# Patient Record
Sex: Female | Born: 1945 | Race: White | Hispanic: No | Smoking: Former smoker
Health system: Southern US, Community
[De-identification: ages and names within clinical notes are randomized; demographics above are authoritative.]

## PROBLEM LIST (undated history)

## (undated) DIAGNOSIS — G459 Transient cerebral ischemic attack, unspecified: Secondary | ICD-10-CM

## (undated) DIAGNOSIS — Z9289 Personal history of other medical treatment: Secondary | ICD-10-CM

## (undated) DIAGNOSIS — M1990 Unspecified osteoarthritis, unspecified site: Secondary | ICD-10-CM

## (undated) DIAGNOSIS — I639 Cerebral infarction, unspecified: Secondary | ICD-10-CM

## (undated) DIAGNOSIS — K3184 Gastroparesis: Secondary | ICD-10-CM

## (undated) DIAGNOSIS — I5032 Chronic diastolic (congestive) heart failure: Secondary | ICD-10-CM

## (undated) DIAGNOSIS — I10 Essential (primary) hypertension: Secondary | ICD-10-CM

## (undated) DIAGNOSIS — F329 Major depressive disorder, single episode, unspecified: Secondary | ICD-10-CM

## (undated) DIAGNOSIS — F419 Anxiety disorder, unspecified: Secondary | ICD-10-CM

## (undated) DIAGNOSIS — F410 Panic disorder [episodic paroxysmal anxiety] without agoraphobia: Secondary | ICD-10-CM

## (undated) DIAGNOSIS — Z8719 Personal history of other diseases of the digestive system: Secondary | ICD-10-CM

## (undated) DIAGNOSIS — K222 Esophageal obstruction: Secondary | ICD-10-CM

## (undated) DIAGNOSIS — E785 Hyperlipidemia, unspecified: Secondary | ICD-10-CM

## (undated) DIAGNOSIS — G4730 Sleep apnea, unspecified: Secondary | ICD-10-CM

## (undated) DIAGNOSIS — N19 Unspecified kidney failure: Secondary | ICD-10-CM

## (undated) DIAGNOSIS — K862 Cyst of pancreas: Secondary | ICD-10-CM

## (undated) DIAGNOSIS — I509 Heart failure, unspecified: Secondary | ICD-10-CM

## (undated) DIAGNOSIS — K219 Gastro-esophageal reflux disease without esophagitis: Secondary | ICD-10-CM

## (undated) DIAGNOSIS — I272 Pulmonary hypertension, unspecified: Secondary | ICD-10-CM

## (undated) DIAGNOSIS — L570 Actinic keratosis: Secondary | ICD-10-CM

## (undated) DIAGNOSIS — K5720 Diverticulitis of large intestine with perforation and abscess without bleeding: Secondary | ICD-10-CM

## (undated) DIAGNOSIS — F32A Depression, unspecified: Secondary | ICD-10-CM

## (undated) DIAGNOSIS — K2270 Barrett's esophagus without dysplasia: Secondary | ICD-10-CM

## (undated) DIAGNOSIS — H3530 Unspecified macular degeneration: Secondary | ICD-10-CM

## (undated) DIAGNOSIS — K8689 Other specified diseases of pancreas: Secondary | ICD-10-CM

## (undated) DIAGNOSIS — C801 Malignant (primary) neoplasm, unspecified: Secondary | ICD-10-CM

## (undated) HISTORY — DX: Depression, unspecified: F32.A

## (undated) HISTORY — DX: Cerebral infarction, unspecified: I63.9

## (undated) HISTORY — DX: Chronic diastolic (congestive) heart failure: I50.32

## (undated) HISTORY — DX: Cyst of pancreas: K86.2

## (undated) HISTORY — DX: Major depressive disorder, single episode, unspecified: F32.9

## (undated) HISTORY — DX: Anxiety disorder, unspecified: F41.9

## (undated) HISTORY — DX: Barrett's esophagus without dysplasia: K22.70

## (undated) HISTORY — DX: Hyperlipidemia, unspecified: E78.5

## (undated) HISTORY — DX: Personal history of other medical treatment: Z92.89

## (undated) HISTORY — DX: Gastro-esophageal reflux disease without esophagitis: K21.9

## (undated) HISTORY — DX: Gastroparesis: K31.84

## (undated) HISTORY — PX: OTHER SURGICAL HISTORY: SHX169

## (undated) HISTORY — DX: Pulmonary hypertension, unspecified: I27.20

## (undated) HISTORY — DX: Actinic keratosis: L57.0

## (undated) HISTORY — DX: Panic disorder (episodic paroxysmal anxiety): F41.0

## (undated) HISTORY — PX: APPENDECTOMY: SHX54

## (undated) HISTORY — DX: Unspecified kidney failure: N19

## (undated) HISTORY — DX: Esophageal obstruction: K22.2

## (undated) HISTORY — DX: Unspecified macular degeneration: H35.30

## (undated) HISTORY — PX: ESOPHAGEAL DILATION: SHX303

## (undated) HISTORY — DX: Transient cerebral ischemic attack, unspecified: G45.9

---

## 1982-02-20 HISTORY — PX: KIDNEY TRANSPLANT: SHX239

## 1997-07-17 ENCOUNTER — Ambulatory Visit (HOSPITAL_COMMUNITY): Admission: RE | Admit: 1997-07-17 | Discharge: 1997-07-17 | Payer: Self-pay | Admitting: Family Medicine

## 1997-09-15 ENCOUNTER — Other Ambulatory Visit: Admission: RE | Admit: 1997-09-15 | Discharge: 1997-09-15 | Payer: Self-pay | Admitting: *Deleted

## 1997-10-22 ENCOUNTER — Emergency Department (HOSPITAL_COMMUNITY): Admission: EM | Admit: 1997-10-22 | Discharge: 1997-10-22 | Payer: Self-pay | Admitting: Emergency Medicine

## 1998-03-26 ENCOUNTER — Encounter: Payer: Self-pay | Admitting: Emergency Medicine

## 1998-03-26 ENCOUNTER — Emergency Department (HOSPITAL_COMMUNITY): Admission: EM | Admit: 1998-03-26 | Discharge: 1998-03-26 | Payer: Self-pay | Admitting: Emergency Medicine

## 1998-06-02 ENCOUNTER — Other Ambulatory Visit: Admission: RE | Admit: 1998-06-02 | Discharge: 1998-06-02 | Payer: Self-pay | Admitting: Family Medicine

## 1998-10-28 ENCOUNTER — Ambulatory Visit (HOSPITAL_COMMUNITY): Admission: RE | Admit: 1998-10-28 | Discharge: 1998-10-28 | Payer: Self-pay | Admitting: *Deleted

## 1998-12-09 ENCOUNTER — Ambulatory Visit (HOSPITAL_COMMUNITY): Admission: RE | Admit: 1998-12-09 | Discharge: 1998-12-09 | Payer: Self-pay | Admitting: Family Medicine

## 1999-08-25 ENCOUNTER — Ambulatory Visit (HOSPITAL_COMMUNITY): Admission: RE | Admit: 1999-08-25 | Discharge: 1999-08-25 | Payer: Self-pay | Admitting: Nephrology

## 1999-08-25 ENCOUNTER — Encounter: Payer: Self-pay | Admitting: Nephrology

## 1999-11-09 ENCOUNTER — Ambulatory Visit (HOSPITAL_COMMUNITY): Admission: RE | Admit: 1999-11-09 | Discharge: 1999-11-09 | Payer: Self-pay | Admitting: Cardiology

## 1999-11-09 ENCOUNTER — Encounter: Payer: Self-pay | Admitting: Cardiology

## 2000-10-26 ENCOUNTER — Encounter: Payer: Self-pay | Admitting: Family Medicine

## 2000-10-26 ENCOUNTER — Ambulatory Visit (HOSPITAL_COMMUNITY): Admission: RE | Admit: 2000-10-26 | Discharge: 2000-10-26 | Payer: Self-pay | Admitting: Family Medicine

## 2001-03-23 ENCOUNTER — Encounter (INDEPENDENT_AMBULATORY_CARE_PROVIDER_SITE_OTHER): Payer: Self-pay | Admitting: Specialist

## 2001-03-23 ENCOUNTER — Encounter: Payer: Self-pay | Admitting: Emergency Medicine

## 2001-03-23 ENCOUNTER — Inpatient Hospital Stay (HOSPITAL_COMMUNITY): Admission: EM | Admit: 2001-03-23 | Discharge: 2001-03-25 | Payer: Self-pay

## 2001-04-16 ENCOUNTER — Ambulatory Visit (HOSPITAL_COMMUNITY): Admission: RE | Admit: 2001-04-16 | Discharge: 2001-04-16 | Payer: Self-pay | Admitting: General Surgery

## 2001-04-16 ENCOUNTER — Encounter: Payer: Self-pay | Admitting: General Surgery

## 2001-05-27 ENCOUNTER — Ambulatory Visit (HOSPITAL_COMMUNITY): Admission: RE | Admit: 2001-05-27 | Discharge: 2001-05-27 | Payer: Self-pay | Admitting: Family Medicine

## 2001-05-27 ENCOUNTER — Encounter: Payer: Self-pay | Admitting: Gastroenterology

## 2001-06-04 ENCOUNTER — Inpatient Hospital Stay (HOSPITAL_COMMUNITY): Admission: AD | Admit: 2001-06-04 | Discharge: 2001-06-10 | Payer: Self-pay | Admitting: Nephrology

## 2001-06-04 ENCOUNTER — Encounter: Payer: Self-pay | Admitting: Nephrology

## 2001-07-19 ENCOUNTER — Ambulatory Visit (HOSPITAL_COMMUNITY): Admission: RE | Admit: 2001-07-19 | Discharge: 2001-07-19 | Payer: Self-pay | Admitting: Family Medicine

## 2001-09-09 ENCOUNTER — Ambulatory Visit (HOSPITAL_COMMUNITY): Admission: RE | Admit: 2001-09-09 | Discharge: 2001-09-09 | Payer: Self-pay | Admitting: Gastroenterology

## 2001-09-09 ENCOUNTER — Encounter: Payer: Self-pay | Admitting: Gastroenterology

## 2001-10-10 ENCOUNTER — Ambulatory Visit: Admission: RE | Admit: 2001-10-10 | Discharge: 2001-10-10 | Payer: Self-pay | Admitting: Internal Medicine

## 2001-10-13 ENCOUNTER — Emergency Department (HOSPITAL_COMMUNITY): Admission: EM | Admit: 2001-10-13 | Discharge: 2001-10-14 | Payer: Self-pay | Admitting: Emergency Medicine

## 2001-10-14 ENCOUNTER — Encounter: Payer: Self-pay | Admitting: Emergency Medicine

## 2001-10-16 ENCOUNTER — Ambulatory Visit (HOSPITAL_COMMUNITY): Admission: RE | Admit: 2001-10-16 | Discharge: 2001-10-16 | Payer: Self-pay | Admitting: Internal Medicine

## 2001-11-21 ENCOUNTER — Encounter: Payer: Self-pay | Admitting: Nephrology

## 2001-11-21 ENCOUNTER — Inpatient Hospital Stay (HOSPITAL_COMMUNITY): Admission: AD | Admit: 2001-11-21 | Discharge: 2001-11-23 | Payer: Self-pay | Admitting: Nephrology

## 2001-12-09 ENCOUNTER — Encounter: Payer: Self-pay | Admitting: Nephrology

## 2001-12-09 ENCOUNTER — Encounter: Admission: RE | Admit: 2001-12-09 | Discharge: 2001-12-09 | Payer: Self-pay | Admitting: Nephrology

## 2002-01-07 ENCOUNTER — Other Ambulatory Visit: Admission: RE | Admit: 2002-01-07 | Discharge: 2002-01-07 | Payer: Self-pay | Admitting: Family Medicine

## 2002-04-04 ENCOUNTER — Ambulatory Visit (HOSPITAL_COMMUNITY): Admission: RE | Admit: 2002-04-04 | Discharge: 2002-04-04 | Payer: Self-pay | Admitting: Gastroenterology

## 2002-04-04 ENCOUNTER — Encounter: Payer: Self-pay | Admitting: Gastroenterology

## 2002-06-11 ENCOUNTER — Encounter: Admission: RE | Admit: 2002-06-11 | Discharge: 2002-06-11 | Payer: Self-pay | Admitting: Nephrology

## 2002-06-11 ENCOUNTER — Encounter: Payer: Self-pay | Admitting: Nephrology

## 2002-09-24 ENCOUNTER — Encounter: Payer: Self-pay | Admitting: Nephrology

## 2002-09-24 ENCOUNTER — Encounter: Admission: RE | Admit: 2002-09-24 | Discharge: 2002-09-24 | Payer: Self-pay | Admitting: Nephrology

## 2002-10-07 ENCOUNTER — Encounter: Payer: Self-pay | Admitting: Gastroenterology

## 2002-10-07 ENCOUNTER — Ambulatory Visit (HOSPITAL_COMMUNITY): Admission: RE | Admit: 2002-10-07 | Discharge: 2002-10-07 | Payer: Self-pay | Admitting: Gastroenterology

## 2002-11-01 ENCOUNTER — Encounter: Payer: Self-pay | Admitting: Nephrology

## 2002-11-01 ENCOUNTER — Ambulatory Visit (HOSPITAL_COMMUNITY): Admission: RE | Admit: 2002-11-01 | Discharge: 2002-11-01 | Payer: Self-pay | Admitting: Nephrology

## 2002-12-08 ENCOUNTER — Encounter: Payer: Self-pay | Admitting: Occupational Therapy

## 2002-12-08 ENCOUNTER — Ambulatory Visit (HOSPITAL_COMMUNITY): Admission: RE | Admit: 2002-12-08 | Discharge: 2002-12-08 | Payer: Self-pay | Admitting: Family Medicine

## 2003-01-24 ENCOUNTER — Observation Stay (HOSPITAL_COMMUNITY): Admission: EM | Admit: 2003-01-24 | Discharge: 2003-01-24 | Payer: Self-pay | Admitting: Emergency Medicine

## 2003-02-27 ENCOUNTER — Encounter: Admission: RE | Admit: 2003-02-27 | Discharge: 2003-05-28 | Payer: Self-pay | Admitting: Orthopedic Surgery

## 2003-04-15 ENCOUNTER — Ambulatory Visit (HOSPITAL_COMMUNITY): Admission: RE | Admit: 2003-04-15 | Discharge: 2003-04-15 | Payer: Self-pay | Admitting: Gastroenterology

## 2003-05-07 ENCOUNTER — Encounter: Payer: Self-pay | Admitting: Gastroenterology

## 2003-09-16 ENCOUNTER — Ambulatory Visit (HOSPITAL_BASED_OUTPATIENT_CLINIC_OR_DEPARTMENT_OTHER): Admission: RE | Admit: 2003-09-16 | Discharge: 2003-09-16 | Payer: Self-pay | Admitting: Orthopedic Surgery

## 2003-09-16 ENCOUNTER — Ambulatory Visit (HOSPITAL_COMMUNITY): Admission: RE | Admit: 2003-09-16 | Discharge: 2003-09-16 | Payer: Self-pay | Admitting: Orthopedic Surgery

## 2003-12-14 ENCOUNTER — Ambulatory Visit: Payer: Self-pay | Admitting: Family Medicine

## 2003-12-24 ENCOUNTER — Ambulatory Visit (HOSPITAL_COMMUNITY): Admission: RE | Admit: 2003-12-24 | Discharge: 2003-12-24 | Payer: Self-pay | Admitting: Family Medicine

## 2003-12-28 ENCOUNTER — Ambulatory Visit: Payer: Self-pay | Admitting: Family Medicine

## 2004-02-26 ENCOUNTER — Ambulatory Visit: Payer: Self-pay | Admitting: Internal Medicine

## 2004-04-19 ENCOUNTER — Ambulatory Visit (HOSPITAL_COMMUNITY): Admission: RE | Admit: 2004-04-19 | Discharge: 2004-04-19 | Payer: Self-pay | Admitting: Gastroenterology

## 2004-05-12 ENCOUNTER — Ambulatory Visit: Payer: Self-pay | Admitting: Gastroenterology

## 2004-05-25 ENCOUNTER — Ambulatory Visit: Payer: Self-pay | Admitting: Gastroenterology

## 2004-06-16 ENCOUNTER — Ambulatory Visit: Payer: Self-pay | Admitting: Gastroenterology

## 2004-10-16 ENCOUNTER — Encounter: Admission: RE | Admit: 2004-10-16 | Discharge: 2004-10-16 | Payer: Self-pay | Admitting: Nephrology

## 2004-10-28 ENCOUNTER — Ambulatory Visit (HOSPITAL_COMMUNITY): Admission: RE | Admit: 2004-10-28 | Discharge: 2004-10-28 | Payer: Self-pay | Admitting: Nephrology

## 2004-12-15 ENCOUNTER — Encounter: Admission: RE | Admit: 2004-12-15 | Discharge: 2004-12-15 | Payer: Self-pay | Admitting: Neurology

## 2005-01-23 ENCOUNTER — Ambulatory Visit (HOSPITAL_COMMUNITY): Admission: RE | Admit: 2005-01-23 | Discharge: 2005-01-23 | Payer: Self-pay | Admitting: Family Medicine

## 2005-04-18 ENCOUNTER — Ambulatory Visit: Payer: Self-pay | Admitting: Family Medicine

## 2005-04-27 ENCOUNTER — Ambulatory Visit: Payer: Self-pay | Admitting: Gastroenterology

## 2005-05-09 ENCOUNTER — Ambulatory Visit: Payer: Self-pay | Admitting: Gastroenterology

## 2005-05-09 ENCOUNTER — Encounter (INDEPENDENT_AMBULATORY_CARE_PROVIDER_SITE_OTHER): Payer: Self-pay | Admitting: *Deleted

## 2006-02-05 ENCOUNTER — Ambulatory Visit (HOSPITAL_COMMUNITY): Admission: RE | Admit: 2006-02-05 | Discharge: 2006-02-05 | Payer: Self-pay | Admitting: Nephrology

## 2007-02-06 ENCOUNTER — Encounter: Admission: RE | Admit: 2007-02-06 | Discharge: 2007-02-06 | Payer: Self-pay | Admitting: Nephrology

## 2007-02-06 ENCOUNTER — Encounter: Payer: Self-pay | Admitting: Gastroenterology

## 2007-02-22 ENCOUNTER — Ambulatory Visit (HOSPITAL_COMMUNITY): Admission: RE | Admit: 2007-02-22 | Discharge: 2007-02-22 | Payer: Self-pay | Admitting: Nephrology

## 2007-03-04 ENCOUNTER — Ambulatory Visit: Payer: Self-pay | Admitting: Gastroenterology

## 2007-03-04 LAB — CONVERTED CEMR LAB: CEA: 12.4 ng/mL — ABNORMAL HIGH (ref 0.0–5.0)

## 2007-03-07 ENCOUNTER — Ambulatory Visit: Payer: Self-pay | Admitting: Gastroenterology

## 2007-03-07 ENCOUNTER — Encounter: Payer: Self-pay | Admitting: Gastroenterology

## 2007-03-21 ENCOUNTER — Ambulatory Visit (HOSPITAL_COMMUNITY): Admission: RE | Admit: 2007-03-21 | Discharge: 2007-03-21 | Payer: Self-pay | Admitting: Gastroenterology

## 2007-03-21 ENCOUNTER — Encounter: Payer: Self-pay | Admitting: Gastroenterology

## 2007-03-27 ENCOUNTER — Ambulatory Visit: Payer: Self-pay | Admitting: Gastroenterology

## 2007-04-24 ENCOUNTER — Ambulatory Visit: Payer: Self-pay | Admitting: Gastroenterology

## 2007-09-04 ENCOUNTER — Encounter: Admission: RE | Admit: 2007-09-04 | Discharge: 2007-09-04 | Payer: Self-pay | Admitting: Nephrology

## 2008-01-04 ENCOUNTER — Encounter: Admission: RE | Admit: 2008-01-04 | Discharge: 2008-01-04 | Payer: Self-pay | Admitting: Neurology

## 2008-01-07 ENCOUNTER — Encounter: Admission: RE | Admit: 2008-01-07 | Discharge: 2008-02-05 | Payer: Self-pay | Admitting: Neurology

## 2008-01-21 ENCOUNTER — Telehealth: Payer: Self-pay | Admitting: Gastroenterology

## 2008-01-22 DIAGNOSIS — D490 Neoplasm of unspecified behavior of digestive system: Secondary | ICD-10-CM

## 2008-02-21 HISTORY — PX: COLON RESECTION: SHX5231

## 2008-02-26 ENCOUNTER — Encounter
Admission: RE | Admit: 2008-02-26 | Discharge: 2008-02-26 | Payer: Self-pay | Admitting: Physical Medicine and Rehabilitation

## 2008-02-28 ENCOUNTER — Ambulatory Visit: Payer: Self-pay | Admitting: Cardiology

## 2008-04-15 ENCOUNTER — Encounter (INDEPENDENT_AMBULATORY_CARE_PROVIDER_SITE_OTHER): Payer: Self-pay | Admitting: *Deleted

## 2008-05-06 ENCOUNTER — Ambulatory Visit: Payer: Self-pay | Admitting: Gastroenterology

## 2008-05-06 DIAGNOSIS — I6350 Cerebral infarction due to unspecified occlusion or stenosis of unspecified cerebral artery: Secondary | ICD-10-CM | POA: Insufficient documentation

## 2008-05-06 DIAGNOSIS — K2270 Barrett's esophagus without dysplasia: Secondary | ICD-10-CM | POA: Insufficient documentation

## 2008-05-06 DIAGNOSIS — R110 Nausea: Secondary | ICD-10-CM

## 2008-05-07 ENCOUNTER — Telehealth: Payer: Self-pay | Admitting: Gastroenterology

## 2008-05-12 ENCOUNTER — Encounter: Payer: Self-pay | Admitting: Gastroenterology

## 2008-05-19 ENCOUNTER — Ambulatory Visit (HOSPITAL_COMMUNITY): Admission: RE | Admit: 2008-05-19 | Discharge: 2008-05-19 | Payer: Self-pay | Admitting: Gastroenterology

## 2008-05-21 ENCOUNTER — Ambulatory Visit: Payer: Self-pay | Admitting: Gastroenterology

## 2008-05-21 ENCOUNTER — Encounter: Payer: Self-pay | Admitting: Gastroenterology

## 2008-05-27 ENCOUNTER — Encounter: Payer: Self-pay | Admitting: Gastroenterology

## 2008-06-11 ENCOUNTER — Telehealth: Payer: Self-pay | Admitting: Gastroenterology

## 2008-06-24 ENCOUNTER — Ambulatory Visit: Payer: Self-pay | Admitting: Gastroenterology

## 2008-06-24 DIAGNOSIS — K3184 Gastroparesis: Secondary | ICD-10-CM

## 2008-06-25 ENCOUNTER — Ambulatory Visit (HOSPITAL_COMMUNITY): Admission: RE | Admit: 2008-06-25 | Discharge: 2008-06-25 | Payer: Self-pay | Admitting: Nephrology

## 2008-06-30 ENCOUNTER — Encounter: Admission: RE | Admit: 2008-06-30 | Discharge: 2008-06-30 | Payer: Self-pay | Admitting: Nephrology

## 2008-08-10 ENCOUNTER — Encounter: Payer: Self-pay | Admitting: Gastroenterology

## 2008-09-05 ENCOUNTER — Inpatient Hospital Stay (HOSPITAL_COMMUNITY): Admission: EM | Admit: 2008-09-05 | Discharge: 2008-09-13 | Payer: Self-pay | Admitting: Emergency Medicine

## 2008-09-05 ENCOUNTER — Encounter (INDEPENDENT_AMBULATORY_CARE_PROVIDER_SITE_OTHER): Payer: Self-pay | Admitting: General Surgery

## 2008-10-13 ENCOUNTER — Encounter: Payer: Self-pay | Admitting: Gastroenterology

## 2009-02-08 ENCOUNTER — Encounter: Payer: Self-pay | Admitting: Gastroenterology

## 2009-02-20 HISTORY — PX: COLOSTOMY: SHX63

## 2009-03-05 ENCOUNTER — Encounter (INDEPENDENT_AMBULATORY_CARE_PROVIDER_SITE_OTHER): Payer: Self-pay | Admitting: General Surgery

## 2009-03-05 ENCOUNTER — Inpatient Hospital Stay (HOSPITAL_COMMUNITY): Admission: RE | Admit: 2009-03-05 | Discharge: 2009-03-09 | Payer: Self-pay | Admitting: General Surgery

## 2009-03-12 ENCOUNTER — Encounter: Payer: Self-pay | Admitting: Gastroenterology

## 2009-04-05 ENCOUNTER — Encounter: Payer: Self-pay | Admitting: Gastroenterology

## 2009-04-06 ENCOUNTER — Telehealth: Payer: Self-pay | Admitting: Gastroenterology

## 2009-04-20 ENCOUNTER — Encounter: Payer: Self-pay | Admitting: Gastroenterology

## 2009-04-22 ENCOUNTER — Telehealth: Payer: Self-pay | Admitting: Gastroenterology

## 2009-04-23 ENCOUNTER — Ambulatory Visit: Payer: Self-pay | Admitting: Cardiology

## 2009-04-23 ENCOUNTER — Ambulatory Visit: Payer: Self-pay | Admitting: Gastroenterology

## 2009-04-23 DIAGNOSIS — K219 Gastro-esophageal reflux disease without esophagitis: Secondary | ICD-10-CM | POA: Insufficient documentation

## 2009-04-23 DIAGNOSIS — R109 Unspecified abdominal pain: Secondary | ICD-10-CM

## 2009-04-23 DIAGNOSIS — R197 Diarrhea, unspecified: Secondary | ICD-10-CM | POA: Insufficient documentation

## 2009-04-23 DIAGNOSIS — R634 Abnormal weight loss: Secondary | ICD-10-CM

## 2009-04-26 ENCOUNTER — Telehealth: Payer: Self-pay | Admitting: Nurse Practitioner

## 2009-04-27 ENCOUNTER — Encounter: Payer: Self-pay | Admitting: Nurse Practitioner

## 2009-04-28 ENCOUNTER — Encounter: Payer: Self-pay | Admitting: Nurse Practitioner

## 2009-04-29 ENCOUNTER — Encounter: Payer: Self-pay | Admitting: Gastroenterology

## 2009-04-30 ENCOUNTER — Encounter: Admission: RE | Admit: 2009-04-30 | Discharge: 2009-04-30 | Payer: Self-pay | Admitting: General Surgery

## 2009-05-05 ENCOUNTER — Ambulatory Visit: Payer: Self-pay | Admitting: Gastroenterology

## 2009-05-05 DIAGNOSIS — K5732 Diverticulitis of large intestine without perforation or abscess without bleeding: Secondary | ICD-10-CM | POA: Insufficient documentation

## 2009-06-28 ENCOUNTER — Encounter: Admission: RE | Admit: 2009-06-28 | Discharge: 2009-06-28 | Payer: Self-pay | Admitting: Nephrology

## 2009-07-30 ENCOUNTER — Telehealth: Payer: Self-pay | Admitting: Nurse Practitioner

## 2009-08-20 ENCOUNTER — Encounter: Payer: Self-pay | Admitting: Gastroenterology

## 2009-11-21 ENCOUNTER — Encounter: Payer: Self-pay | Admitting: Gastroenterology

## 2009-12-22 ENCOUNTER — Encounter: Payer: Self-pay | Admitting: Gastroenterology

## 2010-03-12 ENCOUNTER — Encounter: Payer: Self-pay | Admitting: Gastroenterology

## 2010-03-13 ENCOUNTER — Encounter: Payer: Self-pay | Admitting: Nephrology

## 2010-03-14 ENCOUNTER — Encounter: Payer: Self-pay | Admitting: Nephrology

## 2010-03-24 NOTE — Progress Notes (Signed)
Summary: Omeprazole approved for 1 year  Phone Note Refill Request Call back at Home Phone [phone] Message from:  Fax from Pharmacy on April 06, 2009 9:20 AM  PT APPROVED FPR OMEPRAZOLE  EFFECTIVE DATE 04/05/2009 TO 04/05/2010  FROM [facility].  SENT FORMS AND APPROVAL TO BE SCANNED IN  Initial call taken by: Merri Ray CMA (AAMA),  April 06, 2009 9:21 AM

## 2010-03-24 NOTE — Letter (Signed)
Summary: [facility]   Imported By: Sherian Rein 05/10/2009 14:04:34  _____________________________________________________________________  External Attachment:    Type:   Image     Comment:   External Document

## 2010-03-24 NOTE — Assessment & Plan Note (Signed)
Summary: F/U  CT ABD, ABD pain, saw NP   History of Present Illness Visit Type: Follow-up Visit Primary GI MD: Melvia Heaps MD [facility] Primary Provider: Elvis Coil, MD Requesting Provider: n/a Chief Complaint: F/u for CT and abd pain. Pt states that she feels better but still having lower abd pain  History of Present Illness:   Morgan Cooper has returned for followup of her lower abdominal pain and diarrhea.  She was seen on April 22, 2009 because of fever,  diarrhea and lower abdomina lpain.  She was placed on Cipro with resolution of the diarrhea and significant improvement of her pain.  CT Scan showed minimal increase in the size of the pancreatic cystic neoplasm.  There was mild sigmoid wall thickening and inflammatory stranding.  At this time she is feeling well except for very mild intermittent left lower quadrant pain.   GI Review of Systems    Reports abdominal pain.     Location of  Abdominal pain: lower abdomen.    Denies acid reflux, belching, bloating, chest pain, dysphagia with liquids, dysphagia with solids, heartburn, loss of appetite, nausea, vomiting, vomiting blood, weight loss, and  weight gain.        Denies anal fissure, black tarry stools, change in bowel habit, constipation, diarrhea, diverticulosis, fecal incontinence, heme positive stool, hemorrhoids, irritable bowel syndrome, jaundice, light color stool, liver problems, rectal bleeding, and  rectal pain.    Current Medications (verified): 1)  Azathioprine 50 Mg Tabs (Azathioprine) .... Take 1 Daily 2)  Prednisone 5 Mg Tabs (Prednisone) .... One and 1/2 Tablet By Mouth Once Daily 3)  Prozac 20 Mg Caps (Fluoxetine Hcl) .... Take 2 Daily 4)  Lipitor 20 Mg Tabs (Atorvastatin Calcium) .... Take 1 Daily 5)  Metoprolol Tartrate 50 Mg Tabs (Metoprolol Tartrate) .... Take 1/2 Tab Two Times A Day 6)  Doxepin Hcl 25 Mg Caps (Doxepin Hcl) .... Take 4 Daily 7)  Lasix 40 Mg Tabs (Furosemide) .... 1/2 Twice Daily 8)  Verapamil Hcl  Cr 120 Mg Xr24h-Cap (Verapamil Hcl) .... Take 1 Daily 9)  Fosamax 70 Mg Tabs (Alendronate Sodium) .... Take 1 Tab Weekly (Holdiing Since July) 10)  Aspirin Ec 325 Mg Tbec (Aspirin) .... Once Daily 11)  Restasis 0.05 % Emul (Cyclosporine) .... Use As Directed 12)  Lac-Hydrin 12 % Lotn (Ammonium Lactate) .... Use As Directed 13)  Ocuvite  Tabs (Multiple Vitamins-Minerals) .... Once Daily 14)  Bentyl 10 Mg Caps (Dicyclomine Hcl) .... Take 2 Times Daily 15)  Nexium 40 Mg Cpdr (Esomeprazole Magnesium) .... Take 1 Cap 30 Min Before Breakfast  Allergies (verified): No Known Drug Allergies  Past History:  Past Medical History: Reviewed history from 04/23/2009 and no changes required. Pancreatic Serous Cystadenoma Barrett's Esophagus Esophageal Stricture Renal Failiure - S/P transplant CVA - "minny strokes" Diverticulitis  Past Surgical History: Reviewed history from 04/23/2009 and no changes required. Renal Transplant Appendectomy Broken Arm Colon Resection with Colostomy 08/2008 Colostomy reversal January 2011  Family History: Reviewed history from 04/23/2009 and no changes required. Family History of Colon Polyps:Patient Family History of Diabetes: Sister Family History of Kidney Disease:Patient No FH of Colon Cancer:  Social History: Reviewed history from 05/06/2008 and no changes required. Occupation: Retired Patient is a former smoker.  Alcohol Use - no Daily Caffeine Use-Coffee daily Illicit Drug Use - no Patient does not get regular exercise.   Review of Systems  The patient denies allergy/sinus, anemia, anxiety-new, arthritis/joint pain, back pain, blood in urine, breast changes/lumps,  change in vision, confusion, cough, coughing up blood, depression-new, fainting, fatigue, fever, headaches-new, hearing problems, heart murmur, heart rhythm changes, itching, menstrual pain, muscle pains/cramps, night sweats, nosebleeds, pregnancy symptoms, shortness of breath, skin  rash, sleeping problems, sore throat, swelling of feet/legs, swollen lymph glands, thirst - excessive , urination - excessive , urination changes/pain, urine leakage, vision changes, and voice change.    Vital Signs:  Patient profile:   65 year old female Height:      65 inches Weight:      129 pounds BMI:     21.54 BSA:     1.64 Pulse rate:   60 / minute Pulse rhythm:   regular BP sitting:   120 / 64  (left arm) Cuff size:   regular  Vitals Entered By: Ok Anis CMA (May 05, 2009 10:22 AM)  Physical Exam  Additional Exam:  She is a thin female in no acute distress  Abdomen is without masses, tenderness or organomegaly   Impression & Recommendations:  Problem # 1:  DIARRHEA-PRESUMED INFECTIOUS (ICD-009.3) Assessment Improved  Problem # 2:  ABDOMINAL PAIN-MULTIPLE SITES (ICD-789.09) Assessment: Improved There is evidence for mild inflammation in the area the sigmoid colon by recent CT raising the question of possible low-grade diverticulitis or perhaps ischemic colitis.  This problem does appear to be resolving.  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  Plan follow CT in one year  Problem # 6:  DIVERTICULITIS, COLON, WITH PERFORATION (ICD-562.11) history of acute diverticulitis complicated by perforation in July, 2010.  Diverting colostomy taken down in January, 2011.  Patient Instructions: 1)  We are sending in a prescription to your pharmacy today 2)  The medication list was reviewed and reconciled.  All changed / newly prescribed medications were explained.  A complete medication list was provided to the patient / caregiver. 3)  CC Dr. Elvis Coil Prescriptions: HYOMAX-SL 0.125 MG SUBL (HYOSCYAMINE  SULFATE) take 2 tabs sublingual q.4 h. p.r.n.  #15 x 1   Entered and Authorized by:   Louis Meckel MD   Signed by:   Louis Meckel MD on 05/05/2009   Method used:   Electronically to        [facility] (retail)       [address]       [place], [place]  [address]       Ph: [phone] or [phone]       Fax: [phone]   RxID:   [id number]

## 2010-03-24 NOTE — Progress Notes (Signed)
Summary: Triage  Phone Note Call from Patient Call back at Home Phone [phone]   Caller: Patient Call For: Willette Cluster Reason for Call: Talk to Nurse Summary of Call: Pt called back to give an update from Friday. She is feeling better, no diarrhea Initial call taken by: Karna Christmas,  April 26, 2009 9:59 AM  Follow-up for Phone Call        Called and LM for pt to advise I got the message she was feeling better and has no diarrhea.  I also advised that when Gunnar Fusi reviews the CT scan she will notify me with what to call the CT scan showed.   Follow-up by: Joselyn Glassman,  April 26, 2009 11:23 AM     Appended Document: Triage Glad she is better. Did she submit stool studies?  Continue Cipro. If stool shows C-Diff then we will add Flagyl. I recieved call from radiology while patient was there getting CTscan of abdomen Friday. Because of extensive GI history (colon perforation / colostomy and takedown in January) I added pelvis to the scan. Please tell patient the inflammatory changes are still there but are resolving. As far as pancreatic cyst findings, I will forward this to Dr. Arlyce Dice for his input . Thanks. Please make sure she has follow up with Dr. Arlyce Dice.  Appended Document: Triage Pt has appt with Dr. Arlyce Dice on 05-05-09.

## 2010-03-24 NOTE — Medication Information (Signed)
Summary: Omeprazole/[facility]  Omeprazole/[facility]   Imported By: Sherian Rein 04/21/2009 09:08:32  _____________________________________________________________________  External Attachment:    Type:   Image     Comment:   External Document

## 2010-03-24 NOTE — Letter (Signed)
Summary: [facility]  [facility]   Imported By: Sherian Rein 03/18/2009 15:09:40  _____________________________________________________________________  External Attachment:    Type:   Image     Comment:   External Document

## 2010-03-24 NOTE — Progress Notes (Signed)
Summary: Prior auth for Nexium  Phone Note Outgoing Call   Call placed by: Joselyn Glassman,  July 30, 2009 4:39 PM Call placed to: Patient Summary of Call: LM for pt asking if she has tried other PPI's.  I am trying to do a Prior Authorization for Nexium. Initial call taken by: Joselyn Glassman,  July 30, 2009 4:40 PM  Follow-up for Phone Call        Called the 704 exchange number and the daughter answered.  She does help her mother with some of her medical affairs but the daughter said her mother knows what PPI's she has tried. Follow-up by: Joselyn Glassman,  August 02, 2009 9:27 AM  Additional Follow-up for Phone Call Additional follow up Details #1::        I spoke to [facility] and she said she tried Omeprazole 20 mg two times a day before and it worked fine.  She just got a letter from [place] stating she is approved to get Omeprazole 20 mg twice daily. I told her I will send the prescription to [facility] for this. Pt fine with that. Additional Follow-up by: Joselyn Glassman,  August 02, 2009 1:46 PM    New/Updated Medications: OMEPRAZOLE 20 MG CPDR (OMEPRAZOLE) Take 1 tab twice daily Prescriptions: OMEPRAZOLE 20 MG CPDR (OMEPRAZOLE) Take 1 tab twice daily  #60 x 6   Entered by:   Lowry Ram NCMA   Authorized by:   Willette Cluster NP   Signed by:   Lowry Ram NCMA on 08/02/2009   Method used:   Electronically to        [facility] (retail)       [address]       [place], [place]  [address]       Ph: [phone] or [phone]       Fax: [phone]   RxID:   [id number]

## 2010-03-24 NOTE — Medication Information (Signed)
Summary: Approved/[facility]  Approved/[facility]   Imported By: Lester Beacon 04/09/2009 10:24:59  _____________________________________________________________________  External Attachment:    Type:   Image     Comment:   External Document

## 2010-03-24 NOTE — Assessment & Plan Note (Signed)
Summary: ? Diverticular Flare/lots of diarrhea Arlyce Dice pt)   History of Present Illness Visit Type: Follow-up Visit Primary GI MD: Melvia Heaps MD [facility] Primary Provider: Elvis Coil, MD Chief Complaint: diverticular flare with diarrhea, fever and chills  also c/o dyspharia and alot of indigestion History of Present Illness:   Patient is a 65 year old female with extensive past medical history including renal transplant, heart failure, hypertension and bowel perforation from diverticulitis which required colostomy July 2010. She is s/p takedown Jan. 2011. Patient followed by Dr. Arlyce Dice for pancreatic cyst for which she has been getting surveillance CTscans . Dr. Marzetta Board medical assistant called patient yesterday to schedule next CTscan. During phone call patient complained of low grade fevers, mild diarrhea, lower abdominal cramps and generally feeling poor. After colostomy takedown in January had stomach bug but then recovered. Her current symptoms started two weeks ago. Initially was having 10-15 stools a day but that has significantly decreased. Her symptoms are overall better but still having some cramps and feels tired. Routine check up with Dr. Hyman Hopes (nephrologist) yesterday. He gave her a prescription for Cipro to take twice daily for 10 days. Complains of chills but not the kind of chills associated with fever.   Complains of heartburn, reflux. Omeprazole does not help. She is no longer on Reglan, she doesn't have any significant nausea.   GI Review of Systems    Reports abdominal pain, heartburn, loss of appetite, nausea, and  weight loss.     Location of  Abdominal pain: lower abdomen. Weight loss of several pounds pounds over a month.   Denies acid reflux, belching, bloating, chest pain, dysphagia with liquids, dysphagia with solids, vomiting, vomiting blood, and  weight gain.      Reports diarrhea.     Denies anal fissure, black tarry stools, change in bowel habit, constipation,  diverticulosis, fecal incontinence, heme positive stool, hemorrhoids, irritable bowel syndrome, jaundice, light color stool, liver problems, rectal bleeding, and  rectal pain.    Current Medications (verified): 1)  Omeprazole 40 Mg Cpdr (Omeprazole) .Marland Kitchen.. 1 Capsule By Mouth At Bedtime 2)  Azathioprine 50 Mg Tabs (Azathioprine) .... Take 1 Daily 3)  Prednisone 5 Mg Tabs (Prednisone) .... Take 11/2 Daily 4)  Prozac 20 Mg Caps (Fluoxetine Hcl) .... Take 2 Daily 5)  Lipitor 20 Mg Tabs (Atorvastatin Calcium) .... Take 1 Daily 6)  Metoprolol Tartrate 50 Mg Tabs (Metoprolol Tartrate) .... Take 1/2 Tab Two Times A Day 7)  Doxepin Hcl 25 Mg Caps (Doxepin Hcl) .... Take 4 Daily 8)  Lasix 40 Mg Tabs (Furosemide) .... 1/2 Twice Daily 9)  Verapamil Hcl Cr 120 Mg Xr24h-Cap (Verapamil Hcl) .... Take 1 Daily 10)  Fosamax 70 Mg Tabs (Alendronate Sodium) .... Take 1 Tab Weekly (Holdiing Since July) 11)  Metoclopramide Hcl 10 Mg Tabs (Metoclopramide Hcl) .... Take One By Mouth 30 Minutes Before Breakfast,lunch,dinner and At Bedtime. 12)  Aspirin Ec 325 Mg Tbec (Aspirin) .... Once Daily 13)  Restasis 0.05 % Emul (Cyclosporine) .... Use As Directed 14)  Lac-Hydrin 12 % Lotn (Ammonium Lactate) .... Use As Directed 15)  Ocuvite  Tabs (Multiple Vitamins-Minerals) .... Once Daily 16)  Cipro 500 Mg Tabs (Ciprofloxacin Hcl) .Marland Kitchen.. 1 Tablet By Mouth Two Times A Day  Allergies (verified): No Known Drug Allergies  Past History:  Past Medical History: Pancreatic Serous Cystadenoma Barrett's Esophagus Esophageal Stricture Renal Failiure - S/P transplant CVA - "minny strokes" Diverticulitis  Past Surgical History: Renal Transplant Appendectomy Broken Arm Colon  Resection with Colostomy 08/2008 Colostomy reversal January 2011  Family History: Reviewed history from 05/06/2008 and no changes required. Family History of Colon Polyps:Patient Family History of Diabetes: Sister Family History of Kidney  Disease:Patient No FH of Colon Cancer:  Social History: Reviewed history from 05/06/2008 and no changes required. Occupation: Retired Patient is a former smoker.  Alcohol Use - no Daily Caffeine Use-Coffee daily Illicit Drug Use - no Patient does not get regular exercise.   Review of Systems       The patient complains of fatigue, fever, and headaches-new.  The patient denies allergy/sinus, anemia, anxiety-new, arthritis/joint pain, back pain, blood in urine, breast changes/lumps, change in vision, confusion, cough, coughing up blood, depression-new, fainting, hearing problems, heart murmur, heart rhythm changes, itching, menstrual pain, muscle pains/cramps, night sweats, nosebleeds, pregnancy symptoms, shortness of breath, skin rash, sore throat, swelling of feet/legs, swollen lymph glands, thirst - excessive , urination - excessive , urination changes/pain, urine leakage, vision changes, and voice change.    Vital Signs:  Patient profile:   65 year old female Height:      65 inches Weight:      130 pounds BMI:     21.71 Temp:     98.5 degrees F oral Pulse rate:   64 / minute Pulse rhythm:   regular BP sitting:   130 / 86  (left arm)  Vitals Entered By: Milford Cage NCMA (April 23, 2009 9:22 AM)  Physical Exam  General:  Well developed, well nourished, no acute distress. Head:  Normocephalic and atraumatic. Eyes:  Conjunctiva pink, no icterus.  Mouth:  No oral lesions. Tongue moist.  Neck:  no obvious masses  Lungs:  Clear throughout to auscultation. Heart:  Regular rate and rhythm; no murmurs, rubs,  or bruits. Abdomen:  Abdomen soft, nontender, nondistended. No obvious masses or hepatomegaly.Normal bowel sounds.  Msk:  Symmetrical with no gross deformities. Extremities:  No palmar erythema, no edema.  Neurologic:  Alert and  oriented x4;  grossly normal neurologically. Skin:  Intact without significant lesions or rashes. Cervical Nodes:  No significant cervical  adenopathy. Psych:  Alert and cooperative. Normal mood and affect.   Impression & Recommendations:  Problem # 1:  DIARRHEA-PRESUMED INFECTIOUS (ICD-009.3) Assessment New Two week history of lower abdominal cramps, diarrhea, low grade temps. Will check stool studes. Rule out infectious etiolgy in this immunocomprimised patient. Her abdominal exam is benign. Just had labs with Nephrologist and I am awaiting those results. Treat empirically with Cipro (which she  has just started ) and add Flagyl. Bentyl for cramps. If stool studies postitive for C-diff will stop Cipro. Follow up with Dr. Arlyce Dice but in meantime, call if symptoms worsen, develops high fevers, nausea / vomiting.   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   Orders: CT Abdomen (CT Abd)  Problem # 4:  GERD (ICD-530.81) Assessment: Comment Only Complains of heartburn, Omeprazole not helping. Trial of Protonix, given anti-reflux measures literature.  Patient Instructions: 1)  We have removed reglan from  your med list. 2)  We have given you antireflux measures and a reflux diet. 3)  We sent a perscription for Protonix and Bentyl  and Flagyl the antibiotic to your pharmacy. 4)  Please go to the lab  basement level. 5)  Stop the Cipro medication. 6)  Please go to [place] [facility], [address] today at 3:15PM.  You can eat until 11:30AM.  You are to drink 8 oz of water at 1:30 and 8 oz  at 2:30PM.  7)  We made you a follow up appointment with Dr. Arlyce Dice on 05-05-09.  8)  Please call us on Mon with a progress report. 9)    Prescriptions: NEXIUM 40 MG CPDR (ESOMEPRAZOLE MAGNESIUM) Take 1 cap 30 min before breakfast  #30 x 3   Entered by:   Lowry Ram  NCMA   Authorized by:   Willette Cluster NP   Signed by:   Lowry Ram NCMA on 04/23/2009   Method used:   Electronically to        [facility] (retail)       [address]       [place], [place]  [id number]       Ph: [phone] or [phone]       Fax: [phone]   RxID:   [id number] PROTONIX 40 MG TBEC (PANTOPRAZOLE SODIUM) Take 1 tab 30 min prior to breakfast  #30 x 1   Entered by:   Lowry Ram NCMA   Authorized by:   Willette Cluster NP   Signed by:   Lowry Ram NCMA on 04/23/2009   Method used:   Electronically to        [facility] (retail)       [address]       [place], [place]  [address]       Ph: [phone] or [phone]       Fax: [phone]   RxID:   [id number] FLAGYL 250 MG TABS (METRONIDAZOLE) Take 1 tab 3 times daily x 10 days  #30 x 0   Entered by:   Lowry Ram NCMA   Authorized by:   Willette Cluster NP   Signed by:   Lowry Ram NCMA on 04/23/2009   Method used:   Electronically to        [facility] (retail)       [address]       [place], [place]  [id number]       Ph: [phone] or [phone]       Fax: [phone]   RxID:   [id number] BENTYL 10 MG CAPS (DICYCLOMINE HCL) Take 2 times daily  #60 x 1   Entered by:   Lowry Ram NCMA   Authorized by:   Willette Cluster NP   Signed by:   Lowry Ram NCMA on 04/23/2009   Method used:   Electronically to        [facility] (retail)       [address]       [place], [place]  [id number]       Ph: [phone] or [phone]       Fax: [phone]   RxID:   [id number]

## 2010-03-24 NOTE — Letter (Signed)
Summary: [facility]   Imported By: Sherian Rein 01/03/2010 09:36:26  _____________________________________________________________________  External Attachment:    Type:   Image     Comment:   External Document

## 2010-03-24 NOTE — Progress Notes (Signed)
Summary: TRIAGE  Phone Note Outgoing Call Call back at [facility] Phone [phone]   Caller: Patient Call placed by: Merri Ray CMA Duncan Dull),  April 22, 2009 2:08 PM Summary of Call: Called pt to inform it was time to have her 10 month follow up for her abdominal CT. To follow up from a Pancreatic cyst adenoma from her previous CT. Pt stated she just had colon surgery and had a colostomy but its since been reversed. She is having alot of diarrhea and abdominal pain. Pt states she thinks it is a diverticular flare. She said she is not feeling up to having a CT done yet. She wanted to be seen ASAP with Dr Arlyce Dice. Offered her an appointment for first of next week. she stated she could not wait. Put her on with P.Gunther for 9:30 in the morning. She would discuss having CT with Gunnar Fusi tomorrow Initial call taken by: Merri Ray CMA Duncan Dull),  April 22, 2009 2:13 PM

## 2010-03-24 NOTE — Letter (Signed)
Summary: [facility]   Imported By: Sherian Rein 09/16/2009 10:20:25  _____________________________________________________________________  External Attachment:    Type:   Image     Comment:   External Document

## 2010-03-24 NOTE — Letter (Signed)
Summary: [facility]   Imported By: Lester Triplett 05/20/2009 09:32:38  _____________________________________________________________________  External Attachment:    Type:   Image     Comment:   External Document

## 2010-05-07 LAB — URINALYSIS, ROUTINE W REFLEX MICROSCOPIC
Bilirubin Urine: NEGATIVE
Glucose, UA: NEGATIVE mg/dL
Hgb urine dipstick: NEGATIVE
Ketones, ur: NEGATIVE mg/dL
Protein, ur: NEGATIVE mg/dL

## 2010-05-07 LAB — BASIC METABOLIC PANEL
BUN: 12 mg/dL (ref 6–23)
Calcium: 8.8 mg/dL (ref 8.4–10.5)
Chloride: 101 mEq/L (ref 96–112)
GFR calc non Af Amer: >60 mL/min (ref >60)
Potassium: 3.3 mEq/L — ABNORMAL LOW (ref 3.5–5.1)
Potassium: 4.1 mEq/L (ref 3.5–5.1)
Sodium: 135 mEq/L (ref 135–145)
Sodium: 138 mEq/L (ref 135–145)

## 2010-05-07 LAB — CBC
HCT: 39.5 % (ref 36.0–46.0)
Hemoglobin: 13.8 g/dL (ref 12.0–15.0)
MCHC: 34.3 g/dL (ref 30.0–36.0)
MCV: 92 fL (ref 78.0–100.0)
Platelets: 229 10*3/uL (ref 150–400)
Platelets: 287 10*3/uL (ref 150–400)
RBC: 3.99 MIL/uL (ref 3.87–5.11)
WBC: 13.1 10*3/uL — ABNORMAL HIGH (ref 4.0–10.5)
WBC: 7.3 10*3/uL (ref 4.0–10.5)

## 2010-05-07 LAB — COMPREHENSIVE METABOLIC PANEL
ALT: 15 U/L (ref 0–35)
AST: 17 U/L (ref 0–37)
Albumin: 3 g/dL — ABNORMAL LOW (ref 3.5–5.2)
CO2: 24 mEq/L (ref 19–32)
Calcium: 8.8 mg/dL (ref 8.4–10.5)
Chloride: 104 mEq/L (ref 96–112)
Creatinine, Ser: 0.63 mg/dL (ref 0.4–1.2)
GFR calc Af Amer: >60 mL/min (ref >60)
GFR calc non Af Amer: >60 mL/min (ref >60)
Sodium: 134 mEq/L — ABNORMAL LOW (ref 135–145)

## 2010-05-07 LAB — URINE CULTURE: Colony Count: 4000

## 2010-05-07 LAB — DIFFERENTIAL
Eosinophils Absolute: 0.3 10*3/uL (ref 0.0–0.7)
Eosinophils Relative: 4 % (ref 0–5)
Lymphs Abs: 1.8 10*3/uL (ref 0.7–4.0)
Monocytes Absolute: 0.8 10*3/uL (ref 0.1–1.0)
Monocytes Relative: 11 % (ref 3–12)

## 2010-05-07 LAB — MAGNESIUM: Magnesium: 2 mg/dL (ref 1.5–2.5)

## 2010-05-08 LAB — CBC
HCT: 34.7 % — ABNORMAL LOW (ref 36.0–46.0)
Hemoglobin: 12.1 g/dL (ref 12.0–15.0)
Hemoglobin: 12.1 g/dL (ref 12.0–15.0)
MCHC: 34.1 g/dL (ref 30.0–36.0)
MCHC: 34.9 g/dL (ref 30.0–36.0)
MCV: 91.7 fL (ref 78.0–100.0)
Platelets: 220 10*3/uL (ref 150–400)
Platelets: 251 10*3/uL (ref 150–400)
RDW: 14.2 % (ref 11.5–15.5)
RDW: 14.4 % (ref 11.5–15.5)

## 2010-05-08 LAB — BASIC METABOLIC PANEL
BUN: 2 mg/dL — ABNORMAL LOW (ref 6–23)
Chloride: 104 mEq/L (ref 96–112)
Creatinine, Ser: 0.6 mg/dL (ref 0.4–1.2)
Glucose, Bld: 125 mg/dL — ABNORMAL HIGH (ref 70–99)
Potassium: 4 mEq/L (ref 3.5–5.1)

## 2010-05-09 ENCOUNTER — Other Ambulatory Visit: Payer: Self-pay | Admitting: Gastroenterology

## 2010-05-09 ENCOUNTER — Telehealth: Payer: Self-pay | Admitting: Gastroenterology

## 2010-05-09 DIAGNOSIS — D136 Benign neoplasm of pancreas: Secondary | ICD-10-CM

## 2010-05-12 ENCOUNTER — Other Ambulatory Visit: Payer: Self-pay | Admitting: Gastroenterology

## 2010-05-12 ENCOUNTER — Ambulatory Visit (INDEPENDENT_AMBULATORY_CARE_PROVIDER_SITE_OTHER)
Admission: RE | Admit: 2010-05-12 | Discharge: 2010-05-12 | Disposition: A | Discharge status: Home / Self Care | Payer: Medicare Other | Source: Ambulatory Visit | Attending: Gastroenterology | Admitting: Gastroenterology

## 2010-05-12 DIAGNOSIS — D136 Benign neoplasm of pancreas: Secondary | ICD-10-CM

## 2010-05-12 HISTORY — DX: Diverticulitis of large intestine with perforation and abscess without bleeding: K57.20

## 2010-05-12 HISTORY — DX: Essential (primary) hypertension: I10

## 2010-05-12 MED ORDER — IOHEXOL 300 MG/ML  SOLN
100.0000 mL | Freq: Once | INTRAMUSCULAR | Status: AC | PRN
  Administered 2010-05-12: 100 mL via INTRAVENOUS

## 2010-05-17 ENCOUNTER — Telehealth: Payer: Self-pay | Admitting: Gastroenterology

## 2010-05-17 NOTE — Telephone Encounter (Signed)
Left message to call back  

## 2010-05-17 NOTE — Telephone Encounter (Signed)
Patient wants to be seen sooner and worked in with [employer]. Will call pt when schedule open.

## 2010-05-19 NOTE — Progress Notes (Signed)
Summary: Questions  Phone Note Call from Patient Call back at Home Phone [phone]   Caller: Patient Call For: Dr. Arlyce Dice Reason for Call: Talk to Nurse Summary of Call: patient wants to speak with nurse to see if it is time agian for her to have another CT Initial call taken by: [place] Johnson,  May 09, 2010 10:23 AM  Follow-up for Phone Call        Patient states it is time for her CT scan, per records she was supposed to have a followup in 1 year. Scheduled CT of abd and pelvis for 05/12/10 patient to arrive at 8:30am to start drinking water, scan at 9am. Patient aware of appointment date and time. Patient wants to know if she needs a followup with Arlyce Dice after CT scan. Dr. Arlyce Dice please advise. Follow-up by: Selinda Michaels RN,  May 09, 2010 12:00 PM  Additional Follow-up for Phone Call Additional follow up Details #1::        yes, f/u app't Additional Follow-up by: Louis Meckel MD,  May 09, 2010 3:07 PM    Additional Follow-up for Phone Call Additional follow up Details #2::    F/U appointment scheduled with Dr. Arlyce Dice for 06/22/10@1 :45pm. Patient aware of appointment date and time. Follow-up by: Selinda Michaels RN,  May 09, 2010 3:19 PM

## 2010-05-20 ENCOUNTER — Telehealth: Payer: Self-pay

## 2010-05-20 NOTE — Telephone Encounter (Signed)
Pt has appt scheduled with Willette Cluster NP for 05/24/10@3 :30pm. Left message for pt to call back for appt information.

## 2010-05-20 NOTE — Telephone Encounter (Signed)
Pt given appt to see Willette Cluster NP 05/24/10@3 :30pm. Pt aware of appt date and time.

## 2010-05-24 ENCOUNTER — Ambulatory Visit (INDEPENDENT_AMBULATORY_CARE_PROVIDER_SITE_OTHER): Payer: Medicare Other | Admitting: Nurse Practitioner

## 2010-05-24 ENCOUNTER — Encounter: Payer: Self-pay | Admitting: Nurse Practitioner

## 2010-05-24 VITALS — BP 132/74 | HR 64 | Ht 64.0 in | Wt 137.0 lb

## 2010-05-24 DIAGNOSIS — R1032 Left lower quadrant pain: Secondary | ICD-10-CM

## 2010-05-24 DIAGNOSIS — D490 Neoplasm of unspecified behavior of digestive system: Secondary | ICD-10-CM

## 2010-05-24 DIAGNOSIS — R112 Nausea with vomiting, unspecified: Secondary | ICD-10-CM

## 2010-05-24 DIAGNOSIS — R1012 Left upper quadrant pain: Secondary | ICD-10-CM

## 2010-05-24 DIAGNOSIS — K2270 Barrett's esophagus without dysplasia: Secondary | ICD-10-CM

## 2010-05-24 DIAGNOSIS — R109 Unspecified abdominal pain: Secondary | ICD-10-CM

## 2010-05-24 NOTE — Patient Instructions (Signed)
We have scheduled the Endoscopy with Dr. Arlyce Dice on 05-31-2010. Directions and brochure provided.

## 2010-05-26 ENCOUNTER — Encounter: Payer: Self-pay | Admitting: Nurse Practitioner

## 2010-05-26 DIAGNOSIS — R1032 Left lower quadrant pain: Secondary | ICD-10-CM | POA: Insufficient documentation

## 2010-05-26 NOTE — Assessment & Plan Note (Signed)
Overall stable by recent CTscan. She gets annual scans per Dr. Arlyce Dice.

## 2010-05-26 NOTE — Assessment & Plan Note (Signed)
Continue PPI. Last EGD two years ago.

## 2010-05-26 NOTE — Progress Notes (Signed)
M[facility] [id number] 06/30/1945   History of Present Illness: Patient is a 65 year old female with multiple medical problems including renal transplantation, heart failure, hypertension and bowel perforation from diverticulitis which required colostomy July 2010. She is s/p takedown Jan. 2011. Patient followed by Dr. Arlyce Dice for Barrett's esophagus and pancreatic cystic neoplasm for which she has been getting surveillance. Patient was last seen one year ago for lower abdominal pain, diarrhea. She was treated empirically for infectious colitis. Shortly after that visit her surveillance CTscan showed sigmoid wall thickening and inflammation. Patient called in a few days ago with LUQ pain and nausea with intermittent vomiting. Pain present for several months but decided to call in as she is also having burning in left groin. No fevers. No diarrhea. No weight loss by our records. Patient had her surveillance CTscan (pancreatic cystic neoplasm) on 05/12/10. The low attenuation lobular lesion associated with the body of the pancreas has increased slightly in size now measuring 69 x 43 mm compared to  59 x 43 mm previously.    Current Medications, Allergies, Past Medical History, Past Surgical History, Family History and Social History were reviewed in [employer] record.   Physical Exam: General: Well developed , well nourished white female in no acute distress Head: Normocephalic and atraumatic Eyes:  sclerae anicteric,conjunctive pink. Ears: Normal auditory acuity Mouth: No deformity or lesions Neck: Supple, no masses.  Lungs: Clear throughout to auscultation Heart: Regular rate and rhythm; no murmurs heard Abdomen: Soft, nondistended, mild LUQ pain, mild LLQ pain. No masses or hepatomegaly noted. Normal bowel sounds Rectal: Not done Musculoskeletal: Symmetrical with no gross deformities  Skin: No lesions on visible extremities Extremities: No edema or deformities  noted Neurological: Alert oriented x 4, grossly nonfocal Cervical Nodes:  No significant cervical adenopathy Psychological:  Alert and cooperative. Normal mood and affect  Assessment and Plan:  BARRETTS ESOPHAGUS Continue PPI. Last EGD two years ago.  ABDOMINAL PAIN-MULTIPLE SITES Six month history of LUQ pain with nausea of unclear etiology. Recent CTscan showed pancreatic cystic neoplasm to be stable in size overall. For further evaluation patient will be scheduled for EGD. The benefits,risks, and alternatives to the procedure discussed and she agrees to proceed.   NEOPLASM UNSPECIFIED NATURE DIGESTIVE SYSTEM Overall stable by recent CTscan. She gets annual scans per Dr. Arlyce Dice.

## 2010-05-26 NOTE — Assessment & Plan Note (Signed)
Six month history of LUQ pain with nausea of unclear etiology. Recent CTscan showed pancreatic cystic neoplasm to be stable in size overall. For further evaluation patient will be scheduled for EGD. The benefits,risks, and alternatives to the procedure discussed and she agrees to proceed.

## 2010-05-29 LAB — DIFFERENTIAL
Basophils Absolute: 0.1 10*3/uL (ref 0.0–0.1)
Eosinophils Relative: 1 % (ref 0–5)
Lymphocytes Relative: 4 % — ABNORMAL LOW (ref 12–46)
Lymphs Abs: 0.5 10*3/uL — ABNORMAL LOW (ref 0.7–4.0)
Monocytes Relative: 4 % (ref 3–12)
Neutro Abs: 12.5 10*3/uL — ABNORMAL HIGH (ref 1.7–7.7)

## 2010-05-29 LAB — BASIC METABOLIC PANEL
BUN: 10 mg/dL (ref 6–23)
BUN: 5 mg/dL — ABNORMAL LOW (ref 6–23)
BUN: 6 mg/dL (ref 6–23)
BUN: 6 mg/dL (ref 6–23)
CO2: 26 mEq/L (ref 19–32)
CO2: 27 mEq/L (ref 19–32)
Calcium: 9 mg/dL (ref 8.4–10.5)
Calcium: 9.6 mg/dL (ref 8.4–10.5)
Chloride: 102 mEq/L (ref 96–112)
Chloride: 102 mEq/L (ref 96–112)
Chloride: 103 mEq/L (ref 96–112)
Chloride: 103 mEq/L (ref 96–112)
Chloride: 106 mEq/L (ref 96–112)
Creatinine, Ser: 0.61 mg/dL (ref 0.4–1.2)
GFR calc Af Amer: >60 mL/min (ref >60)
GFR calc Af Amer: >60 mL/min (ref >60)
GFR calc Af Amer: >60 mL/min (ref >60)
GFR calc Af Amer: >60 mL/min (ref >60)
GFR calc non Af Amer: 49 mL/min — ABNORMAL LOW (ref >60)
GFR calc non Af Amer: 56 mL/min — ABNORMAL LOW (ref >60)
GFR calc non Af Amer: >60 mL/min (ref >60)
GFR calc non Af Amer: >60 mL/min (ref >60)
GFR calc non Af Amer: >60 mL/min (ref >60)
Glucose, Bld: 117 mg/dL — ABNORMAL HIGH (ref 70–99)
Glucose, Bld: 118 mg/dL — ABNORMAL HIGH (ref 70–99)
Glucose, Bld: 138 mg/dL — ABNORMAL HIGH (ref 70–99)
Potassium: 2.5 mEq/L — CL (ref 3.5–5.1)
Potassium: 3.4 mEq/L — ABNORMAL LOW (ref 3.5–5.1)
Potassium: 3.5 mEq/L (ref 3.5–5.1)
Potassium: 4.2 mEq/L (ref 3.5–5.1)
Potassium: 4.2 mEq/L (ref 3.5–5.1)
Potassium: 4.2 mEq/L (ref 3.5–5.1)
Sodium: 135 mEq/L (ref 135–145)
Sodium: 136 mEq/L (ref 135–145)
Sodium: 138 mEq/L (ref 135–145)
Sodium: 140 mEq/L (ref 135–145)

## 2010-05-29 LAB — CBC
HCT: 30.1 % — ABNORMAL LOW (ref 36.0–46.0)
HCT: 30.9 % — ABNORMAL LOW (ref 36.0–46.0)
HCT: 31.5 % — ABNORMAL LOW (ref 36.0–46.0)
HCT: 33.8 % — ABNORMAL LOW (ref 36.0–46.0)
HCT: 34.4 % — ABNORMAL LOW (ref 36.0–46.0)
HCT: 38.1 % (ref 36.0–46.0)
Hemoglobin: 10.1 g/dL — ABNORMAL LOW (ref 12.0–15.0)
Hemoglobin: 10.7 g/dL — ABNORMAL LOW (ref 12.0–15.0)
Hemoglobin: 11.6 g/dL — ABNORMAL LOW (ref 12.0–15.0)
Hemoglobin: 13.1 g/dL (ref 12.0–15.0)
MCHC: 33.7 g/dL (ref 30.0–36.0)
MCHC: 34.5 g/dL (ref 30.0–36.0)
MCV: 88.3 fL (ref 78.0–100.0)
MCV: 88.5 fL (ref 78.0–100.0)
Platelets: 252 10*3/uL (ref 150–400)
Platelets: 292 10*3/uL (ref 150–400)
Platelets: 300 10*3/uL (ref 150–400)
Platelets: 328 10*3/uL (ref 150–400)
Platelets: 358 10*3/uL (ref 150–400)
RBC: 3.85 MIL/uL — ABNORMAL LOW (ref 3.87–5.11)
RBC: 3.9 MIL/uL (ref 3.87–5.11)
RBC: 4.41 MIL/uL (ref 3.87–5.11)
RDW: 15.9 % — ABNORMAL HIGH (ref 11.5–15.5)
RDW: 16 % — ABNORMAL HIGH (ref 11.5–15.5)
RDW: 16.1 % — ABNORMAL HIGH (ref 11.5–15.5)
RDW: 16.4 % — ABNORMAL HIGH (ref 11.5–15.5)
WBC: 13.7 10*3/uL — ABNORMAL HIGH (ref 4.0–10.5)
WBC: 5.8 10*3/uL (ref 4.0–10.5)
WBC: 6.3 10*3/uL (ref 4.0–10.5)
WBC: 7.5 10*3/uL (ref 4.0–10.5)

## 2010-05-29 LAB — URINALYSIS, ROUTINE W REFLEX MICROSCOPIC
Bilirubin Urine: NEGATIVE
Glucose, UA: NEGATIVE mg/dL
Ketones, ur: NEGATIVE mg/dL
Nitrite: NEGATIVE
Specific Gravity, Urine: 1.016 (ref 1.005–1.030)
pH: 5.5 (ref 5.0–8.0)

## 2010-05-29 LAB — URINE CULTURE: Colony Count: NO GROWTH

## 2010-05-29 LAB — RENAL FUNCTION PANEL
Albumin: 2.8 g/dL — ABNORMAL LOW (ref 3.5–5.2)
BUN: 9 mg/dL (ref 6–23)
CO2: 26 mEq/L (ref 19–32)
Chloride: 108 mEq/L (ref 96–112)
GFR calc Af Amer: >60 mL/min (ref >60)
GFR calc non Af Amer: >60 mL/min (ref >60)
Potassium: 4.9 mEq/L (ref 3.5–5.1)

## 2010-05-29 LAB — IRON AND TIBC
Saturation Ratios: 15 % — ABNORMAL LOW (ref 20–55)
UIBC: 191 ug/dL

## 2010-05-30 NOTE — Progress Notes (Signed)
I performed EUS for her 3 years ago (on review of [facility] records), the cyst had grown in 6 year interval at that point and has grown again.  Looked like a serous cyst but I wrote on EUS report that she should consider surgery given it's interval growth.  Has grown further now, would again consider resection.

## 2010-05-30 NOTE — Progress Notes (Signed)
I will talk with the pt. She needs an OV following her EGD

## 2010-05-31 ENCOUNTER — Encounter: Payer: Self-pay | Admitting: Gastroenterology

## 2010-05-31 ENCOUNTER — Ambulatory Visit (AMBULATORY_SURGERY_CENTER): Payer: Medicare Other | Admitting: Gastroenterology

## 2010-05-31 VITALS — BP 141/83 | HR 66 | Temp 98.3°F | Resp 18 | Ht 65.0 in | Wt 137.0 lb

## 2010-05-31 DIAGNOSIS — K2270 Barrett's esophagus without dysplasia: Secondary | ICD-10-CM

## 2010-05-31 DIAGNOSIS — R1012 Left upper quadrant pain: Secondary | ICD-10-CM

## 2010-05-31 MED ORDER — SODIUM CHLORIDE 0.9 % IV SOLN: 500.0000 mL | INTRAVENOUS | Status: DC

## 2010-05-31 MED ORDER — HYOSCYAMINE SULFATE ER 0.375 MG PO TBCR: EXTENDED_RELEASE_TABLET | ORAL | Status: DC

## 2010-05-31 NOTE — Patient Instructions (Addendum)
NORMAL ENDOSCOPY CALL DR Nita Sells OFFICE IN THE NEXT 2-3 DAYS AT [phone] TO SCHEDULE AN OFFICE APPOINTMENT FOR THE NEXT 2-3 WEEKS TRIAL OF HYOMAX 0.375 MG TWICE A DAY-PRESCRIPTION WAS FAXED TO YOUR PHARMACY TODAY

## 2010-05-31 NOTE — Progress Notes (Signed)
THROUGHOUT RECOVERY PATIENT CONSTANTLY SHAKING, PATTING FINGER WITH PULSE OX PROBE. MONITOR REGISTERING LOW SAT WHICH WAS INCORRECT DUE TO PT DOING ABOVE. PATIENT ASKED SEVERAL TIMES TO KEEP FINGER STILL AND TRY TO NOT PAT/ SHAKE FINGER TO KEEP SAT REGISTERING A CORRECT READING. O2 LEVEL TRULY IN THE 90S AND PT PINK IN COLOR, ALERT, RESP NORMAL, NO DISTRESS NOTED. E Natina Wiginton RN

## 2010-06-01 ENCOUNTER — Telehealth: Payer: Self-pay | Admitting: *Deleted

## 2010-06-01 NOTE — Telephone Encounter (Signed)
Writer clarified with pt re: which pharmacy she used.  [facility].  Spoke with pharmacy tech at pt's pharmacy who states that rx for Hyoscyamine wasn't received via fax.  Fax number received and rx faxed to [phone]

## 2010-06-01 NOTE — Telephone Encounter (Signed)
No identifier on home phone and no voicemail setup on cell phone. No messages left.

## 2010-06-22 ENCOUNTER — Encounter: Payer: Self-pay | Admitting: Gastroenterology

## 2010-06-22 ENCOUNTER — Ambulatory Visit (INDEPENDENT_AMBULATORY_CARE_PROVIDER_SITE_OTHER): Payer: Medicare Other | Admitting: Gastroenterology

## 2010-06-22 DIAGNOSIS — D490 Neoplasm of unspecified behavior of digestive system: Secondary | ICD-10-CM

## 2010-06-22 DIAGNOSIS — R1310 Dysphagia, unspecified: Secondary | ICD-10-CM | POA: Insufficient documentation

## 2010-06-22 DIAGNOSIS — K2270 Barrett's esophagus without dysplasia: Secondary | ICD-10-CM

## 2010-06-22 DIAGNOSIS — R109 Unspecified abdominal pain: Secondary | ICD-10-CM

## 2010-06-22 MED ORDER — GLYCOPYRROLATE 2 MG PO TABS: 2.0000 mg | ORAL_TABLET | Freq: Three times a day (TID) | ORAL | Status: DC | PRN

## 2010-06-22 NOTE — Progress Notes (Signed)
History of Present Illness:  [facility] has returned for followup of her abdominal pain and pancreatic mass. Upper endoscopy was unrevealing. She did not take hyomax because she could not afford the medication. Followup CT scan showed very slight enlargement of her pancreatic cystic neoplasm.  She is complaining of dysphagia to solids.  She has a history of an esophageal stricture which has been dilated in the past.    Review of Systems: Pertinent positive and negative review of systems were noted in the above HPI section. All other review of systems were otherwise negative.    Current Medications, Allergies, Past Medical History, Past Surgical History, Family History and Social History were reviewed in [employer] electronic medical record  Vital signs were reviewed in today's medical record. Physical Exam:   Abdominal exam is without masses, tenderness, organomegaly

## 2010-06-22 NOTE — Assessment & Plan Note (Signed)
She likely has a recurrent stricture.  Recommendations #1 repeat endoscopy with dilatation of the stricture

## 2010-06-22 NOTE — Patient Instructions (Addendum)
Cc. Bethena Midget Your EGD is scheduled at [facility] on 06/28/2010 at 10:45am

## 2010-06-22 NOTE — Assessment & Plan Note (Signed)
Plan biopsies when she undergoes repeat endoscopy

## 2010-06-22 NOTE — Assessment & Plan Note (Addendum)
There is a very slight growth of the neoplasm over the years. The patient does not wish to have surgery at this time. Plan f/u CT in 1 year

## 2010-06-22 NOTE — Assessment & Plan Note (Signed)
Pain is not specific. I don't think that she has symptoms referable to pancreatic neoplasm.  Medications #1 trial of Robinul forte

## 2010-06-24 ENCOUNTER — Telehealth: Payer: Self-pay | Admitting: Gastroenterology

## 2010-06-24 NOTE — Telephone Encounter (Signed)
ok 

## 2010-06-28 ENCOUNTER — Encounter: Payer: Medicare Other | Admitting: Gastroenterology

## 2010-06-28 ENCOUNTER — Telehealth: Payer: Self-pay | Admitting: Gastroenterology

## 2010-06-30 NOTE — Telephone Encounter (Signed)
We need the information on where to send the report   No answer

## 2010-07-05 NOTE — Assessment & Plan Note (Signed)
[facility]                         GASTROENTEROLOGY OFFICE NOTE   NAME:[facility], Morgan [facility]                        MRN:          [id number]  DATE:03/04/2007                            DOB:          [birth year]    PROBLEMS:  1. Cystic neoplasm of the pancreas.  2. Barrett's esophagus.  3. Status post kidney transplant.   HISTORY OF PRESENT ILLNESS:  [facility] has returned for re-evaluation.  She has a history of Barrett's esophagus and was last examined over two  years ago.  She is currently complaining of moderately severe pyrosis  despite taking omeprazole once a day.  She is also having intermittent  dysphagia to solids. She has a well documented cystic neoplasm of the  pancreas that appears most like a serous cystadenoma.  Recent CT scan  demonstrated a slight increase in the size, now measuring 39 x 58 mm  compared to 51 x 25 mm two years ago.  She is without abdominal pain.  She does complain of some abdominal bloating.   PAST MEDICAL HISTORY:  Pertinent for renal transplant.  She has  hypertension and arthritis.  She is status post appendectomy.   FAMILY HISTORY:  Noncontributory.   MEDICATIONS:  Her medications include Prozac, metoprolol, prednisone 7.5  mg a day, Doxepin, Imuran, Lipitor, Lasix, Ocuvite, Fosamax, Verapamil  and omeprazole 20 mg a day.   ALLERGIES:  She has no allergies.   HABITS:  She neither smokes nor drinks.   SOCIAL HISTORY:  She is widowed and unemployed.   REVIEW OF SYSTEMS:  Positive for joint pains, night sweats, back pain  and some shortness of breath.   PHYSICAL EXAMINATION:  VITAL SIGNS:  Pulse 72.  Blood pressure 138/80.  Weight 140.  HEENT:  EOMI.  PERRLA.  Sclerae are anicteric.  Conjunctivae are pink.  NECK:  Supple without thyromegaly, adenopathy or carotid bruits.  CHEST:  Clear to auscultation and percussion without adventitious  sounds.  CARDIAC:  Regular rhythm; normal S1 S2.  There are no murmurs,  gallops  or rubs.  ABDOMEN:  Bowel sounds are normoactive.  Abdomen is soft, nontender and  nondistended.  There are no abdominal masses, tenderness, splenic  enlargement or hepatomegaly.  EXTREMITIES:  Full range of motion.  No cyanosis, clubbing or edema.  RECTAL:  Rectal exam is deferred.   IMPRESSION:  1. Slightly enlarging cystic neoplasm of the pancreas.  This is most      likely a serous cystadenoma.  2. Barrett's esophagus.  3. Dysphagia, rule out early esophageal stricture.  4. Status post renal transplant.   RECOMMENDATION:  1. Increase omeprazole to 40 mg a day.  2. Upper endoscopy for possible dilatation and for biopsies of the      Barrett's.  3. Check CEA and CA19-9 levels.  4. Endoscopic ultrasound of the pancreas.     Barbette Hair. Arlyce Dice, MD,FACG  Electronically Signed    RDK/MedQ  DD: 03/04/2007  DT: 03/04/2007  Job #: [id number]   cc:   Garnetta Buddy, M.D.  Rachael Fee, MD

## 2010-07-05 NOTE — Assessment & Plan Note (Signed)
[facility]                         GASTROENTEROLOGY OFFICE NOTE   NAME:[facility], [facility]                        MRN:          [id number]  DATE:04/24/2007                            DOB:          22-Dec-1945    PROBLEM:  1. Serous cystadenoma of the pancreas.  2. Barrett's esophagus.  3. Status post kidney transplant.   [name] has returned for scheduled followup.  US findings with  aspiration were consistent with a serous cystadenoma.  [name] has  low-grade discomfort in her upper abdomen.  This has been stable.  Her  neoplasm has been slowly growing.  In 2003, it measured approximately  2.2 by 3.8 cm.  Most recently, it measured 5.1 by 2.5 cm.  She also had  an esophageal stricture, which was successfully dilated.   EXAM:  Pulse 78, blood pressure 144/86, weight 137.  ABDOMINAL EXAM:  There is mild fullness in the epigastric area without  frank masses.  There is no guarding and no rebound.   IMPRESSION:  1. Slowly-growing serous cystadenoma of the pancreas.  I believe her      symptoms are minimal from this, though the cyst is inexorably      enlarging.  2. Barrett's esophagus.  3. Status post renal transplant.   RECOMMENDATIONS:  1. I offered to have [name] obtain a surgical opinion regarding      resection.  She wishes to speak with her daughter first about this.  2. Followup endoscopy in approximately two years for Barrett's      esophagus.  3. Repeat dilatation p.r.n.     Barbette Hair. Arlyce Dice, MD,FACG  Electronically Signed    RDK/MedQ  DD: 04/24/2007  DT: 04/24/2007  Job #: [id number]   cc:   Garnetta Buddy, M.D.

## 2010-07-05 NOTE — Op Note (Signed)
[facility], [facility]                 ACCOUNT NO.:  [id number]   MEDICAL RECORD NO.:  [id number]          PATIENT TYPE:  INP   LOCATION:  3307                         FACILITY:  [facility]   PHYSICIAN:  Lennie Muckle, MD      DATE OF BIRTH:  01-08-1946   DATE OF PROCEDURE:  09/05/2008  DATE OF DISCHARGE:                               OPERATIVE REPORT   PREOPERATIVE DIAGNOSIS:  Perforated viscus.   POSTOPERATIVE DIAGNOSIS:  Perforated viscus.   PROCEDURE:  Sigmoid resection and mobilization of splenic flexure and  colostomy.   SURGEON:  1. Lennie Muckle, MD  2. Almond Lint, MD   ANESTHESIA:  General endotracheal anesthesia.   FINDINGS:  Small perforation in distal colon.   SPECIMEN:  Left colon to Pathology.   ESTIMATED BLOOD LOSS:  200 mL.   COMPLICATIONS:  No immediate complications.   INDICATION FOR PROCEDURE:  Morgan Cooper is a 65 year old female who came in  with abdominal pain, nausea, and vomiting.  She had a previous history  of renal transplant.  CT scan revealed free air and she has had  peritoneal sign.  Due to her immunosuppression and free air and findings  likely related to perforated diverticulitis, it was discussed that the  patient go to the operating room for resection and ostomy.  She was  somewhat concerned due to her renal transplant, however, due to her  clinical condition with perforation and hypotension, it was thought she  would do better going to the operating room.  She had received Cipro and  Flagyl in the emergency department.   DETAILS OF PROCEDURE:  The patient was identified in the preoperative  holding area, taken to the operating room, placed in supine position.  She was placed in general endotracheal anesthesia.  A Foley catheter was  placed.  Her abdomen was then prepped and draped in the usual sterile  fashion.  A time-out procedure indicating the patient and procedure were  performed.  I placed an incision in the midline.  I divided  subcutaneous  tissues with electrocautery, found entry into the abdominal cavity  without difficulty.  The length of the incision went down inferiorly to  the area of the renal transplant, which was in the preperitoneal space.  I avoided all contact with this, peritoneum was in place.  I did not  find any fluid within the abdomen.  I was able to begin mobilizing the  colon on the left.  This began from the line of Toldt near the renal  transplant.  The tissues were friable and this made mobilization  somewhat easy.  Most of the dissection was done with finger dissection  and electrocautery, continued to do distally down into the pelvis.  I  found a small area in the sigmoid distally, which appeared to have the  area of perforation.  There was a small hole in the lateral aspect near  the colon.  No fluid was noted.  There was air coming from the vicinity.  We continued superiorly along the line of Toldt, and freeing the colon  using  electrocautery.  I freed up the splenic flexure to gain enough  length on the colon and to perform ostomy.  There was a small tear in  the serosa of the descending colon.  We elected to divide this with a 75  GIA stapler and included this in the specimen.  Mesentery was divided  with LigaSure device.  The specimen was then handed off the operative  field with the stitch marked in the distal sigmoid, the area of the  perforation.   We irrigated the abdomen with saline, found no purulent fluid.  We then  picked the area in the left upper quadrant to bring the ostomy through.  Incision was placed at the skin, divided subcutaneous tissues with  electrocautery, made a cruciate incision in the anterior rectus fascia,  divided the rectus muscle and made a cruciate incision in the posterior  fascia.  The descending colon was brought through the hole without any  difficulty.  A Tanja Port was placed on this to hold this in place.  We  then inspected the abdomen and found  no other evidence of injury, closed  the fascial defect with the running PDS suture.  Skin was irrigated,  this was stapled and closed.  I then turned my attention to the ostomy,  this was assured 4-0 Vicryl securing the serosal mucosa to the dermis.  Ostomy appliance was applied.  The patient was then extubated, and  transported to the postanesthesia care unit.  She was in stable  condition.  She received preoperative stress-dose steroids and SCDs  postoperatively, will be kept on her Imuran, and I will inform Dr. Hyman Hopes  of her admission.      Lennie Muckle, MD  Electronically Signed     ALA/MEDQ  D:  09/05/2008  T:  09/06/2008  Job:  [id number]

## 2010-07-05 NOTE — Assessment & Plan Note (Signed)
[facility]                         GASTROENTEROLOGY OFFICE NOTE   NAME:Cooper, Morgan ROSIER                        MRN:          [id number]  DATE:04/24/2007                            DOB:          03/04/1945    I spoke with [name]'s daughter today, Fulton Mole, regarding her mother's  clinical situation.  I explained that her pancreatic cyst has grown  slightly over the last several years.  Options including careful  observation versus consideration of surgical removal were discussed.   At this point, she wishes to continue observation.  I expressed concerns  that, over the long term, the cyst could grow and become symptomatic,  though I am not inclined to be aggressive at this point.  Accordingly,  we have agreed to repeat her scan in approximately 1 year.     Barbette Hair. Arlyce Dice, MD,FACG  Electronically Signed    RDK/MedQ  DD: 04/24/2007  DT: 04/24/2007  Job #: [id number]   cc:   Garnetta Buddy, M.D.

## 2010-07-05 NOTE — Consult Note (Signed)
NAMECHELSIA, SERRES                 ACCOUNT NO.:  [id number]   MEDICAL RECORD NO.:  [id number]          PATIENT TYPE:  INP   LOCATION:  3307                         FACILITY:  [facility]   PHYSICIAN:  Maree Krabbe, M.D.DATE OF BIRTH:  04/02/1945   DATE OF CONSULTATION:  09/06/2008  DATE OF DISCHARGE:                                 CONSULTATION   HISTORY:  This is a 65 year old white female with a history of  hypertension and renal failure with renal transplant back in 1984.  She  got her kidney from a sister and it was a good match and she has only  been taking Imuran and prednisone since.  Her baseline creatinine is  0.9.  She follows with Dr. Hyman Hopes at [facility].  The  patient presented with abdominal pain, nausea, and vomiting, and was  found to have a perforated viscus.  She underwent exploratory laparotomy  last night with resection of the sigmoid colon and colostomy formation.  There was a small area of perforation but no peritoneal soiling was  noted.  Creatinine here in the hospital is 0.7, and urine output has  been good at 800 mL over the last 12 hours.   OTHER PAST MEDICAL HISTORY:  Diastolic heart failure, hypertension,  history of angina, GERD, pancreatic cyst, depression anxiety.   PAST SURGICAL HISTORY:  Appendectomy.   MEDICATIONS:  1. Imuran 50 a day.  2. Prednisone 7.5 daily.  3. Aspirin 325 daily.  4. Lasix 40 b.i.d.  5. Doxepin 100 at bedtime.  6. Fosamax 70 mg weekly.  7. Lipitor 20 once a day.  8. Metoprolol tartrate 25 b.i.d.  9. Omeprazole 20 b.i.d.  10.Prozac 40 daily.  11.Restasis eye drops.  12.Verapamil 120 at bedtime.  13.Bactrim 50 mg twice a week.   ALLERGIES:  None.   SOCIAL HISTORY:  Family is with her today.  No alcohol, tobacco use.   REVIEW OF SYSTEMS:  Denies fever, chills, sweats, hearing loss, visual  change, sore throat, difficulty swallowing, shortness of breath, or  recent chest pain.  She currently is postop,  has abdominal soreness.  She has not had any bowel movement or flatus since surgery.  She has a  Foley catheter in place per the GU.  MUSCULOSKELETAL:  Denies any joint  pain or swelling.  NEUROLOGIC:  No focal numbness, weakness.  Denies any  history of stroke, TIA, or seizure.   PHYSICAL EXAMINATION:  VITAL SIGNS:  Temperature 99.4, blood pressure  120/50, pulse 80, respirations 16, and O2 sat 99% on 3 L.  GENERAL:  The patient is alert elderly adult female in no acute  distress.  SKIN:  Warm and dry.  HEENT:  PERRLA.  EOMI.  Throat is moist.  NECK:  Supple with no JVD.  CHEST:  Clear throughout.  CARDIAC:  Regular rate and rhythm without murmur, rub, or gallop.  ABDOMEN:  Left-sided ostomy with pink stoma.  There is a dressing in the  lower abdomen which was not removed.  Bowel sounds are absent.  Abdomen  is slightly distended.  EXTREMITIES:  No  peripheral edema.  NEUROLOGIC:  Alert and oriented x3.  She is sitting up in a chair.  No  focal motor deficits.   LABORATORY DATA:  Sodium 136, potassium 4.2, BUN 6, and creatinine 0.69.  Urinalysis negative.  White blood count 15,000, hemoglobin 10.7, and  platelets 292.   IMPRESSION/RECOMMENDATIONS:  1. Renal transplant with stable function.  Agree with IV steroids and      taper to prednisone 20 once a day.  Imuran has been ordered also      for when the patient is taking p.o.  2. Status post perforated viscus with sigmoid colon resection and      xolostomy formation.  On empiric antibiotics with IV Cipro and      Flagyl.  3. History of hypertension, getting IV metoprolol for now every 6      hours.  Holding p.o. blood pressure meds.  4. Volumes; euvolemic with good urine output.  Hold Lasix for now.  5. Prophylaxis; PPI, STDs, and subcu heparin.      Maree Krabbe, M.D.  Electronically Signed     RDS/MEDQ  D:  09/06/2008  T:  09/07/2008  Job:  [id number]

## 2010-07-08 NOTE — Discharge Summary (Signed)
Morgan Cooper, Morgan Cooper NO.:  [id number]   MEDICAL RECORD NO.:  [id number]          PATIENT TYPE:  INP   LOCATION:  6705                         FACILITY:  [facility]   PHYSICIAN:  Velora Heckler, MD      DATE OF BIRTH:  [birth year]   DATE OF ADMISSION:  09/05/2008  DATE OF DISCHARGE:  09/13/2008                               DISCHARGE SUMMARY   ADMITTING PHYSICIAN:  Lennie Muckle, MD   DISCHARGING PHYSICIAN:  Velora Heckler, MD   CONSULTATIONS:  Maree Krabbe, MD   PROCEDURES:  Exploratory laparotomy with sigmoid resection, mobilization  of the splenic flexure and colostomy by Dr. Bertram Savin on September 05, 2008.   REASON FOR ADMISSION:  Morgan Cooper is a 65 year old female with an acute  onset of abdominal pain on the day of admission at 7 a.m.  It awoke her  from sleep and she had associated nausea and vomiting.  She states that  at that time, she had pain that was 10/10 that was noted to be worse  with movement.  Please see admitting history and physical for further  details.   ADMITTING DIAGNOSES:  1. Perforated diverticulitis.  2. History of renal transplant.  3. Hypertension.  4. Reflux.  5. Hypercholesterolemia.   HOSPITAL COURSE:  At this time, the patient was admitted from the  emergency department, placed on IV fluids and IV antibiotics, and taken  to the operating room where she was found to have a perforated viscus of  the sigmoid colon.  Dr. Freida Busman did a sigmoid colectomy with colostomy.  Postoperatively, she was taken to an ICU Unit for observation.  On  postoperative day #1, she was doing okay with some dry staining on her  dressings.  Otherwise, she had an NG tube and postoperative ileus.  At  this time, a renal consult was obtained to help manage the patient's  transplanted kidney and to make sure that her kidney function remained  appropriate.  Over the next several days, the patient continued with a  postoperative ileus and by postoperative day  #3, the patient's NG tube  had come out.  At this time, she is not having any nausea and vomiting  and this was held out unless the patient had repeated nausea and  vomiting.  On postoperative day #4, the patient was feeling better with  no nausea and vomiting and at this time, she was started on sips of  clear liquids and instructed to mobilize.  By postoperative day #5, she  was doing well, passing flatus and continued to have no nausea and  vomiting and therefore, her diet was advanced as tolerated to a regular  diet.  By postoperative day 8, the patient was tolerating a regular diet  and had not had any renal problems during the hospitalization.  She did  have home health ordered for ostomy care.  Otherwise, the patient was  stable for discharge home.   DISCHARGE DIAGNOSES:  1. Perforated diverticulitis.  2. Status post laparotomy with sigmoid resection, mobilization of the  splenic flexure and colostomy.  3. Prior history of renal transplant.  4. Nephrolithiasis.  5. Hypertension.  6. Reflux disease.  7. Hypercholesterolemia.   She has informed she may continue all of her home medicines including:  1. Doxepin 100 mg at bedtime.  2. Aspirin 325 mg daily.  3. Fosamax 70 mg daily.  4. Imuran 50 mg daily.  5. Lipitor 20 mg daily.  6. Lopressor 25 mg two times a day.  7. Omeprazole 20 mg two times a day.  8. Prednisone 7.5 mg daily.  9. Prozac 40 mg daily.  10.Verapamil 120 mg at bedtime.  11.Bactrim two times a day.  12.She was given prescriptions for Vicodin 5/325 one to two tablets q.      4h. p.r.n. pain.  13.Cipro 500 mg twice a day for a week.   DISCHARGE INSTRUCTIONS:  The patient may resume a regular diet.  She is  to increase her activity slowly and she may walk with assistance.  She  may shower, however, she is not to bathe.  She is not to lift anything  heavy for the next 6 weeks.  She is to have ostomy care per Home Health.  Otherwise, she is to return to  the [facility] office in 1 week for staples out.  Otherwise, she is to follow up with Dr. Freida Busman.      Morgan Cape, PA      Velora Heckler, MD  Electronically Signed    KEO/MEDQ  D:  10/07/2008  T:  10/08/2008  Job:  [id number]   cc:   Maree Krabbe, M.D.  Lennie Muckle, MD

## 2010-07-08 NOTE — H&P (Signed)
[place]. [facility]  Patient:    Morgan Cooper, Morgan Cooper Visit Number: [id number] MRN: [id number]          Service Type: MED Location: 5500 [id number] 01 Attending Physician:  Brandy Hale Dictated by:   Angelia Mould. Derrell Lolling, M.D. Admit Date:  03/23/2001   CC:         Aram Beecham B. Eliott Nine, M.D.  Francisca December, M.D.  [facility]   History and Physical  CHIEF COMPLAINT:  Abdominal pain and vomiting.  HISTORY OF PRESENT ILLNESS:  This is a 65 year old white female, who is status post renal transplantation in 1984.  She awoke at 1 a.m. with lower abdominal pain, rather diffuse, is now more localized to the right lower quadrant.  She has vomited at least four times.  She has been a little bit more constipated lately.  She has not had any chills, but admitted fever to 101 when she came to the emergency room.  Of note is that she has been treated for urinary tract infection several times in the last year, and she had similar lower abdominal pain.  She states that her urinalysis was fairly unremarkable, questioning whether she might have had attacks of appendicitis previously.  She was evaluated by the emergency department physicians.  A CT scan shows a functioning renal transplant in the left iliac fossa and inflamed, but not ruptured, appendix in the right lower quadrant.  She has a small hiatal hernia.  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  PAST HISTORY: 1. Left renal transplant, 1984. 2. 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 3. She has hypertension.  She was told by Dr. Corliss Marcus that she has    "diastolic dysfunction."  CURRENT MEDICATIONS: 1. Imuran 75 mg at bedtime. 2. Prednisone 7.5 mg daily. 3. Prozac 20 mg daily. 3. Zocor 20 mg daily. 4. Prevacid 30 mg daily. 5. Metoprolol 25 mg b.i.d. 6. Doxepin 50 mg q.h.s. 7. Lasix 20 mg b.i.d. 8. Verapamil 180  mg two tablets at bedtime.  DRUG ALLERGIES:  None known.  FAMILY HISTORY:  Mother died at age 81, had stroke, had hypertension, had leukemia.  Father died at age 16, had coronary artery disease, liver cancer, and alcohol abuse.  She has six sisters and two brothers.  Two sisters have died of lung cancer and one is living with diabetes.  She has two brothers, one died of a ruptured abdominal aortic aneurysm.  SOCIAL HISTORY:  The patient lives in [place] with her sister.  She is a widow.  She has two daughters.  She denies the use of alcohol or tobacco.  REVIEW OF SYSTEMS:  All systems are reviewed and are noncontributory except as described above.  PHYSICAL EXAMINATION:  GENERAL:  Pleasant, alert, cooperative white female in moderate distress.  VITAL SIGNS:  Temperature 101, pulse 69, respirations 18, blood pressure 128/53.  HEENT:  Sclerae clear.  Extraocular movements intact.  Face is slightly puffy. Oropharynx clear.  NECK:  She has a large nevus on the right malar area.  The neck exam is unremarkable.  No bruit, no mass.  LUNGS:  Clear to auscultation.  HEART:  Regular rate and rhythm, no murmur.  BREASTS:  Exam not done.  ABDOMEN:  Borderline distended, hypoactive bowel sounds.  Tender with involuntary guarding in the right lower quadrant, fairly soft and nontender elsewhere.  She has a well-healed scar in the left  lower quadrant extending up into the left flank, presumably from her previous renal transplant.  There are no hernias noted.  EXTREMITIES:  No edema.  Radial, femoral, and posterior tibial pulses are intact.  NEUROLOGIC:  Grossly within normal limits.  ADMISSION DATA:  CT scan as described above.  White blood cell count 12,200, hemoglobin 10.8.  Complete metabolic panel reveals a potassium of 3.1, glucose of 115, BUN 15, creatinine 0.9.  Normal liver function tests.  Lipase 52.  Urinalysis looks normal.  EKG pending.  IMPRESSION: 1. Acute  appendicitis. 2. Status post renal transplant, left iliac fossa. 3. Possible mass, tail of pancreas, doubt that this is causing her pain today. 4. Hypertension. 5. History of "diastolic dysfunction."  PLAN:  The patient will be taken to the operating room and will undergo diagnostic laparoscopy and appendectomy.  She is advised of the indications and details of this surgery.  She is advised of risks and complications, including but not limited to, bleeding, infection, conversion to open laparotomy, injury to her kidney transplant, injury to adjacent organs, wound problems such as infection or hernia, and other unforeseen problems.  She seems to understand these issues well at this time.  All of her questions are answered.  She is comfortable with this plan. Dictated by:   Angelia Mould. Derrell Lolling, M.D. Attending Physician:  Brandy Hale DD:  03/23/01 TD:  03/23/01 Job: [id number]

## 2010-07-11 ENCOUNTER — Encounter: Payer: Self-pay | Admitting: Gastroenterology

## 2010-07-12 NOTE — Op Note (Signed)
[place]. [facility]  Patient:    Morgan Cooper, Morgan Cooper Visit Number: [id number] MRN: [id number]          Service Type: MED Location: 5500 [id number] 01 Attending Physician:  Brandy Hale Dictated by:   Angelia Mould. Derrell Lolling, M.D. Proc. Date: 03/23/01 Admit Date:  03/23/2001 Discharge Date: 03/25/2001   CC:         Aram Beecham B. Eliott Nine, M.D.             Francisca December, M.D.             [facility]                           Operative Report  PREOPERATIVE DIAGNOSIS:  Acute appendicitis.  POSTOPERATIVE DIAGNOSIS:  Acute appendicitis.  OPERATION PERFORMED:  Laparoscopic appendectomy.  SURGEON:  Angelia Mould. Derrell Lolling, M.D.  OPERATIVE INDICATIONS:  This is a 65 year old white female who is 18 years status post renal transplantation.  She has a functioning kidney in the left lower quadrant.  She presented with a 12 hour history of right lower quadrant pain, nausea, and vomiting.  Examination revealed tenderness in the right lower quadrant.  White blood cell count is 12,000.  She is taking prednisone and Imuran.  CT scan was consistent with acute appendicitis.  She is brought to the operating room urgently.  OPERATIVE FINDINGS:  The patient had acute appendicitis.  There was an inflammatory exudate on the appendix which is quite long and partially retrocecal, but there was no evidence of gangrene or rupture.  The right colon and small bowel looked fine.  The transplanted kidney in the left lower quadrant was obscured by retroperitoneal fat.  Trocar placement and removal was uneventful due to the lack of significant adhesions.  DESCRIPTION OF PROCEDURE:  Following the induction of general endotracheal anesthesia, the patients abdomen was prepped and draped in a sterile fashion. A vertically oriented incision was made at the superior rim of the umbilicus. The fascia was incised in the midline and the abdominal cavity entered under direct vision.  A 10 mm  Hasson trocar was inserted and secured with a pursestring suture of 0 Vicryl.  Pneumoperitoneum was created.  The cannula was inserted with visualization and findings as described above.  The patient was positioned in the Trendelenburg position and rolled to the left.  A 5 mm trocar was placed in the right upper quadrant and a 12 mm trocar placed in the suprapubic area.  Great care was taken in placement of the trocars.  With traction and counter traction, we were slowly able to tease the body and the tip of the appendix out of its retrocecal location.  We were able to gently tease one loop of small bowel off of the appendix atraumatically. We were able to grasp the tip of the appendix and place an endoloop tie around this.  We then elevated the appendix and pulled it medially.  We divided some of the lateral peritoneal attachments which made the appendix quite mobile. We transected the mesoappendix with the harmonic scalpel and that transection went quite well and it was quite hemostatic.  We ultimately were able to take the dissection step-wise all the way back to the insertion site of the appendix on the cecum and this area was well-visualized.  We transected the appendix at the cecum with an Endo-GIA stapling device.  The appendix was then placed in a specimen bag and  removed.  The operative field was irrigated.  The staple line looked healthy and secure.  There was no bleeding whatsoever.  All the irrigation fluid was removed.  The patient was repositioned supine and the trocars were removed under direct vision, and there was no bleeding from the trocar sites.  The pneumoperitoneum was released and all the trocars removed.  The fascia at the umbilicus and the fascia in the suprapubic port site were closed with 0 Vicryl sutures.  The incisions were irrigated with saline and the skin closed with subcuticular sutures of 4-0 Vicryl and Steri-Strips.  Clean bandages were placed and the  patient taken to the recovery room in stable condition.  The estimated blood loss was about 20 cc.  Complications none.  Sponge, needle, and instrument counts were correct. Dictated by:   Angelia Mould. Derrell Lolling, M.D. Attending Physician:  Brandy Hale DD:  03/23/01 TD:  03/25/01 Job: [id number]

## 2010-07-12 NOTE — Discharge Summary (Signed)
   NAMEJANVI, Morgan Cooper                           ACCOUNT NO.:  [id number]   MEDICAL RECORD NO.:  [id number]                   PATIENT TYPE:  INP   LOCATION:  5525                                 FACILITY:  [facility]   PHYSICIAN:  Garnetta Buddy, M.D.                DATE OF BIRTH:  09-24-1945   DATE OF ADMISSION:  11/21/2001  DATE OF DISCHARGE:  11/23/2001                                 DISCHARGE SUMMARY   ADMISSION DIAGNOSES:  1. Fever.  2. Hypertension.  3. Renal transplant.   DISCHARGE DIAGNOSES:  1. Fever resolved, suspect urinary tract infection, cultures pending.  2. Hypertension.  3. Renal transplant.   HISTORY OF PRESENT ILLNESS:  Briefly, a 65 year old white female status post  renal transplant at [place], [place], is admitted now with fever  and hypotension.  The patient has history of recurrent UTIs in the past and  is followed by Dr. Aldean Ast, the urologist.  She also suffers from nausea,  back pain, but no vomiting.   LABS ON ADMISSION:  White count 11,900, hemoglobin 12.6, platelets 262,000.  Sodium 134, potassium 4.1, chloride 103, CO2 22, glucose 91, creatinine 1.0,  BUN 12, calcium 9.6.  Albumin 3.5.  LFTs within normal limits.   HOSPITAL COURSE:  The patient was admitted and placed on her usual  medications.  Blood was cultured and IV fluids were started at 75 cc an  hour.  She was started on Zosyn IV.  Chest x-ray showed mild chronic lung  disease, otherwise no acute abnormalities.  Urinalysis indicated possible  urinary tract infection with 3 to 6 wbc per high powered field, small amount  of leukocyte esterase, negative nitrite.  Urine and blood cultures remain  negative at the time of discharge.  Zosyn was changed to Augmentin 875 mg  twice a day to complete a total 10-day course of antibiotics.  A follow-up  urine culture will be done as an outpatient one to two weeks after  completion of antibiotics.  She will call Dr. Marland Mcalpine office to schedule  a  follow-up appointment.   DISCHARGE MEDICATIONS:  1. Prozac 20 mg daily.  2. Lipitor 10 mg daily.  3. Prevacid 30 mg daily.  4. Imuran 75 mg daily.  5. Covera 125 mg daily.  6. Nephro-Vite one daily.  7. Metoprolol 25 mg b.i.d.  8. Prednisone 7.5 mg daily.  9. Augmentin 875 mg b.i.d. for eight more days.     Morgan Cooper, P.A.                      Garnetta Buddy, M.D.   RRK/MEDQ  D:  12/08/2001  T:  12/09/2001  Job:  [id number]

## 2010-07-12 NOTE — Discharge Summary (Signed)
. [facility]  Patient:    TARINI, CARRIER Visit Number: [id number] MRN: [id number]          Service Type: MED Location: 5500 5526 01 Attending Physician:  Garnetta Buddy Dictated by:   Blanch Media, M.D. Admit Date:  06/04/2001   CC:         Rozanna Boer., M.D.   Discharge Summary  DATE OF BIRTH:  1945/10/23.  DISCHARGE MEDICATIONS:  1. Augmentin 875 mg b.i.d. x7 days.  2. Doxepin 50 mg p.o. q.h.s.  3. Prednisone 7.5 mg p.o. q.d.  4. Prozac 20 mg q.d.  5. Imuran 75 mg q.a.m.  6. Imuran 50 mg q.h.s., to resume on April 22.  7. Lasix 20 mg b.i.d.  8. Zocor 20 mg q.h.s.  9. Verapamil 240 q.d. 10. Lopressor 25 mg b.i.d.  FOLLOW-UP INSTRUCTIONS:  The patient is to call [facility] to schedule a follow-up appointment with Dr. Hyman Hopes.  Patient also to call [facility] to schedule a follow-up appointment with her primary care physician.  DISCHARGE DIAGNOSES: 1. Acute pyelonephritis. 2. Hypertension. 3. Bradycardia. 4. Status post kidney transplant in 1984.  HISTORY OF PRESENT ILLNESS:  Ms. Fredenburg is a 65 year old white female status post living related kidney transplant in 1984.  The patient had a history of glomerulonephritis at the age of 70.  The patient went to the [facility] on the day of admission for routine follow-up.  She complained of a one-day history of dysuria and cloudy urine and a pain in her left lower quadrant.  She also had nausea and photophobia, headache, chills.  Her last UTI was in December 2002 and grew Klebsiella.  PHYSICAL EXAMINATION:  VITAL SIGNS:  Blood pressure 140/90, pulse 90, temperature 101.6.  Pertinent positives and negatives on exam:  CHEST:  The patient had bibasilar rales.  CARDIAC:  A systolic murmur.  ABDOMEN:  She had mild tenderness of the left lower quadrant  ADMISSION LABORATORY DATA:  Sodium 136, potassium 3.4, chloride 102, CO2 23, glucose 95, BUN 10,  creatinine 1, calcium 10.1.  Total bilirubin 1.3, phosphorus 1.9, alkaline phosphatase 88, ALT 13, AST 17, albumin 3.5.  White blood cells 9.1, hemoglobin 12.3, platelets 242.  HOSPITAL COURSE:  #1 - ACUTE PYELONEPHRITIS:  The patient was admitted to 5700 and started on Tequin IV.  She was also continued on Phenergan and Zofran for her nausea.  She remained febrile with temperatures in the 102-103 degree, so on April 17, she was taken off Tequin and started on Unasyn.  She defervesced and was switched to Augmentin on the 20th and remained afebrile.  Urine culture from the 15th in the clinic grew E. coli and was pansensitive.  Since patient has had frequent UTIs, urology was consulted to help determine if there was an anatomical reason for frequent UTIs.  Her renal ultrasound that was obtained on admission showed no evidence of hydronephrosis, stone, or mass.  Urology saw the patient on the 16th and recommended an outpatient cystoscopy if needed.  At the time of discharge the patient was afebrile, had no leukocytosis.  She was off antiemetics and able to tolerate a regular diet.  She is to complete seven days of Augmentin as an outpatient.  #2 -  HYPERTENSION:  The patients antihypertensives were held on admission, as she was hypotensive.  Prior to discharge, the patients home antihypertensives were added back without problem.  #3 -  BRADYCARDIA:  Patient was bradycardic in  the high 40s-low 50s prior to discharge.  As she was on both a beta blocker and a calcium channel blocker, it was decided to reduce her dose of the verapamil from 360 to 240 mg q.d. The patients heart rate and further management to be continued as an outpatient.  #4 -  STATUS POST KIDNEY TRANSPLANT:  The patient was on both Imuran and low-dose prednisone.  As she was not turning around quickly in regard to her fever and symptomatology, it was decided on hospital day 3 to decrease her Imuran from b.i.d. to q.d.  dosing.  She was maintained on her Imuran 75 mg q.a.m. but her evening dose was held for a week.  She is to resume her b.i.d. dosing as an outpatient. Dictated by:   Blanch Media, M.D. Attending Physician:  Garnetta Buddy DD:  06/10/01 TD:  06/10/01 Job: [id number]

## 2010-07-12 NOTE — Op Note (Signed)
Morgan Cooper, Morgan Cooper                           ACCOUNT NO.:  [id number]   MEDICAL RECORD NO.:  [id number]                   PATIENT TYPE:  AMB   LOCATION:  DSC                                  FACILITY:  [facility]   PHYSICIAN:  Mila Homer. Sherlean Foot, M.D.              DATE OF BIRTH:  1945/10/13   DATE OF PROCEDURE:  09/16/2003  DATE OF DISCHARGE:                                 OPERATIVE REPORT   SURGEON:  Mila Homer. Sherlean Foot, M.D.   ASSISTANT:  Jamelle Rushing, P.A.   ANESTHESIA:  General.   PREOPERATIVE DIAGNOSIS:  Left ulnar hypertrophic nonunion.   POSTOPERATIVE DIAGNOSIS:  Left ulnar hypertrophic nonunion.   PROCEDURE:  Left ulnar open nonunion takedown, bone grafting, and open  reduction and internal fixation.   INDICATIONS FOR PROCEDURE:  The patient is a 65 year old patient that is  over six months status post ORIF of a left forearm fracture.  The radius  healed, the ulna did not.  Informed consent was obtained.   DESCRIPTION OF PROCEDURE:  The patient was laid supine and administered  general anesthesia.  The left upper extremity was prepped and draped in the  usual sterile fashion.  The old incision was used and made with a #10 blade.  A 15 blade was used to subperiosteally dissect around the hypertrophic  nonunion.  Hand osteotomes were used to remove the hypertrophic callus.  The  nonunion site was identified and rongeurs, curets, and osteotomes were used  to clean out the nonunion of all scar tissue and get down to good bleeding  bone.  I save a lot of the good cancellous hypertrophic bone for bone  grafting and had more than enough to bone graft the nonunion site.  Once I  had done that, I placed an eight-hole DCP plate over the ulna, filled the  distal three holes to the previous screw holes, plus an additional new screw  hole most distally in the plate.  I then skipped a hole and went in the  proximal hole and did a compression mode screw and then filled the proximal  two  holes of the plate.  This offered nice compression across the bone  grafted site.  I then laid some DBX Grafton pressing it into the fracture  site and laying along the anterior and posterior cortices.  I then closed  with interrupted 0 and 2-0 Vicryl sutures and skin staples, dressed with  Adaptic, 4x4's, Webril, and a posterior splint.  The patient tolerated the  procedure well, complications were none.  Drains were none.                                               Mila Homer. Sherlean Foot, M.D.    SDL/MEDQ  D:  09/16/2003  T:  09/16/2003  Job:  [id number]

## 2010-07-12 NOTE — Op Note (Signed)
Morgan Cooper, Morgan Cooper                           ACCOUNT NO.:  [id number]   MEDICAL RECORD NO.:  [id number]                   PATIENT TYPE:  INP   LOCATION:  2550                                 FACILITY:  [facility]   PHYSICIAN:  Mila Homer. Sherlean Foot, M.D.              DATE OF BIRTH:  [birth year]   DATE OF PROCEDURE:  01/24/2003  DATE OF DISCHARGE:  01/24/2003                                 OPERATIVE REPORT   PREOPERATIVE DIAGNOSIS:  Left both-bone forearm fracture (radius and ulnar  shaft, closed).   POSTOPERATIVE DIAGNOSIS:  Left both-bone forearm fracture (radius and ulnar  shaft, closed).   PROCEDURE:  Left forearm open reduction and internal fixation of the radius  and ulnar shaft.   ANESTHESIA:  General.   SURGEON:  Mila Homer. Sherlean Foot, M.D.   ASSESSMENT:  Jamelle Rushing, P.A.   INDICATION FOR PROCEDURE:  The patient is a 65 year old white female who  fell down from the stairs.  She was taken to the emergency room by EMS and  was found to have a radius and ulnar shaft fracture.  I was consulted and  informed consent was obtained.   DESCRIPTION OF PROCEDURE:  The patient laid supine and administered general  anesthesia.  The left upper extremity was prepped and draped in the usual  sterile fashion.  A volar incision was made directly over the fracture.  The  brachioradialis was identified and elevated and the neurovascular structures  underneath that were protected, and I went through the pronator where the  fracture was poking through.  I followed the hematoma down.  I rinsed out  the hematoma with sterile irrigation and then used a periosteal elevator to  dissect out the bones and strip it free of periosteum at the fracture site  and for about 3 cm proximal and distally.  I used Bennett retractors to hold  the neurovascular bundle and the brachioradialis radially and the pronator  and other muscles medially.  I then placed a small-fragment DCP plate to the  volar aspect of the  radial shaft after obtaining anatomic reduction.  I then  placed the first screw distally in standard fashion, then placed the first  proximal screw in the compression mode, which nicely compressed the  fracture.  I then placed the distal two screws, then the proximal two  screws.  I then irrigated, closed with interrupted 0 and 2-0 Vicryl sutures,  and Steri-Strips.  I then went into flexion and made an incision directly  over the fracture on the ulnar side of the arm.  I went down to bone and  then used subperiosteal  elevation to free up 3 cm proximal and distal.  I  used the baby Lowman clamps to obtain anatomic reduction and then fashioned  a six-hole plate to the lateral border of the ulna and filled it proximally  and distally with bicortical screws.  I then  used the C-arm imaging to make  sure that we had achieved anatomic reduction and alignment as well as  rotation.  I then irrigated out this wound and closed with interrupted 0 and  2-0 Vicryl sutures and skin staples.  I then placed with Xeroform, dressing  sponges, a sugar tong splint, and an ACE wrap, let the tourniquet down.  This was at 50 minutes.   COMPLICATIONS:  None.   DRAINS:  None.   ESTIMATED BLOOD LOSS:  Minimal.                                               Mila Homer. Sherlean Foot, M.D.    SDL/MEDQ  D:  01/24/2003  T:  01/26/2003  Job:  [id number]

## 2010-07-12 NOTE — Discharge Summary (Signed)
[name], [name]                           ACCOUNT NO.:  [id number]   MEDICAL RECORD NO.:  [id number]                   PATIENT TYPE:  INP   LOCATION:  2550                                 FACILITY:  [facility]   PHYSICIAN:  Mila Homer. Sherlean Foot, M.D.              DATE OF BIRTH:  1945-03-25   DATE OF ADMISSION:  01/24/2003  DATE OF DISCHARGE:  01/24/2003                                 DISCHARGE SUMMARY   CHIEF COMPLAINT:  Left arm pain.   HISTORY OF PRESENT ILLNESS:  [name] is a 65 year old white female who  fell down two steps and landed on her left arm and breast.  She presented to  the ER with left wrist pain and deformity.  The patient had no other  injuries and was in mild discomfort.  The patient denied any loss of  consciousness, injuries to her neck or back pain.  She was transported to  [facility] ER by EMS.   ADMISSION DIAGNOSES:  1. Left radius ulna fracture.  2. Kidney transplant.  3. Hypertension.  4. Bradycardia.  5. Stable angina.  6. Hyperlipidemia.  7. History of pyelonephritis.  8. Gastroesophageal reflux disease.  9. Anxiety/depression.  10.      Diastolic dysfunction.   DISCHARGE DIAGNOSES:  1. Status post left arm open reduction, internal fixation of her radius and     ulnar shaft.  2. Kidney transplant.  3. Hypertension.  4. Bradycardia.  5. Stable angina.  6. Hyperlipidemia.  7. History of pyelonephritis.  8. Diastolic dysfunction.  9. Gastroesophageal reflux disease.  10.      Anxiety and depression.   ALLERGIES:  No known drug allergies.   MEDICATIONS:  1. Verapamil 120 mg q.d.  2. Toprol 25 mg b.i.d.  3. Prednisone 5 mg q.d.  4. Lasix 40 mg b.i.d.  5. __________ 75 mg q.d.  6. Ocuvite 2 p.o. b.i.d.  7. Aciphex 20 mg q.d.  8. Imuran 50 mg q.d.  9. Lipitor 10 mg q.d.   SURGICAL PROCEDURE:  The patient was taken to the operating room on January 24, 2003 by Dr. Mila Homer. Lucey and assisted by Jamelle Rushing, P.A.  The  patient was placed under general anesthesia and the left open reduction,  internal fixation of the radius and ulnar shaft was performed.  The patient  tolerated the procedure well and returned to recovery in good, stable  condition.   HOSPITAL COURSE:  The patient was discharged from recovery once awake and  comfortable.   ROUTINE LABS ON ADMISSION:  White blood count is 9.4, hemoglobin 11.5,  hematocrit 33.7, platelets 290.  Coags:  PT at 12.9, INR 1.0, PTT 29.  Routine chemistries; sodium 137, potassium 3.2, chloride 108, bicarbonate  23, glucose 99, BUN 11, creatinine 0.8.   X-RAYS:  Two views of the left forearm showed fractures of the left ulna and  radius  mid and proximal two thirds of the shaft.  There was approximately  one shaft with a volar displacement of the radius fracture and there was  slight radial angulation of both fractures.  Wrist and elbow joints were  maintained.   EKG showed marked sinus bradycardia.  Heart rate 49 beats per minute.  P-R  interval was 204.  PRT axis 48, 36 and 45.   DISCHARGE INSTRUCTIONS:  The patient was to resume home medications with the  following addition:  Percocet 5 mg one to two tablets every four to six  hours as needed for pain.   ACTIVITY:  The patient was to keep her arm elevated and use ice for  discomfort.  When the patient was ambulatory she needed to keep the left arm  in a sling.   WOUND CARE:  The patient was to keep the splint clean and dry.   CONDITION ON DISCHARGE:  The patient was discharged home in good, stable  condition.   FOLLOW UP:  The patient was to follow up with Dr.  Sherlean Foot later that week.  The patient was to call Dr. Tobin Chad office at [phone] for an appointment.      Morgan Cooper, P.A.                       Mila Homer. Sherlean Foot, M.D.    GC/MEDQ  D:  03/19/2003  T:  03/20/2003  Job:  [id number]

## 2010-07-12 NOTE — Consult Note (Signed)
[place]  Patient:    Morgan Cooper, Morgan Cooper Visit Number: [id number] MRN: [id number]          Service Type: MED Location: [id number] Attending Physician:  Garnetta Buddy Dictated by:   Rozanna Boer., M.D. Proc. Date: 06/05/01 Admit Date:  06/04/2001                            Consultation Report  REASON FOR CONSULTATION: Pyelonephritis with history of kidney transplant.  HISTORY OF PRESENT ILLNESS: This 65 year old female was admitted on June 04, 2001 with fever and presumed pyelonephritis. She had a history of glomerulonephritis about [age] years of age. She has living related kidney transplant in 1984 with good results. She has had stable allograft function. She has a history of hypertension but it has been under fairly good control by history. I was asked to see her because of "recurrent urinary tract infections". By history in late October and in early December, she called in with a low grade fever with some lower abdominal pain and was given antibiotics for presumed urinary infection. She was not seen and the urine was not checked at that time. She had an appendectomy for acute appendicitis in February and was on antibiotics at that time and came in with her third episode of "UTI" this admission. She had on outside urine, pyuria and bacteriuria by history but the culture is so far negative 24 hours later. It is not clear that she had any cultures on the other exams but was treated empirically. She also has a history of GERD followed by Dr. 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 She had a colonoscopy in 1995 with polypectomy of the proximal cecum. She has a history of diastolic dysfunction and is followed by Dr. Ty Hilts. She had a normal cardiolite study recently. She has a history of anxiety depression as well.  MEDICATIONS: Doxepin 50 mg q.h.s., Metoprolol 25 mg b.i.d., Prednisone 7.5 mg daily, Zocor 20 mg daily,  Covina 360 mg daily, Imuran 75 mg am and 50 mg pm, Prozac 20 mg daily, and Lasix 20 mg b.i.d.  ALLERGIES: None.  SOCIAL HISTORY: Does not smoke or drink alcohol.  FAMILY HISTORY: She is widowed. Lives with her sister. Has two daughters.  PHYSICAL EXAMINATION:  GENERAL: The patient is a somewhat nervous appearing white female lying quietly in bed. She does not look cachectic but does feel warm to touch.  ABDOMEN: Soft and benign with scars from recent appendectomy, laparoscopically and the left renal allograft is palpable in the left lower quadrant. This seems to be nontender. No CVA pain. She still has her native kidneys by history. She has been voiding fairly clear urine at this time. No dysuria or other irritated bladder symptoms.  LABORATORY DATA: Normal protime. White count 9,100.  Hemoglobin 12.3, hematocrit 35.3. Creatinine 1.0. BUN 10. Electrolytes normal. Liver function tests are also within normal limits. Bilirubin is slightly high at 1.3.  IMPRESSION: 1. FUO. 2. History of "recurrent UTIs but undocumented". 3. Almost 20 year history of functioning living related renal allograft with    normal renal function. 4. 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 5. Recent appendicitis.  RECOMMENDATIONS: I would certainly like to document that she really does have indeed recurrent UTIs with cultures and this could be done as an outpatient. I will be glad to see her as an outpatient and do cystos, etc., if  necessary to determine why she is indeed having UTIs if she is. But I think with evidence lacking this, I think an infectious disease consult which she is in the hospital would be indicated.  I would be glad to see her when the present episode resolves as an outpatient procedure. Dictated by:   Rozanna Boer., M.D. Attending Physician:  Garnetta Buddy DD:  06/05/01 TD:  06/06/01 Job: [id number]

## 2010-07-13 ENCOUNTER — Other Ambulatory Visit (HOSPITAL_COMMUNITY): Payer: Self-pay | Admitting: Internal Medicine

## 2010-07-13 DIAGNOSIS — Z1231 Encounter for screening mammogram for malignant neoplasm of breast: Secondary | ICD-10-CM

## 2010-07-22 ENCOUNTER — Ambulatory Visit
Admission: RE | Admit: 2010-07-22 | Discharge: 2010-07-22 | Disposition: A | Discharge status: Home / Self Care | Payer: Medicare Other | Source: Ambulatory Visit | Attending: Internal Medicine | Admitting: Internal Medicine

## 2010-07-22 ENCOUNTER — Ambulatory Visit (HOSPITAL_COMMUNITY): Payer: Medicare Other

## 2010-07-22 DIAGNOSIS — Z1231 Encounter for screening mammogram for malignant neoplasm of breast: Secondary | ICD-10-CM

## 2010-07-26 ENCOUNTER — Telehealth: Payer: Self-pay | Admitting: Gastroenterology

## 2010-07-26 NOTE — Telephone Encounter (Signed)
Pt scheduled for egd with balloon dil at [facility] 09/02/10@12 :30pm, pt to arrive at 11:30am. Pt aware of appt date and time. Instructions mailed to pt.

## 2010-09-02 ENCOUNTER — Ambulatory Visit (HOSPITAL_COMMUNITY)
Admission: RE | Admit: 2010-09-02 | Discharge: 2010-09-02 | Disposition: A | Discharge status: Home / Self Care | Payer: Medicare Other | Source: Ambulatory Visit | Attending: Gastroenterology | Admitting: Gastroenterology

## 2010-09-02 ENCOUNTER — Encounter: Payer: Medicare Other | Admitting: Gastroenterology

## 2010-09-02 DIAGNOSIS — K222 Esophageal obstruction: Secondary | ICD-10-CM

## 2010-09-02 DIAGNOSIS — G609 Hereditary and idiopathic neuropathy, unspecified: Secondary | ICD-10-CM | POA: Insufficient documentation

## 2010-09-02 DIAGNOSIS — R1310 Dysphagia, unspecified: Secondary | ICD-10-CM

## 2010-09-02 DIAGNOSIS — Z940 Kidney transplant status: Secondary | ICD-10-CM | POA: Insufficient documentation

## 2010-09-02 DIAGNOSIS — K5730 Diverticulosis of large intestine without perforation or abscess without bleeding: Secondary | ICD-10-CM | POA: Insufficient documentation

## 2010-09-02 DIAGNOSIS — I10 Essential (primary) hypertension: Secondary | ICD-10-CM | POA: Insufficient documentation

## 2010-09-07 ENCOUNTER — Ambulatory Visit (INDEPENDENT_AMBULATORY_CARE_PROVIDER_SITE_OTHER): Payer: Medicare Other | Admitting: Surgery

## 2010-09-07 ENCOUNTER — Encounter (INDEPENDENT_AMBULATORY_CARE_PROVIDER_SITE_OTHER): Payer: Self-pay | Admitting: Surgery

## 2010-09-07 NOTE — Progress Notes (Signed)
Chief Complaint  Patient presents with  . Other    Hypercalcemia, eval for parathyroid disease    HISTORY: The patient is a 65 year old white female well known to our surgical practice. She had been noted by her primary physician to have elevated serum calcium levels over the past several years. Recent laboratory studies showed an elevated calcium of 10.6 and an elevated intact PTH level of 144. After review with her nephrologist, and these levels were not felt to be related to renal disease. Patient has undergone a renal transplant in 1983. This was a living related donor, her sister. She has had a few complications. Patient's family has noted increased irritability. The patient complains of bone and joint pain. She complains of abdominal pain. Other than laboratory work she has had no further workup at present.  Patient has no prior history of thyroid or parathyroid disease. She has had no prior head or neck surgery. There is no family history of parathyroid disease. There is no family history of other significant endocrinopathy.   Past Medical History  Diagnosis Date  . Renal failure   . Diverticulitis of large intestine with perforation Hx colectomy  . Hypertension   . Barrett's esophagus   . Hyperlipemia   . Esophageal stricture   . CVA (cerebral infarction)     Mini Strokes   . Pancreatic cyst   . Gastroparesis   . Depression   . Heart failure, diastolic, chronic   . Persistent headaches   . Rapid heart rate     some times  . GERD (gastroesophageal reflux disease)   . Forgetfulness   . Irritable   . Sleep difficulties   . Bone pain   . Osteoporosis   . Fatigue   . Disturbed concentration   . Itch     skin  . Nausea     little vomitting     Current outpatient prescriptions:aspirin 325 MG tablet, Take 325 mg by mouth daily.  , Disp: , Rfl: ;  atorvastatin (LIPITOR) 20 MG tablet, Take 20 mg by mouth daily. , Disp: , Rfl: ;  azaTHIOprine (IMURAN) 50 MG tablet, Take 50 mg  by mouth daily.  , Disp: , Rfl: ;  cycloSPORINE (RESTASIS) 0.05 % ophthalmic emulsion, Place 1 drop into both eyes 2 (two) times daily.  , Disp: , Rfl:  DULoxetine (CYMBALTA) 60 MG capsule, Take 60 mg by mouth daily.  , Disp: , Rfl: ;  furosemide (LASIX) 40 MG tablet, Take 40 mg by mouth 2 (two) times daily. 1/2 twice daily , Disp: , Rfl: ;  hydrALAZINE (APRESOLINE) 25 MG tablet, Take 25 mg by mouth daily.  , Disp: , Rfl: ;  HYDROcodone-acetaminophen (NORCO) 10-325 MG per tablet, Take 1 tablet by mouth every 6 (six) hours as needed.  , Disp: , Rfl:  metoprolol (LOPRESSOR) 50 MG tablet, Take 50 mg by mouth 2 (two) times daily. 1/2 two times daily , Disp: , Rfl: ;  Multiple Vitamins-Minerals (OCUVITE PO), Take by mouth. Twice daily, Disp: , Rfl: ;  omeprazole (PRILOSEC) 20 MG capsule, Take 20 mg by mouth daily. Twice daily , Disp: , Rfl: ;  predniSONE (DELTASONE) 5 MG tablet, Take 5 mg by mouth daily. One and 1/2 daily , Disp: , Rfl:  verapamil (VERELAN PM) 120 MG 24 hr capsule, Take 120 mg by mouth.  , Disp: , Rfl: ;  BuPROPion HCl (WELLBUTRIN PO), Take by mouth. Take 1 tablet twice daily (unsure of strength) , Disp: , Rfl: ;  doxepin (SINEQUAN) 100 MG capsule, Take 100 mg by mouth. Once daily , Disp: , Rfl: ;  glycopyrrolate (ROBINUL-FORTE) 2 MG tablet, Take 1 tablet (2 mg total) by mouth 3 (three) times daily as needed., Disp: 30 tablet, Rfl: 2 Hyoscyamine Sulfate (HYOMAX-DT) 0.375 MG TBCR, If symptoms improve within 5 days then use as needed, Disp: 60 each, Rfl: 0 Current facility-administered medications:0.9 %  sodium chloride infusion, 500 mL, Intravenous, Continuous, Louis Meckel, MD   No Known Allergies   Family History  Problem Relation Age of Onset  . Colon cancer Neg Hx   . Lung cancer Sister      History  Substance Use Topics  . Smoking status: Former Smoker    Quit date: 05/30/1989  . Smokeless tobacco: Never Used  . Alcohol Use: No     PERTINENT POSITIVES FROM REVIEW OF  SYSTEMS: Denies nephrolithiasis. Patient notes bone and joint pain. Family notes irritability. Patient notes intermittent abdominal pain.   EXAM: Filed Vitals:   09/07/10 1038  BP: 162/92  Pulse: 68  Temp: 97.7 F (36.5 C)   HEENT shows her to be normocephalic. Sclerae clear. Pupils equal and reactive. Dentition fair. Mucous membranes moist. First normal. Neck is symmetric. Palpation reveals no thyroid nodularity. No mass. No lymphadenopathy. No supraclavicular masses. Lungs are clear to auscultation bilaterally without rales rhonchi or wheezes Cardiac exam shows regular rate and rhythm Extremities are nontender without edema Neurologically the patient is alert and oriented without gross focal neurologic deficit. No sign of tremor.   LABORATORY RESULTS: See E-Chart for most recent results   RADIOLOGY RESULTS: See E-Chart or I-Site for most recent results   IMPRESSION: #1-hypercalcemia, rule out primary hyperparathyroidism  #2-history of renal transplantation  #3-diastolic heart failure   PLAN: I discussed the above findings with the patient and her daughter. I would like to continue her workup for possible primary hyperparathyroidism. We will obtain a 24-hour urine collection for calcium and we will schedule her for a nuclear medicine parathyroid scan. Patient will return to see me once these results are available to discuss them and to make plans for possible surgical intervention.

## 2010-09-09 ENCOUNTER — Other Ambulatory Visit (INDEPENDENT_AMBULATORY_CARE_PROVIDER_SITE_OTHER): Payer: Self-pay | Admitting: Surgery

## 2010-09-14 ENCOUNTER — Encounter (HOSPITAL_COMMUNITY)
Admission: RE | Admit: 2010-09-14 | Discharge: 2010-09-14 | Disposition: A | Discharge status: Home / Self Care | Payer: Medicare Other | Source: Ambulatory Visit | Attending: Surgery | Admitting: Surgery

## 2010-09-14 ENCOUNTER — Ambulatory Visit (HOSPITAL_COMMUNITY)
Admission: RE | Admit: 2010-09-14 | Discharge: 2010-09-14 | Disposition: A | Discharge status: Home / Self Care | Payer: Medicare Other | Source: Ambulatory Visit | Attending: Surgery | Admitting: Surgery

## 2010-09-14 DIAGNOSIS — R946 Abnormal results of thyroid function studies: Secondary | ICD-10-CM | POA: Insufficient documentation

## 2010-09-14 MED ORDER — TECHNETIUM TC 99M SESTAMIBI - CARDIOLITE
20.0000 | Freq: Once | INTRAVENOUS | Status: AC | PRN
  Administered 2010-09-14: 26.2 via INTRAVENOUS

## 2010-10-04 ENCOUNTER — Encounter (INDEPENDENT_AMBULATORY_CARE_PROVIDER_SITE_OTHER): Payer: Self-pay | Admitting: Surgery

## 2010-10-04 ENCOUNTER — Ambulatory Visit (INDEPENDENT_AMBULATORY_CARE_PROVIDER_SITE_OTHER): Payer: Medicare Other | Admitting: Surgery

## 2010-10-04 NOTE — Progress Notes (Signed)
Visit Diagnoses: 1. Hypercalcemia, rule out primary hyperparathyroidism     HISTORY: Patient returns for followup having undergone a nuclear medicine parathyroid scan. This demonstrated persistent increased uptake in the left inferior position consistent with parathyroid adenoma.   PERTINENT REVIEW OF SYSTEMS: No change from last visit.   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   PLAN: The patient and I discussed the results of her nuclear medicine parathyroid scan. I provided her with a written copy of the report this year with her family.  It appears that she has a left inferior parathyroid adenoma. I think this would be amenable to minimally invasive surgery as an outpatient. We discussed the procedure, the risk and benefits, and the postoperative recovery. I would like to obtain preoperative cardiac clearance from [facility].  The risks and benefits of the procedure have been discussed at length with the patient.  The patient understands the proposed procedure, potential alternative treatments, and the course of recovery to be expected.  All of the patient's questions have been answered at this time.  The patient wishes to proceed with surgery and will schedule a date for their procedure through our office staff.   Velora Heckler, MD, FACS General & Endocrine Surgery [facility]

## 2010-10-27 ENCOUNTER — Encounter (HOSPITAL_COMMUNITY): Payer: Medicare Other

## 2010-10-27 ENCOUNTER — Other Ambulatory Visit (INDEPENDENT_AMBULATORY_CARE_PROVIDER_SITE_OTHER): Payer: Self-pay | Admitting: Surgery

## 2010-10-27 ENCOUNTER — Ambulatory Visit (HOSPITAL_COMMUNITY)
Admission: RE | Admit: 2010-10-27 | Discharge: 2010-10-27 | Disposition: A | Discharge status: Home / Self Care | Payer: Medicare Other | Source: Ambulatory Visit | Attending: Surgery | Admitting: Surgery

## 2010-10-27 DIAGNOSIS — I517 Cardiomegaly: Secondary | ICD-10-CM | POA: Insufficient documentation

## 2010-10-27 DIAGNOSIS — Z79899 Other long term (current) drug therapy: Secondary | ICD-10-CM | POA: Insufficient documentation

## 2010-10-27 DIAGNOSIS — R0602 Shortness of breath: Secondary | ICD-10-CM | POA: Insufficient documentation

## 2010-10-27 DIAGNOSIS — I700 Atherosclerosis of aorta: Secondary | ICD-10-CM | POA: Insufficient documentation

## 2010-10-27 DIAGNOSIS — Z01818 Encounter for other preprocedural examination: Secondary | ICD-10-CM

## 2010-10-27 DIAGNOSIS — Z01812 Encounter for preprocedural laboratory examination: Secondary | ICD-10-CM | POA: Insufficient documentation

## 2010-10-27 DIAGNOSIS — I10 Essential (primary) hypertension: Secondary | ICD-10-CM | POA: Insufficient documentation

## 2010-10-27 DIAGNOSIS — M899 Disorder of bone, unspecified: Secondary | ICD-10-CM | POA: Insufficient documentation

## 2010-10-27 DIAGNOSIS — E210 Primary hyperparathyroidism: Secondary | ICD-10-CM | POA: Insufficient documentation

## 2010-10-27 LAB — URINALYSIS, ROUTINE W REFLEX MICROSCOPIC
Bilirubin Urine: NEGATIVE
Ketones, ur: NEGATIVE mg/dL
Nitrite: NEGATIVE
Specific Gravity, Urine: 1.008 (ref 1.005–1.030)
Urobilinogen, UA: 0.2 mg/dL (ref 0.0–1.0)

## 2010-10-27 LAB — DIFFERENTIAL
Basophils Relative: 1 % (ref 0–1)
Eosinophils Absolute: 0.3 10*3/uL (ref 0.0–0.7)
Lymphs Abs: 3 10*3/uL (ref 0.7–4.0)
Monocytes Absolute: 1.1 10*3/uL — ABNORMAL HIGH (ref 0.1–1.0)
Neutro Abs: 6 10*3/uL (ref 1.7–7.7)

## 2010-10-27 LAB — BASIC METABOLIC PANEL
BUN: 13 mg/dL (ref 6–23)
Calcium: 10.9 mg/dL — ABNORMAL HIGH (ref 8.4–10.5)
Creatinine, Ser: 0.73 mg/dL (ref 0.50–1.10)
GFR calc Af Amer: >60 mL/min (ref >60)
GFR calc non Af Amer: >60 mL/min (ref >60)
Potassium: 4 mEq/L (ref 3.5–5.1)

## 2010-10-27 LAB — CBC
HCT: 36.2 % (ref 36.0–46.0)
Hemoglobin: 11.7 g/dL — ABNORMAL LOW (ref 12.0–15.0)
MCH: 25.8 pg — ABNORMAL LOW (ref 26.0–34.0)
MCHC: 32.3 g/dL (ref 30.0–36.0)
MCV: 79.7 fL (ref 78.0–100.0)
Platelets: 349 10*3/uL (ref 150–400)
RBC: 4.54 MIL/uL (ref 3.87–5.11)
RDW: 17.7 % — ABNORMAL HIGH (ref 11.5–15.5)
WBC: 10.5 10*3/uL (ref 4.0–10.5)

## 2010-10-27 LAB — SURGICAL PCR SCREEN: Staphylococcus aureus: NEGATIVE

## 2010-10-27 NOTE — Progress Notes (Signed)
Quick Note:  These results are acceptable for scheduled surgery. TMG ______ 

## 2010-11-04 ENCOUNTER — Other Ambulatory Visit (INDEPENDENT_AMBULATORY_CARE_PROVIDER_SITE_OTHER): Payer: Self-pay | Admitting: Surgery

## 2010-11-04 ENCOUNTER — Ambulatory Visit (HOSPITAL_COMMUNITY)
Admission: RE | Admit: 2010-11-04 | Discharge: 2010-11-04 | Disposition: A | Discharge status: Home / Self Care | Payer: Medicare Other | Source: Ambulatory Visit | Attending: Surgery | Admitting: Surgery

## 2010-11-04 DIAGNOSIS — E210 Primary hyperparathyroidism: Secondary | ICD-10-CM

## 2010-11-04 DIAGNOSIS — Z01812 Encounter for preprocedural laboratory examination: Secondary | ICD-10-CM | POA: Insufficient documentation

## 2010-11-04 DIAGNOSIS — Z01818 Encounter for other preprocedural examination: Secondary | ICD-10-CM | POA: Insufficient documentation

## 2010-11-04 DIAGNOSIS — Z8673 Personal history of transient ischemic attack (TIA), and cerebral infarction without residual deficits: Secondary | ICD-10-CM | POA: Insufficient documentation

## 2010-11-04 DIAGNOSIS — I10 Essential (primary) hypertension: Secondary | ICD-10-CM | POA: Insufficient documentation

## 2010-11-04 DIAGNOSIS — D351 Benign neoplasm of parathyroid gland: Secondary | ICD-10-CM

## 2010-11-04 DIAGNOSIS — Z940 Kidney transplant status: Secondary | ICD-10-CM | POA: Insufficient documentation

## 2010-11-04 DIAGNOSIS — I519 Heart disease, unspecified: Secondary | ICD-10-CM | POA: Insufficient documentation

## 2010-11-04 HISTORY — PX: PARATHYROIDECTOMY: SHX19

## 2010-11-22 ENCOUNTER — Encounter (INDEPENDENT_AMBULATORY_CARE_PROVIDER_SITE_OTHER): Payer: Self-pay

## 2010-11-22 NOTE — Op Note (Signed)
  NAMETIANE, SZYDLOWSKI                 ACCOUNT NO.:  [id number]  MEDICAL RECORD NO.:  [id number]  LOCATION:  DAYL                         FACILITY:  [facility]  PHYSICIAN:  Velora Heckler, MD      DATE OF BIRTH:  August 12, 1945  DATE OF PROCEDURE:  11/04/2010                               OPERATIVE REPORT   PREOPERATIVE DIAGNOSIS:  Primary hyperparathyroidism.  POSTOPERATIVE DIAGNOSIS:  Primary hyperparathyroidism.  PROCEDURE:  Left inferior minimally invasive parathyroidectomy.  SURGEON:  Velora Heckler, M.D., FACS  ANESTHESIA:  General.  ESTIMATED BLOOD LOSS:  Minimal.  PREPARATION:  ChloraPrep.  COMPLICATIONS:  None.  INDICATIONS:  The patient is a 65 year old white female followed with elevated serum calcium levels for several years.  Most recent levels showed a calcium of 10.6 and an elevated intact PTH level of 144.  These were not felt to be related to renal disease as the patient had undergone living related renal transplantation in 1983.  Workup included a nuclear medicine parathyroid scan, which localized evidence of an abnormal parathyroid gland to the left inferior position.  The patient now comes to surgery for parathyroidectomy.  BODY OF REPORT:  Procedure was done in OR #6 at the [facility].  The patient was brought to the operating room, placed in the supine position on the operating room table.  Following administration of general anesthesia, the patient was positioned and then prepped and draped in the usual strict aseptic fashion.  After ascertaining that an adequate level of anesthesia had been achieved, a left neck incision was made with a #15 blade.  Dissection was carried through subcutaneous tissues and platysma and hemostasis obtained with electrocautery.  Skin flaps were elevated circumferentially and a Weitlaner retractor placed for exposure.  Strap muscles were incised in the midline and reflected to the left exposing the inferior pole  of the left thyroid lobe.  Exploration in the tracheoesophageal groove as indicated on the nuclear medicine parathyroid scan reveals an abnormally enlarged parathyroid gland surrounded by adipose tissue.  This was gently dissected out using the electrocautery for hemostasis.  Vascular structures were preserved.  Gland was completely excised.  It was sectioned on the operating room table and shows a slightly larger than 1 cm gland consistent with adenoma.  It was submitted to Pathology where frozen section confirmed parathyroid tissue consistent with parathyroid adenoma.  Good hemostasis was achieved.  Surgicel was placed in the operative field.  Strap muscles were reapproximated in the midline with interrupted 3-0 Vicryl sutures.  Platysma was closed with interrupted 3- 0 Vicryl sutures.  Incision was anesthetized with local anesthetic.  Skin incision was closed with running 4-0 Monocryl subcuticular incision.  Wound was washed and dried and benzoin and Steri-Strips were applied.  Sterile dressings were applied.  The patient was awakened from Anesthesia and brought to the recovery room.  The patient tolerated the procedure well.   Velora Heckler, MD, FACS     TMG/MEDQ  D:  11/04/2010  T:  11/05/2010  Job:  [id number]  cc:   Elby Showers, MD Fax: [phone]  Electronically Signed by Darnell Level MD on 11/22/2010 10:41:41 AM

## 2010-11-23 ENCOUNTER — Other Ambulatory Visit (INDEPENDENT_AMBULATORY_CARE_PROVIDER_SITE_OTHER): Payer: Self-pay | Admitting: Surgery

## 2010-11-23 LAB — CALCIUM: Calcium: 10.1 mg/dL (ref 8.4–10.5)

## 2010-11-24 ENCOUNTER — Encounter (INDEPENDENT_AMBULATORY_CARE_PROVIDER_SITE_OTHER): Payer: Self-pay | Admitting: Surgery

## 2010-11-24 ENCOUNTER — Ambulatory Visit (INDEPENDENT_AMBULATORY_CARE_PROVIDER_SITE_OTHER): Payer: Medicare Other | Admitting: Surgery

## 2010-11-24 NOTE — Progress Notes (Signed)
Visit Diagnoses: 1. Hypercalcemia, rule out primary hyperparathyroidism     HISTORY: Patient returns for her first postoperative visit having undergone minimally invasive parathyroidectomy on November 04, 2010. Final pathology shows a 500 mg specimen consistent with parathyroid adenoma. Followup serum calcium level drawn yesterday is normal at 10.1.   EXAM: Surgical wound has healed nicely with good cosmetic result. No sign of infection. Voice is normal.   IMPRESSION: Status post minimally invasive parathyroidectomy with normalization of serum calcium level   PLAN: Patient will begin applying topical creams to her incision. She will return to see me for a wound check in 6 weeks. We will check an intact PTH level and serum calcium level prior to that office visit.   Velora Heckler, MD, FACS General & Endocrine Surgery [facility]

## 2010-11-24 NOTE — Patient Instructions (Signed)
  COCOA BUTTER & VITAMIN E CREAM  ([employer] or other brand)  Apply cocoa butter/vitamin E cream to your incision 2 - 3 times daily.  Massage cream into incision for one minute with each application.  Use sunscreen (50 SPF or higher) for first 6 months after surgery.  You may substitute Mederma or other scar reducing creams as desired.   

## 2010-12-01 ENCOUNTER — Other Ambulatory Visit: Payer: Self-pay | Admitting: Internal Medicine

## 2010-12-01 DIAGNOSIS — R112 Nausea with vomiting, unspecified: Secondary | ICD-10-CM

## 2010-12-02 ENCOUNTER — Ambulatory Visit
Admission: RE | Admit: 2010-12-02 | Discharge: 2010-12-02 | Disposition: A | Discharge status: Home / Self Care | Payer: Medicare Other | Source: Ambulatory Visit | Attending: Internal Medicine | Admitting: Internal Medicine

## 2010-12-02 DIAGNOSIS — R112 Nausea with vomiting, unspecified: Secondary | ICD-10-CM

## 2010-12-02 MED ORDER — IOHEXOL 300 MG/ML  SOLN
50.0000 mL | Freq: Once | INTRAMUSCULAR | Status: AC | PRN
  Administered 2010-12-02: 50 mL via INTRAVENOUS

## 2010-12-27 ENCOUNTER — Other Ambulatory Visit (INDEPENDENT_AMBULATORY_CARE_PROVIDER_SITE_OTHER): Payer: Self-pay | Admitting: Surgery

## 2010-12-28 LAB — PTH, INTACT AND CALCIUM
Calcium, Total (PTH): 9.9 mg/dL (ref 8.4–10.5)
PTH: 80.8 pg/mL — ABNORMAL HIGH (ref 14.0–72.0)

## 2011-01-04 ENCOUNTER — Ambulatory Visit (INDEPENDENT_AMBULATORY_CARE_PROVIDER_SITE_OTHER): Payer: Medicare Other | Admitting: Surgery

## 2011-01-04 ENCOUNTER — Encounter (INDEPENDENT_AMBULATORY_CARE_PROVIDER_SITE_OTHER): Payer: Self-pay | Admitting: Surgery

## 2011-01-04 DIAGNOSIS — E210 Primary hyperparathyroidism: Secondary | ICD-10-CM | POA: Insufficient documentation

## 2011-01-04 NOTE — Patient Instructions (Signed)
  COCOA BUTTER & VITAMIN E CREAM  ([employer] or other brand)  Apply cocoa butter/vitamin E cream to your incision 2 - 3 times daily.  Massage cream into incision for one minute with each application.  Use sunscreen (50 SPF or higher) for first 6 months after surgery.  You may substitute Mederma or other scar reducing creams as desired.   

## 2011-01-04 NOTE — Progress Notes (Signed)
Visit Diagnoses: 1. Hypercalcemia, rule out primary hyperparathyroidism   2. Hyperparathyroidism, primary     HISTORY: Patient returns for her second postoperative visit having undergone minimally invasive parathyroidectomy. Laboratory studies in early November 2012 show a normal serum calcium level of 9.9 and a slightly elevated PTH level of 80.8, down from her preoperative level of 144.  EXAM: Surgical wound has healed nicely with an excellent cosmetic result. Voice quality is normal. No sign of seroma. No sign of infection.  IMPRESSION: Status post minimally invasive parathyroidectomy with normalization of serum calcium level.  PLAN: Patient and I reviewed the laboratory results. I would like to repeat her PTH level and calcium level in 6 months. Hopefully both levels we'll normalize completely. Patient will return for followup as needed.   Velora Heckler, MD, FACS General & Endocrine Surgery [facility]

## 2011-02-23 DIAGNOSIS — R350 Frequency of micturition: Secondary | ICD-10-CM | POA: Diagnosis not present

## 2011-02-23 DIAGNOSIS — N952 Postmenopausal atrophic vaginitis: Secondary | ICD-10-CM | POA: Diagnosis not present

## 2011-02-23 DIAGNOSIS — R339 Retention of urine, unspecified: Secondary | ICD-10-CM | POA: Diagnosis not present

## 2011-02-23 DIAGNOSIS — R82998 Other abnormal findings in urine: Secondary | ICD-10-CM | POA: Diagnosis not present

## 2011-03-02 DIAGNOSIS — N390 Urinary tract infection, site not specified: Secondary | ICD-10-CM | POA: Diagnosis not present

## 2011-03-02 DIAGNOSIS — R339 Retention of urine, unspecified: Secondary | ICD-10-CM | POA: Diagnosis not present

## 2011-03-02 DIAGNOSIS — R82998 Other abnormal findings in urine: Secondary | ICD-10-CM | POA: Diagnosis not present

## 2011-03-02 DIAGNOSIS — R350 Frequency of micturition: Secondary | ICD-10-CM | POA: Diagnosis not present

## 2011-03-08 DIAGNOSIS — N390 Urinary tract infection, site not specified: Secondary | ICD-10-CM | POA: Diagnosis not present

## 2011-03-08 DIAGNOSIS — R350 Frequency of micturition: Secondary | ICD-10-CM | POA: Diagnosis not present

## 2011-03-15 DIAGNOSIS — F330 Major depressive disorder, recurrent, mild: Secondary | ICD-10-CM | POA: Diagnosis not present

## 2011-03-15 DIAGNOSIS — F4001 Agoraphobia with panic disorder: Secondary | ICD-10-CM | POA: Diagnosis not present

## 2011-03-15 DIAGNOSIS — F410 Panic disorder [episodic paroxysmal anxiety] without agoraphobia: Secondary | ICD-10-CM | POA: Diagnosis not present

## 2011-03-21 DIAGNOSIS — M545 Low back pain: Secondary | ICD-10-CM | POA: Diagnosis not present

## 2011-03-29 ENCOUNTER — Telehealth (INDEPENDENT_AMBULATORY_CARE_PROVIDER_SITE_OTHER): Payer: Self-pay

## 2011-03-29 NOTE — Telephone Encounter (Signed)
Pt called stating she was having aches all over her body,nausea,tingling in toes and hands. Pt is on numerous meds. Pt states this has been going on for some time. Pt cannot give me a more exact date these symptoms started. Pt advised I will send this msg to Dr Gerrit Friends to see if he wants any labs drawn but she needs to see Dr Clent Ridges this week to be evaluated and be sure there is not other reason for these symptoms. Pt states she has not sob,chest pain,irregular heart rate or vision changes. Pt advised if these symptoms occur she is to go to ER asap.

## 2011-03-30 DIAGNOSIS — R634 Abnormal weight loss: Secondary | ICD-10-CM | POA: Diagnosis not present

## 2011-03-30 DIAGNOSIS — R5381 Other malaise: Secondary | ICD-10-CM | POA: Diagnosis not present

## 2011-03-30 NOTE — Telephone Encounter (Signed)
Should see primary MD.  Likely not related to parathyroid disease.  No labs at present. tmg

## 2011-04-05 DIAGNOSIS — M25519 Pain in unspecified shoulder: Secondary | ICD-10-CM | POA: Diagnosis not present

## 2011-05-10 DIAGNOSIS — F410 Panic disorder [episodic paroxysmal anxiety] without agoraphobia: Secondary | ICD-10-CM | POA: Diagnosis not present

## 2011-05-10 DIAGNOSIS — F330 Major depressive disorder, recurrent, mild: Secondary | ICD-10-CM | POA: Diagnosis not present

## 2011-05-30 DIAGNOSIS — J209 Acute bronchitis, unspecified: Secondary | ICD-10-CM | POA: Diagnosis not present

## 2011-06-07 DIAGNOSIS — M25519 Pain in unspecified shoulder: Secondary | ICD-10-CM | POA: Diagnosis not present

## 2011-06-12 DIAGNOSIS — M25519 Pain in unspecified shoulder: Secondary | ICD-10-CM | POA: Diagnosis not present

## 2011-06-27 DIAGNOSIS — I5032 Chronic diastolic (congestive) heart failure: Secondary | ICD-10-CM | POA: Diagnosis not present

## 2011-06-27 DIAGNOSIS — E785 Hyperlipidemia, unspecified: Secondary | ICD-10-CM | POA: Diagnosis not present

## 2011-06-27 DIAGNOSIS — R0602 Shortness of breath: Secondary | ICD-10-CM | POA: Diagnosis not present

## 2011-06-28 DIAGNOSIS — N390 Urinary tract infection, site not specified: Secondary | ICD-10-CM | POA: Diagnosis not present

## 2011-07-08 ENCOUNTER — Encounter (HOSPITAL_COMMUNITY): Payer: Self-pay

## 2011-07-08 ENCOUNTER — Emergency Department (HOSPITAL_COMMUNITY)
Admission: EM | Admit: 2011-07-08 | Discharge: 2011-07-08 | Disposition: A | Discharge status: Home / Self Care | Payer: Medicare Other | Attending: Emergency Medicine | Admitting: Emergency Medicine

## 2011-07-08 ENCOUNTER — Emergency Department (HOSPITAL_COMMUNITY): Payer: Medicare Other

## 2011-07-08 DIAGNOSIS — Z79899 Other long term (current) drug therapy: Secondary | ICD-10-CM | POA: Insufficient documentation

## 2011-07-08 DIAGNOSIS — R0602 Shortness of breath: Secondary | ICD-10-CM | POA: Diagnosis not present

## 2011-07-08 DIAGNOSIS — Z87891 Personal history of nicotine dependence: Secondary | ICD-10-CM | POA: Insufficient documentation

## 2011-07-08 DIAGNOSIS — R6889 Other general symptoms and signs: Secondary | ICD-10-CM | POA: Diagnosis not present

## 2011-07-08 DIAGNOSIS — R112 Nausea with vomiting, unspecified: Secondary | ICD-10-CM | POA: Diagnosis not present

## 2011-07-08 DIAGNOSIS — I517 Cardiomegaly: Secondary | ICD-10-CM | POA: Diagnosis not present

## 2011-07-08 DIAGNOSIS — R079 Chest pain, unspecified: Secondary | ICD-10-CM | POA: Diagnosis not present

## 2011-07-08 DIAGNOSIS — E785 Hyperlipidemia, unspecified: Secondary | ICD-10-CM | POA: Insufficient documentation

## 2011-07-08 DIAGNOSIS — R0789 Other chest pain: Secondary | ICD-10-CM | POA: Diagnosis not present

## 2011-07-08 DIAGNOSIS — Z940 Kidney transplant status: Secondary | ICD-10-CM | POA: Insufficient documentation

## 2011-07-08 DIAGNOSIS — R42 Dizziness and giddiness: Secondary | ICD-10-CM | POA: Diagnosis not present

## 2011-07-08 DIAGNOSIS — Z8673 Personal history of transient ischemic attack (TIA), and cerebral infarction without residual deficits: Secondary | ICD-10-CM | POA: Diagnosis not present

## 2011-07-08 DIAGNOSIS — J438 Other emphysema: Secondary | ICD-10-CM | POA: Diagnosis not present

## 2011-07-08 DIAGNOSIS — I10 Essential (primary) hypertension: Secondary | ICD-10-CM | POA: Diagnosis not present

## 2011-07-08 LAB — URINALYSIS, ROUTINE W REFLEX MICROSCOPIC
Bilirubin Urine: NEGATIVE
Glucose, UA: NEGATIVE mg/dL
Hgb urine dipstick: NEGATIVE
Ketones, ur: NEGATIVE mg/dL
Nitrite: NEGATIVE
Specific Gravity, Urine: 1.003 — ABNORMAL LOW (ref 1.005–1.030)
pH: 6.5 (ref 5.0–8.0)

## 2011-07-08 LAB — CBC
HCT: 36.5 % (ref 36.0–46.0)
Hemoglobin: 11.8 g/dL — ABNORMAL LOW (ref 12.0–15.0)
MCH: 26.9 pg (ref 26.0–34.0)
MCHC: 32.3 g/dL (ref 30.0–36.0)
RDW: 16.2 % — ABNORMAL HIGH (ref 11.5–15.5)

## 2011-07-08 LAB — COMPREHENSIVE METABOLIC PANEL
AST: 17 U/L (ref 0–37)
Albumin: 4 g/dL (ref 3.5–5.2)
Alkaline Phosphatase: 56 U/L (ref 39–117)
CO2: 23 mEq/L (ref 19–32)
Chloride: 103 mEq/L (ref 96–112)
Creatinine, Ser: 0.73 mg/dL (ref 0.50–1.10)
GFR calc non Af Amer: 87 mL/min — ABNORMAL LOW (ref >90)
Potassium: 4 mEq/L (ref 3.5–5.1)
Total Bilirubin: 0.3 mg/dL (ref 0.3–1.2)

## 2011-07-08 LAB — TROPONIN I: Troponin I: <0.3 ng/mL (ref <0.30)

## 2011-07-08 MED ORDER — ASPIRIN 325 MG PO TABS
325.0000 mg | ORAL_TABLET | Freq: Once | ORAL | Status: AC
  Administered 2011-07-08: 325 mg via ORAL
  Filled 2011-07-08: qty 1

## 2011-07-08 MED ORDER — ACETAMINOPHEN 325 MG PO TABS
650.0000 mg | ORAL_TABLET | Freq: Once | ORAL | Status: AC
  Administered 2011-07-08: 650 mg via ORAL
  Filled 2011-07-08: qty 2

## 2011-07-08 NOTE — ED Provider Notes (Signed)
I saw and evaluated the patient, reviewed the resident's note and I agree with the findings and plan.   Loren Racer, MD 07/08/11 [id number]

## 2011-07-08 NOTE — ED Notes (Addendum)
Pt went to video store had sudden onset of nausea.  Pt took Tums and subsequently vomited.  All symptoms subsided.  Pt reported some SOB, but currently denies.  BP 180/100.

## 2011-07-08 NOTE — Discharge Instructions (Signed)
Chest Pain (Nonspecific) It is often hard to give a specific diagnosis for the cause of chest pain. There is always a chance that your pain could be related to something serious, such as a heart attack or a blood clot in the lungs. You need to follow up with your caregiver for further evaluation. CAUSES   Heartburn.   Pneumonia or bronchitis.   Anxiety or stress.   Inflammation around your heart (pericarditis) or lung (pleuritis or pleurisy).   A blood clot in the lung.   A collapsed lung (pneumothorax). It can develop suddenly on its own (spontaneous pneumothorax) or from injury (trauma) to the chest.   Shingles infection (herpes zoster virus).  The chest wall is composed of bones, muscles, and cartilage. Any of these can be the source of the pain.  The bones can be bruised by injury.   The muscles or cartilage can be strained by coughing or overwork.   The cartilage can be affected by inflammation and become sore (costochondritis).  DIAGNOSIS  Lab tests or other studies, such as X-rays, electrocardiography, stress testing, or cardiac imaging, may be needed to find the cause of your pain.  TREATMENT   Treatment depends on what may be causing your chest pain. Treatment may include:   Acid blockers for heartburn.   Anti-inflammatory medicine.   Pain medicine for inflammatory conditions.   Antibiotics if an infection is present.   You may be advised to change lifestyle habits. This includes stopping smoking and avoiding alcohol, caffeine, and chocolate.   You may be advised to keep your head raised (elevated) when sleeping. This reduces the chance of acid going backward from your stomach into your esophagus.   Most of the time, nonspecific chest pain will improve within 2 to 3 days with rest and mild pain medicine.  HOME CARE INSTRUCTIONS   If antibiotics were prescribed, take your antibiotics as directed. Finish them even if you start to feel better.   For the next few  days, avoid physical activities that bring on chest pain. Continue physical activities as directed.   Do not smoke.   Avoid drinking alcohol.   Only take over-the-counter or prescription medicine for pain, discomfort, or fever as directed by your caregiver.   Follow your caregiver's suggestions for further testing if your chest pain does not go away.   Keep any follow-up appointments you made. If you do not go to an appointment, you could develop lasting (chronic) problems with pain. If there is any problem keeping an appointment, you must call to reschedule.  SEEK MEDICAL CARE IF:   You think you are having problems from the medicine you are taking. Read your medicine instructions carefully.   Your chest pain does not go away, even after treatment.   You develop a rash with blisters on your chest.  SEEK IMMEDIATE MEDICAL CARE IF:   You have increased chest pain or pain that spreads to your arm, neck, jaw, back, or abdomen.   You develop shortness of breath, an increasing cough, or you are coughing up blood.   You have severe back or abdominal pain, feel nauseous, or vomit.   You develop severe weakness, fainting, or chills.   You have a fever.  THIS IS AN EMERGENCY. Do not wait to see if the pain will go away. Get medical help at once. Call your local emergency services (911 in [place] Do not drive yourself to the hospital. MAKE SURE YOU:   Understand these instructions.     Will watch your condition.   Will get help right away if you are not doing well or get worse.  Document Released: 11/16/2004 Document Revised: 01/26/2011 Document Reviewed: 09/12/2007 [facility] Patient Information 2012 [place], [place].Nausea and Vomiting Nausea is a sick feeling that often comes before throwing up (vomiting). Vomiting is a reflex where stomach contents come out of your mouth. Vomiting can cause severe loss of body fluids (dehydration). Children and elderly adults can become dehydrated quickly,  especially if they also have diarrhea. Nausea and vomiting are symptoms of a condition or disease. It is important to find the cause of your symptoms. CAUSES   Direct irritation of the stomach lining. This irritation can result from increased acid production (gastroesophageal reflux disease), infection, food poisoning, taking certain medicines (such as nonsteroidal anti-inflammatory drugs), alcohol use, or tobacco use.   Signals from the brain.These signals could be caused by a headache, heat exposure, an inner ear disturbance, increased pressure in the brain from injury, infection, a tumor, or a concussion, pain, emotional stimulus, or metabolic problems.   An obstruction in the gastrointestinal tract (bowel obstruction).   Illnesses such as diabetes, hepatitis, gallbladder problems, appendicitis, kidney problems, cancer, sepsis, atypical symptoms of a heart attack, or eating disorders.   Medical treatments such as chemotherapy and radiation.   Receiving medicine that makes you sleep (general anesthetic) during surgery.  DIAGNOSIS Your caregiver may ask for tests to be done if the problems do not improve after a few days. Tests may also be done if symptoms are severe or if the reason for the nausea and vomiting is not clear. Tests may include:  Urine tests.   Blood tests.   Stool tests.   Cultures (to look for evidence of infection).   X-rays or other imaging studies.  Test results can help your caregiver make decisions about treatment or the need for additional tests. TREATMENT You need to stay well hydrated. Drink frequently but in small amounts.You may wish to drink water, sports drinks, clear broth, or eat frozen ice pops or gelatin dessert to help stay hydrated.When you eat, eating slowly may help prevent nausea.There are also some antinausea medicines that may help prevent nausea. HOME CARE INSTRUCTIONS   Take all medicine as directed by your caregiver.   If you do not  have an appetite, do not force yourself to eat. However, you must continue to drink fluids.   If you have an appetite, eat a normal diet unless your caregiver tells you differently.   Eat a variety of complex carbohydrates (rice, wheat, potatoes, bread), lean meats, yogurt, fruits, and vegetables.   Avoid high-fat foods because they are more difficult to digest.   Drink enough water and fluids to keep your urine clear or pale yellow.   If you are dehydrated, ask your caregiver for specific rehydration instructions. Signs of dehydration may include:   Severe thirst.   Dry lips and mouth.   Dizziness.   Dark urine.   Decreasing urine frequency and amount.   Confusion.   Rapid breathing or pulse.  SEEK IMMEDIATE MEDICAL CARE IF:   You have blood or brown flecks (like coffee grounds) in your vomit.   You have black or bloody stools.   You have a severe headache or stiff neck.   You are confused.   You have severe abdominal pain.   You have chest pain or trouble breathing.   You do not urinate at least once every 8 hours.   You develop cold  or clammy skin.   You continue to vomit for longer than 24 to 48 hours.   You have a fever.  MAKE SURE YOU:   Understand these instructions.   Will watch your condition.   Will get help right away if you are not doing well or get worse.  Document Released: 02/06/2005 Document Revised: 01/26/2011 Document Reviewed: 07/06/2010 [facility] Patient Information 2012 [place], [place].

## 2011-07-08 NOTE — ED Provider Notes (Signed)
History     CSN: [id number]  Arrival date & time 07/08/11  1842   First MD Initiated Contact with Patient 07/08/11 1855      Chief Complaint  Patient presents with  . Nausea    (Consider location/radiation/quality/duration/timing/severity/associated sxs/prior treatment) HPI Comments: Pt was walking out of a video store and developed lightheadedness, nausea, and a feeling of indigestion in her chest.  Indigestion resolved after 1 minute but nausea persisted x 15 mins at then pt vomited.  No abdominal pain and no symptoms since the event.  Pt says she has had a mild headache today similar to prior headaches but is otherwise doing well.  Patient is a 66 y.o. female presenting with vomiting. The history is provided by the patient.  Emesis  This is a new problem. The current episode started less than 1 hour ago. Episode frequency: once. The problem has not changed since onset.There has been no fever. Associated symptoms include headaches (mild today). Pertinent negatives include no abdominal pain, no cough, no diarrhea, no fever and no URI.    Past Medical History  Diagnosis Date  . Renal failure   . Diverticulitis of large intestine with perforation Hx colectomy  . Hypertension   . Barrett's esophagus   . Hyperlipemia   . Esophageal stricture   . CVA (cerebral infarction)     Mini Strokes   . Pancreatic cyst   . Gastroparesis   . Depression   . Heart failure, diastolic, chronic   . Persistent headaches   . Rapid heart rate     some times  . GERD (gastroesophageal reflux disease)   . Forgetfulness   . Irritable   . Sleep difficulties   . Bone pain   . Osteoporosis   . Fatigue   . Disturbed concentration   . Itch     skin  . Nausea     little vomitting  . TIA (transient ischemic attack)   . Macular degeneration   . Keratosis   . Anxiety   . Panic attacks   . Bruises easily   . Chills   . Generalized headaches     Past Surgical History  Procedure Date  . Kidney  transplant 1984  . Appendectomy   . Colon resection 2010    Colostomy  . Colostomy 2011    Reversal   . Colostomy reversal   . Left arm surgery     due to injury  . Cataract surgery bilateral   . Esophageal dilation   . Parathyroidectomy 11/04/2010    left inferior parathyroid    Family History  Problem Relation Age of Onset  . Colon cancer Neg Hx   . Lung cancer Sister   . Cancer Sister     lung  . Heart disease Mother   . Stroke Father   . Aneurysm Brother     History  Substance Use Topics  . Smoking status: Former Smoker    Quit date: 05/30/1989  . Smokeless tobacco: Never Used  . Alcohol Use: No    OB History    Grav Para Term Preterm Abortions TAB SAB Ect Mult Living                  Review of Systems  Constitutional: Negative for fever and activity change.  HENT: Negative for congestion.   Eyes: Negative for visual disturbance.  Respiratory: Negative for cough, chest tightness and shortness of breath.   Cardiovascular: Negative for chest pain and leg swelling.  Gastrointestinal: Positive for vomiting. Negative for abdominal pain and diarrhea.  Genitourinary: Negative for dysuria.  Skin: Negative for rash.  Neurological: Positive for headaches (mild today). Negative for syncope.  Psychiatric/Behavioral: Negative for behavioral problems.    Allergies  Review of patient's allergies indicates no known allergies.  Home Medications   Current Outpatient Rx  Name Route Sig Dispense Refill  . AMPICILLIN 500 MG PO CAPS Oral Take 500 mg by mouth 4 (four) times daily.     . ATORVASTATIN CALCIUM 20 MG PO TABS Oral Take 20 mg by mouth daily.     . AZATHIOPRINE 50 MG PO TABS Oral Take 50 mg by mouth daily.      . CYCLOSPORINE 0.05 % OP EMUL Both Eyes Place 1 drop into both eyes 2 (two) times daily.      Marland Kitchen DIAZEPAM 2 MG PO TABS Oral Take 2 mg by mouth daily.    Marland Kitchen FLUOXETINE HCL 40 MG PO CAPS Oral Take 40 mg by mouth daily.      . FUROSEMIDE 40 MG PO TABS Oral  Take 40 mg by mouth 2 (two) times daily.     Marland Kitchen HYDRALAZINE HCL 25 MG PO TABS Oral Take 25 mg by mouth daily.      Marland Kitchen HYDROCODONE-ACETAMINOPHEN 10-325 MG PO TABS Oral Take 1 tablet by mouth every 6 (six) hours as needed. For pain    . METOPROLOL TARTRATE 50 MG PO TABS Oral Take 50 mg by mouth 2 (two) times daily. 1/2 two times daily     . OMEPRAZOLE 20 MG PO CPDR Oral Take 20 mg by mouth daily. Twice daily     . PREDNISONE 5 MG PO TABS Oral Take 5 mg by mouth daily. One and 1/2 daily     . QUETIAPINE FUMARATE 100 MG PO TABS Oral Take 100 mg by mouth at bedtime.    Marland Kitchen QUETIAPINE FUMARATE 50 MG PO TABS Oral Take 50 mg by mouth at bedtime. Takes with 100 mg tablet    . VERAPAMIL HCL 120 MG PO CP24 Oral Take 120 mg by mouth.      . GLYCOPYRROLATE 2 MG PO TABS Oral Take 1 tablet (2 mg total) by mouth 3 (three) times daily as needed. 30 tablet 2    BP 134/68  Pulse 62  Temp(Src) 98 F (36.7 C) (Oral)  Resp 16  SpO2 100%  Physical Exam  Constitutional: She is oriented to person, place, and time. She appears well-developed and well-nourished.  HENT:  Head: Normocephalic and atraumatic.  Eyes: Conjunctivae and EOM are normal. Pupils are equal, round, and reactive to light. No scleral icterus.  Neck: Normal range of motion. Neck supple.  Cardiovascular: Normal rate and regular rhythm.  Exam reveals no gallop and no friction rub.   No murmur heard. Pulmonary/Chest: Effort normal and breath sounds normal. No respiratory distress. She has no wheezes. She has no rales. She exhibits no tenderness.  Abdominal: Soft. She exhibits no distension and no mass. There is no tenderness. There is no rebound and no guarding.  Musculoskeletal: Normal range of motion.  Neurological: She is alert and oriented to person, place, and time. She has normal reflexes. No cranial nerve deficit.  Skin: Skin is warm and dry. No rash noted.  Psychiatric: She has a normal mood and affect. Her behavior is normal. Judgment and  thought content normal.    ED Course  Procedures (including critical care time)  EKG: NSR, no ST/T wave abnormality.  Labs Reviewed  CBC - Abnormal; Notable for the following:    Hemoglobin 11.8 (*)    RDW 16.2 (*)    All other components within normal limits  COMPREHENSIVE METABOLIC PANEL - Abnormal; Notable for the following:    Glucose, Bld 116 (*)    GFR calc non Af Amer 87 (*)    All other components within normal limits  URINALYSIS, ROUTINE W REFLEX MICROSCOPIC - Abnormal; Notable for the following:    Color, Urine STRAW (*)    Specific Gravity, Urine 1.003 (*)    All other components within normal limits  TROPONIN I  TROPONIN I   Dg Chest 2 View  07/08/2011  *RADIOLOGY REPORT*  Clinical Data: Shortness of breath.  CHEST - 2 VIEW  Comparison: 10/27/2010  Findings: The cardiopericardial silhouette is enlarged. Hyperexpansion is consistent with emphysema.  Interstitial markings are diffusely coarsened with chronic features. Bones are diffusely demineralized.  IMPRESSION: Stable.  Cardiomegaly with emphysema.  No acute cardiopulmonary findings.  Original Report Authenticated By: ERIC A. MANSELL, M.D.     1. Nausea and vomiting   2. Chest pain   3. Lightheadedness       MDM  Pt was walking out of a video store and developed lightheadedness, nausea, and a feeling of indigestion in her chest.  Indigestion resolved after 1 minute but nausea persisted x 15 mins at then pt vomited.  No abdominal pain and no symptoms since the event.  Pt says she has had a mild headache today similar to prior headaches but is otherwise doing well.  VSS and well appearing.  Asymptomatic in ED.  EKG and delta troponin negative.  Symptoms most likely vasovagal in nature 2/2 nausea.  Doubt ACS, PE.  Pt remains asymptomatic in ED during several hours of observation.  Given strict return precautions.  PCP f/u.  Pt comfortable with plan and will follow up.         Army Chaco, MD 07/08/11  2241

## 2011-07-10 DIAGNOSIS — I10 Essential (primary) hypertension: Secondary | ICD-10-CM | POA: Diagnosis not present

## 2011-07-10 DIAGNOSIS — Z940 Kidney transplant status: Secondary | ICD-10-CM | POA: Diagnosis not present

## 2011-07-10 DIAGNOSIS — N2581 Secondary hyperparathyroidism of renal origin: Secondary | ICD-10-CM | POA: Diagnosis not present

## 2011-07-10 DIAGNOSIS — D649 Anemia, unspecified: Secondary | ICD-10-CM | POA: Diagnosis not present

## 2011-07-10 DIAGNOSIS — I120 Hypertensive chronic kidney disease with stage 5 chronic kidney disease or end stage renal disease: Secondary | ICD-10-CM | POA: Diagnosis not present

## 2011-07-10 DIAGNOSIS — N185 Chronic kidney disease, stage 5: Secondary | ICD-10-CM | POA: Diagnosis not present

## 2011-07-11 ENCOUNTER — Other Ambulatory Visit: Payer: Self-pay | Admitting: Internal Medicine

## 2011-07-11 DIAGNOSIS — Z1231 Encounter for screening mammogram for malignant neoplasm of breast: Secondary | ICD-10-CM

## 2011-07-18 DIAGNOSIS — N390 Urinary tract infection, site not specified: Secondary | ICD-10-CM | POA: Diagnosis not present

## 2011-07-18 DIAGNOSIS — N949 Unspecified condition associated with female genital organs and menstrual cycle: Secondary | ICD-10-CM | POA: Diagnosis not present

## 2011-07-21 DIAGNOSIS — R110 Nausea: Secondary | ICD-10-CM | POA: Diagnosis not present

## 2011-07-24 ENCOUNTER — Ambulatory Visit: Payer: Medicare Other

## 2011-07-26 ENCOUNTER — Telehealth: Payer: Self-pay | Admitting: Gastroenterology

## 2011-07-26 NOTE — Telephone Encounter (Signed)
Pt wanted to know when she was due for repeat CT scan. Pt has scan back in 06/2010. Note stated to repeat CT in a year. Pt had another CT scan done in 11/2010 so would not be due at this time. Pt c/o abdominal pain and requests to be seen. Pt scheduled to see Dr. Arlyce Dice 08/18/11@2 :30pm. Pt aware of appt date and time.

## 2011-08-02 DIAGNOSIS — F410 Panic disorder [episodic paroxysmal anxiety] without agoraphobia: Secondary | ICD-10-CM | POA: Diagnosis not present

## 2011-08-02 DIAGNOSIS — F330 Major depressive disorder, recurrent, mild: Secondary | ICD-10-CM | POA: Diagnosis not present

## 2011-08-09 DIAGNOSIS — E785 Hyperlipidemia, unspecified: Secondary | ICD-10-CM | POA: Diagnosis not present

## 2011-08-09 DIAGNOSIS — F329 Major depressive disorder, single episode, unspecified: Secondary | ICD-10-CM | POA: Diagnosis not present

## 2011-08-09 DIAGNOSIS — Z23 Encounter for immunization: Secondary | ICD-10-CM | POA: Diagnosis not present

## 2011-08-09 DIAGNOSIS — I10 Essential (primary) hypertension: Secondary | ICD-10-CM | POA: Diagnosis not present

## 2011-08-09 DIAGNOSIS — M810 Age-related osteoporosis without current pathological fracture: Secondary | ICD-10-CM | POA: Diagnosis not present

## 2011-08-09 DIAGNOSIS — K219 Gastro-esophageal reflux disease without esophagitis: Secondary | ICD-10-CM | POA: Diagnosis not present

## 2011-08-09 DIAGNOSIS — E213 Hyperparathyroidism, unspecified: Secondary | ICD-10-CM | POA: Diagnosis not present

## 2011-08-09 DIAGNOSIS — Z0000 Encounter for general adult medical examination without abnormal findings: Secondary | ICD-10-CM | POA: Diagnosis not present

## 2011-08-16 ENCOUNTER — Encounter (INDEPENDENT_AMBULATORY_CARE_PROVIDER_SITE_OTHER): Payer: Self-pay | Admitting: Surgery

## 2011-08-16 ENCOUNTER — Ambulatory Visit (INDEPENDENT_AMBULATORY_CARE_PROVIDER_SITE_OTHER): Payer: Medicare Other | Admitting: Surgery

## 2011-08-16 ENCOUNTER — Encounter (INDEPENDENT_AMBULATORY_CARE_PROVIDER_SITE_OTHER): Payer: Self-pay

## 2011-08-16 VITALS — BP 126/78 | HR 60 | Temp 98.1°F | Resp 18 | Ht 65.0 in | Wt 125.8 lb

## 2011-08-16 DIAGNOSIS — E210 Primary hyperparathyroidism: Secondary | ICD-10-CM

## 2011-08-16 NOTE — Patient Instructions (Signed)
  COCOA BUTTER & VITAMIN E CREAM  ([employer] or other brand)  Apply cocoa butter/vitamin E cream to your incision 2 - 3 times daily.  Massage cream into incision for one minute with each application.  Use sunscreen (50 SPF or higher) for first 6 months after surgery if area is exposed to sun.  You may substitute Mederma or other scar reducing creams as desired.   

## 2011-08-16 NOTE — Progress Notes (Signed)
General Surgery [facility]  Visit Diagnoses: 1. Hyperparathyroidism, primary     HISTORY: Patient is a 66 year old female who underwent parathyroidectomy for primary hyperparathyroidism. Calcium levels have remained normal. Her most recent level in May 2013 is unchanged at 9.9. Intact PTH levels have slowly fallen. Her preoperative level was 144. Earlier this year her level was 80.8. Her level this past week is now 70.  PERTINENT REVIEW OF SYSTEMS: No specific complaints. No history of nephrolithiasis.  EXAM: HEENT: normocephalic; pupils equal and reactive; sclerae clear; dentition good; mucous membranes moist NECK:  symmetric on extension; no palpable anterior or posterior cervical lymphadenopathy; no supraclavicular masses; no tenderness; well healed surgical incision CHEST: clear to auscultation bilaterally without rales, rhonchi, or wheezes CARDIAC: regular rate and rhythm without significant murmur; peripheral pulses are full EXT:  non-tender without edema; no deformity NEURO: no gross focal deficits; no sign of tremor   IMPRESSION: Status post minimally invasive parathyroidectomy, normalization of serum calcium level, improving intact PTH level  PLAN: The patient does not require further surgical followup at this time. I would ask her primary physician to check a serum calcium level at least annually. Patient will return to see me in this office as needed.  Velora Heckler, MD, FACS General & Endocrine Surgery [facility]

## 2011-08-18 ENCOUNTER — Ambulatory Visit: Payer: Medicare Other | Admitting: Gastroenterology

## 2011-09-08 ENCOUNTER — Encounter (INDEPENDENT_AMBULATORY_CARE_PROVIDER_SITE_OTHER): Payer: Self-pay

## 2011-10-02 ENCOUNTER — Ambulatory Visit: Payer: Medicare Other

## 2011-10-02 DIAGNOSIS — G894 Chronic pain syndrome: Secondary | ICD-10-CM | POA: Diagnosis not present

## 2011-10-02 DIAGNOSIS — M545 Low back pain: Secondary | ICD-10-CM | POA: Diagnosis not present

## 2011-10-06 ENCOUNTER — Ambulatory Visit
Admission: RE | Admit: 2011-10-06 | Discharge: 2011-10-06 | Disposition: A | Discharge status: Home / Self Care | Payer: Medicare Other | Source: Ambulatory Visit | Attending: Internal Medicine | Admitting: Internal Medicine

## 2011-10-06 DIAGNOSIS — Z1231 Encounter for screening mammogram for malignant neoplasm of breast: Secondary | ICD-10-CM

## 2011-10-16 DIAGNOSIS — F330 Major depressive disorder, recurrent, mild: Secondary | ICD-10-CM | POA: Diagnosis not present

## 2011-10-16 DIAGNOSIS — F410 Panic disorder [episodic paroxysmal anxiety] without agoraphobia: Secondary | ICD-10-CM | POA: Diagnosis not present

## 2011-11-28 DIAGNOSIS — J019 Acute sinusitis, unspecified: Secondary | ICD-10-CM | POA: Diagnosis not present

## 2011-11-28 DIAGNOSIS — H6120 Impacted cerumen, unspecified ear: Secondary | ICD-10-CM | POA: Diagnosis not present

## 2011-11-30 DIAGNOSIS — R5383 Other fatigue: Secondary | ICD-10-CM | POA: Diagnosis not present

## 2011-11-30 DIAGNOSIS — R509 Fever, unspecified: Secondary | ICD-10-CM | POA: Diagnosis not present

## 2011-11-30 DIAGNOSIS — E213 Hyperparathyroidism, unspecified: Secondary | ICD-10-CM | POA: Diagnosis not present

## 2011-11-30 DIAGNOSIS — R5381 Other malaise: Secondary | ICD-10-CM | POA: Diagnosis not present

## 2011-11-30 DIAGNOSIS — D649 Anemia, unspecified: Secondary | ICD-10-CM | POA: Diagnosis not present

## 2011-11-30 DIAGNOSIS — R110 Nausea: Secondary | ICD-10-CM | POA: Diagnosis not present

## 2011-12-20 ENCOUNTER — Encounter (HOSPITAL_COMMUNITY): Payer: Self-pay

## 2011-12-20 ENCOUNTER — Emergency Department (HOSPITAL_COMMUNITY)
Admission: EM | Admit: 2011-12-20 | Discharge: 2011-12-20 | Disposition: A | Discharge status: Home / Self Care | Payer: Medicare Other | Attending: Emergency Medicine | Admitting: Emergency Medicine

## 2011-12-20 ENCOUNTER — Emergency Department (HOSPITAL_COMMUNITY): Payer: Medicare Other

## 2011-12-20 DIAGNOSIS — F329 Major depressive disorder, single episode, unspecified: Secondary | ICD-10-CM | POA: Insufficient documentation

## 2011-12-20 DIAGNOSIS — R0602 Shortness of breath: Secondary | ICD-10-CM | POA: Insufficient documentation

## 2011-12-20 DIAGNOSIS — Z87891 Personal history of nicotine dependence: Secondary | ICD-10-CM | POA: Diagnosis not present

## 2011-12-20 DIAGNOSIS — J189 Pneumonia, unspecified organism: Secondary | ICD-10-CM | POA: Insufficient documentation

## 2011-12-20 DIAGNOSIS — K219 Gastro-esophageal reflux disease without esophagitis: Secondary | ICD-10-CM | POA: Insufficient documentation

## 2011-12-20 DIAGNOSIS — Z8719 Personal history of other diseases of the digestive system: Secondary | ICD-10-CM | POA: Diagnosis not present

## 2011-12-20 DIAGNOSIS — Z8673 Personal history of transient ischemic attack (TIA), and cerebral infarction without residual deficits: Secondary | ICD-10-CM | POA: Insufficient documentation

## 2011-12-20 DIAGNOSIS — I10 Essential (primary) hypertension: Secondary | ICD-10-CM | POA: Insufficient documentation

## 2011-12-20 DIAGNOSIS — F411 Generalized anxiety disorder: Secondary | ICD-10-CM | POA: Insufficient documentation

## 2011-12-20 DIAGNOSIS — Z79899 Other long term (current) drug therapy: Secondary | ICD-10-CM | POA: Diagnosis not present

## 2011-12-20 DIAGNOSIS — E785 Hyperlipidemia, unspecified: Secondary | ICD-10-CM | POA: Insufficient documentation

## 2011-12-20 DIAGNOSIS — R079 Chest pain, unspecified: Secondary | ICD-10-CM | POA: Diagnosis not present

## 2011-12-20 DIAGNOSIS — Z8739 Personal history of other diseases of the musculoskeletal system and connective tissue: Secondary | ICD-10-CM | POA: Diagnosis not present

## 2011-12-20 DIAGNOSIS — R0789 Other chest pain: Secondary | ICD-10-CM | POA: Diagnosis not present

## 2011-12-20 DIAGNOSIS — F3289 Other specified depressive episodes: Secondary | ICD-10-CM | POA: Insufficient documentation

## 2011-12-20 DIAGNOSIS — I5032 Chronic diastolic (congestive) heart failure: Secondary | ICD-10-CM | POA: Diagnosis not present

## 2011-12-20 LAB — CBC
HCT: 36 % (ref 36.0–46.0)
Hemoglobin: 11.6 g/dL — ABNORMAL LOW (ref 12.0–15.0)
MCH: 26.9 pg (ref 26.0–34.0)
MCHC: 32.2 g/dL (ref 30.0–36.0)
MCV: 83.3 fL (ref 78.0–100.0)
RBC: 4.32 MIL/uL (ref 3.87–5.11)

## 2011-12-20 LAB — BASIC METABOLIC PANEL
BUN: 9 mg/dL (ref 6–23)
CO2: 22 mEq/L (ref 19–32)
Calcium: 9.7 mg/dL (ref 8.4–10.5)
Creatinine, Ser: 0.82 mg/dL (ref 0.50–1.10)
GFR calc non Af Amer: 72 mL/min — ABNORMAL LOW (ref >90)
Glucose, Bld: 77 mg/dL (ref 70–99)
Sodium: 137 mEq/L (ref 135–145)

## 2011-12-20 LAB — D-DIMER, QUANTITATIVE: D-Dimer, Quant: 0.4 ug/mL-FEU (ref 0.00–0.48)

## 2011-12-20 LAB — POCT I-STAT TROPONIN I: Troponin i, poc: 0.02 ng/mL (ref 0.00–0.08)

## 2011-12-20 LAB — PROTIME-INR: Prothrombin Time: 12.6 seconds (ref 11.6–15.2)

## 2011-12-20 LAB — TROPONIN I: Troponin I: <0.3 ng/mL (ref <0.30)

## 2011-12-20 MED ORDER — LEVOFLOXACIN 500 MG PO TABS: 500.0000 mg | ORAL_TABLET | Freq: Every day | ORAL | Status: DC

## 2011-12-20 MED ORDER — ASPIRIN 81 MG PO CHEW
324.0000 mg | CHEWABLE_TABLET | Freq: Once | ORAL | Status: AC
  Administered 2011-12-20: 324 mg via ORAL
  Filled 2011-12-20: qty 4

## 2011-12-20 MED ORDER — SODIUM CHLORIDE 0.9 % IV SOLN
1000.0000 mL | INTRAVENOUS | Status: DC
  Administered 2011-12-20: 1000 mL via INTRAVENOUS

## 2011-12-20 NOTE — ED Provider Notes (Addendum)
History    CSN: [id number] Arrival date & time 12/20/11  1149 First MD Initiated Contact with Patient 12/20/11 1237      Chief Complaint  Patient presents with  . Chest Pain  . Shortness of Breath    Patient is a 66 y.o. female presenting with chest pain and shortness of breath. The history is provided by the patient.  Chest Pain The chest pain began yesterday (while taking a shower at 7 pm). Duration of episode(s) is 15 hours. Chest pain occurs constantly. Progression since onset: waxing and waning slightly but never resovled. The pain is associated with breathing. The severity of the pain is moderate. The quality of the pain is described as aching. The pain does not radiate. Primary symptoms include shortness of breath, cough (slight) and nausea. Pertinent negatives for primary symptoms include no fever, no wheezing and no vomiting.  Her past medical history is significant for CHF.  Pertinent negatives for past medical history include no CAD, no diabetes, no MI and no PE.    Shortness of Breath  Associated symptoms include chest pain, cough (slight) and shortness of breath. Pertinent negatives include no fever and no wheezing.    Past Medical History  Diagnosis Date  . Renal failure   . Diverticulitis of large intestine with perforation Hx colectomy  . Hypertension   . Barrett's esophagus   . Hyperlipemia   . Esophageal stricture   . CVA (cerebral infarction)     Mini Strokes   . Pancreatic cyst   . Gastroparesis   . Depression   . Heart failure, diastolic, chronic   . Persistent headaches   . Rapid heart rate     some times  . GERD (gastroesophageal reflux disease)   . Forgetfulness   . Irritable   . Sleep difficulties   . Bone pain   . Osteoporosis   . Fatigue   . Disturbed concentration   . Itch     skin  . Nausea     little vomitting  . TIA (transient ischemic attack)   . Macular degeneration   . Keratosis   . Anxiety   . Panic attacks   . Bruises easily    . Chills   . Generalized headaches     Past Surgical History  Procedure Date  . Kidney transplant 1984  . Appendectomy   . Colon resection 2010    Colostomy  . Colostomy 2011    Reversal   . Colostomy reversal   . Left arm surgery     due to injury  . Cataract surgery bilateral   . Esophageal dilation   . Parathyroidectomy 11/04/2010    left inferior parathyroid    Family History  Problem Relation Age of Onset  . Colon cancer Neg Hx   . Lung cancer Sister   . Cancer Sister     lung  . Heart disease Mother   . Stroke Father   . Aneurysm Brother     History  Substance Use Topics  . Smoking status: Former Smoker    Quit date: 05/30/1989  . Smokeless tobacco: Never Used  . Alcohol Use: No    OB History    Grav Para Term Preterm Abortions TAB SAB Ect Mult Living                  Review of Systems  Constitutional: Negative for fever.  Respiratory: Positive for cough (slight) and shortness of breath. Negative for wheezing.   Cardiovascular:  Positive for chest pain.  Gastrointestinal: Positive for nausea. Negative for vomiting.  Neurological: Positive for headaches.  All other systems reviewed and are negative.    Allergies  Review of patient's allergies indicates no known allergies.  Home Medications   Current Outpatient Rx  Name Route Sig Dispense Refill  . ATORVASTATIN CALCIUM 20 MG PO TABS Oral Take 20 mg by mouth daily.     . AZATHIOPRINE 50 MG PO TABS Oral Take 50 mg by mouth daily.     . CYCLOSPORINE 0.05 % OP EMUL Both Eyes Place 1 drop into both eyes 2 (two) times daily.      Marland Kitchen DIAZEPAM 2 MG PO TABS Oral Take 2 mg by mouth daily.    Marland Kitchen FLUOXETINE HCL 40 MG PO CAPS Oral Take 40 mg by mouth daily.      Marland Kitchen HYDRALAZINE HCL 25 MG PO TABS Oral Take 25 mg by mouth daily.      Marland Kitchen HYDROCODONE-ACETAMINOPHEN 10-325 MG PO TABS Oral Take 1 tablet by mouth every 6 (six) hours as needed. For pain    . METOPROLOL TARTRATE 50 MG PO TABS Oral Take 25 mg by mouth 2  (two) times daily. 1/2 two times daily    . OMEPRAZOLE 20 MG PO CPDR Oral Take 20 mg by mouth 2 (two) times daily.     Marland Kitchen PREDNISONE 5 MG PO TABS Oral Take 7.5 mg by mouth daily. One and 1/2 daily    . QUETIAPINE FUMARATE 100 MG PO TABS Oral Take 150 mg by mouth at bedtime. Takes 1 and 1/2 tablet    . VERAPAMIL HCL 120 MG PO CP24 Oral Take 120 mg by mouth.        BP 128/57  Pulse 60  Resp 12  Ht 5\' 5"  (1.651 m)  Wt 126 lb (57.153 kg)  BMI 20.97 kg/m2  SpO2 100%  Physical Exam  Nursing note and vitals reviewed. Constitutional: She appears well-developed and well-nourished. No distress.  HENT:  Head: Normocephalic and atraumatic.  Right Ear: External ear normal.  Left Ear: External ear normal.  Eyes: Conjunctivae normal are normal. Right eye exhibits no discharge. Left eye exhibits no discharge. No scleral icterus.  Neck: Neck supple. No tracheal deviation present.  Cardiovascular: Normal rate, regular rhythm and intact distal pulses.   Pulmonary/Chest: Effort normal and breath sounds normal. No stridor. No respiratory distress. She has no wheezes. She has no rales.  Abdominal: Soft. Bowel sounds are normal. She exhibits no distension. There is no tenderness. There is no rebound and no guarding.  Musculoskeletal: She exhibits no edema and no tenderness.  Neurological: She is alert. She has normal strength. No sensory deficit. Cranial nerve deficit:  no gross defecits noted. She exhibits normal muscle tone. She displays no seizure activity. Coordination normal.  Skin: Skin is warm and dry. No rash noted.  Psychiatric: She has a normal mood and affect.    ED Course  Procedures (including critical care time)  Rate: 58  Rhythm: normal sinus rhythm  QRS Axis: normal  Intervals: normal  ST/T Wave abnormalities: normal  Conduction Disutrbances:none  Narrative Interpretation:   Old EKG Reviewed: none available  Labs Reviewed  CBC - Abnormal; Notable for the following:    Hemoglobin  11.6 (*)     RDW 15.9 (*)     All other components within normal limits  BASIC METABOLIC PANEL - Abnormal; Notable for the following:    GFR calc non Af Amer 72 (*)  GFR calc Af Amer 84 (*)     All other components within normal limits  PRO B NATRIURETIC PEPTIDE - Abnormal; Notable for the following:    Pro B Natriuretic peptide (BNP) 545.6 (*)     All other components within normal limits  PROTIME-INR  APTT  TROPONIN I  D-DIMER, QUANTITATIVE  POCT I-STAT TROPONIN I   Dg Chest 2 View  12/20/2011  *RADIOLOGY REPORT*  Clinical Data: Chest pain, shortness of breath.  CHEST - 2 VIEW  Comparison: Jul 08, 2011.  Findings: Mild cardiomegaly is noted.  Left lung is clear.  Ill- defined density is noted in the right upper lobe which may represent early pneumonia.  Mild thoracic kyphosis is noted.  IMPRESSION: Ill-defined right upper lobe opacity concerning for early pneumonia; follow-up radiographs are recommended.   Original Report Authenticated By: Venita Sheffield., M.D.      MDM  Chest x-ray suggests the possibility of an early pneumonia. Patient has had a cough and states she felt like she is beginnings of a cold. It is possible this could be the etiology for her chest discomfort and mild shortness of breath.  In the emergency department, her vitals are stable. She is not tachypneic and she is not hypoxic. I doubt that her symptoms are related to pulmonary embolism. I said do not feel that this is likely to be cardiac in nature considering she's had greater than 12 hours of constant symptoms with a normal EKG and negative cardiac markers.  Patient be discharged home on antibiotics I recommend followup with her primary Dr. to make sure the symptoms resolve.  Pt states she does not do well with amoxicillin.  Will rx levaquin. No qt prolongation noted on ECG    Celene Kras, MD 12/20/11 1430

## 2011-12-20 NOTE — ED Notes (Signed)
Pt sts centralized chest pain starting last night.  Denies radiating pain.  Sts SOB, upper back pain, nausea and lightheadedness.  Pain score 3/10

## 2011-12-28 DIAGNOSIS — J189 Pneumonia, unspecified organism: Secondary | ICD-10-CM | POA: Diagnosis not present

## 2012-01-09 ENCOUNTER — Telehealth: Payer: Self-pay | Admitting: Gastroenterology

## 2012-01-09 DIAGNOSIS — C259 Malignant neoplasm of pancreas, unspecified: Secondary | ICD-10-CM

## 2012-01-09 DIAGNOSIS — K8689 Other specified diseases of pancreas: Secondary | ICD-10-CM

## 2012-01-09 DIAGNOSIS — D490 Neoplasm of unspecified behavior of digestive system: Secondary | ICD-10-CM

## 2012-01-09 DIAGNOSIS — R1900 Intra-abdominal and pelvic swelling, mass and lump, unspecified site: Secondary | ICD-10-CM

## 2012-01-09 DIAGNOSIS — D499 Neoplasm of unspecified behavior of unspecified site: Secondary | ICD-10-CM

## 2012-01-09 DIAGNOSIS — C269 Malignant neoplasm of ill-defined sites within the digestive system: Secondary | ICD-10-CM

## 2012-01-09 DIAGNOSIS — R109 Unspecified abdominal pain: Secondary | ICD-10-CM

## 2012-01-09 NOTE — Telephone Encounter (Signed)
Pt states she is having a lot of nausea and c/o pain on the left side of her abdomen above her abdomen. States the pain is pretty constant. Pt also states she is supposed to have a follow-up CT of her abdomen and pelvis also and wanted to know if she could have that done prior to being seen by Dr. Arlyce Dice. Please advise.

## 2012-01-09 NOTE — Telephone Encounter (Signed)
Yes. Order  a comprehensive metabolic profile, CBC, amylase, CA 19-9 and get a CT of the abdomen and pelvis, and an office visit

## 2012-01-10 NOTE — Telephone Encounter (Signed)
Spoke with pt and she is aware.

## 2012-01-10 NOTE — Telephone Encounter (Signed)
Labs ordered. Pt scheduled for CT of abdomen and pelvis at [place] [facility] 01/12/12. Pt to be NPO after 6:30am. Pt to arrive there at 10:15am, she will start drinking the water contrast at [facility] at 10:30am. CT following drinking the contrast. Pt scheduled to see Dr. Arlyce Dice 01/23/12 @10 :30am.

## 2012-01-12 ENCOUNTER — Ambulatory Visit (INDEPENDENT_AMBULATORY_CARE_PROVIDER_SITE_OTHER)
Admission: RE | Admit: 2012-01-12 | Discharge: 2012-01-12 | Disposition: A | Discharge status: Home / Self Care | Payer: Medicare Other | Source: Ambulatory Visit | Attending: Gastroenterology | Admitting: Gastroenterology

## 2012-01-12 ENCOUNTER — Telehealth: Payer: Self-pay | Admitting: Gastroenterology

## 2012-01-12 DIAGNOSIS — R109 Unspecified abdominal pain: Secondary | ICD-10-CM

## 2012-01-12 DIAGNOSIS — D490 Neoplasm of unspecified behavior of digestive system: Secondary | ICD-10-CM

## 2012-01-12 DIAGNOSIS — C482 Malignant neoplasm of peritoneum, unspecified: Secondary | ICD-10-CM

## 2012-01-12 DIAGNOSIS — K869 Disease of pancreas, unspecified: Secondary | ICD-10-CM

## 2012-01-12 DIAGNOSIS — K8689 Other specified diseases of pancreas: Secondary | ICD-10-CM

## 2012-01-12 DIAGNOSIS — C269 Malignant neoplasm of ill-defined sites within the digestive system: Secondary | ICD-10-CM

## 2012-01-12 DIAGNOSIS — D499 Neoplasm of unspecified behavior of unspecified site: Secondary | ICD-10-CM

## 2012-01-12 DIAGNOSIS — R1900 Intra-abdominal and pelvic swelling, mass and lump, unspecified site: Secondary | ICD-10-CM

## 2012-01-12 DIAGNOSIS — C259 Malignant neoplasm of pancreas, unspecified: Secondary | ICD-10-CM

## 2012-01-12 MED ORDER — IOHEXOL 350 MG/ML SOLN
100.0000 mL | Freq: Once | INTRAVENOUS | Status: AC | PRN
  Administered 2012-01-12: 100 mL via INTRAVENOUS

## 2012-01-12 NOTE — Telephone Encounter (Signed)
Pts daughter wants to reschedule the appt to a day she can come with her mother. Pts appt moved to 01/16/12 @11am . Daughter will notify pt of change in appt date.

## 2012-01-15 DIAGNOSIS — G894 Chronic pain syndrome: Secondary | ICD-10-CM | POA: Diagnosis not present

## 2012-01-16 ENCOUNTER — Ambulatory Visit (INDEPENDENT_AMBULATORY_CARE_PROVIDER_SITE_OTHER): Payer: Medicare Other | Admitting: Gastroenterology

## 2012-01-16 ENCOUNTER — Encounter: Payer: Self-pay | Admitting: Gastroenterology

## 2012-01-16 VITALS — BP 142/82 | HR 64 | Ht 64.8 in | Wt 127.8 lb

## 2012-01-16 DIAGNOSIS — K3184 Gastroparesis: Secondary | ICD-10-CM

## 2012-01-16 DIAGNOSIS — K2270 Barrett's esophagus without dysplasia: Secondary | ICD-10-CM | POA: Diagnosis not present

## 2012-01-16 DIAGNOSIS — D490 Neoplasm of unspecified behavior of digestive system: Secondary | ICD-10-CM | POA: Diagnosis not present

## 2012-01-16 DIAGNOSIS — K222 Esophageal obstruction: Secondary | ICD-10-CM | POA: Diagnosis not present

## 2012-01-16 MED ORDER — METOCLOPRAMIDE HCL 10 MG PO TABS: ORAL_TABLET | ORAL | Status: DC

## 2012-01-16 NOTE — Assessment & Plan Note (Signed)
Plan followup endoscopy 

## 2012-01-16 NOTE — Assessment & Plan Note (Signed)
Asymptomatic. 

## 2012-01-16 NOTE — Progress Notes (Signed)
History of Present Illness: [name] has returned complaining of nausea. She has bloating and nausea and occasional left upper quadrant pain. She's also complaining of low back pain for which she takes narcotics. Repeat CT scan, which I reviewed, demonstrated no significant change in the size of her pancreatic cystic lesion. She has gastroparesis established by gastric emptying scan.  She also complains of "pelvic pain" but she is unsure whether this emanates from her lower back. She moves her bowels regularly. Last colonoscopy was 2005.  He has a history of an esophageal stricture but denies dysphagia. Barrett's was last biopsied in 2010.    Past Medical History  Diagnosis Date  . Renal failure   . Diverticulitis of large intestine with perforation Hx colectomy  . Hypertension   . Barrett's esophagus   . Hyperlipemia   . Esophageal stricture   . CVA (cerebral infarction)     Mini Strokes   . Pancreatic cyst   . Gastroparesis   . Depression   . Heart failure, diastolic, chronic   . Persistent headaches   . Rapid heart rate     some times  . GERD (gastroesophageal reflux disease)   . Forgetfulness   . Irritable   . Sleep difficulties   . Bone pain   . Osteoporosis   . Fatigue   . Disturbed concentration   . Itch     skin  . Nausea     little vomitting  . TIA (transient ischemic attack)   . Macular degeneration   . Keratosis   . Anxiety   . Panic attacks   . Bruises easily   . Chills   . Generalized headaches    Past Surgical History  Procedure Date  . Kidney transplant 1984  . Appendectomy   . Colon resection 2010    Colostomy  . Colostomy 2011    Reversal   . Colostomy reversal   . Left arm surgery     due to injury  . Cataract surgery bilateral   . Esophageal dilation   . Parathyroidectomy 11/04/2010    left inferior parathyroid   family history includes Aneurysm in her brother; Cancer in her sister; Heart disease in her mother; Lung cancer in her sister;  and Stroke in her father.  There is no history of Colon cancer. Current Outpatient Prescriptions  Medication Sig Dispense Refill  . atorvastatin (LIPITOR) 20 MG tablet Take 20 mg by mouth daily.       Marland Kitchen azaTHIOprine (IMURAN) 50 MG tablet Take 50 mg by mouth daily.       . cycloSPORINE (RESTASIS) 0.05 % ophthalmic emulsion Place 1 drop into both eyes 2 (two) times daily.        . diazepam (VALIUM) 2 MG tablet Take 2 mg by mouth daily.      Marland Kitchen FLUoxetine (PROZAC) 40 MG capsule Take 40 mg by mouth daily.        . hydrALAZINE (APRESOLINE) 25 MG tablet Take 25 mg by mouth daily.        Marland Kitchen HYDROcodone-acetaminophen (NORCO) 10-325 MG per tablet Take 1 tablet by mouth every 6 (six) hours as needed. For pain      . levofloxacin (LEVAQUIN) 500 MG tablet Take 1 tablet (500 mg total) by mouth daily.  10 tablet  0  . metoprolol (LOPRESSOR) 50 MG tablet Take 25 mg by mouth 2 (two) times daily. 1/2 two times daily      . omeprazole (PRILOSEC) 20 MG capsule Take  20 mg by mouth 2 (two) times daily.       . predniSONE (DELTASONE) 5 MG tablet Take 7.5 mg by mouth daily. One and 1/2 daily      . QUEtiapine (SEROQUEL) 100 MG tablet Take 150 mg by mouth at bedtime. Takes 1 and 1/2 tablet      . verapamil (VERELAN PM) 120 MG 24 hr capsule Take 120 mg by mouth.         Current Facility-Administered Medications  Medication Dose Route Frequency Provider Last Rate Last Dose  . 0.9 %  sodium chloride infusion  500 mL Intravenous Continuous Louis Meckel, MD       Allergies as of 01/16/2012  . (No Known Allergies)    reports that she quit smoking about 22 years ago. She has never used smokeless tobacco. She reports that she does not drink alcohol or use illicit drugs.     Review of Systems: She has a low back pain and mild pain across her lower abdomen Pertinent positive and negative review of systems were noted in the above HPI section. All other review of systems were otherwise negative.  Vital signs were  reviewed in today's medical record Physical Exam: General: Well developed , well nourished, no acute distress Head: Normocephalic and atraumatic Eyes:  sclerae anicteric, EOMI Ears: Normal auditory acuity Mouth: No deformity or lesions Neck: Supple, no masses or thyromegaly Lungs: Clear throughout to auscultation Heart: Regular rate and rhythm; no murmurs, rubs or bruits Abdomen: Soft, and full. Some fullness in the epigastrium without a frank mass. Spleen tip is palpated. Rectal:deferred Musculoskeletal: Symmetrical with no gross deformities  Skin: No lesions on visible extremities Pulses:  Normal pulses noted Extremities: No clubbing, cyanosis, edema or deformities noted Neurological: Alert oriented x 4, grossly nonfocal Cervical Nodes:  No significant cervical adenopathy Inguinal Nodes: No significant inguinal adenopathy Psychological:  Alert and cooperative. Normal mood and affect

## 2012-01-16 NOTE — Patient Instructions (Addendum)
Contact me immediately  if you develops any side effects from her Reglan including paresthesias, tremors, confusion , weakness or muscle spasms. Your Procedure has been scheduled Separate instructions have been given You will need to follow up in 6 weeks

## 2012-01-16 NOTE — Assessment & Plan Note (Addendum)
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 Case was discussed at cancer conference. The sepsis was that the tumor will continue to grow, eventually cause obstructive symptoms or splenic vein thrombosis, and that it would be easier to resect at this time. She currently is asymptomatic. I will discuss this further with her.

## 2012-01-16 NOTE — Assessment & Plan Note (Addendum)
The patient's primary symptoms of nausea and bloating are probably due to gastroparesis. Although it is not indicated in this chart I think she may have taken Reglan in the past. Plan to restart Reglan.  Instructed patient to  contact me immediately  if she develops any side effects from her Reglan including paresthesias, tremors, confusion , weakness or muscle spasms.

## 2012-01-23 ENCOUNTER — Ambulatory Visit: Payer: Medicare Other | Admitting: Gastroenterology

## 2012-01-24 ENCOUNTER — Ambulatory Visit (AMBULATORY_SURGERY_CENTER): Payer: Medicare Other | Admitting: Gastroenterology

## 2012-01-24 ENCOUNTER — Encounter: Payer: Self-pay | Admitting: Gastroenterology

## 2012-01-24 VITALS — BP 145/76 | HR 56 | Temp 97.7°F | Resp 22 | Ht 65.0 in | Wt 127.0 lb

## 2012-01-24 DIAGNOSIS — R110 Nausea: Secondary | ICD-10-CM

## 2012-01-24 DIAGNOSIS — K224 Dyskinesia of esophagus: Secondary | ICD-10-CM

## 2012-01-24 DIAGNOSIS — R109 Unspecified abdominal pain: Secondary | ICD-10-CM

## 2012-01-24 DIAGNOSIS — D490 Neoplasm of unspecified behavior of digestive system: Secondary | ICD-10-CM | POA: Diagnosis not present

## 2012-01-24 DIAGNOSIS — K222 Esophageal obstruction: Secondary | ICD-10-CM | POA: Diagnosis not present

## 2012-01-24 DIAGNOSIS — K3184 Gastroparesis: Secondary | ICD-10-CM | POA: Diagnosis not present

## 2012-01-24 DIAGNOSIS — F411 Generalized anxiety disorder: Secondary | ICD-10-CM | POA: Diagnosis not present

## 2012-01-24 DIAGNOSIS — K2270 Barrett's esophagus without dysplasia: Secondary | ICD-10-CM

## 2012-01-24 MED ORDER — SODIUM CHLORIDE 0.9 % IV SOLN: 500.0000 mL | INTRAVENOUS | Status: DC

## 2012-01-24 NOTE — Progress Notes (Signed)
Patient did not experience any of the following events: a burn prior to discharge; a fall within the facility; wrong site/side/patient/procedure/implant event; or a hospital transfer or hospital admission upon discharge from the facility. (G8907) Patient did not have preoperative order for IV antibiotic SSI prophylaxis. (G8918)  

## 2012-01-24 NOTE — Progress Notes (Signed)
Patient ID: Morgan Cooper, female   DOB: 11-22-1945, 66 y.o.   MRN: [id number] I again discussed with the patient the consideration of surgical removal of her cystic neoplasm of the pancreas. Since it has not appreciably enlarged over the past year she would like to defer surgery and reassess in one year. If it has significantly enlarged than she would consider surgical resection.

## 2012-01-24 NOTE — Op Note (Signed)
[facility] [address] [place], [address]   ENDOSCOPY PROCEDURE REPORT  PATIENT: Morgan Cooper, Morgan Cooper.  MR#: [id number] BIRTHDATE: [birth year] , 66  yrs. old GENDER: Female ENDOSCOPIST: Louis Meckel, MD REFERRED BY:  Elby Showers, M.D. PROCEDURE DATE:  01/24/2012 PROCEDURE:  EGD w/ biopsy ASA CLASS:     Class II INDICATIONS:  history of Barrett's esophagus. MEDICATIONS: MAC sedation, administered by CRNA, propofol (Diprivan) 100mg  IV, Robinul 0.2 mg IV, and Simethicone 0.6cc PO TOPICAL ANESTHETIC: Cetacaine Spray  DESCRIPTION OF PROCEDURE: After the risks benefits and alternatives of the procedure were thoroughly explained, informed consent was obtained.  The    endoscope was introduced through the mouth and advanced to the third portion of the duodenum. Without limitations. The instrument was slowly withdrawn as the mucosa was fully examined.      At the GE junction there is again noted short segment Barrett's esophagus.  This extended approximately 2 cm.  Biopsies from all 4 quadrants were taken.   The remainder of the upper endoscopy exam was otherwise normal.  Retroflexed views revealed no abnormalities. The scope was then withdrawn from the patient and the procedure completed.  COMPLICATIONS: There were no complications. ENDOSCOPIC IMPRESSION: 1.   At the GE junction there is again noted short segment Barrett's esophagus.  This extended approximately 2 cm.  Biopsies from all 4 quadrants were taken. 2.   The remainder of the upper endoscopy exam was otherwise normal  RECOMMENDATIONS: Await biopsy results  REPEAT EXAM:  eSigned:  Louis Meckel, MD 01/24/2012 10:34 AM   CC:

## 2012-01-24 NOTE — Patient Instructions (Addendum)
Impressions/recommendations:  Short segment Barretts esophagus. Biopsies taken.  Await biopsy results.  YOU HAD AN ENDOSCOPIC PROCEDURE TODAY AT THE [facility]: Refer to the procedure report that was given to you for any specific questions about what was found during the examination.  If the procedure report does not answer your questions, please call your gastroenterologist to clarify.  If you requested that your care partner not be given the details of your procedure findings, then the procedure report has been included in a sealed envelope for you to review at your convenience later.  YOU SHOULD EXPECT: Some feelings of bloating in the abdomen. Passage of more gas than usual.  Walking can help get rid of the air that was put into your GI tract during the procedure and reduce the bloating. If you had a lower endoscopy (such as a colonoscopy or flexible sigmoidoscopy) you may notice spotting of blood in your stool or on the toilet paper. If you underwent a bowel prep for your procedure, then you may not have a normal bowel movement for a few days.  DIET: Your first meal following the procedure should be a light meal and then it is ok to progress to your normal diet.  A half-sandwich or bowl of soup is an example of a good first meal.  Heavy or fried foods are harder to digest and may make you feel nauseous or bloated.  Likewise meals heavy in dairy and vegetables can cause extra gas to form and this can also increase the bloating.  Drink plenty of fluids but you should avoid alcoholic beverages for 24 hours.  ACTIVITY: Your care partner should take you home directly after the procedure.  You should plan to take it easy, moving slowly for the rest of the day.  You can resume normal activity the day after the procedure however you should NOT DRIVE or use heavy machinery for 24 hours (because of the sedation medicines used during the test).    SYMPTOMS TO REPORT IMMEDIATELY: A  gastroenterologist can be reached at any hour.  During normal business hours, 8:30 AM to 5:00 PM Monday through Friday, call [phone].  After hours and on weekends, please call the GI answering service at [phone] who will take a message and have the physician on call contact you.   Following upper endoscopy (EGD)  Vomiting of blood or coffee ground material  New chest pain or pain under the shoulder blades  Painful or persistently difficult swallowing  New shortness of breath  Fever of 100F or higher  Black, tarry-looking stools  FOLLOW UP: If any biopsies were taken you will be contacted by phone or by letter within the next 1-3 weeks.  Call your gastroenterologist if you have not heard about the biopsies in 3 weeks.  Our staff will call the home number listed on your records the next business day following your procedure to check on you and address any questions or concerns that you may have at that time regarding the information given to you following your procedure. This is a courtesy call and so if there is no answer at the home number and we have not heard from you through the emergency physician on call, we will assume that you have returned to your regular daily activities without incident.  SIGNATURES/CONFIDENTIALITY: You and/or your care partner have signed paperwork which will be entered into your electronic medical record.  These signatures attest to the fact that that the information above on  your After Visit Summary has been reviewed and is understood.  Full responsibility of the confidentiality of this discharge information lies with you and/or your care-partner.

## 2012-01-25 ENCOUNTER — Telehealth: Payer: Self-pay | Admitting: *Deleted

## 2012-01-25 NOTE — Telephone Encounter (Signed)
  Follow up Call-  Call back number 01/24/2012 05/31/2010  Post procedure Call Back phone  # [phone]  Permission to leave phone message Yes -     Patient questions:  Message left to call us if necessary.

## 2012-01-31 ENCOUNTER — Encounter: Payer: Self-pay | Admitting: Gastroenterology

## 2012-02-06 ENCOUNTER — Telehealth: Payer: Self-pay | Admitting: Gastroenterology

## 2012-02-06 NOTE — Telephone Encounter (Signed)
New med is not helping/ Reglan   What do you recommend

## 2012-02-06 NOTE — Telephone Encounter (Signed)
Let's try erythromycin 250 mg 4 times a day.  Call back if not improved within one week

## 2012-02-08 ENCOUNTER — Telehealth: Payer: Self-pay | Admitting: *Deleted

## 2012-02-08 DIAGNOSIS — R1011 Right upper quadrant pain: Secondary | ICD-10-CM | POA: Diagnosis not present

## 2012-02-08 DIAGNOSIS — D649 Anemia, unspecified: Secondary | ICD-10-CM | POA: Diagnosis not present

## 2012-02-08 DIAGNOSIS — I10 Essential (primary) hypertension: Secondary | ICD-10-CM | POA: Diagnosis not present

## 2012-02-08 DIAGNOSIS — R109 Unspecified abdominal pain: Secondary | ICD-10-CM | POA: Diagnosis not present

## 2012-02-08 DIAGNOSIS — N2581 Secondary hyperparathyroidism of renal origin: Secondary | ICD-10-CM | POA: Diagnosis not present

## 2012-02-08 MED ORDER — ERYTHROMYCIN BASE 250 MG PO TBEC: 250.0000 mg | DELAYED_RELEASE_TABLET | Freq: Four times a day (QID) | ORAL | Status: DC

## 2012-02-08 NOTE — Telephone Encounter (Signed)
Melvia Heaps, MD 02/06/2012 2:12 PM Signed  Let's try erythromycin 250 mg 4 times a day. Call back if not improved within one week Merri Ray, [facility] 02/06/2012 1:54 PM Signed  New med is not helping/ Reglan  What do you recommend   L/M and told pt new medication was sent in to the pharmacy

## 2012-02-27 DIAGNOSIS — R3989 Other symptoms and signs involving the genitourinary system: Secondary | ICD-10-CM | POA: Diagnosis not present

## 2012-02-27 DIAGNOSIS — R339 Retention of urine, unspecified: Secondary | ICD-10-CM | POA: Diagnosis not present

## 2012-02-28 DIAGNOSIS — R51 Headache: Secondary | ICD-10-CM | POA: Diagnosis not present

## 2012-02-28 DIAGNOSIS — G501 Atypical facial pain: Secondary | ICD-10-CM | POA: Diagnosis not present

## 2012-03-04 DIAGNOSIS — R51 Headache: Secondary | ICD-10-CM | POA: Diagnosis not present

## 2012-03-04 DIAGNOSIS — G501 Atypical facial pain: Secondary | ICD-10-CM | POA: Diagnosis not present

## 2012-04-15 DIAGNOSIS — H353190 Nonexudative age-related macular degeneration, unspecified eye, stage unspecified: Secondary | ICD-10-CM | POA: Diagnosis not present

## 2012-04-15 DIAGNOSIS — H26499 Other secondary cataract, unspecified eye: Secondary | ICD-10-CM | POA: Diagnosis not present

## 2012-04-15 DIAGNOSIS — Z961 Presence of intraocular lens: Secondary | ICD-10-CM | POA: Diagnosis not present

## 2012-04-15 DIAGNOSIS — H35039 Hypertensive retinopathy, unspecified eye: Secondary | ICD-10-CM | POA: Diagnosis not present

## 2012-04-15 DIAGNOSIS — H43399 Other vitreous opacities, unspecified eye: Secondary | ICD-10-CM | POA: Diagnosis not present

## 2012-04-15 DIAGNOSIS — H04129 Dry eye syndrome of unspecified lacrimal gland: Secondary | ICD-10-CM | POA: Diagnosis not present

## 2012-04-18 DIAGNOSIS — G501 Atypical facial pain: Secondary | ICD-10-CM | POA: Diagnosis not present

## 2012-04-18 DIAGNOSIS — R51 Headache: Secondary | ICD-10-CM | POA: Diagnosis not present

## 2012-04-30 DIAGNOSIS — F330 Major depressive disorder, recurrent, mild: Secondary | ICD-10-CM | POA: Diagnosis not present

## 2012-04-30 DIAGNOSIS — F410 Panic disorder [episodic paroxysmal anxiety] without agoraphobia: Secondary | ICD-10-CM | POA: Diagnosis not present

## 2012-05-22 ENCOUNTER — Encounter (INDEPENDENT_AMBULATORY_CARE_PROVIDER_SITE_OTHER): Payer: Self-pay

## 2012-05-31 DIAGNOSIS — I10 Essential (primary) hypertension: Secondary | ICD-10-CM | POA: Diagnosis not present

## 2012-05-31 DIAGNOSIS — Z79899 Other long term (current) drug therapy: Secondary | ICD-10-CM | POA: Diagnosis not present

## 2012-05-31 DIAGNOSIS — K219 Gastro-esophageal reflux disease without esophagitis: Secondary | ICD-10-CM | POA: Diagnosis not present

## 2012-06-25 DIAGNOSIS — E785 Hyperlipidemia, unspecified: Secondary | ICD-10-CM | POA: Diagnosis not present

## 2012-06-25 DIAGNOSIS — I5032 Chronic diastolic (congestive) heart failure: Secondary | ICD-10-CM | POA: Diagnosis not present

## 2012-06-25 DIAGNOSIS — R002 Palpitations: Secondary | ICD-10-CM | POA: Diagnosis not present

## 2012-06-25 DIAGNOSIS — R0602 Shortness of breath: Secondary | ICD-10-CM | POA: Diagnosis not present

## 2012-06-28 DIAGNOSIS — R0602 Shortness of breath: Secondary | ICD-10-CM | POA: Diagnosis not present

## 2012-06-28 DIAGNOSIS — E785 Hyperlipidemia, unspecified: Secondary | ICD-10-CM | POA: Diagnosis not present

## 2012-06-28 DIAGNOSIS — I5032 Chronic diastolic (congestive) heart failure: Secondary | ICD-10-CM | POA: Diagnosis not present

## 2012-06-28 DIAGNOSIS — R002 Palpitations: Secondary | ICD-10-CM | POA: Diagnosis not present

## 2012-07-02 ENCOUNTER — Ambulatory Visit: Payer: Self-pay | Admitting: Nurse Practitioner

## 2012-07-16 DIAGNOSIS — Z79899 Other long term (current) drug therapy: Secondary | ICD-10-CM | POA: Diagnosis not present

## 2012-07-16 DIAGNOSIS — G894 Chronic pain syndrome: Secondary | ICD-10-CM | POA: Diagnosis not present

## 2012-07-16 DIAGNOSIS — M545 Low back pain: Secondary | ICD-10-CM | POA: Diagnosis not present

## 2012-07-25 DIAGNOSIS — R935 Abnormal findings on diagnostic imaging of other abdominal regions, including retroperitoneum: Secondary | ICD-10-CM | POA: Diagnosis not present

## 2012-07-26 DIAGNOSIS — F330 Major depressive disorder, recurrent, mild: Secondary | ICD-10-CM | POA: Diagnosis not present

## 2012-07-26 DIAGNOSIS — F410 Panic disorder [episodic paroxysmal anxiety] without agoraphobia: Secondary | ICD-10-CM | POA: Diagnosis not present

## 2012-10-08 DIAGNOSIS — Z940 Kidney transplant status: Secondary | ICD-10-CM | POA: Diagnosis not present

## 2012-10-08 DIAGNOSIS — N2581 Secondary hyperparathyroidism of renal origin: Secondary | ICD-10-CM | POA: Diagnosis not present

## 2012-10-08 DIAGNOSIS — D649 Anemia, unspecified: Secondary | ICD-10-CM | POA: Diagnosis not present

## 2012-10-08 DIAGNOSIS — N185 Chronic kidney disease, stage 5: Secondary | ICD-10-CM | POA: Diagnosis not present

## 2012-10-08 DIAGNOSIS — Z8349 Family history of other endocrine, nutritional and metabolic diseases: Secondary | ICD-10-CM | POA: Diagnosis not present

## 2012-10-11 DIAGNOSIS — H353190 Nonexudative age-related macular degeneration, unspecified eye, stage unspecified: Secondary | ICD-10-CM | POA: Diagnosis not present

## 2012-10-11 DIAGNOSIS — L719 Rosacea, unspecified: Secondary | ICD-10-CM | POA: Diagnosis not present

## 2012-10-11 DIAGNOSIS — H35039 Hypertensive retinopathy, unspecified eye: Secondary | ICD-10-CM | POA: Diagnosis not present

## 2012-10-11 DIAGNOSIS — Z961 Presence of intraocular lens: Secondary | ICD-10-CM | POA: Diagnosis not present

## 2012-10-18 ENCOUNTER — Telehealth: Payer: Self-pay | Admitting: *Deleted

## 2012-10-18 NOTE — Telephone Encounter (Signed)
Patient cancelled previous appt. Called to reschedule and left vm to call back to the office.

## 2012-10-22 ENCOUNTER — Other Ambulatory Visit: Payer: Self-pay

## 2012-10-22 DIAGNOSIS — Z1231 Encounter for screening mammogram for malignant neoplasm of breast: Secondary | ICD-10-CM

## 2012-10-23 ENCOUNTER — Telehealth: Payer: Self-pay | Admitting: *Deleted

## 2012-10-23 NOTE — Telephone Encounter (Signed)
Called pt to r/s with Larita Fife. Patient never seen cm. Left message for patient to call back.

## 2012-10-24 ENCOUNTER — Encounter: Payer: Self-pay | Admitting: Nurse Practitioner

## 2012-10-24 ENCOUNTER — Ambulatory Visit (INDEPENDENT_AMBULATORY_CARE_PROVIDER_SITE_OTHER): Payer: Medicare Other | Admitting: Nurse Practitioner

## 2012-10-24 VITALS — BP 127/69 | HR 59 | Ht 65.5 in | Wt 128.0 lb

## 2012-10-24 DIAGNOSIS — R51 Headache: Secondary | ICD-10-CM

## 2012-10-24 DIAGNOSIS — G459 Transient cerebral ischemic attack, unspecified: Secondary | ICD-10-CM

## 2012-10-24 DIAGNOSIS — G501 Atypical facial pain: Secondary | ICD-10-CM | POA: Diagnosis not present

## 2012-10-24 MED ORDER — GABAPENTIN 100 MG PO CAPS: ORAL_CAPSULE | ORAL | Status: DC

## 2012-10-24 NOTE — Patient Instructions (Addendum)
Try gabapentin for her facial pain as directed  Continue aspirin for secondary stroke prevention Strict control of lipids with LDL below 100 Blood pressure in good control today, below 130 systolic Followup in 6 months

## 2012-10-24 NOTE — Progress Notes (Signed)
Reason for visit followup for atypical facial pain and remote history of bilateral small cerebellar infarcts  HPI:  Morgan Cooper , 67 -year-old white female returns for followup. She was last seen by Dr. Pearlean Brownie 04/18/2012. 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 Remote history of bilateral small silent cerebellar infarcts. Vascular risk factors of HTN, Hyperlipidimia, and silent cerebral infarcts. She reports improvement in her sharp shooting hemicranial neuralgic heeadaches which are not as frequent. She however continues to have facial pain and she has stopped her Topamax due to side effects. She says the medicine made her feel funny  She denies any episodes of loss of vision, jaw claudication or  temporal tenderness. Lab work done for ESR, complement levels were normal. MRI scan of the brain done on 03/04/12 showed changes of chronic microvascular ischemia without any acute abnormality seen. She is wishing to try a different medication for her facial pain.  ROS:  14 system review of symptoms is negative except for the following Fatigue trouble swallowing, blurred vision, shortness of breath, feeling hot, joint pain, headache, dizziness, anxiety and decreased energy   Medications Current Outpatient Prescriptions on File Prior to Visit  Medication Sig Dispense Refill  . ammonium lactate (LAC-HYDRIN) 12 % lotion       . atorvastatin (LIPITOR) 20 MG tablet Take 20 mg by mouth daily.       Marland Kitchen azaTHIOprine (IMURAN) 50 MG tablet Take 50 mg by mouth daily.       . cycloSPORINE (RESTASIS) 0.05 % ophthalmic emulsion Place 1 drop into both eyes 2 (two) times daily.        . diazepam (VALIUM) 2 MG tablet Take 2 mg by mouth daily.      Marland Kitchen erythromycin (ERY-TAB) 250 MG EC tablet Take 1 tablet (250 mg total) by mouth 4 (four) times daily.  120 tablet  0  . FLUoxetine (PROZAC)  40 MG capsule Take 40 mg by mouth daily.        . hydrALAZINE (APRESOLINE) 25 MG tablet Take 25 mg by mouth daily.        Marland Kitchen HYDROcodone-acetaminophen (NORCO) 10-325 MG per tablet Take 1 tablet by mouth every 6 (six) hours as needed. For pain      . levofloxacin (LEVAQUIN) 500 MG tablet Take 1 tablet (500 mg total) by mouth daily.  10 tablet  0  . metoprolol (LOPRESSOR) 50 MG tablet Take 25 mg by mouth 2 (two) times daily. 1/2 two times daily      . omeprazole (PRILOSEC) 20 MG capsule Take 20 mg by mouth 2 (two) times daily.       . predniSONE (DELTASONE) 5 MG tablet Take 7.5 mg by mouth daily. One and 1/2 daily      . promethazine (PHENERGAN) 25 MG tablet       . QUEtiapine (SEROQUEL) 100 MG tablet Take 150 mg by mouth at bedtime. Takes 1 and 1/2 tablet      . trimethoprim (TRIMPEX) 100 MG tablet       . verapamil (VERELAN PM) 120 MG 24 hr capsule Take 120 mg by mouth.         Current Facility-Administered Medications on File Prior to Visit  Medication Dose Route Frequency Provider Last Rate Last Dose  . 0.9 %  sodium chloride infusion  500 mL Intravenous Continuous Louis Meckel, MD  Allergies No Known Allergies  Physical Exam General: well developed, well nourished, seated, in no evident distress Head: head normocephalic and atraumatic. Oropharynx benign Neck: supple with no carotid  bruits Cardiovascular: regular rate and rhythm, no murmurs Skin: Large mole right cheek  Neurologic Exam Mental Status: Awake and fully alert. Oriented to place and time. Follows all commands. Speech and language normal.   Cranial Nerves: Fundi not visualized  Pupils equal, briskly reactive to light. Extraocular movements full without nystagmus. Visual fields full to confrontation. Hearing intact and symmetric to finger snap. Facial sensation intact. Face, tongue, palate move normally and symmetrically. Neck flexion and extension normal.  Motor: Normal bulk and tone. Normal strength in all tested  extremity muscles.No focal weakness Sensory.: intact to touch and pinprick and vibratory.  Coordination: Rapid alternating movements normal in all extremities. Finger-to-nose and heel-to-shin performed accurately bilaterally. Gait and Station: Arises from chair without difficulty. Stance is normal.  Able to heel, toe and mildly unsteady with tandem walk.  Reflexes: 2+ and symmetric. Toes downgoing.     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     PLAN: Try gabapentin for her facial pain as directed  Continue aspirin for secondary stroke prevention Strict control of lipids with LDL below 100 Blood pressure in good control today, below 130 systolic Followup in 6 months  Morgan Cooper, GNP-BC APRN

## 2012-10-25 NOTE — Telephone Encounter (Signed)
Patient never returned call. Seen by CM

## 2012-11-01 ENCOUNTER — Ambulatory Visit
Admission: RE | Admit: 2012-11-01 | Discharge: 2012-11-01 | Disposition: A | Discharge status: Home / Self Care | Payer: Medicare Other | Source: Ambulatory Visit

## 2012-11-01 DIAGNOSIS — Z1231 Encounter for screening mammogram for malignant neoplasm of breast: Secondary | ICD-10-CM | POA: Diagnosis not present

## 2012-11-13 DIAGNOSIS — R3915 Urgency of urination: Secondary | ICD-10-CM | POA: Diagnosis not present

## 2012-11-13 DIAGNOSIS — N390 Urinary tract infection, site not specified: Secondary | ICD-10-CM | POA: Diagnosis not present

## 2012-11-13 DIAGNOSIS — R300 Dysuria: Secondary | ICD-10-CM | POA: Diagnosis not present

## 2012-11-15 DIAGNOSIS — F330 Major depressive disorder, recurrent, mild: Secondary | ICD-10-CM | POA: Diagnosis not present

## 2012-11-15 DIAGNOSIS — F410 Panic disorder [episodic paroxysmal anxiety] without agoraphobia: Secondary | ICD-10-CM | POA: Diagnosis not present

## 2012-11-28 DIAGNOSIS — M545 Low back pain: Secondary | ICD-10-CM | POA: Diagnosis not present

## 2012-12-03 DIAGNOSIS — H5789 Other specified disorders of eye and adnexa: Secondary | ICD-10-CM | POA: Diagnosis not present

## 2012-12-03 DIAGNOSIS — N390 Urinary tract infection, site not specified: Secondary | ICD-10-CM | POA: Diagnosis not present

## 2012-12-03 DIAGNOSIS — E785 Hyperlipidemia, unspecified: Secondary | ICD-10-CM | POA: Diagnosis not present

## 2012-12-03 DIAGNOSIS — Z940 Kidney transplant status: Secondary | ICD-10-CM | POA: Diagnosis not present

## 2012-12-03 DIAGNOSIS — I10 Essential (primary) hypertension: Secondary | ICD-10-CM | POA: Diagnosis not present

## 2012-12-03 DIAGNOSIS — D649 Anemia, unspecified: Secondary | ICD-10-CM | POA: Diagnosis not present

## 2012-12-03 DIAGNOSIS — F329 Major depressive disorder, single episode, unspecified: Secondary | ICD-10-CM | POA: Diagnosis not present

## 2012-12-16 ENCOUNTER — Other Ambulatory Visit (INDEPENDENT_AMBULATORY_CARE_PROVIDER_SITE_OTHER): Payer: Self-pay

## 2012-12-16 ENCOUNTER — Ambulatory Visit (INDEPENDENT_AMBULATORY_CARE_PROVIDER_SITE_OTHER): Payer: Medicare Other | Admitting: Surgery

## 2012-12-16 ENCOUNTER — Other Ambulatory Visit (INDEPENDENT_AMBULATORY_CARE_PROVIDER_SITE_OTHER): Payer: Self-pay | Admitting: Surgery

## 2012-12-16 ENCOUNTER — Encounter (INDEPENDENT_AMBULATORY_CARE_PROVIDER_SITE_OTHER): Payer: Self-pay | Admitting: Surgery

## 2012-12-16 VITALS — BP 118/80 | HR 72 | Temp 98.9°F | Resp 15 | Ht 65.0 in | Wt 128.0 lb

## 2012-12-16 DIAGNOSIS — E349 Endocrine disorder, unspecified: Secondary | ICD-10-CM

## 2012-12-16 NOTE — Patient Instructions (Signed)
Please register for MyChart so that you may access your results online.  Sheli Dorin M. Montravious Weigelt, MD, FACS [facility] Office: [phone]   

## 2012-12-16 NOTE — Progress Notes (Signed)
General Surgery [facility]  Chief Complaint  Patient presents with  . Follow-up    eval elevated PTH - referral from Dr. Terrace Arabia, [email or web address]     HISTORY: The patient is a 67 year old female known to my practice. She underwent minimally invasive parathyroidectomy in September 2012. 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 Following surgery her calcium levels normalized at 9.9 and her intact PTH level fell from 144 down to 70.  The patient had recent laboratory studies performed by her primary care physician. This shows a normal serum calcium level of 9.8. However her intact PTH level is mildly elevated at 95.  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 She continues to have normal renal function with a creatinine of 0.97 and a BUN of 11.  Past Medical History  Diagnosis Date  . Renal failure   . Diverticulitis of large intestine with perforation Hx colectomy  . Hypertension   . Barrett's esophagus   . Hyperlipemia   . Esophageal stricture   . CVA (cerebral infarction)     Mini Strokes   . Pancreatic cyst   . Gastroparesis   . Depression   . Heart failure, diastolic, chronic   . Persistent headaches   . Rapid heart rate     some times  . GERD (gastroesophageal reflux disease)   . Forgetfulness   . Irritable   . Sleep difficulties   . Bone pain   . Osteoporosis   . Fatigue   . Disturbed concentration   . Itch     skin  . Nausea     little vomitting  . TIA (transient ischemic attack)   . Macular degeneration   . Keratosis   . Anxiety   . Panic attacks   . Bruises easily   . Chills   . Generalized headaches     Current Outpatient Prescriptions  Medication Sig Dispense Refill  . ammonium lactate (LAC-HYDRIN) 12 % lotion       . atorvastatin (LIPITOR) 20 MG tablet Take 20 mg by mouth daily.       Marland Kitchen azaTHIOprine (IMURAN) 50 MG tablet Take 50 mg by mouth daily.       .  cycloSPORINE (RESTASIS) 0.05 % ophthalmic emulsion Place 1 drop into both eyes 2 (two) times daily.        . diazepam (VALIUM) 2 MG tablet Take 2 mg by mouth daily.      Marland Kitchen FLUoxetine (PROZAC) 40 MG capsule Take 40 mg by mouth daily.        Marland Kitchen HYDROcodone-acetaminophen (NORCO) 10-325 MG per tablet Take 1 tablet by mouth every 6 (six) hours as needed. For pain      . metoprolol (LOPRESSOR) 50 MG tablet Take 25 mg by mouth 2 (two) times daily. 1/2 two times daily      . omeprazole (PRILOSEC) 20 MG capsule Take 20 mg by mouth 2 (two) times daily.       . predniSONE (DELTASONE) 5 MG tablet Take 7.5 mg by mouth daily. One and 1/2 daily      . promethazine (PHENERGAN) 25 MG tablet       . QUEtiapine (SEROQUEL) 100 MG tablet Take 150 mg by mouth at bedtime. Takes 1 and 1/2 tablet      . verapamil (VERELAN PM) 120 MG 24 hr capsule Take 120 mg by mouth.         No current facility-administered medications for  this visit.    No Known Allergies  Family History  Problem Relation Age of Onset  . Colon cancer Neg Hx   . Lung cancer Sister   . Cancer Sister     lung  . Heart disease Mother   . Stroke Father   . Aneurysm Brother     History   Social History  . Marital Status: Widowed    Spouse Name: N/A    Number of Children: N/A  . Years of Education: N/A   Occupational History  . Retired    Social History Main Topics  . Smoking status: Former Smoker    Quit date: 05/30/1989  . Smokeless tobacco: Never Used  . Alcohol Use: No  . Drug Use: No  . Sexual Activity: None   Other Topics Concern  . None   Social History Narrative  . None    REVIEW OF SYSTEMS - PERTINENT POSITIVES ONLY: Patient notes chronic fatigue. She has significant problems with headache. She denies nephrolithiasis.  EXAM: Filed Vitals:   12/16/12 0857  BP: 118/80  Pulse: 72  Temp: 98.9 F (37.2 C)  Resp: 15    HEENT: normocephalic; pupils equal and reactive; sclerae clear; dentition fair; mucous  membranes moist NECK:  Well-healed surgical incision left lower anterior neck; no palpable masses in the thyroid bed; symmetric on extension; no palpable anterior or posterior cervical lymphadenopathy; no supraclavicular masses; no tenderness CHEST: clear to auscultation bilaterally without rales, rhonchi, or wheezes CARDIAC: regular rate and rhythm without significant murmur; peripheral pulses are full ABDOMEN: soft without distension; bowel sounds present; no mass; no hepatosplenomegaly; no hernia EXT:  non-tender without edema; no deformity NEURO: no gross focal deficits; mild tremor   LABORATORY RESULTS: See [facility] ([facility]L-Epic) for most recent results  RADIOLOGY RESULTS: See [facility] (CHL-Epic) for most recent results  IMPRESSION: #1 personal history of primary hyperparathyroidism, status post parathyroidectomy 2012 #2 elevated intact PTH level of undetermined significance  PLAN: The patient and I discussed the above results. We will obtain a 24-hour urine collection for calcium. We will also obtain a 25 hydroxy vitamin D level. I will contact the patient with the results.  Velora Heckler, MD, FACS General & Endocrine Surgery [facility]  Primary Care Physician: Provider Not In System

## 2012-12-17 LAB — VITAMIN D 25 HYDROXY (VIT D DEFICIENCY, FRACTURES): Vit D, 25-Hydroxy: 15 ng/mL — ABNORMAL LOW (ref 30–89)

## 2012-12-18 ENCOUNTER — Other Ambulatory Visit (INDEPENDENT_AMBULATORY_CARE_PROVIDER_SITE_OTHER): Payer: Self-pay | Admitting: Surgery

## 2012-12-18 DIAGNOSIS — E349 Endocrine disorder, unspecified: Secondary | ICD-10-CM | POA: Diagnosis not present

## 2012-12-24 ENCOUNTER — Encounter (INDEPENDENT_AMBULATORY_CARE_PROVIDER_SITE_OTHER): Payer: Self-pay

## 2012-12-26 ENCOUNTER — Other Ambulatory Visit: Payer: Self-pay

## 2012-12-26 ENCOUNTER — Telehealth (INDEPENDENT_AMBULATORY_CARE_PROVIDER_SITE_OTHER): Payer: Self-pay | Admitting: *Deleted

## 2012-12-26 NOTE — Telephone Encounter (Signed)
Patient called asking about the lab work (urine calcium) she had done last week.  Explained I would send a message then we would let her know.  Patient states understanding and agreeable.

## 2012-12-30 ENCOUNTER — Encounter (INDEPENDENT_AMBULATORY_CARE_PROVIDER_SITE_OTHER): Payer: Self-pay | Admitting: Surgery

## 2012-12-30 ENCOUNTER — Other Ambulatory Visit (INDEPENDENT_AMBULATORY_CARE_PROVIDER_SITE_OTHER): Payer: Self-pay | Admitting: Surgery

## 2012-12-30 ENCOUNTER — Telehealth (INDEPENDENT_AMBULATORY_CARE_PROVIDER_SITE_OTHER): Payer: Self-pay

## 2012-12-30 DIAGNOSIS — E559 Vitamin D deficiency, unspecified: Secondary | ICD-10-CM | POA: Insufficient documentation

## 2012-12-30 MED ORDER — VITAMIN D (ERGOCALCIFEROL) 1.25 MG (50000 UNIT) PO CAPS: 50000.0000 [IU] | ORAL_CAPSULE | ORAL | Status: DC

## 2012-12-30 NOTE — Telephone Encounter (Signed)
Wrong doctor, will route to Dr Gerrit Friends and Arline Asp

## 2012-12-30 NOTE — Telephone Encounter (Signed)
Per Dr Ardine Eng note attached to labs pt advised of need to be on Ergocalciferol for 3-4 months and repeat TSH,Vit D and Calcium in 6 months. Pt advised of test results. Pt aware RX sent to her pharmacy and to start asap. Pt states she understands. Lab orders in epic.

## 2012-12-30 NOTE — Telephone Encounter (Signed)
Lab slip mailed to pt for April 2015. Unable to do Vit D due to medicare will not cover but once a year.

## 2012-12-30 NOTE — Addendum Note (Signed)
Addended by: Joanette Gula on: 12/30/2012 03:38 PM   Modules accepted: Orders

## 2012-12-30 NOTE — Telephone Encounter (Signed)
Patient states she had her labs done. Asking what should be done next?

## 2013-01-22 DIAGNOSIS — Z23 Encounter for immunization: Secondary | ICD-10-CM | POA: Diagnosis not present

## 2013-01-22 DIAGNOSIS — R51 Headache: Secondary | ICD-10-CM | POA: Diagnosis not present

## 2013-01-22 DIAGNOSIS — N644 Mastodynia: Secondary | ICD-10-CM | POA: Diagnosis not present

## 2013-01-22 DIAGNOSIS — N390 Urinary tract infection, site not specified: Secondary | ICD-10-CM | POA: Diagnosis not present

## 2013-02-11 DIAGNOSIS — N390 Urinary tract infection, site not specified: Secondary | ICD-10-CM | POA: Diagnosis not present

## 2013-02-26 DIAGNOSIS — F330 Major depressive disorder, recurrent, mild: Secondary | ICD-10-CM | POA: Diagnosis not present

## 2013-03-03 ENCOUNTER — Encounter: Payer: Self-pay | Admitting: Physician Assistant

## 2013-03-03 ENCOUNTER — Telehealth (INDEPENDENT_AMBULATORY_CARE_PROVIDER_SITE_OTHER): Payer: Self-pay

## 2013-03-03 DIAGNOSIS — M810 Age-related osteoporosis without current pathological fracture: Secondary | ICD-10-CM | POA: Diagnosis not present

## 2013-03-03 DIAGNOSIS — R5381 Other malaise: Secondary | ICD-10-CM | POA: Diagnosis not present

## 2013-03-03 NOTE — Telephone Encounter (Signed)
Pt's daughter calling today.  Pt was last seen by Dr. Harlow Asa in October for f/u of parathyroid.  Daughter says her mother has been extremely fatigued, having a lot of bone pain and her mental status is confused.  She has an appt today with her PCP to be evaluated.  Requested that her PCP draw PTH Intact, Ca, and Hydroxy 25 Vitamin D and have those results faxed to Dr. Harlow Asa.  He will review the labs and contact the patient with recommendations.

## 2013-03-06 ENCOUNTER — Encounter (INDEPENDENT_AMBULATORY_CARE_PROVIDER_SITE_OTHER): Payer: Self-pay | Admitting: Surgery

## 2013-03-06 ENCOUNTER — Telehealth (INDEPENDENT_AMBULATORY_CARE_PROVIDER_SITE_OTHER): Payer: Self-pay

## 2013-03-06 NOTE — Telephone Encounter (Signed)
Received the following message:  Dois Davenport, LPN at 7/42/5956 3:87 AM     Status: Signed        Pts daughter called wanting to know if Dr Harlow Asa has received pts recent labs to review. Pt is to follow up in April 2015. I advised her that labs are here and will be sent to Dr Harlow Asa to review. She states Dr Kelton Pillar has advised her of lab results. She states pt has not been feeling well lately and is being worked up by her PCP. Pt will keep f/u appt April and continue her Vit D since Vit D slightly low. I advised her this will be reviewed with Dr Harlow Asa.     Labs dated 03/03/2013 are provided to me for review.  Intact PTH level is normal at 59.  Serum calcium level is normal at 9.6.  No evidence of parathyroid disease based on these studies.  Earnstine Regal, MD, [facility] Office: [phone]

## 2013-03-06 NOTE — Telephone Encounter (Signed)
Pts daughter called wanting to know if Dr Harlow Asa has received pts recent labs to review. Pt is to follow up in April 2015. I advised her that labs are here and will be sent to Dr Harlow Asa to review. She states Dr Kelton Pillar has advised her of lab results. She states pt has not been feeling well lately and is being worked up by her PCP. Pt will keep f/u appt April and continue her Vit D since Vit D slightly low. I advised her this will be reviewed with Dr Harlow Asa.

## 2013-03-12 ENCOUNTER — Telehealth: Payer: Self-pay | Admitting: Surgery

## 2013-03-12 DIAGNOSIS — E210 Primary hyperparathyroidism: Secondary | ICD-10-CM

## 2013-03-12 NOTE — Telephone Encounter (Signed)
Morgan Cooper:  I have reviewed your lab work from October 2014 and from Jan 2015.  I do not know why your PTH level was elevated in October at 95.  At that time your calcium level was normal at 9.8.  Therefore you do NOT have primary hyperparathyroidism by definition.  The repeat lab work in January 2015 shows a normal PTH of 59 and a normal calcium of 9.6.  Again, no evidence of parathyroid disease.  I would recommend taking a daily Vitamin D supplement and having a calcium level checked at least annually.  I would not check further PTH levels unless the calcium level was abnormal.  I will send this information to your primary care doctor.  Earnstine Regal, MD, [facility] Office: [phone]

## 2013-03-18 ENCOUNTER — Encounter: Payer: Self-pay | Admitting: Gastroenterology

## 2013-03-25 ENCOUNTER — Encounter: Payer: Self-pay | Admitting: Gastroenterology

## 2013-03-31 DIAGNOSIS — Z940 Kidney transplant status: Secondary | ICD-10-CM | POA: Diagnosis not present

## 2013-03-31 DIAGNOSIS — N039 Chronic nephritic syndrome with unspecified morphologic changes: Secondary | ICD-10-CM | POA: Diagnosis not present

## 2013-03-31 DIAGNOSIS — I120 Hypertensive chronic kidney disease with stage 5 chronic kidney disease or end stage renal disease: Secondary | ICD-10-CM | POA: Diagnosis not present

## 2013-03-31 DIAGNOSIS — D631 Anemia in chronic kidney disease: Secondary | ICD-10-CM | POA: Diagnosis not present

## 2013-03-31 DIAGNOSIS — E785 Hyperlipidemia, unspecified: Secondary | ICD-10-CM | POA: Diagnosis not present

## 2013-03-31 DIAGNOSIS — N185 Chronic kidney disease, stage 5: Secondary | ICD-10-CM | POA: Diagnosis not present

## 2013-03-31 DIAGNOSIS — N2581 Secondary hyperparathyroidism of renal origin: Secondary | ICD-10-CM | POA: Diagnosis not present

## 2013-04-01 DIAGNOSIS — Z0000 Encounter for general adult medical examination without abnormal findings: Secondary | ICD-10-CM | POA: Diagnosis not present

## 2013-04-01 DIAGNOSIS — F3289 Other specified depressive episodes: Secondary | ICD-10-CM | POA: Diagnosis not present

## 2013-04-01 DIAGNOSIS — I10 Essential (primary) hypertension: Secondary | ICD-10-CM | POA: Diagnosis not present

## 2013-04-01 DIAGNOSIS — F329 Major depressive disorder, single episode, unspecified: Secondary | ICD-10-CM | POA: Diagnosis not present

## 2013-04-01 DIAGNOSIS — M810 Age-related osteoporosis without current pathological fracture: Secondary | ICD-10-CM | POA: Diagnosis not present

## 2013-04-10 ENCOUNTER — Encounter (INDEPENDENT_AMBULATORY_CARE_PROVIDER_SITE_OTHER): Payer: Self-pay

## 2013-04-20 ENCOUNTER — Encounter: Payer: Self-pay | Admitting: Gastroenterology

## 2013-04-21 ENCOUNTER — Telehealth: Payer: Self-pay | Admitting: Gastroenterology

## 2013-04-21 NOTE — Telephone Encounter (Signed)
Pt has pancreatic pseudocyst. She has scheduled an OV to come and discuss with Dr. Deatra Ina regarding this and symptoms she has been having. Pt would like to know if she needs to have a CT scan prior to the OV with Dr. Deatra Ina or just wait until after the visit. Please advise.

## 2013-04-22 DIAGNOSIS — M545 Low back pain, unspecified: Secondary | ICD-10-CM | POA: Diagnosis not present

## 2013-04-22 NOTE — Telephone Encounter (Signed)
CT of abdomen and pelvis?

## 2013-04-22 NOTE — Telephone Encounter (Signed)
Yes

## 2013-04-22 NOTE — Telephone Encounter (Signed)
Repeat CT prior to office visit

## 2013-04-23 ENCOUNTER — Ambulatory Visit: Payer: Medicare Other | Admitting: Nurse Practitioner

## 2013-04-23 DIAGNOSIS — M810 Age-related osteoporosis without current pathological fracture: Secondary | ICD-10-CM | POA: Diagnosis not present

## 2013-04-25 ENCOUNTER — Other Ambulatory Visit: Payer: Self-pay

## 2013-04-25 DIAGNOSIS — K863 Pseudocyst of pancreas: Secondary | ICD-10-CM

## 2013-04-25 NOTE — Telephone Encounter (Signed)
Pt scheduled for CT of a/p at [facility] 05/05/13@9am . Pt to be NPO after midnight, drink bottle 1 of contrast at 7am, bottle 2 at 8am. Pt to come by the lab and have blood drawn prior to CT. Pt to also come pick up contrast from the office. Pt aware.

## 2013-04-28 ENCOUNTER — Other Ambulatory Visit (INDEPENDENT_AMBULATORY_CARE_PROVIDER_SITE_OTHER): Payer: Medicare Other

## 2013-04-28 DIAGNOSIS — K863 Pseudocyst of pancreas: Secondary | ICD-10-CM | POA: Diagnosis not present

## 2013-04-28 DIAGNOSIS — K862 Cyst of pancreas: Secondary | ICD-10-CM | POA: Diagnosis not present

## 2013-04-29 LAB — COMPREHENSIVE METABOLIC PANEL
ALK PHOS: 61 U/L (ref 39–117)
ALT: 14 U/L (ref 0–35)
AST: 20 U/L (ref 0–37)
Albumin: 4.4 g/dL (ref 3.5–5.2)
BILIRUBIN TOTAL: 0.6 mg/dL (ref 0.3–1.2)
BUN: 13 mg/dL (ref 6–23)
CO2: 22 mEq/L (ref 19–32)
Calcium: 9.9 mg/dL (ref 8.4–10.5)
Chloride: 108 mEq/L (ref 96–112)
Creatinine, Ser: 1.1 mg/dL (ref 0.4–1.2)
GFR: 51.3 mL/min — ABNORMAL LOW (ref >60.00)
GLUCOSE: 92 mg/dL (ref 70–99)
Potassium: 4.6 mEq/L (ref 3.5–5.1)
SODIUM: 141 meq/L (ref 135–145)
TOTAL PROTEIN: 7.8 g/dL (ref 6.0–8.3)

## 2013-05-05 ENCOUNTER — Ambulatory Visit (INDEPENDENT_AMBULATORY_CARE_PROVIDER_SITE_OTHER)
Admission: RE | Admit: 2013-05-05 | Discharge: 2013-05-05 | Disposition: A | Discharge status: Home / Self Care | Payer: Medicare Other | Source: Ambulatory Visit | Attending: Gastroenterology | Admitting: Gastroenterology

## 2013-05-05 DIAGNOSIS — K863 Pseudocyst of pancreas: Secondary | ICD-10-CM | POA: Diagnosis not present

## 2013-05-05 DIAGNOSIS — K862 Cyst of pancreas: Secondary | ICD-10-CM | POA: Diagnosis not present

## 2013-05-05 DIAGNOSIS — I77811 Abdominal aortic ectasia: Secondary | ICD-10-CM | POA: Diagnosis not present

## 2013-05-05 MED ORDER — IOHEXOL 300 MG/ML  SOLN
100.0000 mL | Freq: Once | INTRAMUSCULAR | Status: AC | PRN
  Administered 2013-05-05: 100 mL via INTRAVENOUS

## 2013-05-08 ENCOUNTER — Encounter: Payer: Self-pay | Admitting: *Deleted

## 2013-05-08 ENCOUNTER — Encounter: Payer: Self-pay | Admitting: Interventional Cardiology

## 2013-05-13 DIAGNOSIS — M545 Low back pain, unspecified: Secondary | ICD-10-CM | POA: Diagnosis not present

## 2013-05-14 DIAGNOSIS — N185 Chronic kidney disease, stage 5: Secondary | ICD-10-CM | POA: Diagnosis not present

## 2013-05-14 DIAGNOSIS — N2581 Secondary hyperparathyroidism of renal origin: Secondary | ICD-10-CM | POA: Diagnosis not present

## 2013-05-14 DIAGNOSIS — D631 Anemia in chronic kidney disease: Secondary | ICD-10-CM | POA: Diagnosis not present

## 2013-05-14 DIAGNOSIS — I120 Hypertensive chronic kidney disease with stage 5 chronic kidney disease or end stage renal disease: Secondary | ICD-10-CM | POA: Diagnosis not present

## 2013-05-15 ENCOUNTER — Ambulatory Visit (AMBULATORY_SURGERY_CENTER): Payer: Self-pay | Admitting: *Deleted

## 2013-05-15 VITALS — Ht 65.0 in | Wt 128.6 lb

## 2013-05-15 DIAGNOSIS — Z1211 Encounter for screening for malignant neoplasm of colon: Secondary | ICD-10-CM

## 2013-05-15 MED ORDER — MOVIPREP 100 G PO SOLR: ORAL | Status: DC

## 2013-05-15 NOTE — Progress Notes (Signed)
No allergies to eggs or soy. No problems with anesthesia.  

## 2013-05-19 ENCOUNTER — Encounter: Payer: Self-pay | Admitting: Gastroenterology

## 2013-05-19 ENCOUNTER — Ambulatory Visit (INDEPENDENT_AMBULATORY_CARE_PROVIDER_SITE_OTHER): Payer: Medicare Other | Admitting: Gastroenterology

## 2013-05-19 VITALS — BP 140/86 | HR 68 | Ht 65.0 in | Wt 127.0 lb

## 2013-05-19 DIAGNOSIS — D490 Neoplasm of unspecified behavior of digestive system: Secondary | ICD-10-CM | POA: Diagnosis not present

## 2013-05-19 DIAGNOSIS — Z1211 Encounter for screening for malignant neoplasm of colon: Secondary | ICD-10-CM

## 2013-05-19 DIAGNOSIS — R109 Unspecified abdominal pain: Secondary | ICD-10-CM | POA: Diagnosis not present

## 2013-05-19 NOTE — Assessment & Plan Note (Signed)
She has a stable cystic neoplasm of the pancreas.

## 2013-05-19 NOTE — Assessment & Plan Note (Signed)
Abdominal and back pain may be multifactorial.  I am not convinced that pain is primarily from the pancreatic neoplasm.  I would not recommend surgical resection at this time.  Recommendations #1 reevaluate for pain once she has completed a set of epidural injections.  If she still has pain I will refer her to a pain clinic.

## 2013-05-19 NOTE — Progress Notes (Signed)
_                                                                                                                History of Present Illness: The patient has returned for annual assessment of her benign pancreatic cystic neoplasm.  She has left flank and left upper quadrant pain laterally which is fairly constant and low-grade.  She takes narcotics intermittently.  She has a fair amount of radicular pain emanating from the lower back as well.  She recently received an epidural.  Followup CT scan, which I reviewed, demonstrated very slight enlargement of the cystic neoplasm.  She denies nausea, vomiting or early satiety.  Weight has been stable.    Past Medical History  Diagnosis Date  . Renal failure   . Diverticulitis of large intestine with perforation Hx colectomy  . Hypertension   . Barrett's esophagus   . Hyperlipemia   . Esophageal stricture   . CVA (cerebral infarction)     Mini Strokes   . Pancreatic cyst   . Gastroparesis   . Depression   . Heart failure, diastolic, chronic   . Persistent headaches   . Rapid heart rate     some times  . GERD (gastroesophageal reflux disease)   . Forgetfulness   . Irritable   . Sleep difficulties   . Bone pain   . Osteoporosis   . Fatigue   . Disturbed concentration   . Itch     skin  . Nausea     little vomitting  . TIA (transient ischemic attack)   . Macular degeneration   . Keratosis   . Anxiety   . Panic attacks   . Bruises easily   . Chills   . Generalized headaches    Past Surgical History  Procedure Laterality Date  . Kidney transplant  1984  . Appendectomy    . Colon resection  2010    Colostomy  . Colostomy  2011    Reversal   . Colostomy reversal    . Left arm surgery      due to injury  . Cataract surgery bilateral    . Esophageal dilation    . Parathyroidectomy  11/04/2010    left inferior parathyroid   family history includes Aneurysm in her brother; Cancer in her sister; Colon cancer  (age of onset: 87) in her maternal aunt; Heart disease in her mother; Lung cancer in her sister; Stroke in her father. Current Outpatient Prescriptions  Medication Sig Dispense Refill  . ammonium lactate (LAC-HYDRIN) 12 % lotion       . atorvastatin (LIPITOR) 20 MG tablet Take 20 mg by mouth daily.       Marland Kitchen azaTHIOprine (IMURAN) 50 MG tablet Take 50 mg by mouth daily.       . cycloSPORINE (RESTASIS) 0.05 % ophthalmic emulsion Place 1 drop into both eyes 2 (two) times daily.        . diazepam (VALIUM) 2 MG tablet Take 2 mg by  mouth daily.      Marland Kitchen FLUoxetine (PROZAC) 40 MG capsule Take 40 mg by mouth daily.        Marland Kitchen HYDROcodone-acetaminophen (NORCO) 10-325 MG per tablet Take 1 tablet by mouth every 6 (six) hours as needed. For pain      . metoprolol (LOPRESSOR) 50 MG tablet Take 50 mg by mouth 2 (two) times daily. Takes one half tablet twice daily      . omeprazole (PRILOSEC) 20 MG capsule Take 20 mg by mouth 2 (two) times daily.       . predniSONE (DELTASONE) 5 MG tablet Take 7.5 mg by mouth daily. One and 1/2 daily      . QUEtiapine (SEROQUEL) 100 MG tablet Take 150 mg by mouth at bedtime. Takes 1 and 1/2 tablet      . trimethoprim (TRIMPEX) 100 MG tablet        No current facility-administered medications for this visit.   Allergies as of 05/19/2013  . (No Known Allergies)    reports that she quit smoking about 23 years ago. She has never used smokeless tobacco. She reports that she does not drink alcohol or use illicit drugs.     Review of Systems: Pertinent positive and negative review of systems were noted in the above HPI section. All other review of systems were otherwise negative.  Vital signs were reviewed in today's medical record Physical Exam: General: Well developed , well nourished, no acute distress Skin: anicteric Head: Normocephalic and atraumatic Eyes:  sclerae anicteric, EOMI Ears: Normal auditory acuity Mouth: No deformity or lesions Neck: Supple, no masses or  thyromegaly Lungs: Clear throughout to auscultation Heart: Regular rate and rhythm; no murmurs, rubs or bruits Abdomen: Soft, non tender and non distended. No masses, hepatosplenomegaly or hernias noted. Normal Bowel sounds Rectal:deferred Musculoskeletal: Symmetrical with no gross deformities  Skin: No lesions on visible extremities Pulses:  Normal pulses noted Extremities: No clubbing, cyanosis, edema or deformities noted Neurological: Alert oriented x 4, grossly nonfocal Cervical Nodes:  No significant cervical adenopathy Inguinal Nodes: No significant inguinal adenopathy Psychological:  Alert and cooperative. Normal mood and affect  See Assessment and Plan under Problem List

## 2013-05-19 NOTE — Assessment & Plan Note (Signed)
Patient is due for follow-up colonoscopy 

## 2013-05-19 NOTE — Patient Instructions (Signed)
Keep your scheduled appointment for your Colonoscopy

## 2013-05-27 DIAGNOSIS — F330 Major depressive disorder, recurrent, mild: Secondary | ICD-10-CM | POA: Diagnosis not present

## 2013-05-29 ENCOUNTER — Ambulatory Visit (AMBULATORY_SURGERY_CENTER): Payer: Medicare Other | Admitting: Gastroenterology

## 2013-05-29 ENCOUNTER — Encounter: Payer: Self-pay | Admitting: Gastroenterology

## 2013-05-29 VITALS — BP 138/58 | HR 53 | Temp 98.0°F | Resp 19 | Ht 65.0 in | Wt 128.0 lb

## 2013-05-29 DIAGNOSIS — Z1211 Encounter for screening for malignant neoplasm of colon: Secondary | ICD-10-CM

## 2013-05-29 DIAGNOSIS — I2510 Atherosclerotic heart disease of native coronary artery without angina pectoris: Secondary | ICD-10-CM | POA: Diagnosis not present

## 2013-05-29 MED ORDER — SODIUM CHLORIDE 0.9 % IV SOLN: 500.0000 mL | INTRAVENOUS | Status: DC

## 2013-05-29 NOTE — Patient Instructions (Signed)
YOU HAD AN ENDOSCOPIC PROCEDURE TODAY AT THE [facility]: Refer to the procedure report that was given to you for any specific questions about what was found during the examination.  If the procedure report does not answer your questions, please call your gastroenterologist to clarify.  If you requested that your care partner not be given the details of your procedure findings, then the procedure report has been included in a sealed envelope for you to review at your convenience later.  YOU SHOULD EXPECT: Some feelings of bloating in the abdomen. Passage of more gas than usual.  Walking can help get rid of the air that was put into your GI tract during the procedure and reduce the bloating. If you had a lower endoscopy (such as a colonoscopy or flexible sigmoidoscopy) you may notice spotting of blood in your stool or on the toilet paper. If you underwent a bowel prep for your procedure, then you may not have a normal bowel movement for a few days.  DIET: Your first meal following the procedure should be a light meal and then it is ok to progress to your normal diet.  A half-sandwich or bowl of soup is an example of a good first meal.  Heavy or fried foods are harder to digest and may make you feel nauseous or bloated.  Likewise meals heavy in dairy and vegetables can cause extra gas to form and this can also increase the bloating.  Drink plenty of fluids but you should avoid alcoholic beverages for 24 hours.  ACTIVITY: Your care partner should take you home directly after the procedure.  You should plan to take it easy, moving slowly for the rest of the day.  You can resume normal activity the day after the procedure however you should NOT DRIVE or use heavy machinery for 24 hours (because of the sedation medicines used during the test).    SYMPTOMS TO REPORT IMMEDIATELY: A gastroenterologist can be reached at any hour.  During normal business hours, 8:30 AM to 5:00 PM Monday through Friday,  call [phone].  After hours and on weekends, please call the GI answering service at [phone] who will take a message and have the physician on call contact you.   Following lower endoscopy (colonoscopy or flexible sigmoidoscopy):  Excessive amounts of blood in the stool  Significant tenderness or worsening of abdominal pains  Swelling of the abdomen that is new, acute  Fever of 100F or higher  FOLLOW UP: If any biopsies were taken you will be contacted by phone or by letter within the next 1-3 weeks.  Call your gastroenterologist if you have not heard about the biopsies in 3 weeks.  Our staff will call the home number listed on your records the next business day following your procedure to check on you and address any questions or concerns that you may have at that time regarding the information given to you following your procedure. This is a courtesy call and so if there is no answer at the home number and we have not heard from you through the emergency physician on call, we will assume that you have returned to your regular daily activities without incident.  SIGNATURES/CONFIDENTIALITY: You and/or your care partner have signed paperwork which will be entered into your electronic medical record.  These signatures attest to the fact that that the information above on your After Visit Summary has been reviewed and is understood.  Full responsibility of the confidentiality of this   discharge information lies with you and/or your care-partner.  Resume medications. 

## 2013-05-29 NOTE — Progress Notes (Signed)
A/ox3 pleased with MAC, report to Sheila RN 

## 2013-05-29 NOTE — Op Note (Addendum)
[facility]  [employer]. [place], [address]   FLEXIBLE SIGMOIDOSCOPY PROCEDURE REPORT  PATIENT: Morgan Cooper, Morgan Cooper.  MR#: [id number] BIRTHDATE: 1945/12/16 , 67  yrs. old GENDER: Female ENDOSCOPIST: Inda Castle, MD REFERRED BY: Myrtis Ser, M.D. PROCEDURE DATE:  05/29/2013 PROCEDURE:   Sigmoidoscopy, diagnostic ASA CLASS:   Class II INDICATIONS:average risk patient for colon cancer. history of perforated diverticulitis, status post colonic resection MEDICATIONS: MAC sedation, administered by CRNA and propofol (Diprivan) 100mg  IV  DESCRIPTION OF PROCEDURE:   After the risks benefits and alternatives of the procedure were thoroughly explained, informed consent was obtained.     The     endoscope was introduced through the anus  and advanced to the surgical anastomosis which was approximately a 5 cm from the anus , limited by No adverse events experienced.   The quality of the prep was excellent .  The instrument was then slowly withdrawn as the mucosa was fully examined.         COLON FINDINGS: The colonic mucosa appeared normal. Retroflexed views revealed no abnormalities.    The scope was then withdrawn from the patient and the procedure terminated.  COMPLICATIONS: There were no complications.  ENDOSCOPIC IMPRESSION: The colonic mucosa appeared normal  RECOMMENDATIONS: sigmoidoscopy for colorectal cancer screening in 10 years  REPEAT EXAM:   _______________________________ Lorrin MaisInda Castle, MD 05/29/2013 10:08 AM Revised: 05/29/2013 10:08 AM  CC:

## 2013-05-30 ENCOUNTER — Telehealth: Payer: Self-pay | Admitting: *Deleted

## 2013-05-30 NOTE — Telephone Encounter (Signed)
No answer, left message to call if questions or concerns. 

## 2013-06-11 ENCOUNTER — Telehealth: Payer: Self-pay | Admitting: Interventional Cardiology

## 2013-06-11 NOTE — Telephone Encounter (Signed)
New Message  Pt called, states that she is experiencing SOB and pain within the left side of her chest// Request a call back to discuss.

## 2013-06-11 NOTE — Telephone Encounter (Signed)
Spoke with pt who reports she has been experiencing daily chest pain for a couple of months. Describes as "mild."  Burning type feeling that occurs off and on during day. Lasts about 3-4 minutes and goes away on own- usually with rest. Has not taken anything for this pain. Pain is in center of chest and goes to left breast area. Last episode of pain was yesterday afternoon.  No pain at this time.  Also complaining of shortness of breath with exertion. Pt reports this is not new for her and has been ongoing for several months. She reports she has seen primary care and was told pain may be related to her breast.  Appt made for pt to see Richardson Dopp, PA on June 12, 2013.

## 2013-06-12 ENCOUNTER — Ambulatory Visit (INDEPENDENT_AMBULATORY_CARE_PROVIDER_SITE_OTHER): Payer: Medicare Other | Admitting: Physician Assistant

## 2013-06-12 ENCOUNTER — Encounter: Payer: Self-pay | Admitting: Physician Assistant

## 2013-06-12 VITALS — BP 146/79 | HR 58 | Ht 65.0 in | Wt 126.0 lb

## 2013-06-12 DIAGNOSIS — I10 Essential (primary) hypertension: Secondary | ICD-10-CM | POA: Diagnosis not present

## 2013-06-12 DIAGNOSIS — R5381 Other malaise: Secondary | ICD-10-CM

## 2013-06-12 DIAGNOSIS — K219 Gastro-esophageal reflux disease without esophagitis: Secondary | ICD-10-CM

## 2013-06-12 DIAGNOSIS — R5383 Other fatigue: Secondary | ICD-10-CM | POA: Diagnosis not present

## 2013-06-12 DIAGNOSIS — E785 Hyperlipidemia, unspecified: Secondary | ICD-10-CM | POA: Diagnosis not present

## 2013-06-12 DIAGNOSIS — R079 Chest pain, unspecified: Secondary | ICD-10-CM

## 2013-06-12 DIAGNOSIS — I5032 Chronic diastolic (congestive) heart failure: Secondary | ICD-10-CM | POA: Insufficient documentation

## 2013-06-12 DIAGNOSIS — Z940 Kidney transplant status: Secondary | ICD-10-CM

## 2013-06-12 DIAGNOSIS — R0602 Shortness of breath: Secondary | ICD-10-CM | POA: Diagnosis not present

## 2013-06-12 LAB — BASIC METABOLIC PANEL
BUN: 12 mg/dL (ref 6–23)
CALCIUM: 9.5 mg/dL (ref 8.4–10.5)
CO2: 27 meq/L (ref 19–32)
Chloride: 104 mEq/L (ref 96–112)
Creatinine, Ser: 0.9 mg/dL (ref 0.4–1.2)
GFR: 66.01 mL/min (ref >60.00)
GLUCOSE: 103 mg/dL — AB (ref 70–99)
Potassium: 3.8 mEq/L (ref 3.5–5.1)
SODIUM: 138 meq/L (ref 135–145)

## 2013-06-12 LAB — TSH: TSH: 1.9 u[IU]/mL (ref 0.35–5.50)

## 2013-06-12 LAB — BRAIN NATRIURETIC PEPTIDE: Pro B Natriuretic peptide (BNP): 283 pg/mL — ABNORMAL HIGH (ref 0.0–100.0)

## 2013-06-12 MED ORDER — FUROSEMIDE 40 MG PO TABS: ORAL_TABLET | ORAL | Status: DC

## 2013-06-12 NOTE — Patient Instructions (Signed)
TAKE LASIX 20 MG ON MON, WED, AND FRI'S  LAB WORK TODAY; BMET, BNP, TSH  REPEAT BMET IN 2 WEEKS  Your physician has requested that you have a lexiscan myoview. For further information please visit [email or web address]. Please follow instruction sheet, as given.   FOLLOW UP WITH DR. VARANASI IN 07/2013

## 2013-06-12 NOTE — Progress Notes (Signed)
[place], [place] [place], [place]  [address] Phone: [phone] Fax:  [phone]  Date:  06/12/2013   ID:  Morgan Cooper, DOB March 20, 1945, MRN [id number]  PCP:  Cloyd Stagers, MD  Cardiologist:  Dr. Casandra Doffing   Nephrology:  Dr. Justin Mend   History of Present Illness: Morgan Cooper is a 68 y.o. female with a hx of GN s/p Renal Tx in 6962, diastolic CHF, HTN, prior stroke.  Last seen by Dr. Casandra Doffing in 06/2012.    She presents today for evaluation of chest discomfort. She has a lot of vague complaints. She notes dyspnea and chest discomfort for over 2 months now. It seems to have gotten worse. It comes and goes. Is a left-sided stinging or burning pain. It may last 2 minutes or less. She denies pleuritic chest pain. She denies chest pain with lying supine. She denies any radiating symptoms. She thinks she may be nauseated at times. She also thinks she may be diaphoretic. She does note increased dyspnea. She probably describes NYHA class IIb symptoms, however, this is not clear. She has chest discomfort at rest as well as with activity. She can perform activities without chest pain as well. She denies syncope but has noted some lightheadedness.   Studies:  - LHC (09/1991):  Normal coronary arteries, normal LV function  - Echo (04/2009):  EF 76.2%, normal wall motion, mild LAE, grade 2 diastolic dysfunction, mild TR  - Nuclear (12/2009):  No ischemia, EF 93%, normal study  - ABI (02/2010): Normal   Recent Labs: 04/28/2013: ALT 14; Creatinine 1.1; Potassium 4.6 03/03/2013:  Creatinine 0.84, potassium 4.2, Hgb 12.3 12/03/2012: LDL 63 06/25/2012: BNP 238   Wt Readings from Last 3 Encounters:  06/12/13 126 lb (57.153 kg)  05/29/13 128 lb (58.06 kg)  05/19/13 127 lb (57.607 kg)     Past Medical History  Diagnosis Date  . Renal failure   . Diverticulitis of large intestine with perforation Hx colectomy  . Hypertension   . Barrett's esophagus   . Hyperlipemia   .  Esophageal stricture   . CVA (cerebral infarction)     Mini Strokes   . Pancreatic cyst   . Gastroparesis   . Depression   . Heart failure, diastolic, chronic   . Persistent headaches   . Rapid heart rate     some times  . GERD (gastroesophageal reflux disease)   . Forgetfulness   . Irritable   . Sleep difficulties   . Bone pain   . Osteoporosis   . Fatigue   . Disturbed concentration   . Itch     skin  . Nausea     little vomitting  . TIA (transient ischemic attack)   . Macular degeneration   . Keratosis   . Anxiety   . Panic attacks   . Bruises easily   . Chills   . Generalized headaches   . Stroke     Current Outpatient Prescriptions  Medication Sig Dispense Refill  . ammonium lactate (LAC-HYDRIN) 12 % lotion       . atorvastatin (LIPITOR) 20 MG tablet Take 20 mg by mouth daily.       Marland Kitchen azaTHIOprine (IMURAN) 50 MG tablet Take 50 mg by mouth daily.       . cycloSPORINE (RESTASIS) 0.05 % ophthalmic emulsion Place 1 drop into both eyes 2 (two) times daily.        . diazepam (VALIUM) 2 MG tablet Take  2 mg by mouth daily.      Marland Kitchen FLUoxetine (PROZAC) 40 MG capsule Take 40 mg by mouth daily.        Marland Kitchen HYDROcodone-acetaminophen (NORCO) 10-325 MG per tablet Take 1 tablet by mouth every 6 (six) hours as needed. For pain      . metoprolol (LOPRESSOR) 50 MG tablet Take 50 mg by mouth 2 (two) times daily. Takes one half tablet twice daily      . omeprazole (PRILOSEC) 20 MG capsule Take 20 mg by mouth 2 (two) times daily.       . predniSONE (DELTASONE) 5 MG tablet Take 7.5 mg by mouth daily. One and 1/2 daily      . QUEtiapine (SEROQUEL) 100 MG tablet Take 150 mg by mouth at bedtime. Takes 1 and 1/2 tablet       No current facility-administered medications for this visit.    Allergies:   Review of patient's allergies indicates no known allergies.   Social History:  The patient  reports that she quit smoking about 24 years ago. She has never used smokeless tobacco. She reports  that she does not drink alcohol or use illicit drugs.   Family History:  The patient's family history includes Aneurysm in her brother; Cancer in her sister; Colon cancer (age of onset: 31) in her maternal aunt; Heart disease in her mother; Lung cancer in her sister; Stroke in her father. There is no history of Esophageal cancer, Rectal cancer, or Stomach cancer.   ROS:  Please see the history of present illness.   She does have some dysphagia. She denies any associated pain with meals. She denies any bleeding problems. She has a lot of back pain   All other systems reviewed and negative.   PHYSICAL EXAM: VS:  BP 146/79  Pulse 58  Ht 5\' 5"  (1.651 m)  Wt 126 lb (57.153 kg)  BMI 20.97 kg/m2 Well nourished, well developed, in no acute distress HEENT: normal Neck: no JVD Cardiac:  normal S1, S2; RRR; no murmur Lungs:  clear to auscultation bilaterally, no wheezing, rhonchi or rales Abd: soft, nontender, no hepatomegaly Ext: no edema Skin: warm and dry Neuro:  CNs 2-12 intact, no focal abnormalities noted  EKG:  Sinus bradycardia, HR 58, normal axis, increased artifact, no significant change     ASSESSMENT AND PLAN:  1. Chest pain, unspecified: Etiology not clear. Symptoms are somewhat atypical. Arrange Lexiscan Myoview for risk stratification. 2. Shortness of breath: Etiology unclear. She does have a history of diastolic CHF. She does tell me that she does feel better at times when she takes Lasix. Check a basic metabolic panel, BNP, TSH. I have asked her to try taking Lasix 20 mg 3 times a week. Check a followup basic metabolic panel in 2 weeks. 3. Chronic diastolic heart failure: As noted we will obtain a basic metabolic panel and BNP today. She has been asked to take Lasix 3 times a week. 4. Hypertension: Controlled. 5. HLD (hyperlipidemia): Continue statin. 6. GERD: Continue PPI. Followup with PCP if stress test is low risk. 7. History of renal transplant: Followup with nephrology as  planned. 8. Fatigue: Check TSH  9. Disposition: Followup with Dr. Casandra Doffing in 07/2013 as planned.   Signed, Richardson Dopp, PA-C  06/12/2013 2:31 PM

## 2013-06-13 ENCOUNTER — Telehealth: Payer: Self-pay | Admitting: *Deleted

## 2013-06-13 NOTE — Telephone Encounter (Signed)
s/w pt about her lab results with verbal understanding. Pt then said she needs to rsc her myoview and I said I will have scheduling cb to rsc. pt said ok and thank you.

## 2013-06-24 ENCOUNTER — Ambulatory Visit: Payer: Medicare Other | Admitting: Interventional Cardiology

## 2013-06-25 ENCOUNTER — Encounter (HOSPITAL_COMMUNITY): Payer: Medicare Other

## 2013-06-25 DIAGNOSIS — M545 Low back pain, unspecified: Secondary | ICD-10-CM | POA: Diagnosis not present

## 2013-07-01 ENCOUNTER — Ambulatory Visit (HOSPITAL_COMMUNITY): Payer: Medicare Other | Attending: Cardiovascular Disease | Admitting: Radiology

## 2013-07-01 VITALS — BP 139/97 | HR 52 | Ht 65.0 in | Wt 126.0 lb

## 2013-07-01 DIAGNOSIS — R5383 Other fatigue: Secondary | ICD-10-CM | POA: Diagnosis not present

## 2013-07-01 DIAGNOSIS — R5381 Other malaise: Secondary | ICD-10-CM | POA: Diagnosis not present

## 2013-07-01 DIAGNOSIS — R0602 Shortness of breath: Secondary | ICD-10-CM

## 2013-07-01 DIAGNOSIS — R079 Chest pain, unspecified: Secondary | ICD-10-CM | POA: Diagnosis not present

## 2013-07-01 MED ORDER — TECHNETIUM TC 99M SESTAMIBI GENERIC - CARDIOLITE
10.8000 | Freq: Once | INTRAVENOUS | Status: AC | PRN
  Administered 2013-07-01: 11 via INTRAVENOUS

## 2013-07-01 MED ORDER — TECHNETIUM TC 99M SESTAMIBI GENERIC - CARDIOLITE
33.0000 | Freq: Once | INTRAVENOUS | Status: AC | PRN
  Administered 2013-07-01: 33 via INTRAVENOUS

## 2013-07-01 MED ORDER — REGADENOSON 0.4 MG/5ML IV SOLN
0.4000 mg | Freq: Once | INTRAVENOUS | Status: AC
  Administered 2013-07-01: 0.4 mg via INTRAVENOUS

## 2013-07-01 NOTE — Progress Notes (Signed)
[facility] [address] [place], [place] [address] [phone]    Cardiology Nuclear Med Study  Morgan Cooper is a 68 y.o. female     MRN : [id number]     DOB: [birth year]  Procedure Date: 07/01/2013  Nuclear Med Background Indication for Stress Test:  Evaluation for Ischemia History:  no prior hx CAD; hx Cath-nml; 2011 Echo-E 76%; 2011 MPI-nml, EF 93% Cardiac Risk Factors: CVA, Family History - CAD, History of Smoking, Hypertension and Lipids  Symptoms:  Chest Pain, Dizziness and SOB   Nuclear Pre-Procedure Caffeine/Decaff Intake:  None NPO After: 9:00pm   Lungs:  clear O2 Sat: 97% on room air. IV 0.9% NS with Angio Cath:  22g  IV Site: R Wrist  IV Started by:  Annye Rusk, CNMT  Chest Size (in):  38 Cup Size: C  Height: 5\' 5"  (1.651 m)  Weight:  126 lb (57.153 kg)  BMI:  Body mass index is 20.97 kg/(m^2). Tech Comments:  Held all am meds    Nuclear Med Study 1 or 2 day study: 1 day  Stress Test Type:  Lexiscan  Reading MD: Candee Furbish, MD  Order Authorizing Provider:  Lendell Caprice, MD  Resting Radionuclide: Technetium 11m Sestamibi  Resting Radionuclide Dose: 11.0 mCi   Stress Radionuclide:  Technetium 68m Sestamibi  Stress Radionuclide Dose: 33.0 mCi           Stress Protocol Rest HR: 52 Stress HR: 82  Rest BP: 139/97 Stress BP: 128/79  Exercise Time (min): n/a METS: n/a           Dose of Adenosine (mg):  n/a Dose of Lexiscan: 0.4 mg  Dose of Atropine (mg): n/a Dose of Dobutamine: n/a mcg/kg/min (at max HR)  Stress Test Technologist: Glade Lloyd, BS-ES  Nuclear Technologist:  Charlton Amor, CNMT     Rest Procedure:  Myocardial perfusion imaging was performed at rest 45 minutes following the intravenous administration of Technetium 64m Sestamibi. Rest ECG: Sinus bradycardia rate 52 with no other abnormalities.  Stress Procedure:  The patient received IV Lexiscan 0.4 mg over 15-seconds.  Technetium 16m Sestamibi injected at  30-seconds.  Quantitative spect images were obtained after a 45 minute delay.  During the infusion of Lexiscan, the patient complained of SOB, lightheadedness, dizzy, chest heaviness and nausea.  These symptoms began to resolve in recovery.  Stress ECG: No significant change from baseline ECG  QPS Raw Data Images:  Normal; no motion artifact; normal heart/lung ratio. Stress Images:  Normal homogeneous uptake in all areas of the myocardium. Rest Images:  Normal homogeneous uptake in all areas of the myocardium. Subtraction (SDS):  No evidence of ischemia. Transient Ischemic Dilatation (Normal <1.22):  0.79 Lung/Heart Ratio (Normal <0.45):  0.30  Quantitative Gated Spect Images QGS EDV:  64 ml QGS ESV:  18 ml  Impression Exercise Capacity:  Lexiscan with no exercise. BP Response:  Normal blood pressure response. Clinical Symptoms:  Typical symptoms with Lexiscan. ECG Impression:  No significant ST segment change suggestive of ischemia. Comparison with Prior Nuclear Study: No images to compare  Overall Impression:  Low risk stress nuclear study with no ischemia identified..  LV Ejection Fraction: 72%.  LV Wall Motion:  NL LV Function; NL Wall Motion   Signed by Ines Bloomer on 07/01/2013 at 10:06 AM. Candee Furbish, MD

## 2013-07-02 ENCOUNTER — Encounter: Payer: Self-pay | Admitting: Physician Assistant

## 2013-07-02 ENCOUNTER — Telehealth: Payer: Self-pay | Admitting: *Deleted

## 2013-07-02 NOTE — Telephone Encounter (Signed)
lmptcb for myoview results 

## 2013-07-03 ENCOUNTER — Telehealth: Payer: Self-pay | Admitting: Interventional Cardiology

## 2013-07-03 NOTE — Telephone Encounter (Signed)
pt notified about normal myoview; pt verbalized understanding

## 2013-07-03 NOTE — Telephone Encounter (Signed)
New Message: ° °Pt is requesting a call back to hear her recent test results °

## 2013-07-21 ENCOUNTER — Ambulatory Visit (INDEPENDENT_AMBULATORY_CARE_PROVIDER_SITE_OTHER): Payer: Medicare Other | Admitting: Interventional Cardiology

## 2013-07-21 ENCOUNTER — Encounter: Payer: Self-pay | Admitting: Interventional Cardiology

## 2013-07-21 VITALS — BP 140/88 | HR 60 | Ht 65.0 in | Wt 126.0 lb

## 2013-07-21 DIAGNOSIS — R002 Palpitations: Secondary | ICD-10-CM | POA: Diagnosis not present

## 2013-07-21 DIAGNOSIS — I5032 Chronic diastolic (congestive) heart failure: Secondary | ICD-10-CM

## 2013-07-21 DIAGNOSIS — Z79899 Other long term (current) drug therapy: Secondary | ICD-10-CM

## 2013-07-21 DIAGNOSIS — R079 Chest pain, unspecified: Secondary | ICD-10-CM

## 2013-07-21 DIAGNOSIS — R0602 Shortness of breath: Secondary | ICD-10-CM

## 2013-07-21 MED ORDER — FUROSEMIDE 40 MG PO TABS: 20.0000 mg | ORAL_TABLET | Freq: Every day | ORAL | Status: DC

## 2013-07-21 NOTE — Patient Instructions (Signed)
Your physician has recommended you make the following change in your medication:   1. Increase lasix to 20 mg daily.   Your physician has recommended that you wear an event monitor. Event monitors are medical devices that record the heart's electrical activity. Doctors most often [place] these monitors to diagnose arrhythmias. Arrhythmias are problems with the speed or rhythm of the heartbeat. The monitor is a small, portable device. You can wear one while you do your normal daily activities. This is usually used to diagnose what is causing palpitations/syncope (passing out).  Your physician recommends that you schedule a follow-up appointment in: 3 weeks with Richardson Dopp, PA.   Your physician wants you to follow-up in: 1 year with Dr. Irish Lack. You will receive a reminder letter in the mail two months in advance. If you don't receive a letter, please call our office to schedule the follow-up appointment.

## 2013-07-21 NOTE — Progress Notes (Signed)
Patient ID: Morgan Cooper, female   DOB: [birth year], 68 y.o.   MRN: [id number]    [place], [place], [place]  [address] Phone: [phone] Fax:  [phone]  Date:  07/21/2013   ID:  Morgan Cooper, DOB 10-18-1945, MRN [id number]  PCP:  Cloyd Stagers, MD      History of Present Illness: Morgan Cooper is a 68 y.o. female with prior diastolic dysfunction. She was seen a month ago for chest pain. She had a stress test that was negative for ischemia. She continues to have worsening shortness of breath, dyspnea on exertion and chest discomfort. The pain is on the left side of her chest, above her left breast and is not always related to exertion. It is a burning pain at times. There is no pain in the center of her chest. The pain in her chest does not move. There is no associated diaphoresis, nausea or vomiting. Overall, the patient just does not feel well. In the past, she was on higher doses of Lasix and she felt better. She did feel that the Lasix may be causing dizziness so she backed off this medicine. She has not had any syncope.  She also reports that she'll wake up in the morning and have a fast heart beat at times. She thinks it gets as high as 100-105. It just makes her feel uncomfortable in general. She is not confident to go out and walk on her own. She is somewhat nervous to be out by herself.   Wt Readings from Last 3 Encounters:  07/21/13 126 lb (57.153 kg)  07/01/13 126 lb (57.153 kg)  06/12/13 126 lb (57.153 kg)     Past Medical History  Diagnosis Date  . Renal failure   . Diverticulitis of large intestine with perforation Hx colectomy  . Hypertension   . Barrett's esophagus   . Hyperlipemia   . Esophageal stricture   . CVA (cerebral infarction)     Mini Strokes   . Pancreatic cyst   . Gastroparesis   . Depression   . Heart failure, diastolic, chronic   . Persistent headaches   . Rapid heart rate     some times  . GERD (gastroesophageal  reflux disease)   . Forgetfulness   . Irritable   . Sleep difficulties   . Bone pain   . Osteoporosis   . Fatigue   . Disturbed concentration   . Itch     skin  . Nausea     little vomitting  . TIA (transient ischemic attack)   . Macular degeneration   . Keratosis   . Anxiety   . Panic attacks   . Bruises easily   . Chills   . Generalized headaches   . Stroke   . Hx of cardiovascular stress test     Lexiscan Myoview (06/2013):  No ischemia, EF 72%; Low Risk    Current Outpatient Prescriptions  Medication Sig Dispense Refill  . ammonium lactate (LAC-HYDRIN) 12 % lotion as needed.       Marland Kitchen atorvastatin (LIPITOR) 20 MG tablet Take 20 mg by mouth daily.       Marland Kitchen azaTHIOprine (IMURAN) 50 MG tablet Take 50 mg by mouth daily.       . cycloSPORINE (RESTASIS) 0.05 % ophthalmic emulsion Place 1 drop into both eyes 2 (two) times daily.        . diazepam (VALIUM) 2 MG tablet Take 2 mg by  mouth every 6 (six) hours as needed.       Marland Kitchen FLUoxetine (PROZAC) 40 MG capsule Take 40 mg by mouth daily.        . furosemide (LASIX) 40 MG tablet 20 MG ON MON, WED, AND FRI'S      . HYDROcodone-acetaminophen (NORCO) 10-325 MG per tablet Take 1 tablet by mouth every 6 (six) hours as needed. For pain      . metoprolol (LOPRESSOR) 50 MG tablet Take 50 mg by mouth 2 (two) times daily. Takes one half tablet twice daily      . omeprazole (PRILOSEC) 20 MG capsule Take 20 mg by mouth 2 (two) times daily.       . predniSONE (DELTASONE) 5 MG tablet Take 7.5 mg by mouth daily. One and 1/2 daily      . QUEtiapine (SEROQUEL) 100 MG tablet 2 tabs po qhs       No current facility-administered medications for this visit.    Allergies:   No Known Allergies  Social History:  The patient  reports that she quit smoking about 24 years ago. She has never used smokeless tobacco. She reports that she does not drink alcohol or use illicit drugs.   Family History:  The patient's family history includes Aneurysm in her  brother; Cancer in her sister; Colon cancer (age of onset: 64) in her maternal aunt; Heart disease in her mother; Lung cancer in her sister; Stroke in her father. There is no history of Esophageal cancer, Rectal cancer, or Stomach cancer.   ROS:  Please see the history of present illness.  No nausea, vomiting.  No fevers, chills.  No focal weakness.  No dysuria.    All other systems reviewed and negative.   PHYSICAL EXAM: VS:  BP 140/88  Pulse 60  Ht 5\' 5"  (1.651 m)  Wt 126 lb (57.153 kg)  BMI 20.97 kg/m2 Well nourished, well developed, in no acute distress HEENT: normal Neck: no JVD, no carotid bruits Cardiac:  normal S1, S2; RRR;  Lungs:  clear to auscultation bilaterally, no wheezing, rhonchi or rales Abd: soft, nontender, no hepatomegaly Ext: no edema Skin: warm and dry Neuro:   no focal abnormalities noted      ASSESSMENT AND PLAN:  1. Chest pain:  Negative stress test in 07/02/13.  Atypical sx.  Not related to exertion.  Left sided pain above the left breast.  Hesitant to pursue cath due to renal transplant. 2. Palpitations: sx are worse than they were a year ago.  Plan for event monitor.  3. SHOB: BNP was elevated in 4/15.  SOme improvement with Lasix.  Will plan to increase Lasix 20 mg daily.  Check BMet in a week.  In the past, she had her chronic diastolic heart failure treated with Lasix 20 mg BID.  Sx were better, but she does not want to go back to that dose.  She felt that she may have had dizziness from this medicine. 4. She was previously on Verapamil.  This was stopped in the past year.   Heart rate already fairly slow. 5. Overall, it is unclear whether her symptoms are related to worsening diastolic dysfunction. We'll try to treat fluid overload. She is somewhat anxious and fidgety. There may be some noncardiac pathology. Again, given her renal transplant, I would like to avoid invasive testing if possible. Hyperlipidemia  Continue Lipitor Tablet, 20 MG, 1 tablet,  Orally, Once a day Notes: LDL 75 in 2013, Normal LFTs. In 2014:  TG 212, HDL 37, LDL 63;   Signed, Mina Marble, MD, [facility] 07/21/2013 9:06 AM

## 2013-07-29 ENCOUNTER — Other Ambulatory Visit (INDEPENDENT_AMBULATORY_CARE_PROVIDER_SITE_OTHER): Payer: Medicare Other

## 2013-07-29 ENCOUNTER — Encounter: Payer: Self-pay | Admitting: *Deleted

## 2013-07-29 ENCOUNTER — Encounter (INDEPENDENT_AMBULATORY_CARE_PROVIDER_SITE_OTHER): Payer: Medicare Other

## 2013-07-29 DIAGNOSIS — R0602 Shortness of breath: Secondary | ICD-10-CM

## 2013-07-29 DIAGNOSIS — Z79899 Other long term (current) drug therapy: Secondary | ICD-10-CM | POA: Diagnosis not present

## 2013-07-29 DIAGNOSIS — R002 Palpitations: Secondary | ICD-10-CM | POA: Diagnosis not present

## 2013-07-29 LAB — BASIC METABOLIC PANEL
BUN: 15 mg/dL (ref 6–23)
CHLORIDE: 103 meq/L (ref 96–112)
CO2: 26 meq/L (ref 19–32)
CREATININE: 0.9 mg/dL (ref 0.4–1.2)
Calcium: 9.5 mg/dL (ref 8.4–10.5)
GFR: 66.84 mL/min (ref >60.00)
Glucose, Bld: 85 mg/dL (ref 70–99)
Potassium: 3.9 mEq/L (ref 3.5–5.1)
Sodium: 137 mEq/L (ref 135–145)

## 2013-07-29 NOTE — Progress Notes (Signed)
Patient ID: Morgan Cooper, female   DOB: May 23, 1945, 68 y.o.   MRN: [id number] [employer] 30 day cardiac event monitor applied to patient.

## 2013-08-07 ENCOUNTER — Ambulatory Visit: Payer: Self-pay | Admitting: Neurology

## 2013-08-07 DIAGNOSIS — E785 Hyperlipidemia, unspecified: Secondary | ICD-10-CM | POA: Diagnosis not present

## 2013-08-07 DIAGNOSIS — N2581 Secondary hyperparathyroidism of renal origin: Secondary | ICD-10-CM | POA: Diagnosis not present

## 2013-08-07 DIAGNOSIS — N039 Chronic nephritic syndrome with unspecified morphologic changes: Secondary | ICD-10-CM | POA: Diagnosis not present

## 2013-08-07 DIAGNOSIS — N185 Chronic kidney disease, stage 5: Secondary | ICD-10-CM | POA: Diagnosis not present

## 2013-08-07 DIAGNOSIS — D631 Anemia in chronic kidney disease: Secondary | ICD-10-CM | POA: Diagnosis not present

## 2013-08-12 ENCOUNTER — Encounter: Payer: Self-pay | Admitting: *Deleted

## 2013-08-12 DIAGNOSIS — M545 Low back pain, unspecified: Secondary | ICD-10-CM | POA: Diagnosis not present

## 2013-08-12 DIAGNOSIS — G894 Chronic pain syndrome: Secondary | ICD-10-CM | POA: Diagnosis not present

## 2013-08-12 DIAGNOSIS — Z79899 Other long term (current) drug therapy: Secondary | ICD-10-CM | POA: Diagnosis not present

## 2013-08-19 DIAGNOSIS — F330 Major depressive disorder, recurrent, mild: Secondary | ICD-10-CM | POA: Diagnosis not present

## 2013-08-20 ENCOUNTER — Ambulatory Visit (INDEPENDENT_AMBULATORY_CARE_PROVIDER_SITE_OTHER): Payer: Medicare Other | Admitting: Physician Assistant

## 2013-08-20 ENCOUNTER — Ambulatory Visit
Admission: RE | Admit: 2013-08-20 | Discharge: 2013-08-20 | Disposition: A | Discharge status: Home / Self Care | Payer: Medicare Other | Source: Ambulatory Visit | Attending: Physician Assistant | Admitting: Physician Assistant

## 2013-08-20 ENCOUNTER — Encounter: Payer: Self-pay | Admitting: Physician Assistant

## 2013-08-20 ENCOUNTER — Telehealth: Payer: Self-pay | Admitting: *Deleted

## 2013-08-20 VITALS — BP 140/98 | HR 70 | Ht 65.0 in | Wt 128.0 lb

## 2013-08-20 DIAGNOSIS — R079 Chest pain, unspecified: Secondary | ICD-10-CM

## 2013-08-20 DIAGNOSIS — E785 Hyperlipidemia, unspecified: Secondary | ICD-10-CM

## 2013-08-20 DIAGNOSIS — I5032 Chronic diastolic (congestive) heart failure: Secondary | ICD-10-CM | POA: Diagnosis not present

## 2013-08-20 DIAGNOSIS — I10 Essential (primary) hypertension: Secondary | ICD-10-CM | POA: Diagnosis not present

## 2013-08-20 DIAGNOSIS — K219 Gastro-esophageal reflux disease without esophagitis: Secondary | ICD-10-CM

## 2013-08-20 DIAGNOSIS — R0602 Shortness of breath: Secondary | ICD-10-CM | POA: Diagnosis not present

## 2013-08-20 MED ORDER — ISOSORBIDE MONONITRATE ER 30 MG PO TB24: 15.0000 mg | ORAL_TABLET | Freq: Every day | ORAL | Status: DC

## 2013-08-20 NOTE — Patient Instructions (Addendum)
START IMDUR 30 MG TABLET; YOU WILL TAKE 1/2 TABLET DAILY = 15 MG DAILY; RX WAS SENT IN TODAY  A chest x-ray takes a picture of the organs and structures inside the chest, including the heart, lungs, and blood vessels. This test can show several things, including, whether the heart is enlarges; whether fluid is building up in the lungs; and whether pacemaker / defibrillator leads are still in place.  YOU WILL NEED TO HAVE YOUR BLOOD PRESSURE MACHINE CHECKED WITH THE NURSE; 08/29/13 @ 10 AM  Your physician recommends that you schedule a follow-up appointment in: 3 MONTHS WITH DR. VARANASI, WE WILL SEND OUT A REMINDER LETTER ASKING FOR YOU TO MAKE AN APPT.

## 2013-08-20 NOTE — Telephone Encounter (Signed)
pt notified about cxr results with verbal understanding. Pt advised to f/u w/PCP to discuss cxr results.

## 2013-08-20 NOTE — Progress Notes (Signed)
Cardiology Office Note   Date:  08/20/2013   ID:  Morgan Cooper, DOB 02-13-1946, MRN [id number]  PCP:  Cloyd Stagers, MD  Cardiologist:  Dr. Casandra Doffing   Nephrology:  Dr. Justin Mend   History of Present Illness: Morgan Cooper is a 68 y.o. female with a hx of GN s/p Renal Tx in 7654, diastolic CHF, HTN, prior stroke, pancreatic cystic neoplasm (followed by Dr. Deatra Ina).   Last seen by Dr. Casandra Doffing 07/21/13 in follow up.  Recent stress test was normal.  She continued to have chest pain and DOE.  She also complained of palpitations.  Event monitor was arranged.  Lasix was increased to 20 mg QD.  She returns for follow up.    She continues to feel about the same.  She often holds her Metoprolol and Lasix if her BP is low.  She held her meds and her BP is high here today.  She does feel like her breathing is better on days she takes lasix.  She continues to note DOE.  She is NYHA 2b.  She notes a "nagging" L sided chest pain.  She notes it with activity.  No assoc dyspnea.  She thinks she may be nauseated and diaphoretic.  She sleeps on an incline chronically.  She denies PND or edema.  Denies syncope.  She denies chest pain with meals.  She does note a hx of H2O brash.  Denies belching.  She does have a hx of esophageal dilatation.  No recent hx of dysphagia.     Studies:  - LHC (09/1991):  Normal coronary arteries, normal LV function  - Echo (04/2009):  EF 76.2%, normal wall motion, mild LAE, grade 2 diastolic dysfunction, mild TR  - Nuclear (12/2009):  No ischemia, EF 93%, normal study  - Nuclear (06/2013):  No ischemia, EF 72%; Low Risk  - ABI (02/2010): Normal   Recent Labs: 04/28/2013: ALT 14  06/12/2013: Pro B Natriuretic peptide (BNP) 283.0*; TSH 1.90  07/29/2013: Creatinine 0.9; Potassium 3.9 03/03/2013:  Creatinine 0.84, potassium 4.2, Hgb 12.3 12/03/2012: LDL 63 06/25/2012: BNP 238   Wt Readings from Last 3 Encounters:  08/20/13 128 lb (58.06 kg)  07/21/13 126 lb (57.153 kg)   07/01/13 126 lb (57.153 kg)     Past Medical History  Diagnosis Date  . Renal failure   . Diverticulitis of large intestine with perforation Hx colectomy  . Hypertension   . Barrett's esophagus   . Hyperlipemia   . Esophageal stricture   . CVA (cerebral infarction)     Mini Strokes   . Pancreatic cyst   . Gastroparesis   . Depression   . Heart failure, diastolic, chronic   . Persistent headaches   . Rapid heart rate     some times  . GERD (gastroesophageal reflux disease)   . Forgetfulness   . Irritable   . Sleep difficulties   . Bone pain   . Osteoporosis   . Fatigue   . Disturbed concentration   . Itch     skin  . Nausea     little vomitting  . TIA (transient ischemic attack)   . Macular degeneration   . Keratosis   . Anxiety   . Panic attacks   . Bruises easily   . Chills   . Generalized headaches   . Stroke   . Hx of cardiovascular stress test     Lexiscan Myoview (06/2013):  No ischemia, EF 72%; Low Risk  Current Outpatient Prescriptions  Medication Sig Dispense Refill  . ammonium lactate (LAC-HYDRIN) 12 % lotion as needed.       Marland Kitchen atorvastatin (LIPITOR) 20 MG tablet Take 20 mg by mouth daily.       Marland Kitchen azaTHIOprine (IMURAN) 50 MG tablet Take 50 mg by mouth daily.       . cycloSPORINE (RESTASIS) 0.05 % ophthalmic emulsion Place 1 drop into both eyes 2 (two) times daily.        . diazepam (VALIUM) 2 MG tablet Take 2 mg by mouth every 6 (six) hours as needed.       Marland Kitchen FLUoxetine (PROZAC) 40 MG capsule Take 40 mg by mouth daily.        . furosemide (LASIX) 40 MG tablet Take 0.5 tablets (20 mg total) by mouth daily.  30 tablet  6  . HYDROcodone-acetaminophen (NORCO) 10-325 MG per tablet Take 1 tablet by mouth every 6 (six) hours as needed. For pain      . metoprolol (LOPRESSOR) 50 MG tablet Take 50 mg by mouth 2 (two) times daily. Takes one half tablet twice daily      . omeprazole (PRILOSEC) 20 MG capsule Take 20 mg by mouth 2 (two) times daily.       .  predniSONE (DELTASONE) 5 MG tablet Take 7.5 mg by mouth daily. One and 1/2 daily      . QUEtiapine (SEROQUEL) 100 MG tablet 2 tabs po qhs      . trimethoprim (TRIMPEX) 100 MG tablet Bed-time       No current facility-administered medications for this visit.    Allergies:   Review of patient's allergies indicates no known allergies.   Social History:  The patient  reports that she quit smoking about 24 years ago. She has never used smokeless tobacco. She reports that she does not drink alcohol or use illicit drugs.   Family History:  The patient's family history includes Aneurysm in her brother; Cancer in her sister; Colon cancer (age of onset: 38) in her maternal aunt; Heart disease in her mother; Lung cancer in her sister; Stroke in her father. There is no history of Esophageal cancer, Rectal cancer, or Stomach cancer.   ROS:  Please see the history of present illness.     All other systems reviewed and negative.   PHYSICAL EXAM: VS:  BP 140/98  Pulse 70  Ht 5\' 5"  (1.651 m)  Wt 128 lb (58.06 kg)  BMI 21.30 kg/m2 Well nourished, well developed, in no acute distress HEENT: normal Neck: no JVD Vascular:  No carotid bruits Cardiac:  normal S1, S2; RRR; no murmur Lungs:  clear to auscultation bilaterally, no wheezing, rhonchi or rales Abd: soft, nontender, no hepatomegaly Ext: no edema Skin: warm and dry Neuro:  CNs 2-12 intact, no focal abnormalities noted  EKG:   NSR, HR 70, normal axis, no ST changes   ASSESSMENT AND PLAN:  1. Chest Pain:  Symptoms are atypical.  Recent Myoview low risk.  She has a hx of normal cardiac cath many years ago.  I am not convinced that her symptoms are cardiac.  She would be at higher risk for CIN with cardiac cath given hx of renal transplant.  We discussed a trial of medical Rx to see if this helps. I will try her on Imdur 15 mg QD.  I will arrange a CXR (prior smoker).  I have asked her to keep an eye on her BP and to get her  machine checked with a  Hg cuff.  Follow up with PCP and GI.  Continue PPI. 2. Chronic diastolic heart failure:  Volume overall appears stable.  She does feel better on days she takes the Lasix.  I have asked her to continue her current dose of Lasix.   3. Hypertension:  Elevated today.  She has not taken any medications.  Continue to monitor BP at home. 4. HLD (hyperlipidemia):  Continue statin. 5. GERD:  Continue PPI.  6. History of renal transplant:  Followup with nephrology as planned. 7. Disposition: Followup with Dr. Casandra Doffing in 3 mos.   Signed, Versie Starks, MHS 08/20/2013 10:01 AM    [facility] [address], [place], [place]  [address] Phone: [phone]; Fax: [phone]

## 2013-08-20 NOTE — Telephone Encounter (Signed)
lmptcb for cxr results

## 2013-08-26 ENCOUNTER — Telehealth: Payer: Self-pay | Admitting: *Deleted

## 2013-08-26 ENCOUNTER — Ambulatory Visit (INDEPENDENT_AMBULATORY_CARE_PROVIDER_SITE_OTHER): Payer: Medicare Other | Admitting: Nurse Practitioner

## 2013-08-26 ENCOUNTER — Encounter: Payer: Self-pay | Admitting: Nurse Practitioner

## 2013-08-26 VITALS — BP 135/76 | HR 66 | Ht 65.0 in | Wt 126.8 lb

## 2013-08-26 DIAGNOSIS — G459 Transient cerebral ischemic attack, unspecified: Secondary | ICD-10-CM

## 2013-08-26 DIAGNOSIS — G501 Atypical facial pain: Secondary | ICD-10-CM

## 2013-08-26 DIAGNOSIS — R51 Headache: Secondary | ICD-10-CM | POA: Diagnosis not present

## 2013-08-26 NOTE — Telephone Encounter (Signed)
Called patient to inform her of NP CM message that her last carotid doppler was in 2012 and that she has placed an order to have this test repeated, informed patient to expect a call from Collie Siad to schedule her appointment for the doppler and to call the office back with any questions or concerns.

## 2013-08-26 NOTE — Progress Notes (Signed)
[facility]  PATIENT: Morgan Cooper DOB: 21-May-1945   REASON FOR VISIT: follow up for atypical facial pain and history of silent  bicerebral infarcts    HISTORY OF PRESENT ILLNESS:Morgan Cooper , 68 -year-old white female returns for followup. She was last seen 10/24/12. 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 Remote history of bilateral small silent cerebellar infarcts. Vascular risk factors of HTN, Hyperlipidimia, and silent cerebral infarcts.  She reports improvement in her sharp shooting hemicranial neuralgic heeadaches which are not as frequent. She was placed on gabapentin low-dose at her last visit. She has stopped taking that because her facial pain is so infrequent. She had side effects to Topamax in the past. She says Topamax  made her feel funny She denies any episodes of loss of vision, jaw claudication or temporal tenderness. Lab work done for ESR, complement levels were normal. MRI scan of the brain done on 03/04/12 showed changes of chronic microvascular ischemia without any acute abnormality seen. She is currently wearing a Holter monitor . Last carotid doppler was 2012. She returns for reevaluation   REVIEW OF SYSTEMS: Full 14 system review of systems performed and notable only for those listed, all others are neg:  Constitutional: Fatigue Cardiovascular: Chest pain Ear/Nose/Throat: Ringing in the ears Skin: N/A  Eyes: Blurred vision Respiratory: N/A  Gastroitestinal: N/A  Hematology/Lymphatic: N/A  Endocrine: Flushing Musculoskeletal: Joint pain, neck stiffness Allergy/Immunology: N/A  Neurological: Intermittent facial pain  Psychiatric: Depression anxiety  Sleep : NA   ALLERGIES: No Known Allergies  HOME MEDICATIONS: Outpatient Prescriptions Prior to Visit  Medication Sig Dispense Refill  . ammonium lactate  (LAC-HYDRIN) 12 % lotion as needed.       Marland Kitchen atorvastatin (LIPITOR) 20 MG tablet Take 20 mg by mouth daily.       Marland Kitchen azaTHIOprine (IMURAN) 50 MG tablet Take 50 mg by mouth daily.       . cycloSPORINE (RESTASIS) 0.05 % ophthalmic emulsion Place 1 drop into both eyes 2 (two) times daily.        . diazepam (VALIUM) 2 MG tablet Take 2 mg by mouth every 6 (six) hours as needed.       Marland Kitchen FLUoxetine (PROZAC) 40 MG capsule Take 40 mg by mouth daily.        Marland Kitchen HYDROcodone-acetaminophen (NORCO) 10-325 MG per tablet Take 1 tablet by mouth every 6 (six) hours as needed. For pain      . isosorbide mononitrate (IMDUR) 30 MG 24 hr tablet Take 0.5 tablets (15 mg total) by mouth daily.  30 tablet  11  . metoprolol (LOPRESSOR) 50 MG tablet Take 50 mg by mouth 2 (two) times daily. Takes one half tablet twice daily      . omeprazole (PRILOSEC) 20 MG capsule Take 20 mg by mouth 2 (two) times daily.       . predniSONE (DELTASONE) 5 MG tablet Take 7.5 mg by mouth daily. One and 1/2 daily      . QUEtiapine (SEROQUEL) 100 MG tablet 2 tabs po qhs      . trimethoprim (TRIMPEX) 100 MG tablet Bed-time      . furosemide (LASIX) 40 MG tablet Take 0.5 tablets (20 mg total) by mouth daily.  30 tablet  6   No facility-administered medications prior to visit.    PAST MEDICAL HISTORY: Past Medical History  Diagnosis Date  .  Renal failure   . Diverticulitis of large intestine with perforation Hx colectomy  . Hypertension   . Barrett's esophagus   . Hyperlipemia   . Esophageal stricture   . CVA (cerebral infarction)     Mini Strokes   . Pancreatic cyst   . Gastroparesis   . Depression   . Heart failure, diastolic, chronic   . Persistent headaches   . Rapid heart rate     some times  . GERD (gastroesophageal reflux disease)   . Forgetfulness   . Irritable   . Sleep difficulties   . Bone pain   . Osteoporosis   . Fatigue   . Disturbed concentration   . Itch     skin  . Nausea     little vomitting  . TIA  (transient ischemic attack)   . Macular degeneration   . Keratosis   . Anxiety   . Panic attacks   . Bruises easily   . Chills   . Generalized headaches   . Stroke   . Hx of cardiovascular stress test     Lexiscan Myoview (06/2013):  No ischemia, EF 72%; Low Risk    PAST SURGICAL HISTORY: Past Surgical History  Procedure Laterality Date  . Kidney transplant  1984  . Appendectomy    . Colon resection  2010    Colostomy  . Colostomy  2011    Reversal   . Colostomy reversal    . Left arm surgery      due to injury  . Cataract surgery bilateral    . Esophageal dilation    . Parathyroidectomy  11/04/2010    left inferior parathyroid    FAMILY HISTORY: Family History  Problem Relation Age of Onset  . Lung cancer Sister   . Cancer Sister     lung  . Heart disease Mother   . Stroke Father   . Aneurysm Brother   . Colon cancer Maternal Aunt 33  . Esophageal cancer Neg Hx   . Rectal cancer Neg Hx   . Stomach cancer Neg Hx     SOCIAL HISTORY: History   Social History  . Marital Status: Widowed    Spouse Name: N/A    Number of Children: 2  . Years of Education: 12   Occupational History  . Retired    Social History Main Topics  . Smoking status: Former Smoker    Quit date: 05/30/1989  . Smokeless tobacco: Never Used  . Alcohol Use: No  . Drug Use: No  . Sexual Activity: Not on file   Other Topics Concern  . Not on file   Social History Narrative   Right   3     PHYSICAL EXAM  Filed Vitals:   08/26/13 1106  BP: 135/76  Pulse: 66  Weight: 126 lb 12.8 oz (57.516 kg)   Body mass index is 21.1 kg/(m^2). General: well developed, well nourished, frail appearing female seated, in no evident distress  Head: head normocephalic and atraumatic. Oropharynx benign  Neck: supple with no carotid bruits  Cardiovascular: regular rate and rhythm, no murmurs  Skin: Large mole right cheek  Neurologic Exam  Mental Status: Awake and fully alert. Oriented to place  and time. Follows all commands. Speech and language normal.  Cranial Nerves: Fundi not visualized Pupils equal, briskly reactive to light. Extraocular movements full without nystagmus. Visual fields full to confrontation. Hearing intact and symmetric to finger snap. Facial sensation intact. Face, tongue, palate move normally and  symmetrically. Neck flexion and extension normal.  Motor: Normal bulk and tone. Normal strength in all tested extremity muscles.No focal weakness  Sensory.: intact to touch and pinprick and vibratory.  Coordination: Rapid alternating movements normal in all extremities. Finger-to-nose and heel-to-shin performed accurately bilaterally.  Gait and Station: Arises from chair without difficulty. Stance is normal. Able to heel, toe and mildly unsteady with tandem walk. No assistive device Reflexes: 2+ and symmetric. Toes downgoing.   DIAGNOSTIC DATA (LABS, IMAGING, TESTING) - I reviewed patient records, labs, notes, testing and imaging myself where available.      Component Value Date/Time   NA 137 07/29/2013 1040   K 3.9 07/29/2013 1040   CL 103 07/29/2013 1040   CO2 26 07/29/2013 1040   GLUCOSE 85 07/29/2013 1040   BUN 15 07/29/2013 1040   CREATININE 0.9 07/29/2013 1040   CALCIUM 9.5 07/29/2013 1040   CALCIUM 9.9 12/27/2010 1140   PROT 7.8 04/28/2013 1407   ALBUMIN 4.4 04/28/2013 1407   AST 20 04/28/2013 1407   ALT 14 04/28/2013 1407   ALKPHOS 61 04/28/2013 1407   BILITOT 0.6 04/28/2013 1407   GFRNONAA 72* 12/20/2011 1250   GFRAA 84* 12/20/2011 1250    Lab Results  Component Value Date   TSH 1.90 06/12/2013      ASSESSMENT AND PLAN  67 y.o. 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 Patient is not on medication and does not wish to be on medication. Remote history of silent cerebellar infarcts with vascular risk factors of hypertension and hyperlipidemia.  Continue  aspirin for secondary stroke prevention Strict control of lipids with LDL below 100 Blood pressure in good control today 135/76 Carotid doppler last 2012 Followup in 6-8 months Dennie Bible, [facility], [facility], APRN  [facility] [address] [place], [place] [address] [phone]

## 2013-08-26 NOTE — Progress Notes (Signed)
I agree with the above plan 

## 2013-08-26 NOTE — Patient Instructions (Addendum)
Continue aspirin for secondary stroke prevention Strict control of lipids with LDL below 100 Blood pressure in good control today 135/76 Need to check carotid doppler last done 2012 Followup in 6-8 months

## 2013-08-29 ENCOUNTER — Ambulatory Visit (INDEPENDENT_AMBULATORY_CARE_PROVIDER_SITE_OTHER): Payer: Medicare Other | Admitting: *Deleted

## 2013-08-29 VITALS — BP 119/71 | HR 61 | Ht 65.0 in | Wt 127.1 lb

## 2013-08-29 DIAGNOSIS — I10 Essential (primary) hypertension: Secondary | ICD-10-CM | POA: Diagnosis not present

## 2013-08-29 NOTE — Progress Notes (Signed)
1.) Reason for visit: BP check left arm 119/71 right arm 129/66, pulse 61 beats/minute. Pt's BP machine reads right arm 119/76, left arm 129/80.  2.) Name of MD requesting visit: Richardson Dopp PA  3.) H&P: Pt was started on Imdur 15 mg once daily  4.) ROS related to problem: pt has a history of renal Tx and diastolic CHF,HTN.  5.) Assessment and plan per MD: Continue with current plan of care.

## 2013-08-29 NOTE — Patient Instructions (Signed)
Pt is aware to continue the treatment plan.

## 2013-08-30 NOTE — Progress Notes (Signed)
Agree. Richardson Dopp, PA-C   08/30/2013 7:26 AM

## 2013-09-01 ENCOUNTER — Telehealth: Payer: Self-pay | Admitting: Neurology

## 2013-09-01 NOTE — Telephone Encounter (Signed)
Patient stated to call this number to set up her carotid doppler testing. [phone]

## 2013-09-02 ENCOUNTER — Ambulatory Visit (INDEPENDENT_AMBULATORY_CARE_PROVIDER_SITE_OTHER): Payer: Medicare Other

## 2013-09-02 ENCOUNTER — Telehealth: Payer: Self-pay | Admitting: Neurology

## 2013-09-02 DIAGNOSIS — G459 Transient cerebral ischemic attack, unspecified: Secondary | ICD-10-CM

## 2013-09-02 DIAGNOSIS — R51 Headache: Secondary | ICD-10-CM

## 2013-09-02 NOTE — Telephone Encounter (Signed)
Patient calling to state that she received a missed call after she left our office, please return call to patient and advise.

## 2013-09-03 NOTE — Telephone Encounter (Signed)
Called pt and pt stated that she wanted to know the results of the US Carotid doppler. Please advise

## 2013-09-09 ENCOUNTER — Encounter (INDEPENDENT_AMBULATORY_CARE_PROVIDER_SITE_OTHER): Payer: Self-pay | Admitting: Surgery

## 2013-09-09 ENCOUNTER — Ambulatory Visit (INDEPENDENT_AMBULATORY_CARE_PROVIDER_SITE_OTHER): Payer: Medicare Other | Admitting: Surgery

## 2013-09-09 VITALS — BP 126/72 | HR 72 | Temp 97.1°F | Ht 65.0 in | Wt 127.0 lb

## 2013-09-09 DIAGNOSIS — D492 Neoplasm of unspecified behavior of bone, soft tissue, and skin: Secondary | ICD-10-CM

## 2013-09-09 NOTE — Patient Instructions (Signed)
Lipoma  A lipoma is a noncancerous (benign) tumor composed of fat cells. They are usually found under the skin (subcutaneous). A lipoma may occur in any tissue of the body that contains fat. Common areas for lipomas to appear include the back, shoulders, buttocks, and thighs. Lipomas are a very common soft tissue growth. They are soft and grow slowly. Most problems caused by a lipoma depend on where it is growing.  DIAGNOSIS   A lipoma can be diagnosed with a physical exam. These tumors rarely become cancerous, but radiographic studies can help determine this for certain. Studies used may include:  · Computerized X-ray scans (CT or CAT scan).  · Computerized magnetic scans (MRI).  TREATMENT   Small lipomas that are not causing problems may be watched. If a lipoma continues to enlarge or causes problems, removal is often the best treatment. Lipomas can also be removed to improve appearance. Surgery is done to remove the fatty cells and the surrounding capsule. Most often, this is done with medicine that numbs the area (local anesthetic). The removed tissue is examined under a microscope to make sure it is not cancerous. Keep all follow-up appointments with your caregiver.  SEEK MEDICAL CARE IF:   · The lipoma becomes larger or hard.  · The lipoma becomes painful, red, or increasingly swollen. These could be signs of infection or a more serious condition.  Document Released: 01/27/2002 Document Revised: 05/01/2011 Document Reviewed: 07/09/2009  ExitCare® Patient Information ©2015 [employer]. This information is not intended to replace advice given to you by your health care provider. Make sure you discuss any questions you have with your health care provider.

## 2013-09-09 NOTE — Progress Notes (Signed)
General Surgery [facility]  Chief Complaint  Patient presents with  . New Evaluation    evaluate lipoma upper left back - referral from Dr. Suella Broad Jeri Cos, PA-C)    HISTORY: The patient is a 68 year old female known to my practice for evaluation of elevated parathyroid hormone levels.  Patient is referred for evaluation of a soft tissue mass overlying the right scapula. Patient states that this sometimes causes discomfort. She states it has been present for a number of years. She wishes to have it evaluated in case it needs to be removed.  PERTINENT REVIEW OF SYSTEMS: Stable in size for several years. Intermittent minor discomfort. No previous lesions removed.  EXAM: HEENT: normocephalic; pupils equal and reactive; sclerae clear;  mucous membranes moist NECK:  No palpable masses in the thyroid bed; symmetric on extension; no palpable anterior or posterior cervical lymphadenopathy; no supraclavicular masses; no tenderness CHEST: clear to auscultation bilaterally without rales, rhonchi, or wheezes CARDIAC: regular rate and rhythm without significant murmur; peripheral pulses are full EXT:  non-tender without edema; no deformity BACK:  Examination of the back shows slight asymmetry overlying the posterior aspect of the scapula bilaterally; soft tissue mass over the posterior aspect of the right scapula measuring approximately 5 x 3 x 1 cm consistent with lipoma; this is poorly marginated, not firm, and nontender NEURO: no gross focal deficits; no sign of tremor   IMPRESSION: Soft tissue mass over posterior aspect of right scapula, 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 It has been present for a number of years and relatively stable in size. It does not require surgical excision.  Patient will return for surgical care as needed.  Earnstine Regal, MD, [facility] Office: [phone]  Visit Diagnoses: 1. Neoplasm of soft tissue, left scapula

## 2013-09-12 NOTE — Telephone Encounter (Signed)
Called pt and left message informing pt per [place], NP that pt's Carotid doppler results were negative and if she has any other problems, questions or concerns to call the office.

## 2013-09-12 NOTE — Telephone Encounter (Signed)
Carotid doppler negative for significant stenosis . Please call the patient 

## 2013-09-18 ENCOUNTER — Telehealth: Payer: Self-pay | Admitting: Cardiology

## 2013-09-18 NOTE — Telephone Encounter (Signed)
Dr. Hassell Done Interpretation of 30 day Event Monitor: No Afib. 1 episode of tachy (SVT) to 128 bpm.

## 2013-09-18 NOTE — Telephone Encounter (Signed)
lmtrc

## 2013-09-23 MED ORDER — METOPROLOL TARTRATE 50 MG PO TABS: 25.0000 mg | ORAL_TABLET | Freq: Two times a day (BID) | ORAL | Status: DC

## 2013-09-23 NOTE — Addendum Note (Signed)
Addended byUlla Potash H on: 09/23/2013 04:20 PM   Modules accepted: Orders

## 2013-09-23 NOTE — Telephone Encounter (Signed)
lmtrc

## 2013-09-23 NOTE — Telephone Encounter (Signed)
Stop imdur. Increase metoprolol to 50 mg BID

## 2013-09-23 NOTE — Telephone Encounter (Signed)
Spoke with pt and she feels that she has fast HR's up to 110 bpm about twice a week. She will feel [facility] and feel the fluttering in her chest when her HR increases. She has intermittent fatigue and chest pain/burnig (stinging pain). Richardson Dopp, PA started pt on Imdur 15 mg on 08/20/13 and it doesn't seem to help with her symptoms. Pt aware that if she has worsening symptoms she should proceed to the ED; pt verbalized understanding. Pt has swelling and knots on her neck, which I advised her to see PCP about.

## 2013-09-24 NOTE — Telephone Encounter (Signed)
lmtrc

## 2013-09-25 MED ORDER — METOPROLOL TARTRATE 50 MG PO TABS: 50.0000 mg | ORAL_TABLET | Freq: Two times a day (BID) | ORAL | Status: DC

## 2013-09-25 NOTE — Addendum Note (Signed)
Addended byUlla Potash H on: 09/25/2013 02:02 PM   Modules accepted: Orders, Medications

## 2013-09-25 NOTE — Telephone Encounter (Addendum)
Pt notified, meds updated. Pt will call if symtpoms persist.

## 2013-09-30 ENCOUNTER — Other Ambulatory Visit: Payer: Self-pay

## 2013-09-30 DIAGNOSIS — Z1231 Encounter for screening mammogram for malignant neoplasm of breast: Secondary | ICD-10-CM

## 2013-10-01 DIAGNOSIS — I10 Essential (primary) hypertension: Secondary | ICD-10-CM | POA: Diagnosis not present

## 2013-10-01 DIAGNOSIS — K2270 Barrett's esophagus without dysplasia: Secondary | ICD-10-CM | POA: Diagnosis not present

## 2013-10-01 DIAGNOSIS — E785 Hyperlipidemia, unspecified: Secondary | ICD-10-CM | POA: Diagnosis not present

## 2013-10-01 DIAGNOSIS — R599 Enlarged lymph nodes, unspecified: Secondary | ICD-10-CM | POA: Diagnosis not present

## 2013-10-01 DIAGNOSIS — M810 Age-related osteoporosis without current pathological fracture: Secondary | ICD-10-CM | POA: Diagnosis not present

## 2013-10-07 ENCOUNTER — Telehealth: Payer: Self-pay | Admitting: Interventional Cardiology

## 2013-10-07 NOTE — Telephone Encounter (Signed)
Spoke with patient who states she had SOB and  chest pain on and off yesterday. Does not have any NTG. States was never given to her. States she stopped the IMDUR about 1 week ago because she felt it was not helping. Feels weak today but no chest pain or SOB. BP 139/96 and HR 63. Advised her to see Dr.Varanasi tomorrow. If she has recurrent chest pain or SOB she will report to the ER.

## 2013-10-07 NOTE — Telephone Encounter (Signed)
New message          C/o sob and cp / pt feels weak

## 2013-10-08 ENCOUNTER — Ambulatory Visit (INDEPENDENT_AMBULATORY_CARE_PROVIDER_SITE_OTHER): Payer: Medicare Other | Admitting: Interventional Cardiology

## 2013-10-08 ENCOUNTER — Encounter: Payer: Self-pay | Admitting: Interventional Cardiology

## 2013-10-08 VITALS — BP 124/80 | HR 49 | Ht 65.0 in | Wt 127.0 lb

## 2013-10-08 DIAGNOSIS — I5032 Chronic diastolic (congestive) heart failure: Secondary | ICD-10-CM

## 2013-10-08 DIAGNOSIS — I10 Essential (primary) hypertension: Secondary | ICD-10-CM

## 2013-10-08 DIAGNOSIS — R079 Chest pain, unspecified: Secondary | ICD-10-CM

## 2013-10-08 DIAGNOSIS — R0602 Shortness of breath: Secondary | ICD-10-CM | POA: Diagnosis not present

## 2013-10-08 LAB — BRAIN NATRIURETIC PEPTIDE: Pro B Natriuretic peptide (BNP): 208 pg/mL — ABNORMAL HIGH (ref 0.0–100.0)

## 2013-10-08 NOTE — Patient Instructions (Signed)
Your physician recommends that you return for lab work today for BNP.  Your physician has recommended that you have a pulmonary function test. Pulmonary Function Tests are a group of tests that measure how well air moves in and out of your lungs.  Your physician recommends that you schedule a follow-up appointment in: 3 months with Dr. Irish Lack.

## 2013-10-08 NOTE — Progress Notes (Signed)
Patient ID: Morgan Cooper, female   DOB: 1945/09/18, 69 y.o.   MRN: [id number] Patient ID: Morgan Cooper, female   DOB: November 20, 1945, 68 y.o.   MRN: [id number]    [place], [address] [place], [place]  [address] Phone: [phone] Fax:  [phone]  Date:  10/08/2013   ID:  Morgan Cooper, DOB Jun 22, 1945, MRN [id number]  PCP:  Cloyd Stagers, MD      History of Present Illness: Morgan Cooper is a 68 y.o. female with prior diastolic dysfunction. She was seen a month ago for chest pain. She had a stress test that was negative for ischemia. She continues to have worsening shortness of breath, dyspnea on exertion and chest discomfort. The pain is on the left side of her chest, above her left breast and is not always related to exertion. It is a burning pain at times. There is no pain in the center of her chest. The pain in her chest does not move. There is no associated diaphoresis, nausea or vomiting. Overall, the patient just does not feel well. In the past, she was on higher doses of Lasix and she felt better. She did feel that the Lasix may be causing dizziness so she backed off this medicine. She has not had any syncope.  She also reports that she'll wake up in the morning and have a fast heart beat at times. She thinks it gets as high as 100-105. It just makes her feel uncomfortable in general. She does go out and walk on her own. She is somewhat nervous to be out by herself.  She has been feeling ok unless she is SHOB.  She thinks the [facility] could be related to palpitations.   Since the last visit: Chest pressure has persisted.  Now sometimes with exertion-housework.  Better with lasix.  Worse with lying flat.  Today, CP has been better over the last few days, above the left breast.  It is a dull, stinging pain.   Wt Readings from Last 3 Encounters:  10/08/13 127 lb (57.607 kg)  09/09/13 127 lb (57.607 kg)  08/29/13 127 lb 2 oz (57.664 kg)     Past Medical History  Diagnosis  Date  . Renal failure   . Diverticulitis of large intestine with perforation Hx colectomy  . Hypertension   . Barrett's esophagus   . Hyperlipemia   . Esophageal stricture   . CVA (cerebral infarction)     Mini Strokes   . Pancreatic cyst   . Gastroparesis   . Depression   . Heart failure, diastolic, chronic   . Persistent headaches   . Rapid heart rate     some times  . GERD (gastroesophageal reflux disease)   . Forgetfulness   . Irritable   . Sleep difficulties   . Bone pain   . Osteoporosis   . Fatigue   . Disturbed concentration   . Itch     skin  . Nausea     little vomitting  . TIA (transient ischemic attack)   . Macular degeneration   . Keratosis   . Anxiety   . Panic attacks   . Bruises easily   . Chills   . Generalized headaches   . Stroke   . Hx of cardiovascular stress test     Lexiscan Myoview (06/2013):  No ischemia, EF 72%; Low Risk    Current Outpatient Prescriptions  Medication Sig Dispense Refill  . ammonium lactate (LAC-HYDRIN)  12 % lotion Apply 1 application topically daily as needed for dry skin.       Marland Kitchen aspirin EC 81 MG tablet Take 81 mg by mouth daily.      Marland Kitchen atorvastatin (LIPITOR) 20 MG tablet Take 20 mg by mouth daily.       Marland Kitchen azaTHIOprine (IMURAN) 50 MG tablet Take 50 mg by mouth daily.       . cycloSPORINE (RESTASIS) 0.05 % ophthalmic emulsion Place 1 drop into both eyes 2 (two) times daily.        Marland Kitchen FLUoxetine (PROZAC) 20 MG capsule Take 20 mg by mouth daily.      . furosemide (LASIX) 20 MG tablet Take 20-40 mg by mouth daily.       Marland Kitchen HYDROcodone-acetaminophen (NORCO) 10-325 MG per tablet Take 1 tablet by mouth every 6 (six) hours as needed. For pain      . metoprolol (LOPRESSOR) 50 MG tablet Take 1 tablet (50 mg total) by mouth 2 (two) times daily.  60 tablet  3  . omeprazole (PRILOSEC) 20 MG capsule Take 20 mg by mouth 2 (two) times daily.       . predniSONE (DELTASONE) 5 MG tablet Take 7.5 mg by mouth daily.       . QUEtiapine  (SEROQUEL) 100 MG tablet Take 200 mg by mouth at bedtime.       Marland Kitchen trimethoprim (TRIMPEX) 100 MG tablet Take 100 mg by mouth at bedtime.        No current facility-administered medications for this visit.    Allergies:   No Known Allergies  Social History:  The patient  reports that she quit smoking about 24 years ago. She has never used smokeless tobacco. She reports that she does not drink alcohol or use illicit drugs.   Family History:  The patient's family history includes Aneurysm in her brother; Cancer in her sister; Colon cancer (age of onset: 26) in her maternal aunt; Heart disease in her mother; Lung cancer in her sister; Stroke in her father. There is no history of Esophageal cancer, Rectal cancer, or Stomach cancer.   ROS:  Please see the history of present illness.  No nausea, vomiting.  No fevers, chills.  No focal weakness.  No dysuria.    She has sx of lightheadedneess.  No syncope.  Occurs with SHOB.  Occasional wheezing.  PHYSICAL EXAM: VS:  BP 124/80  Pulse 49  Ht 5\' 5"  (1.651 m)  Wt 127 lb (57.607 kg)  BMI 21.13 kg/m2 Well nourished, well developed, in no acute distress HEENT: normal Neck: no JVD, no carotid bruits Cardiac:  normal S1, S2; RRR;  Lungs:  clear to auscultation bilaterally, no wheezing, rhonchi or rales Abd: soft, nontender, no hepatomegaly Ext: no edema Skin: warm and dry Neuro:   no focal abnormalities noted      ASSESSMENT AND PLAN:  1. Chest pain:  Negative stress test in 07/02/13.  Atypical sx.  Some relation to exertion, but not always.  Left sided pain above the left breast.  Hesitant to pursue cath due to renal transplant.  Some relief of pressure when she takes the Lasix.  Discussed.  We are both hesitant to recommend cath due to her 68 y/o kidney transplant.  2. Palpitations: Atrial tach to 128 on event monitor in 7/15. Continue metoprolol. 3. SHOB: BNP was elevated in 4/15.  Some improvement with Lasix.  Will plan to increase Lasix 20 mg  daily.  Check BMet in a  week.  In the past, she had her chronic diastolic heart failure treated with Lasix 20 mg BID.  Sx were better, but she does not want to go back to that dose.  She felt that she may have had dizziness from this medicine.  Will check PFTs.  4. She was previously on Verapamil.  This was stopped in the past year.   Heart rate already fairly slow. 5. Overall, it is unclear whether her symptoms are related to worsening diastolic dysfunction. We'll try to treat fluid overload. She is somewhat anxious and fidgety. There may be some noncardiac pathology. Again, given her renal transplant, I would like to avoid invasive testing if possible. Hyperlipidemia  Continue Lipitor Tablet, 20 MG, 1 tablet, Orally, Once a day Notes: LDL 75 in 2013, Normal LFTs. In 2014: TG 212, HDL 37, LDL 63;   Consider repeat echo as well.  Signed, Mina Marble, MD, [facility] 10/08/2013 2:44 PM

## 2013-10-13 ENCOUNTER — Encounter: Payer: Medicare Other | Admitting: Internal Medicine

## 2013-10-13 DIAGNOSIS — R0602 Shortness of breath: Secondary | ICD-10-CM

## 2013-10-13 LAB — PULMONARY FUNCTION TEST
DL/VA % pred: 54 %
DL/VA: 2.67 ml/min/mmHg/L
DLCO unc % pred: 35 %
DLCO unc: 9.02 ml/min/mmHg
FEF 25-75 POST: 0.85 L/s
FEF 25-75 Pre: 1.13 L/sec
FEF2575-%Change-Post: -24 %
FEF2575-%Pred-Post: 42 %
FEF2575-%Pred-Pre: 56 %
FEV1-%Change-Post: -4 %
FEV1-%Pred-Post: 64 %
FEV1-%Pred-Pre: 67 %
FEV1-POST: 1.53 L
FEV1-PRE: 1.61 L
FEV1FVC-%CHANGE-POST: 0 %
FEV1FVC-%PRED-PRE: 96 %
FEV6-%Change-Post: -4 %
FEV6-%PRED-PRE: 71 %
FEV6-%Pred-Post: 68 %
FEV6-Post: 2.06 L
FEV6-Pre: 2.16 L
FEV6FVC-%Change-Post: 0 %
FEV6FVC-%Pred-Post: 103 %
FEV6FVC-%Pred-Pre: 103 %
FVC-%Change-Post: -4 %
FVC-%Pred-Post: 66 %
FVC-%Pred-Pre: 69 %
FVC-PRE: 2.19 L
FVC-Post: 2.09 L
PRE FEV1/FVC RATIO: 73 %
PRE FEV6/FVC RATIO: 100 %
Post FEV1/FVC ratio: 74 %
Post FEV6/FVC ratio: 99 %
RV % pred: 18 %
RV: 0.42 L
TLC % pred: 61 %
TLC: 3.19 L

## 2013-10-15 ENCOUNTER — Telehealth: Payer: Self-pay | Admitting: Cardiology

## 2013-10-15 DIAGNOSIS — R942 Abnormal results of pulmonary function studies: Secondary | ICD-10-CM

## 2013-10-15 NOTE — Telephone Encounter (Signed)
Referral to Pulmonology sent.

## 2013-10-23 ENCOUNTER — Ambulatory Visit (INDEPENDENT_AMBULATORY_CARE_PROVIDER_SITE_OTHER): Payer: Medicare Other | Admitting: Emergency Medicine

## 2013-10-23 ENCOUNTER — Encounter: Payer: Self-pay | Admitting: Emergency Medicine

## 2013-10-23 VITALS — BP 122/70 | HR 56 | Temp 97.8°F | Ht 65.0 in | Wt 128.8 lb

## 2013-10-23 DIAGNOSIS — R0989 Other specified symptoms and signs involving the circulatory and respiratory systems: Secondary | ICD-10-CM | POA: Diagnosis not present

## 2013-10-23 DIAGNOSIS — R0609 Other forms of dyspnea: Secondary | ICD-10-CM | POA: Diagnosis not present

## 2013-10-23 MED ORDER — ALBUTEROL SULFATE 108 (90 BASE) MCG/ACT IN AEPB: 2.0000 | INHALATION_SPRAY | RESPIRATORY_TRACT | Status: DC | PRN

## 2013-10-23 NOTE — Progress Notes (Signed)
Subjective:    Patient ID: Morgan Cooper, female    DOB: 06/13/1945, 68 y.o.   MRN: [id number]  HPI 68 yo former tobacco (2.5pk-yrs) , hx of HTN + diastolic CHF, s/p renal tx on imuran and pred 7.5 qd, GERD / Barrett's / esophageal stricture. She has been having SOB for several years, but certainly worse for the last few months. She has occasional CP both at rest and w exertion. Dr Irish Lack performed a low risk Lexiscan in 5/'15. Occasionally some wheeze. No real cough or sputum. She is on a higher dose metoprolol, starting 1 month ago. She has ben tried on BD's in the distant past, not recently.    Review of Systems  Constitutional: Negative for fever and unexpected weight change.  HENT: Positive for sinus pressure, sore throat and trouble swallowing. Negative for congestion, dental problem, ear pain, nosebleeds, postnasal drip, rhinorrhea and sneezing.   Eyes: Negative for redness and itching.  Respiratory: Positive for cough, chest tightness, shortness of breath and wheezing.   Cardiovascular: Positive for palpitations and leg swelling.  Gastrointestinal: Positive for nausea and vomiting.  Genitourinary: Negative for dysuria.  Musculoskeletal: Positive for arthralgias. Negative for joint swelling.  Skin: Negative for rash.  Neurological: Positive for headaches.  Hematological: Does not bruise/bleed easily.  Psychiatric/Behavioral: Positive for dysphoric mood. The patient is nervous/anxious.    Past Medical History  Diagnosis Date  . Renal failure   . Diverticulitis of large intestine with perforation Hx colectomy  . Hypertension   . Barrett's esophagus   . Hyperlipemia   . Esophageal stricture   . CVA (cerebral infarction)     Mini Strokes   . Pancreatic cyst   . Gastroparesis   . Depression   . Heart failure, diastolic, chronic   . Persistent headaches   . Rapid heart rate     some times  . GERD (gastroesophageal reflux disease)   . Forgetfulness   . Irritable   . Sleep  difficulties   . Bone pain   . Osteoporosis   . Fatigue   . Disturbed concentration   . Itch     skin  . Nausea     little vomitting  . TIA (transient ischemic attack)   . Macular degeneration   . Keratosis   . Anxiety   . Panic attacks   . Bruises easily   . Chills   . Generalized headaches   . Stroke   . Hx of cardiovascular stress test     Lexiscan Myoview (06/2013):  No ischemia, EF 72%; Low Risk     Family History  Problem Relation Age of Onset  . Lung cancer Sister   . Cancer    . Heart disease Mother   . Stroke Father   . Aneurysm Brother   . Colon cancer Maternal Aunt 52  . Esophageal cancer Neg Hx   . Rectal cancer Neg Hx   . Stomach cancer Neg Hx      History   Social History  . Marital Status: Widowed    Spouse Name: N/A    Number of Children: 2  . Years of Education: 12   Occupational History  . Retired    Social History Main Topics  . Smoking status: Former Smoker -- 1.00 packs/day for 5 years    Types: Cigarettes    Quit date: 05/30/1989  . Smokeless tobacco: Never Used  . Alcohol Use: No  . Drug Use: No  . Sexual Activity: Not  on file   Other Topics Concern  . Not on file   Social History Narrative   Patient is widowed with 2 children   Patient is right handed   Patient has a high school education   Patient drinks 3 cups daily        No occupational exposures.   No Known Allergies   Outpatient Prescriptions Prior to Visit  Medication Sig Dispense Refill  . ammonium lactate (LAC-HYDRIN) 12 % lotion Apply 1 application topically daily as needed for dry skin.       Marland Kitchen aspirin EC 81 MG tablet Take 81 mg by mouth daily.      Marland Kitchen atorvastatin (LIPITOR) 20 MG tablet Take 20 mg by mouth daily.       Marland Kitchen azaTHIOprine (IMURAN) 50 MG tablet Take 50 mg by mouth daily.       . cycloSPORINE (RESTASIS) 0.05 % ophthalmic emulsion Place 1 drop into both eyes 2 (two) times daily.        Marland Kitchen FLUoxetine (PROZAC) 20 MG capsule Take 20 mg by mouth daily.       . furosemide (LASIX) 20 MG tablet Take 20-40 mg by mouth daily.       Marland Kitchen HYDROcodone-acetaminophen (NORCO) 10-325 MG per tablet Take 1 tablet by mouth every 6 (six) hours as needed. For pain      . metoprolol (LOPRESSOR) 50 MG tablet Take 1 tablet (50 mg total) by mouth 2 (two) times daily.  60 tablet  3  . omeprazole (PRILOSEC) 20 MG capsule Take 20 mg by mouth 2 (two) times daily.       . predniSONE (DELTASONE) 5 MG tablet Take 7.5 mg by mouth daily.       . QUEtiapine (SEROQUEL) 100 MG tablet Take 200 mg by mouth at bedtime.       Marland Kitchen trimethoprim (TRIMPEX) 100 MG tablet Take 100 mg by mouth at bedtime.        No facility-administered medications prior to visit.         Objective:   Physical Exam Filed Vitals:   10/23/13 1408  BP: 122/70  Pulse: 56  Temp: 97.8 F (36.6 C)  Height: 5\' 5"  (1.651 m)  Weight: 128 lb 12.8 oz (58.423 kg)  SpO2: 97%   Gen: Pleasant, well-nourished, in no distress,  normal affect  ENT: No lesions,  mouth clear,  oropharynx clear, no postnasal drip  Neck: No JVD, no TMG, no carotid bruits  Lungs: No use of accessory muscles, clear without rales or rhonchi  Cardiovascular: RRR, heart sounds normal, no murmur or gallops, no peripheral edema  Musculoskeletal: No deformities, no cyanosis or clubbing  Neuro: alert, non focal  Skin: Warm, no lesions or rashes      Assessment & Plan:  Dyspnea on exertion Slow progressive dyspnea that has been acutely worse over last 2 months. Etiology not clear but she has a reassuring cardiac exam. Consider also volume status due to her underlying renal disease, anemia in the setting of immunosuppressive regimen.  PFT showed mixed obstruction and restriction in the setting of a minimal tobacco exposure. Chest x-ray from July showed no infiltrates but evidence for hyperinflation. Will perform a trial of albuterol to see if she benefits. If so then we may decide to start a scheduled inhaled medication. rov 3

## 2013-10-23 NOTE — Patient Instructions (Signed)
Please try using albuterol 2 puffs up to every 4 hours if needed for shortness of breath Followup in 3 months to discuss whether he had benefited from this medication. If so we may decide to start an inhaled medicine that you take everyday on the schedule.

## 2013-10-23 NOTE — Assessment & Plan Note (Addendum)
Slow progressive dyspnea that has been acutely worse over last 2 months. Etiology not clear but she has a reassuring cardiac exam. Consider also volume status due to her underlying renal disease, anemia in the setting of immunosuppressive regimen.  PFT showed mixed obstruction and restriction in the setting of a minimal tobacco exposure. Chest x-ray from July showed no infiltrates but evidence for hyperinflation. Will perform a trial of albuterol to see if she benefits. If so then we may decide to start a scheduled inhaled medication. rov 3

## 2013-11-03 ENCOUNTER — Ambulatory Visit
Admission: RE | Admit: 2013-11-03 | Discharge: 2013-11-03 | Disposition: A | Discharge status: Home / Self Care | Payer: Medicare Other | Source: Ambulatory Visit

## 2013-11-03 DIAGNOSIS — Z1231 Encounter for screening mammogram for malignant neoplasm of breast: Secondary | ICD-10-CM

## 2013-11-13 DIAGNOSIS — F330 Major depressive disorder, recurrent, mild: Secondary | ICD-10-CM | POA: Diagnosis not present

## 2013-11-20 ENCOUNTER — Other Ambulatory Visit: Payer: Self-pay | Admitting: *Deleted

## 2013-11-20 MED ORDER — METOPROLOL TARTRATE 50 MG PO TABS: 50.0000 mg | ORAL_TABLET | Freq: Two times a day (BID) | ORAL | Status: DC

## 2013-12-02 DIAGNOSIS — Z79891 Long term (current) use of opiate analgesic: Secondary | ICD-10-CM | POA: Diagnosis not present

## 2013-12-02 DIAGNOSIS — G894 Chronic pain syndrome: Secondary | ICD-10-CM | POA: Diagnosis not present

## 2013-12-09 DIAGNOSIS — F330 Major depressive disorder, recurrent, mild: Secondary | ICD-10-CM | POA: Diagnosis not present

## 2013-12-16 DIAGNOSIS — N390 Urinary tract infection, site not specified: Secondary | ICD-10-CM | POA: Diagnosis not present

## 2013-12-16 DIAGNOSIS — R300 Dysuria: Secondary | ICD-10-CM | POA: Diagnosis not present

## 2014-01-07 ENCOUNTER — Encounter: Payer: Self-pay | Admitting: Interventional Cardiology

## 2014-01-07 ENCOUNTER — Ambulatory Visit (INDEPENDENT_AMBULATORY_CARE_PROVIDER_SITE_OTHER): Payer: Medicare Other | Admitting: Interventional Cardiology

## 2014-01-07 VITALS — BP 132/72 | HR 61 | Ht 65.0 in | Wt 130.0 lb

## 2014-01-07 DIAGNOSIS — R0600 Dyspnea, unspecified: Secondary | ICD-10-CM | POA: Diagnosis not present

## 2014-01-07 DIAGNOSIS — I471 Supraventricular tachycardia: Secondary | ICD-10-CM | POA: Diagnosis not present

## 2014-01-07 DIAGNOSIS — E782 Mixed hyperlipidemia: Secondary | ICD-10-CM

## 2014-01-07 DIAGNOSIS — I5032 Chronic diastolic (congestive) heart failure: Secondary | ICD-10-CM | POA: Diagnosis not present

## 2014-01-07 DIAGNOSIS — I10 Essential (primary) hypertension: Secondary | ICD-10-CM

## 2014-01-07 NOTE — Progress Notes (Signed)
Patient ID: Morgan Cooper, female   DOB: 1945/09/24, 68 y.o.   MRN: [id number]    [place], [address] [place], [place]  [address] Phone: [phone] Fax:  [phone]  Date:  01/07/2014   ID:  Morgan Cooper, DOB [birth year], MRN [id number]  PCP:  Cloyd Stagers, MD      History of Present Illness: Morgan Cooper is a 68 y.o. female with prior diastolic dysfunction. She was seen 3 months ago for chest pain and SHOB. She had a stress test that was negative for ischemia in 2015. She continues to have shortness of breath, dyspnea on exertion.  The chest discomfort has resolved. There is occasional palpitations, nausea or vomiting. Overall, the patient still just does not feel well. In the past, she was on higher doses of Lasix and she felt better. She did feel that the Lasix may be causing dizziness so she backed off this medicine. She does not take it daily. She has not had any syncope.  She had one episode where she could not operate a gas pump in October.  She felt confusede but it resolved spontaneuosly.  No further episodes.  BP has been up and down.  When low, she skips the Lasix.  Highest BP reading has been in the 180 range.    Wt Readings from Last 3 Encounters:  01/07/14 130 lb (58.968 kg)  10/23/13 128 lb 12.8 oz (58.423 kg)  10/08/13 127 lb (57.607 kg)     Past Medical History  Diagnosis Date  . Renal failure   . Diverticulitis of large intestine with perforation Hx colectomy  . Hypertension   . Barrett's esophagus   . Hyperlipemia   . Esophageal stricture   . CVA (cerebral infarction)     Mini Strokes   . Pancreatic cyst   . Gastroparesis   . Depression   . Heart failure, diastolic, chronic   . Persistent headaches   . Rapid heart rate     some times  . GERD (gastroesophageal reflux disease)   . Forgetfulness   . Irritable   . Sleep difficulties   . Bone pain   . Osteoporosis   . Fatigue   . Disturbed concentration   . Itch     skin  .  Nausea     little vomitting  . TIA (transient ischemic attack)   . Macular degeneration   . Keratosis   . Anxiety   . Panic attacks   . Bruises easily   . Chills   . Generalized headaches   . Stroke   . Hx of cardiovascular stress test     Lexiscan Myoview (06/2013):  No ischemia, EF 72%; Low Risk    Current Outpatient Prescriptions  Medication Sig Dispense Refill  . Albuterol Sulfate (PROAIR RESPICLICK) 748 (90 BASE) MCG/ACT AEPB Inhale 2 puffs into the lungs every 4 (four) hours as needed. 1 each 0  . ammonium lactate (LAC-HYDRIN) 12 % lotion Apply 1 application topically daily as needed for dry skin.     Marland Kitchen atorvastatin (LIPITOR) 20 MG tablet Take 20 mg by mouth daily.     Marland Kitchen azaTHIOprine (IMURAN) 50 MG tablet Take 50 mg by mouth daily.     . cycloSPORINE (RESTASIS) 0.05 % ophthalmic emulsion Place 1 drop into both eyes 2 (two) times daily.      Marland Kitchen FLUoxetine (PROZAC) 20 MG capsule Take 20 mg by mouth daily.    . furosemide (LASIX) 20 MG  tablet Take 20-40 mg by mouth daily.     Marland Kitchen HYDROcodone-acetaminophen (NORCO) 10-325 MG per tablet Take 1 tablet by mouth every 6 (six) hours as needed. For pain    . metoprolol (LOPRESSOR) 50 MG tablet Take 1 tablet (50 mg total) by mouth 2 (two) times daily. 60 tablet 1  . omeprazole (PRILOSEC) 20 MG capsule Take 20 mg by mouth 2 (two) times daily.     . predniSONE (DELTASONE) 5 MG tablet Take 7.5 mg by mouth daily.     . QUEtiapine (SEROQUEL) 100 MG tablet Take 200 mg by mouth at bedtime.     Marland Kitchen trimethoprim (TRIMPEX) 100 MG tablet Take 100 mg by mouth at bedtime.     Marland Kitchen aspirin EC 81 MG tablet Take 81 mg by mouth daily.     No current facility-administered medications for this visit.    Allergies:   No Known Allergies  Social History:  The patient  reports that she quit smoking about 24 years ago. Her smoking use included Cigarettes. She has a 5 pack-year smoking history. She has never used smokeless tobacco. She reports that she does not drink  alcohol or use illicit drugs.   Family History:  The patient's family history includes Aneurysm in her brother; Cancer in an other family member; Colon cancer (age of onset: 87) in her maternal aunt; Heart disease in her mother; Lung cancer in her sister; Stroke in her father. There is no history of Esophageal cancer, Rectal cancer, or Stomach cancer.   ROS:  Please see the history of present illness.  No nausea, vomiting.  No fevers, chills.  No focal weakness.  No dysuria.    She has sx of lightheadedneess.  No syncope.  Occurs with SHOB.  Occasional wheezing.  PHYSICAL EXAM: VS:  BP 132/72 mmHg  Pulse 61  Ht 5\' 5"  (1.651 m)  Wt 130 lb (58.968 kg)  BMI 21.63 kg/m2 Well nourished, well developed, in no acute distress HEENT: normal Neck: no JVD, no carotid bruits Cardiac:  normal S1, S2; RRR;  Lungs:  clear to auscultation bilaterally, no wheezing, rhonchi or rales Abd: soft, nontender, no hepatomegaly Ext: no edema Skin: warm and dry Neuro:   no focal abnormalities noted Psych: mildly anxious and fidgety      ASSESSMENT AND PLAN:  1. Chest pain:  Negative stress test in 07/02/13.  Atypical sx.  Resolved.  We are both hesitant to recommend cath due to her 68 y/o kidney transplant.  2. Palpitations: Atrial tach to 128 on event monitor in 7/15. Continue metoprolol- will not increase due to her low BP readings at times and already low HR.  If this becomes limiting, consider EP referral.  Palpitations are more noticeable off of the metoprolol. He skips a few doses because of low BP.   3. SHOB/ chronic diastolic heart failure: BNP was elevated in 4/15.  Some improvement with Lasix in the past.  She is reluctant to take Lasix 20 mg daily- she prefers to adjust prn. She can feel swelling at times as well. In the past, she had her chronic diastolic heart failure treated with Lasix 20 mg BID.  Sx were better, but she does not want to go back to that dose.  She felt that she may have had dizziness  from this medicine.  PFTs   Showed some obstructive and restrictive disease.  DLCO reduced.  Being seen by Dr. Lamonte Sakai.  Likely from both cardiac and pulmonary etiology. 4. She was  previously on Verapamil.  This was stopped due to  Heart rate already fairly slow. 5.  Hyperlipidemia  Continue Lipitor Tablet, 20 MG, 1 tablet, Orally, Once a day Notes: LDL 75 in 2013, Normal LFTs. In 2014: TG 212, HDL 37, LDL 63;  Followed by PMD 6. HTN: Controlled today. She will let us know if it runs high despite her usual meds.    Signed, Mina Marble, MD, [facility] 01/07/2014 3:31 PM

## 2014-01-07 NOTE — Patient Instructions (Signed)
Your physician recommends that you continue on your current medications as directed. Please refer to the Current Medication list given to you today.  Your physician wants you to follow-up in: 1 year with Dr. Varanasi. You will receive a reminder letter in the mail two months in advance. If you don't receive a letter, please call our office to schedule the follow-up appointment.  

## 2014-01-30 DIAGNOSIS — R109 Unspecified abdominal pain: Secondary | ICD-10-CM | POA: Diagnosis not present

## 2014-01-30 DIAGNOSIS — E559 Vitamin D deficiency, unspecified: Secondary | ICD-10-CM | POA: Diagnosis not present

## 2014-01-30 DIAGNOSIS — Z23 Encounter for immunization: Secondary | ICD-10-CM | POA: Diagnosis not present

## 2014-01-30 DIAGNOSIS — R110 Nausea: Secondary | ICD-10-CM | POA: Diagnosis not present

## 2014-02-23 ENCOUNTER — Ambulatory Visit: Payer: Medicare Other | Admitting: Emergency Medicine

## 2014-02-24 DIAGNOSIS — F330 Major depressive disorder, recurrent, mild: Secondary | ICD-10-CM | POA: Diagnosis not present

## 2014-03-03 DIAGNOSIS — N390 Urinary tract infection, site not specified: Secondary | ICD-10-CM | POA: Diagnosis not present

## 2014-03-09 ENCOUNTER — Emergency Department (HOSPITAL_COMMUNITY)
Admission: EM | Admit: 2014-03-09 | Discharge: 2014-03-09 | Disposition: A | Discharge status: Home / Self Care | Payer: Medicare Other | Attending: Emergency Medicine | Admitting: Emergency Medicine

## 2014-03-09 ENCOUNTER — Emergency Department (HOSPITAL_COMMUNITY): Payer: Medicare Other

## 2014-03-09 ENCOUNTER — Telehealth: Payer: Self-pay | Admitting: Interventional Cardiology

## 2014-03-09 ENCOUNTER — Encounter (HOSPITAL_COMMUNITY): Payer: Self-pay | Admitting: *Deleted

## 2014-03-09 DIAGNOSIS — R079 Chest pain, unspecified: Secondary | ICD-10-CM | POA: Insufficient documentation

## 2014-03-09 DIAGNOSIS — F410 Panic disorder [episodic paroxysmal anxiety] without agoraphobia: Secondary | ICD-10-CM | POA: Diagnosis not present

## 2014-03-09 DIAGNOSIS — I10 Essential (primary) hypertension: Secondary | ICD-10-CM | POA: Insufficient documentation

## 2014-03-09 DIAGNOSIS — Z8673 Personal history of transient ischemic attack (TIA), and cerebral infarction without residual deficits: Secondary | ICD-10-CM | POA: Diagnosis not present

## 2014-03-09 DIAGNOSIS — Z8669 Personal history of other diseases of the nervous system and sense organs: Secondary | ICD-10-CM | POA: Diagnosis not present

## 2014-03-09 DIAGNOSIS — I5032 Chronic diastolic (congestive) heart failure: Secondary | ICD-10-CM | POA: Insufficient documentation

## 2014-03-09 DIAGNOSIS — Z872 Personal history of diseases of the skin and subcutaneous tissue: Secondary | ICD-10-CM | POA: Insufficient documentation

## 2014-03-09 DIAGNOSIS — Z7982 Long term (current) use of aspirin: Secondary | ICD-10-CM | POA: Insufficient documentation

## 2014-03-09 DIAGNOSIS — R0602 Shortness of breath: Secondary | ICD-10-CM | POA: Diagnosis not present

## 2014-03-09 DIAGNOSIS — Z7952 Long term (current) use of systemic steroids: Secondary | ICD-10-CM | POA: Diagnosis not present

## 2014-03-09 DIAGNOSIS — R0789 Other chest pain: Secondary | ICD-10-CM | POA: Diagnosis not present

## 2014-03-09 DIAGNOSIS — F329 Major depressive disorder, single episode, unspecified: Secondary | ICD-10-CM | POA: Diagnosis not present

## 2014-03-09 DIAGNOSIS — Z8639 Personal history of other endocrine, nutritional and metabolic disease: Secondary | ICD-10-CM | POA: Diagnosis not present

## 2014-03-09 DIAGNOSIS — Z79899 Other long term (current) drug therapy: Secondary | ICD-10-CM | POA: Insufficient documentation

## 2014-03-09 DIAGNOSIS — K2270 Barrett's esophagus without dysplasia: Secondary | ICD-10-CM | POA: Insufficient documentation

## 2014-03-09 DIAGNOSIS — Z8739 Personal history of other diseases of the musculoskeletal system and connective tissue: Secondary | ICD-10-CM | POA: Diagnosis not present

## 2014-03-09 DIAGNOSIS — N19 Unspecified kidney failure: Secondary | ICD-10-CM | POA: Diagnosis not present

## 2014-03-09 DIAGNOSIS — K219 Gastro-esophageal reflux disease without esophagitis: Secondary | ICD-10-CM | POA: Insufficient documentation

## 2014-03-09 DIAGNOSIS — Z940 Kidney transplant status: Secondary | ICD-10-CM | POA: Diagnosis not present

## 2014-03-09 DIAGNOSIS — R05 Cough: Secondary | ICD-10-CM | POA: Diagnosis not present

## 2014-03-09 DIAGNOSIS — Z87891 Personal history of nicotine dependence: Secondary | ICD-10-CM | POA: Diagnosis not present

## 2014-03-09 HISTORY — DX: Heart failure, unspecified: I50.9

## 2014-03-09 LAB — BASIC METABOLIC PANEL
ANION GAP: 11 (ref 5–15)
BUN: 6 mg/dL (ref 6–23)
CALCIUM: 9.6 mg/dL (ref 8.4–10.5)
CO2: 21 mmol/L (ref 19–32)
Chloride: 106 mEq/L (ref 96–112)
Creatinine, Ser: 0.98 mg/dL (ref 0.50–1.10)
GFR calc non Af Amer: 58 mL/min — ABNORMAL LOW (ref >90)
GFR, EST AFRICAN AMERICAN: 67 mL/min — AB (ref >90)
GLUCOSE: 105 mg/dL — AB (ref 70–99)
POTASSIUM: 3.9 mmol/L (ref 3.5–5.1)
Sodium: 138 mmol/L (ref 135–145)

## 2014-03-09 LAB — CBC
HCT: 39.3 % (ref 36.0–46.0)
Hemoglobin: 12.9 g/dL (ref 12.0–15.0)
MCH: 28.5 pg (ref 26.0–34.0)
MCHC: 32.8 g/dL (ref 30.0–36.0)
MCV: 86.8 fL (ref 78.0–100.0)
Platelets: 321 10*3/uL (ref 150–400)
RBC: 4.53 MIL/uL (ref 3.87–5.11)
RDW: 15.3 % (ref 11.5–15.5)
WBC: 6.8 10*3/uL (ref 4.0–10.5)

## 2014-03-09 LAB — BRAIN NATRIURETIC PEPTIDE: B Natriuretic Peptide: 287.7 pg/mL — ABNORMAL HIGH (ref 0.0–100.0)

## 2014-03-09 LAB — I-STAT TROPONIN, ED: Troponin i, poc: 0.01 ng/mL (ref 0.00–0.08)

## 2014-03-09 MED ORDER — FUROSEMIDE 10 MG/ML IJ SOLN
40.0000 mg | Freq: Once | INTRAMUSCULAR | Status: AC
  Administered 2014-03-09: 40 mg via INTRAVENOUS
  Filled 2014-03-09: qty 4

## 2014-03-09 NOTE — ED Notes (Signed)
Pt sates hat she has had generalized chest pain for approx 1 week but has worsened over the last 2 days. Pt states that she was walking when the pain began. Pt states that the pain is intermittent and is relieved with rest.

## 2014-03-09 NOTE — Telephone Encounter (Signed)
New Message  Pt c/o of Chest Pain: 1. Are you having CP right now? Little, 1 hour ago very severe 2. Are you experiencing any other symptoms (ex. SOB, nausea, vomiting, sweating)? Last night- sweating/nausea 3. How long have you been experiencing CP? Last week or so been getting worse 4. Is your CP continuous or coming and going? Comes and goes, feels better when rested 5. Have you taken Nitroglycerin? No, doesn't have any. Just aspirin this morning

## 2014-03-09 NOTE — Discharge Instructions (Signed)

## 2014-03-09 NOTE — Telephone Encounter (Signed)
**Note De-Identified  Obfuscation** The pt c/o off and on CP that radiates to her neck, SOB, nausea and diaphoresis for the past week.  She states that at times her CP is severe. She reports that her BP at this time is 190/120. The pt is advised to call 911 or have her sister, who is with her at this time, drive her to the ER for a complete cardiac evaluation. She verbalized understanding and states that her sister will drive her to the ER as soon as we end this call.  Will forward message to Dr Irish Lack as Juluis Rainier.

## 2014-03-09 NOTE — ED Provider Notes (Signed)
CSN: [id number]     Arrival date & time 03/09/14  1359 History   First MD Initiated Contact with Patient 03/09/14 1503     Chief Complaint  Patient presents with  . Chest Pain     (Consider location/radiation/quality/duration/timing/severity/associated sxs/prior Treatment) HPI 69 year old female past mental history as below who has no known coronary artery disease,  who presents to ED complaining of substernal chest pain across bilateral chest which has been intermittent for last 1 wk. Pt reports pain is only mild, nonradiating. Says she has had in past. Not currently having any pain. Assoc sxs include mild SOB, and currently without this as well. She also has dry cough last few wks. No fever, chills. Denies leg swelling. No diaphoresis. Says at baseline, has N/V intermittently and is unchanged now. No abd pain. No modifying factors. No sick contacts.   Past Medical History  Diagnosis Date  . Renal failure   . Diverticulitis of large intestine with perforation Hx colectomy  . Hypertension   . Barrett's esophagus   . Hyperlipemia   . Esophageal stricture   . CVA (cerebral infarction)     Mini Strokes   . Pancreatic cyst   . Gastroparesis   . Depression   . Heart failure, diastolic, chronic   . Persistent headaches   . Rapid heart rate     some times  . GERD (gastroesophageal reflux disease)   . Forgetfulness   . Irritable   . Sleep difficulties   . Bone pain   . Osteoporosis   . Fatigue   . Disturbed concentration   . Itch     skin  . Nausea     little vomitting  . TIA (transient ischemic attack)   . Macular degeneration   . Keratosis   . Anxiety   . Panic attacks   . Bruises easily   . Chills   . Generalized headaches   . Stroke   . Hx of cardiovascular stress test     Lexiscan Myoview (06/2013):  No ischemia, EF 72%; Low Risk  . CHF (congestive heart failure)    Past Surgical History  Procedure Laterality Date  . Kidney transplant  1984  . Appendectomy    .  Colon resection  2010    Colostomy  . Colostomy  2011    Reversal   . Colostomy reversal    . Left arm surgery      due to injury  . Cataract surgery bilateral    . Esophageal dilation    . Parathyroidectomy  11/04/2010    left inferior parathyroid   Family History  Problem Relation Age of Onset  . Lung cancer Sister   . Cancer    . Heart disease Mother   . Stroke Father   . Aneurysm Brother   . Colon cancer Maternal Aunt 86  . Esophageal cancer Neg Hx   . Rectal cancer Neg Hx   . Stomach cancer Neg Hx    History  Substance Use Topics  . Smoking status: Former Smoker -- 1.00 packs/day for 5 years    Types: Cigarettes    Quit date: 05/30/1989  . Smokeless tobacco: Never Used  . Alcohol Use: No   OB History    No data available     Review of Systems  Constitutional: Negative for fever and chills.  HENT: Negative for congestion, rhinorrhea and sore throat.   Eyes: Negative for visual disturbance.  Respiratory: Negative for cough and shortness of breath.  Cardiovascular: Negative for chest pain, palpitations and leg swelling.  Gastrointestinal: Negative for nausea, vomiting, abdominal pain, diarrhea and constipation.  Genitourinary: Negative for dysuria, hematuria, vaginal bleeding and vaginal discharge.  Musculoskeletal: Negative for back pain and neck pain.  Skin: Negative for rash.  Neurological: Negative for weakness and headaches.  All other systems reviewed and are negative.     Allergies  Review of patient's allergies indicates no known allergies.  Home Medications   Prior to Admission medications   Medication Sig Start Date End Date Taking? Authorizing Provider  Albuterol Sulfate (PROAIR RESPICLICK) 262 (90 BASE) MCG/ACT AEPB Inhale 2 puffs into the lungs every 4 (four) hours as needed. 10/23/13   [name], MD  ammonium lactate (LAC-HYDRIN) 12 % lotion Apply 1 application topically daily as needed for dry skin.  01/04/12   Historical Provider, MD   aspirin EC 81 MG tablet Take 81 mg by mouth daily.    Historical Provider, MD  atorvastatin (LIPITOR) 20 MG tablet Take 20 mg by mouth daily.     Historical Provider, MD  azaTHIOprine (IMURAN) 50 MG tablet Take 50 mg by mouth daily.     Historical Provider, MD  cycloSPORINE (RESTASIS) 0.05 % ophthalmic emulsion Place 1 drop into both eyes 2 (two) times daily.      Historical Provider, MD  FLUoxetine (PROZAC) 20 MG capsule Take 20 mg by mouth daily.    Historical Provider, MD  furosemide (LASIX) 20 MG tablet Take 20-40 mg by mouth daily.     Historical Provider, MD  HYDROcodone-acetaminophen (NORCO) 10-325 MG per tablet Take 1 tablet by mouth every 6 (six) hours as needed. For pain    Historical Provider, MD  metoprolol (LOPRESSOR) 50 MG tablet Take 1 tablet (50 mg total) by mouth 2 (two) times daily. 11/20/13   Jettie Booze, MD  omeprazole (PRILOSEC) 20 MG capsule Take 20 mg by mouth 2 (two) times daily.     Historical Provider, MD  predniSONE (DELTASONE) 5 MG tablet Take 7.5 mg by mouth daily.     Historical Provider, MD  QUEtiapine (SEROQUEL) 100 MG tablet Take 200 mg by mouth at bedtime.     Historical Provider, MD  trimethoprim (TRIMPEX) 100 MG tablet Take 100 mg by mouth at bedtime.  07/15/13   Historical Provider, MD   BP 172/91 mmHg  Pulse 55  Temp(Src) 97.9 F (36.6 C) (Oral)  Resp 13  Ht 5\' 5"  (1.651 m)  Wt 128 lb (58.06 kg)  BMI 21.30 kg/m2  SpO2 99% Physical Exam  Constitutional: She is oriented to person, place, and time. She appears well-developed and well-nourished. No distress.  HENT:  Head: Normocephalic and atraumatic.  Eyes: Conjunctivae are normal.  Neck: Normal range of motion.  Cardiovascular: Normal rate, regular rhythm, normal heart sounds and intact distal pulses.   No murmur heard. No JVD, no lower extremity edema.  Pulmonary/Chest: Effort normal. No respiratory distress. She has no wheezes. She has rales. She exhibits no tenderness.  Abdominal: Soft.  Bowel sounds are normal. She exhibits no distension.  Musculoskeletal: Normal range of motion.  Neurological: She is alert and oriented to person, place, and time. No cranial nerve deficit.  Skin: Skin is warm and dry.  Psychiatric: She has a normal mood and affect.  Nursing note and vitals reviewed.   ED Course  Procedures (including critical care time) Labs Review Labs Reviewed  BASIC METABOLIC PANEL - Abnormal; Notable for the following:    Glucose, Bld 105 (*)  GFR calc non Af Amer 58 (*)    GFR calc Af Amer 67 (*)    All other components within normal limits  BRAIN NATRIURETIC PEPTIDE - Abnormal; Notable for the following:    B Natriuretic Peptide 287.7 (*)    All other components within normal limits  CBC  I-STAT TROPOININ, ED    Imaging Review Dg Chest 2 View  03/09/2014   CLINICAL DATA:  Left-sided chest pain, shortness of breath, cough  EXAM: CHEST  2 VIEW  COMPARISON:  08/20/2013  FINDINGS: There is no focal parenchymal opacity, pleural effusion, or pneumothorax. There is stable cardiomegaly. There is thoracic aortic atherosclerosis.  The osseous structures are unremarkable.  IMPRESSION: No active cardiopulmonary disease.   Electronically Signed   By: Kathreen Devoid   On: 03/09/2014 15:14     EKG Interpretation None      MDM   Final diagnoses:  Chest pain   KEISHA AMER is a 69 y.o. female with H&P as above. The patient comes in with chest pain. She is hemodynamically stable in the ED and in no apparent distress and not currently having any chest pain or shortness of breath. She took a full dose aspirin prior to leaving home. Exam notable for rales in the right base. Patient otherwise does not appear volume overloaded. EKG NSR, NSTWA, unchanged from prior. Workup revealed unremarkable chest x-ray, CBC, troponin. BNP was mildly elevated. I think patient's symptoms are likely associated with her CHF. Review of records indicates she had a negative Lexiscan last year. I  have very low suspicion for ACS at this time. We will give her IV dose of Lasix and instruct her to start an increased dose of her Lasix and follow closely with her PCP. Clinical Impression: 1. Chronic diastolic heart failure   2. Chest pain   3. Chest pain, unspecified chest pain type     Disposition: Discharge  Condition: Good  I have discussed the results, Dx and Tx plan with the pt(& family if present). He/she/they expressed understanding and agree(s) with the plan. Discharge instructions discussed at great length. Strict return precautions discussed and pt &/or family have verbalized understanding of the instructions. No further questions at time of discharge.    New Prescriptions   No medications on file    Follow Up: Cloyd Stagers, MD [place]  [address] [place] [address] [phone]  Schedule an appointment as soon as possible for a visit in 1 week   Cloyd Stagers, MD [place]  [address] [place] [address] [phone]  Schedule an appointment as soon as possible for a visit in 1 week   [place] [address] [id number] [place] [phone]  If symptoms worsen   Pt seen in conjunction with Dr. Carmin Muskrat, MD  Kirstie Peri, [facility] Resident - PGY-2    Kirstie Peri, MD 03/09/14 1806  Carmin Muskrat, MD 03/09/14 2335

## 2014-03-10 ENCOUNTER — Ambulatory Visit (INDEPENDENT_AMBULATORY_CARE_PROVIDER_SITE_OTHER): Payer: Medicare Other | Admitting: Neurology

## 2014-03-10 ENCOUNTER — Encounter: Payer: Self-pay | Admitting: Neurology

## 2014-03-10 VITALS — BP 96/58 | HR 53 | Ht 65.0 in | Wt 127.4 lb

## 2014-03-10 DIAGNOSIS — G501 Atypical facial pain: Secondary | ICD-10-CM | POA: Diagnosis not present

## 2014-03-10 NOTE — Patient Instructions (Addendum)
I had a long discussion with the patient with regards to her remote stroke, discussed risk of recurrent strokes, risks factor modification and answered questions. Continue aspirin for stroke prevention and strict control of lipids with LDL cholesterol goal below 100 mg percent. Patient continues to refuse medications for atypical facial pain. Return for follow-up in one year with Gilford Raid, nurse practitioner call earlier if necessary

## 2014-03-10 NOTE — Progress Notes (Signed)
[facility]  PATIENT: Morgan Cooper DOB: [birth year]   REASON FOR VISIT: follow up for atypical facial pain and history of silent  bicerebral infarcts    HISTORY OF PRESENT ILLNESS:Morgan Cooper , 69 -year-old white female returns for followup. She was last seen 10/24/12. 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 Remote history of bilateral small silent cerebellar infarcts. Vascular risk factors of HTN, Hyperlipidimia, and silent cerebral infarcts.  She reports improvement in her sharp shooting hemicranial neuralgic heeadaches which are not as frequent. She was placed on gabapentin low-dose at her last visit. She has stopped taking that because her facial pain is so infrequent. She had side effects to Topamax in the past. She says Topamax  made her feel funny She denies any episodes of loss of vision, jaw claudication or temporal tenderness. Lab work done for ESR, complement levels were normal. MRI scan of the brain done on 03/04/12 showed changes of chronic microvascular ischemia without any acute abnormality seen. She is currently wearing a Holter monitor . Last carotid doppler was 2012. She returns for reevaluation Update 03/10/2014 : She returns for follow-up after last visit 6 months ago. She continues to do well from neurovascular standpoint without recurrent TIA or stroke symptoms. She remains on aspirin and Lipitor which is tolerating well without any side effects. Patient does not remember when she had her last lipid profile checked. Her blood pressure appears to fluctuate but is generally not elevated. She has some occasional intermittent chest pain and in fact was seen for this in the emergency room recently and was diagnosed with COPD and CHF. Her dose of Lasix was increased. She continues to have intermittent right periorbital and facial typical  pain which lasted only a few seconds and occurs almost daily. She does not want any specific medicines for this. She does not find this to be disabling. She had follow-up carotid ultrasound done on 09/02/13 which have personally reviewed and shows no significant extracranial stenosis.  REVIEW OF SYSTEMS: Full 14 system review of systems performed and notable only for those listed, all others are neg: Fatigue, blurred and double vision, shortness of breath, chest tightness, chest pain, leg swelling, murmur, frequent infections, joint and back pain, aching muscles, neck pain and stiffness, 18, dizziness, memory loss, headache, numbness, speech difficulty, weakness, agitation, confusion, decreased concentration, depression, anxiety and nervousness.  ALLERGIES: No Known Allergies  HOME MEDICATIONS: Outpatient Prescriptions Prior to Visit  Medication Sig Dispense Refill  . ammonium lactate (LAC-HYDRIN) 12 % lotion Apply 1 application topically daily as needed for dry skin.     Marland Kitchen aspirin 325 MG tablet Take 325 mg by mouth daily.    Marland Kitchen atorvastatin (LIPITOR) 20 MG tablet Take 20 mg by mouth daily.     Marland Kitchen azaTHIOprine (IMURAN) 50 MG tablet Take 50 mg by mouth daily.     . cycloSPORINE (RESTASIS) 0.05 % ophthalmic emulsion Place 1 drop into both eyes 2 (two) times daily.      Marland Kitchen FLUoxetine (PROZAC) 20 MG capsule Take 20 mg by mouth daily.    . furosemide (LASIX) 20 MG tablet Take 20-40 mg by mouth daily.     Marland Kitchen HYDROcodone-acetaminophen (NORCO) 10-325 MG per tablet Take 1 tablet by mouth every 6 (six) hours as needed. For pain    . metoprolol (LOPRESSOR) 50 MG tablet Take 1 tablet (50 mg total) by mouth  2 (two) times daily. (Patient taking differently: Take 50 mg by mouth daily as needed. ) 60 tablet 1  . omeprazole (PRILOSEC) 20 MG capsule Take 20 mg by mouth 2 (two) times daily.     . QUEtiapine (SEROQUEL) 100 MG tablet Take 100 mg by mouth at bedtime. Taking 2 tablets QHS    . Albuterol Sulfate (PROAIR  RESPICLICK) 329 (90 BASE) MCG/ACT AEPB Inhale 2 puffs into the lungs every 4 (four) hours as needed. (Patient not taking: Reported on 03/09/2014) 1 each 0  . albuterol (PROVENTIL HFA;VENTOLIN HFA) 108 (90 BASE) MCG/ACT inhaler Inhale 1 puff into the lungs every 6 (six) hours as needed for shortness of breath.    . predniSONE (DELTASONE) 5 MG tablet Take 7.5 mg by mouth daily.     Marland Kitchen trimethoprim (TRIMPEX) 100 MG tablet Take 100 mg by mouth at bedtime.      No facility-administered medications prior to visit.    PAST MEDICAL HISTORY: Past Medical History  Diagnosis Date  . Renal failure   . Diverticulitis of large intestine with perforation Hx colectomy  . Hypertension   . Barrett's esophagus   . Hyperlipemia   . Esophageal stricture   . CVA (cerebral infarction)     Mini Strokes   . Pancreatic cyst   . Gastroparesis   . Depression   . Heart failure, diastolic, chronic   . Persistent headaches   . Rapid heart rate     some times  . GERD (gastroesophageal reflux disease)   . Forgetfulness   . Irritable   . Sleep difficulties   . Bone pain   . Osteoporosis   . Fatigue   . Disturbed concentration   . Itch     skin  . Nausea     little vomitting  . TIA (transient ischemic attack)   . Macular degeneration   . Keratosis   . Anxiety   . Panic attacks   . Bruises easily   . Chills   . Generalized headaches   . Stroke   . Hx of cardiovascular stress test     Lexiscan Myoview (06/2013):  No ischemia, EF 72%; Low Risk  . CHF (congestive heart failure)     PAST SURGICAL HISTORY: Past Surgical History  Procedure Laterality Date  . Kidney transplant  1984  . Appendectomy    . Colon resection  2010    Colostomy  . Colostomy  2011    Reversal   . Colostomy reversal    . Left arm surgery      due to injury  . Cataract surgery bilateral    . Esophageal dilation    . Parathyroidectomy  11/04/2010    left inferior parathyroid    FAMILY HISTORY: Family History  Problem  Relation Age of Onset  . Lung cancer Sister   . Cancer    . Heart disease Mother   . Stroke Father   . Aneurysm Brother   . Colon cancer Maternal Aunt 45  . Esophageal cancer Neg Hx   . Rectal cancer Neg Hx   . Stomach cancer Neg Hx     SOCIAL HISTORY: History   Social History  . Marital Status: Widowed    Spouse Name: N/A    Number of Children: 2  . Years of Education: 12   Occupational History  . Retired    Social History Main Topics  . Smoking status: Former Smoker -- 1.00 packs/day for 5 years    Types:  Cigarettes    Quit date: 05/30/1989  . Smokeless tobacco: Never Used  . Alcohol Use: No  . Drug Use: No  . Sexual Activity: Not on file   Other Topics Concern  . Not on file   Social History Narrative   Patient is widowed with 2 children   Patient is right handed   Patient has a high school education   Patient drinks 3 cups daily           PHYSICAL EXAM  Filed Vitals:   03/10/14 1612  BP: 96/58  Pulse: 53  Height: 5' 5"  (1.651 m)  Weight: 127 lb 6.4 oz (57.788 kg)   Body mass index is 21.2 kg/(m^2). General: well developed, well nourished, frail appearing Caucasian lady in no evident distress  Head: head normocephalic and atraumatic. Oropharynx benign  Neck: supple with no carotid bruits  Cardiovascular: regular rate and rhythm, no murmurs  Skin: Large mole right cheek  Neurologic Exam  Mental Status: Awake and fully alert. Oriented to place and time. Follows all commands. Speech and language normal.  Cranial Nerves: Fundi not visualized Pupils equal, briskly reactive to light. Extraocular movements full without nystagmus. Visual fields full to confrontation. Hearing intact and symmetric to finger snap. Facial sensation intact. Face, tongue, palate move normally and symmetrically. Neck flexion and extension normal.  Motor: Normal bulk and tone. Normal strength in all tested extremity muscles.No focal weakness  Sensory.: intact to touch and pinprick  and vibratory.  Coordination: Rapid alternating movements normal in all extremities. Finger-to-nose and heel-to-shin performed accurately bilaterally.  Gait and Station: Arises from chair without difficulty. Stance is normal. Able to heel, toe and mildly unsteady with tandem walk.   Reflexes: 2+ and symmetric. Toes downgoing.   DIAGNOSTIC DATA (LABS, IMAGING, TESTING) - I reviewed patient records, labs, notes, testing and imaging myself where available.      Component Value Date/Time   NA 138 03/09/2014 1410   K 3.9 03/09/2014 1410   CL 106 03/09/2014 1410   CO2 21 03/09/2014 1410   GLUCOSE 105* 03/09/2014 1410   BUN 6 03/09/2014 1410   CREATININE 0.98 03/09/2014 1410   CALCIUM 9.6 03/09/2014 1410   CALCIUM 9.9 12/27/2010 1140   PROT 7.8 04/28/2013 1407   ALBUMIN 4.4 04/28/2013 1407   AST 20 04/28/2013 1407   ALT 14 04/28/2013 1407   ALKPHOS 61 04/28/2013 1407   BILITOT 0.6 04/28/2013 1407   GFRNONAA 58* 03/09/2014 1410   GFRAA 67* 03/09/2014 1410    Lab Results  Component Value Date   TSH 1.90 06/12/2013      ASSESSMENT AND PLAN  69 y.o. 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 Patient is not on medication and does not wish to be on medication. Remote history of silent cerebellar infarcts with vascular risk factors of hypertension and hyperlipidemia. I had a long discussion with the patient with regards to her remote stroke, discussed risk of recurrent strokes, risks factor modification and answered questions. Continue aspirin for stroke prevention and strict control of lipids with LDL cholesterol goal below 100 mg percent. Patient continues to refuse medications for atypical facial pain. Return for follow-up in one year with Gilford Raid, nurse practitioner call earlier if necessary Antony Contras, MD [facility] [address] [place],  [address] [phone]

## 2014-03-17 DIAGNOSIS — D631 Anemia in chronic kidney disease: Secondary | ICD-10-CM | POA: Diagnosis not present

## 2014-03-17 DIAGNOSIS — N185 Chronic kidney disease, stage 5: Secondary | ICD-10-CM | POA: Diagnosis not present

## 2014-03-17 DIAGNOSIS — E785 Hyperlipidemia, unspecified: Secondary | ICD-10-CM | POA: Diagnosis not present

## 2014-03-17 DIAGNOSIS — N2581 Secondary hyperparathyroidism of renal origin: Secondary | ICD-10-CM | POA: Diagnosis not present

## 2014-04-01 ENCOUNTER — Other Ambulatory Visit: Payer: Self-pay | Admitting: Interventional Cardiology

## 2014-04-02 NOTE — Telephone Encounter (Signed)
metoprolol (LOPRESSOR) 50 MG tabletTake 1 tablet (50 mg total) by mouth 2 (two) times daily. Jettie Booze, MD at 01/07/2014 3:28 PM  Your physician recommends that you continue on your current medications as directed. Please refer to the Current Medication list given to you today.

## 2014-04-08 DIAGNOSIS — I5032 Chronic diastolic (congestive) heart failure: Secondary | ICD-10-CM | POA: Diagnosis not present

## 2014-04-08 DIAGNOSIS — Z1389 Encounter for screening for other disorder: Secondary | ICD-10-CM | POA: Diagnosis not present

## 2014-04-08 DIAGNOSIS — N3010 Interstitial cystitis (chronic) without hematuria: Secondary | ICD-10-CM | POA: Diagnosis not present

## 2014-04-08 DIAGNOSIS — Z940 Kidney transplant status: Secondary | ICD-10-CM | POA: Diagnosis not present

## 2014-04-08 DIAGNOSIS — F319 Bipolar disorder, unspecified: Secondary | ICD-10-CM | POA: Diagnosis not present

## 2014-04-08 DIAGNOSIS — K2270 Barrett's esophagus without dysplasia: Secondary | ICD-10-CM | POA: Diagnosis not present

## 2014-04-08 DIAGNOSIS — M810 Age-related osteoporosis without current pathological fracture: Secondary | ICD-10-CM | POA: Diagnosis not present

## 2014-04-08 DIAGNOSIS — Z23 Encounter for immunization: Secondary | ICD-10-CM | POA: Diagnosis not present

## 2014-04-08 DIAGNOSIS — Z0000 Encounter for general adult medical examination without abnormal findings: Secondary | ICD-10-CM | POA: Diagnosis not present

## 2014-04-09 ENCOUNTER — Encounter: Payer: Self-pay | Admitting: Emergency Medicine

## 2014-04-09 ENCOUNTER — Ambulatory Visit (INDEPENDENT_AMBULATORY_CARE_PROVIDER_SITE_OTHER): Payer: Medicare Other | Admitting: Emergency Medicine

## 2014-04-09 VITALS — BP 122/64 | HR 51 | Ht 65.0 in | Wt 126.0 lb

## 2014-04-09 DIAGNOSIS — R0609 Other forms of dyspnea: Secondary | ICD-10-CM | POA: Diagnosis not present

## 2014-04-09 NOTE — Assessment & Plan Note (Signed)
Currently stable but she did not get much improvement when she used albuterol when necessary. We discussed possibly starting a scheduled bronchodilator but she would like to defer for now. He will keep her albuterol available to use. Handicap placard was requested and given. rov 12

## 2014-04-09 NOTE — Progress Notes (Signed)
   Subjective:    Patient ID: Morgan Cooper, female    DOB: [birth year], 69 y.o.   MRN: [id number]  HPI 69 yo former tobacco (2.5pk-yrs) , hx of HTN + diastolic CHF, s/p renal tx on imuran and pred 7.5 qd, GERD / Barrett's / esophageal stricture. She has been having SOB for several years, but certainly worse for the last few months. She has occasional CP both at rest and w exertion. Dr Irish Lack performed a low risk Lexiscan in 5/'15. Occasionally some wheeze. No real cough or sputum. She is on a higher dose metoprolol, starting 1 month ago. She has been tried on BD's in the distant past, not recently.   ROV 04/09/14 -- follow-up visit for dyspnea. She has mixed obstruction and restriction on her function testing so we tried albuterol when necessary to see if she would benefit. She used it about 10 times, didn't notice any real changes in her breathing. She is having back pain and leg pain that play a role.    Review of Systems  Constitutional: Negative for fever and unexpected weight change.  HENT: Positive for sinus pressure, sore throat and trouble swallowing. Negative for congestion, dental problem, ear pain, nosebleeds, postnasal drip, rhinorrhea and sneezing.   Eyes: Negative for redness and itching.  Respiratory: Positive for cough, chest tightness, shortness of breath and wheezing.   Cardiovascular: Positive for palpitations and leg swelling.  Gastrointestinal: Positive for nausea and vomiting.  Genitourinary: Negative for dysuria.  Musculoskeletal: Positive for arthralgias. Negative for joint swelling.  Skin: Negative for rash.  Neurological: Positive for headaches.  Hematological: Does not bruise/bleed easily.  Psychiatric/Behavioral: Positive for dysphoric mood. The patient is nervous/anxious.        Objective:   Physical Exam Filed Vitals:   04/09/14 1451  BP: 122/64  Pulse: 51  Height: 5\' 5"  (1.651 m)  Weight: 126 lb (57.153 kg)  SpO2: 95%   Gen: Pleasant, well-nourished,  in no distress,  normal affect  ENT: No lesions,  mouth clear,  oropharynx clear, no postnasal drip  Neck: No JVD, no TMG, no carotid bruits  Lungs: No use of accessory muscles, clear without rales or rhonchi  Cardiovascular: RRR, heart sounds normal, no murmur or gallops, no peripheral edema  Musculoskeletal: No deformities, no cyanosis or clubbing  Neuro: alert, non focal  Skin: Warm, no lesions or rashes      Assessment & Plan:  Dyspnea on exertion Currently stable but she did not get much improvement when she used albuterol when necessary. We discussed possibly starting a scheduled bronchodilator but she would like to defer for now. He will keep her albuterol available to use. Handicap placard was requested and given. rov 12

## 2014-04-09 NOTE — Patient Instructions (Signed)
We will not start any new breathing medicines at this time Please continue to use albuterol 2 puffs if needed for shortness of breath Follow with Dr Lamonte Sakai in 12 months or sooner if you have any problems

## 2014-04-17 DIAGNOSIS — Z79891 Long term (current) use of opiate analgesic: Secondary | ICD-10-CM | POA: Diagnosis not present

## 2014-04-17 DIAGNOSIS — G894 Chronic pain syndrome: Secondary | ICD-10-CM | POA: Diagnosis not present

## 2014-04-17 DIAGNOSIS — M5442 Lumbago with sciatica, left side: Secondary | ICD-10-CM | POA: Diagnosis not present

## 2014-04-17 DIAGNOSIS — Z79899 Other long term (current) drug therapy: Secondary | ICD-10-CM | POA: Diagnosis not present

## 2014-05-19 DIAGNOSIS — F330 Major depressive disorder, recurrent, mild: Secondary | ICD-10-CM | POA: Diagnosis not present

## 2014-05-20 ENCOUNTER — Ambulatory Visit (INDEPENDENT_AMBULATORY_CARE_PROVIDER_SITE_OTHER): Payer: Medicare Other | Admitting: Gastroenterology

## 2014-05-20 ENCOUNTER — Encounter: Payer: Self-pay | Admitting: Gastroenterology

## 2014-05-20 VITALS — BP 138/90 | HR 64 | Ht 65.0 in | Wt 127.0 lb

## 2014-05-20 DIAGNOSIS — K2270 Barrett's esophagus without dysplasia: Secondary | ICD-10-CM

## 2014-05-20 DIAGNOSIS — D490 Neoplasm of unspecified behavior of digestive system: Secondary | ICD-10-CM

## 2014-05-20 NOTE — Patient Instructions (Signed)
It has been recommended to you by your physician that you have a(n) Endoscopy completed. Per your request, we did not schedule the procedure(s) today. Please contact our office at [phone] should you decide to have the procedure completed.

## 2014-05-20 NOTE — Assessment & Plan Note (Signed)
Asian has non-specific abdominal pain which could be related to neoplasm.  It is not palpable and, in the past, has been relatively stable in size.  Plan no further workup or therapy.

## 2014-05-20 NOTE — Assessment & Plan Note (Signed)
Plan follow-up endoscopy sometime this year

## 2014-05-20 NOTE — Progress Notes (Signed)
      History of Present Illness:  Ms. Astrid Vides return for follow-up of Barrett's esophagus.  Last examined 2013 did not show any dysplasia.  She denies pyrosis or dysphagia.    Review of Systems: Left neck and facial pain.  She also has back pain.  On occasion she has had crampy left lower quadrant discomfort associated with diarrhea.  Pertinent positive and negative review of systems were noted in the above HPI section. All other review of systems were otherwise negative.    Current Medications, Allergies, Past Medical History, Past Surgical History, Family History and Social History were reviewed in [facility] record  Vital signs were reviewed in today's medical record. Physical Exam: General: Well developed , well nourished, no acute distress Skin: anicteric Head: Normocephalic and atraumatic Eyes:  sclerae anicteric, EOMI Ears: Normal auditory acuity Mouth: No deformity or lesions Lungs: Clear throughout to auscultation Heart: Regular rate and rhythm; no murmurs, rubs or bruits Abdomen: Soft, non tender and non distended. No masses, hepatosplenomegaly or hernias noted. Normal Bowel sounds Rectal:deferred Musculoskeletal: Symmetrical with no gross deformities  Pulses:  Normal pulses noted Extremities: No clubbing, cyanosis, edema or deformities noted Neurological: Alert oriented x 4, grossly nonfocal Psychological:  Alert and cooperative. Normal mood and affect  See Assessment and Plan under Problem List

## 2014-06-05 DIAGNOSIS — R109 Unspecified abdominal pain: Secondary | ICD-10-CM | POA: Diagnosis not present

## 2014-06-05 DIAGNOSIS — N952 Postmenopausal atrophic vaginitis: Secondary | ICD-10-CM | POA: Diagnosis not present

## 2014-06-05 DIAGNOSIS — N390 Urinary tract infection, site not specified: Secondary | ICD-10-CM | POA: Diagnosis not present

## 2014-06-05 DIAGNOSIS — R350 Frequency of micturition: Secondary | ICD-10-CM | POA: Diagnosis not present

## 2014-07-15 ENCOUNTER — Inpatient Hospital Stay (HOSPITAL_COMMUNITY)
Admission: AD | Admit: 2014-07-15 | Discharge: 2014-07-19 | DRG: 690 | Disposition: A | Discharge status: Home / Self Care | Payer: Medicare Other | Source: Ambulatory Visit | Attending: Urology | Admitting: Urology

## 2014-07-15 ENCOUNTER — Other Ambulatory Visit: Payer: Self-pay | Admitting: Urology

## 2014-07-15 ENCOUNTER — Encounter (HOSPITAL_COMMUNITY): Payer: Self-pay | Admitting: *Deleted

## 2014-07-15 DIAGNOSIS — F410 Panic disorder [episodic paroxysmal anxiety] without agoraphobia: Secondary | ICD-10-CM | POA: Diagnosis present

## 2014-07-15 DIAGNOSIS — Z8673 Personal history of transient ischemic attack (TIA), and cerebral infarction without residual deficits: Secondary | ICD-10-CM

## 2014-07-15 DIAGNOSIS — Z7982 Long term (current) use of aspirin: Secondary | ICD-10-CM

## 2014-07-15 DIAGNOSIS — K760 Fatty (change of) liver, not elsewhere classified: Secondary | ICD-10-CM | POA: Diagnosis not present

## 2014-07-15 DIAGNOSIS — M810 Age-related osteoporosis without current pathological fracture: Secondary | ICD-10-CM | POA: Diagnosis present

## 2014-07-15 DIAGNOSIS — Z7952 Long term (current) use of systemic steroids: Secondary | ICD-10-CM

## 2014-07-15 DIAGNOSIS — IMO0001 Reserved for inherently not codable concepts without codable children: Secondary | ICD-10-CM | POA: Diagnosis present

## 2014-07-15 DIAGNOSIS — N12 Tubulo-interstitial nephritis, not specified as acute or chronic: Secondary | ICD-10-CM | POA: Diagnosis not present

## 2014-07-15 DIAGNOSIS — I10 Essential (primary) hypertension: Secondary | ICD-10-CM | POA: Diagnosis present

## 2014-07-15 DIAGNOSIS — Z823 Family history of stroke: Secondary | ICD-10-CM

## 2014-07-15 DIAGNOSIS — Z8249 Family history of ischemic heart disease and other diseases of the circulatory system: Secondary | ICD-10-CM

## 2014-07-15 DIAGNOSIS — B961 Klebsiella pneumoniae [K. pneumoniae] as the cause of diseases classified elsewhere: Secondary | ICD-10-CM | POA: Diagnosis present

## 2014-07-15 DIAGNOSIS — Z960 Presence of urogenital implants: Secondary | ICD-10-CM | POA: Diagnosis not present

## 2014-07-15 DIAGNOSIS — Z87440 Personal history of urinary (tract) infections: Secondary | ICD-10-CM

## 2014-07-15 DIAGNOSIS — Z940 Kidney transplant status: Secondary | ICD-10-CM | POA: Diagnosis not present

## 2014-07-15 DIAGNOSIS — Z801 Family history of malignant neoplasm of trachea, bronchus and lung: Secondary | ICD-10-CM | POA: Diagnosis not present

## 2014-07-15 DIAGNOSIS — K219 Gastro-esophageal reflux disease without esophagitis: Secondary | ICD-10-CM | POA: Diagnosis present

## 2014-07-15 DIAGNOSIS — Z9049 Acquired absence of other specified parts of digestive tract: Secondary | ICD-10-CM | POA: Diagnosis present

## 2014-07-15 DIAGNOSIS — N1330 Unspecified hydronephrosis: Secondary | ICD-10-CM | POA: Diagnosis not present

## 2014-07-15 DIAGNOSIS — I5032 Chronic diastolic (congestive) heart failure: Secondary | ICD-10-CM | POA: Diagnosis present

## 2014-07-15 DIAGNOSIS — Z1611 Resistance to penicillins: Secondary | ICD-10-CM | POA: Diagnosis present

## 2014-07-15 DIAGNOSIS — E785 Hyperlipidemia, unspecified: Secondary | ICD-10-CM | POA: Diagnosis present

## 2014-07-15 DIAGNOSIS — N10 Acute tubulo-interstitial nephritis: Secondary | ICD-10-CM | POA: Diagnosis not present

## 2014-07-15 DIAGNOSIS — B952 Enterococcus as the cause of diseases classified elsewhere: Secondary | ICD-10-CM | POA: Diagnosis present

## 2014-07-15 DIAGNOSIS — T8619 Other complication of kidney transplant: Secondary | ICD-10-CM

## 2014-07-15 DIAGNOSIS — K3184 Gastroparesis: Secondary | ICD-10-CM | POA: Diagnosis present

## 2014-07-15 DIAGNOSIS — N390 Urinary tract infection, site not specified: Secondary | ICD-10-CM | POA: Diagnosis not present

## 2014-07-15 DIAGNOSIS — K862 Cyst of pancreas: Secondary | ICD-10-CM | POA: Diagnosis present

## 2014-07-15 DIAGNOSIS — Z87891 Personal history of nicotine dependence: Secondary | ICD-10-CM

## 2014-07-15 DIAGNOSIS — Z880 Allergy status to penicillin: Secondary | ICD-10-CM

## 2014-07-15 DIAGNOSIS — Z79899 Other long term (current) drug therapy: Secondary | ICD-10-CM | POA: Diagnosis not present

## 2014-07-15 DIAGNOSIS — F419 Anxiety disorder, unspecified: Secondary | ICD-10-CM | POA: Diagnosis present

## 2014-07-15 DIAGNOSIS — H3530 Unspecified macular degeneration: Secondary | ICD-10-CM | POA: Diagnosis present

## 2014-07-15 DIAGNOSIS — N289 Disorder of kidney and ureter, unspecified: Secondary | ICD-10-CM | POA: Diagnosis not present

## 2014-07-15 DIAGNOSIS — K2270 Barrett's esophagus without dysplasia: Secondary | ICD-10-CM | POA: Diagnosis present

## 2014-07-15 DIAGNOSIS — R509 Fever, unspecified: Secondary | ICD-10-CM | POA: Diagnosis not present

## 2014-07-15 DIAGNOSIS — R300 Dysuria: Secondary | ICD-10-CM | POA: Diagnosis not present

## 2014-07-15 DIAGNOSIS — R109 Unspecified abdominal pain: Secondary | ICD-10-CM | POA: Diagnosis not present

## 2014-07-15 DIAGNOSIS — N261 Atrophy of kidney (terminal): Secondary | ICD-10-CM | POA: Diagnosis not present

## 2014-07-15 LAB — CBC WITH DIFFERENTIAL/PLATELET
BASOS PCT: 0 % (ref 0–1)
Basophils Absolute: 0 10*3/uL (ref 0.0–0.1)
Eosinophils Absolute: 0 10*3/uL (ref 0.0–0.7)
Eosinophils Relative: 0 % (ref 0–5)
HEMATOCRIT: 34 % — AB (ref 36.0–46.0)
Hemoglobin: 11.1 g/dL — ABNORMAL LOW (ref 12.0–15.0)
LYMPHS ABS: 0.5 10*3/uL — AB (ref 0.7–4.0)
LYMPHS PCT: 6 % — AB (ref 12–46)
MCH: 28.8 pg (ref 26.0–34.0)
MCHC: 32.6 g/dL (ref 30.0–36.0)
MCV: 88.3 fL (ref 78.0–100.0)
MONO ABS: 1.2 10*3/uL — AB (ref 0.1–1.0)
MONOS PCT: 13 % — AB (ref 3–12)
Neutro Abs: 7 10*3/uL (ref 1.7–7.7)
Neutrophils Relative %: 81 % — ABNORMAL HIGH (ref 43–77)
Platelets: 223 10*3/uL (ref 150–400)
RBC: 3.85 MIL/uL — ABNORMAL LOW (ref 3.87–5.11)
RDW: 15.7 % — ABNORMAL HIGH (ref 11.5–15.5)
WBC: 8.7 10*3/uL (ref 4.0–10.5)

## 2014-07-15 LAB — COMPREHENSIVE METABOLIC PANEL
ALBUMIN: 3.5 g/dL (ref 3.5–5.0)
ALT: 13 U/L — AB (ref 14–54)
ANION GAP: 9 (ref 5–15)
AST: 20 U/L (ref 15–41)
Alkaline Phosphatase: 56 U/L (ref 38–126)
BUN: 12 mg/dL (ref 6–20)
CO2: 25 mmol/L (ref 22–32)
Calcium: 8.9 mg/dL (ref 8.9–10.3)
Chloride: 100 mmol/L — ABNORMAL LOW (ref 101–111)
Creatinine, Ser: 0.85 mg/dL (ref 0.44–1.00)
GFR calc Af Amer: >60 mL/min (ref >60)
GFR calc non Af Amer: >60 mL/min (ref >60)
Glucose, Bld: 115 mg/dL — ABNORMAL HIGH (ref 65–99)
Potassium: 3.7 mmol/L (ref 3.5–5.1)
Sodium: 134 mmol/L — ABNORMAL LOW (ref 135–145)
Total Bilirubin: 0.8 mg/dL (ref 0.3–1.2)
Total Protein: 7.3 g/dL (ref 6.5–8.1)

## 2014-07-15 LAB — LACTIC ACID, PLASMA: LACTIC ACID, VENOUS: 1.2 mmol/L (ref 0.5–2.0)

## 2014-07-15 MED ORDER — ENOXAPARIN SODIUM 30 MG/0.3ML ~~LOC~~ SOLN
30.0000 mg | SUBCUTANEOUS | Status: DC
  Administered 2014-07-15 – 2014-07-18 (×4): 30 mg via SUBCUTANEOUS
  Filled 2014-07-15 (×4): qty 0.3

## 2014-07-15 MED ORDER — OXYCODONE HCL 5 MG PO TABS
5.0000 mg | ORAL_TABLET | ORAL | Status: DC | PRN
  Administered 2014-07-15 – 2014-07-19 (×17): 5 mg via ORAL
  Filled 2014-07-15 (×19): qty 1

## 2014-07-15 MED ORDER — GENTAMICIN SULFATE 40 MG/ML IJ SOLN
7.0000 mg/kg | Freq: Once | INTRAVENOUS | Status: AC
  Administered 2014-07-15: 400 mg via INTRAVENOUS
  Filled 2014-07-15: qty 10

## 2014-07-15 MED ORDER — DOCUSATE SODIUM 100 MG PO CAPS
100.0000 mg | ORAL_CAPSULE | Freq: Two times a day (BID) | ORAL | Status: DC
  Administered 2014-07-15 – 2014-07-19 (×5): 100 mg via ORAL
  Filled 2014-07-15 (×8): qty 1

## 2014-07-15 MED ORDER — CETYLPYRIDINIUM CHLORIDE 0.05 % MT LIQD
7.0000 mL | Freq: Two times a day (BID) | OROMUCOSAL | Status: DC
  Administered 2014-07-15 – 2014-07-19 (×8): 7 mL via OROMUCOSAL

## 2014-07-15 MED ORDER — ACETAMINOPHEN 325 MG PO TABS
650.0000 mg | ORAL_TABLET | ORAL | Status: DC | PRN
  Administered 2014-07-15 – 2014-07-18 (×7): 650 mg via ORAL
  Filled 2014-07-15 (×7): qty 2

## 2014-07-15 MED ORDER — SODIUM CHLORIDE 0.45 % IV SOLN
INTRAVENOUS | Status: DC
  Administered 2014-07-15 – 2014-07-16 (×2): via INTRAVENOUS
  Administered 2014-07-17: 75 mL/h via INTRAVENOUS
  Administered 2014-07-18: via INTRAVENOUS

## 2014-07-15 MED ORDER — ONDANSETRON HCL 4 MG/2ML IJ SOLN
4.0000 mg | INTRAMUSCULAR | Status: DC | PRN
  Administered 2014-07-16 – 2014-07-19 (×9): 4 mg via INTRAVENOUS
  Filled 2014-07-15 (×9): qty 2

## 2014-07-15 MED ORDER — GENTAMICIN SULFATE 40 MG/ML IJ SOLN: 7.0000 mg/kg | INTRAVENOUS | Status: DC

## 2014-07-15 MED ORDER — CHLORHEXIDINE GLUCONATE 0.12 % MT SOLN
15.0000 mL | Freq: Two times a day (BID) | OROMUCOSAL | Status: DC
  Administered 2014-07-15 – 2014-07-19 (×6): 15 mL via OROMUCOSAL
  Filled 2014-07-15 (×6): qty 15

## 2014-07-15 MED ORDER — ZOLPIDEM TARTRATE 5 MG PO TABS
5.0000 mg | ORAL_TABLET | Freq: Every evening | ORAL | Status: DC | PRN
  Administered 2014-07-15 – 2014-07-18 (×3): 5 mg via ORAL
  Filled 2014-07-15 (×3): qty 1

## 2014-07-15 NOTE — H&P (Signed)
H&P  Chief Complaint: fever, UTI  History of Present Illness:  This woman has a history of recurrent urinary tract infections. She has a history of renal failure and is status post living related renal transplant in 1984.     She last saw Dr. Junious Silk about a month ago for one that was enterococcus and treated appropriately with nitrofurantoin. She comes in today with fever, shakes, chills, and some left lower quadrant pain. She does have a left-sided pelvic kidney. She relates to having pneumaturia rarely in the past, but none recently. She was scheduled for an office visit and a renal ultrasound with Dr. Junious Silk tomorrow. Her temperature did get up to 102. She's had no gross hematuria, but has frequency, urgency and dysuria. She also has left lower quadrant discomfort and a headache today.    Past Medical History  Diagnosis Date  . Renal failure   . Diverticulitis of large intestine with perforation Hx colectomy  . Hypertension   . Barrett's esophagus   . Hyperlipemia   . Esophageal stricture   . CVA (cerebral infarction)     Mini Strokes   . Pancreatic cyst   . Gastroparesis   . Depression   . Heart failure, diastolic, chronic   . Persistent headaches   . Rapid heart rate     some times  . GERD (gastroesophageal reflux disease)   . Forgetfulness   . Irritable   . Sleep difficulties   . Bone pain   . Osteoporosis   . Fatigue   . Disturbed concentration   . Itch     skin  . Nausea     little vomitting  . TIA (transient ischemic attack)   . Macular degeneration   . Keratosis   . Anxiety   . Panic attacks   . Bruises easily   . Chills   . Generalized headaches   . Stroke   . Hx of cardiovascular stress test     Lexiscan Myoview (06/2013):  No ischemia, EF 72%; Low Risk  . CHF (congestive heart failure)     Past Surgical History  Procedure Laterality Date  . Kidney transplant  1984  . Appendectomy    . Colon resection  2010    Colostomy  . Colostomy  2011    Reversal   . Colostomy reversal    . Left arm surgery      due to injury  . Cataract surgery bilateral    . Esophageal dilation    . Parathyroidectomy  11/04/2010    left inferior parathyroid    Home Medications:  Medications Prior to Admission  Medication Sig Dispense Refill  . Albuterol Sulfate (PROAIR RESPICLICK) 283 (90 BASE) MCG/ACT AEPB Inhale 2 puffs into the lungs every 4 (four) hours as needed. 1 each 0  . ammonium lactate (LAC-HYDRIN) 12 % lotion Apply 1 application topically daily as needed for dry skin.     Marland Kitchen aspirin 325 MG tablet Take 325 mg by mouth daily.    Marland Kitchen atorvastatin (LIPITOR) 20 MG tablet Take 20 mg by mouth daily.     Marland Kitchen azaTHIOprine (IMURAN) 50 MG tablet Take 50 mg by mouth daily.     . cycloSPORINE (RESTASIS) 0.05 % ophthalmic emulsion Place 1 drop into both eyes 2 (two) times daily.      Marland Kitchen ESTRACE VAGINAL 0.1 MG/GM vaginal cream     . FLUoxetine (PROZAC) 20 MG capsule Take 20 mg by mouth daily.    . furosemide (LASIX) 20  MG tablet Take 20-40 mg by mouth daily.     Marland Kitchen HYDROcodone-acetaminophen (NORCO) 10-325 MG per tablet Take 1 tablet by mouth every 6 (six) hours as needed. For pain    . metoprolol (LOPRESSOR) 50 MG tablet TAKE ONE TABLET TWICE DAILY 60 tablet 8  . omeprazole (PRILOSEC) 20 MG capsule Take 20 mg by mouth 2 (two) times daily.     . predniSONE (DELTASONE) 5 MG tablet     . QUEtiapine (SEROQUEL) 100 MG tablet Take 100 mg by mouth at bedtime. Taking 2 tablets QHS      Allergies:  Allergies  Allergen Reactions  . Amoxicillin Diarrhea    Family History  Problem Relation Age of Onset  . Lung cancer Sister   . Cancer    . Heart disease Mother   . Stroke Father   . Aneurysm Brother   . Colon cancer Maternal Aunt 66  . Esophageal cancer Neg Hx   . Rectal cancer Neg Hx   . Stomach cancer Neg Hx     Social History:  reports that she quit smoking about 25 years ago. Her smoking use included Cigarettes. She has a 5 pack-year smoking history.  She has never used smokeless tobacco. She reports that she does not drink alcohol or use illicit drugs.  ROS: Genitourinary: urinary frequency, urinary urgency, dysuria, nocturia and cloudy urine.  Gastrointestinal: abdominal pain.  Constitutional: fever, fever with temperature of 102 taken at home and chills.   Physical Exam:  Vital signs in last 24 hours: Temp:  [102.6 F (39.2 C)-103 F (39.4 C)] 103 F (39.4 C) (05/25 1725) Pulse Rate:  [86] 86 (05/25 1639) Resp:  [20] 20 (05/25 1639) BP: (146)/(64) 146/64 mmHg (05/25 1639) SpO2:  [95 %] 95 % (05/25 1639) Weight:  [56.564 kg (124 lb 11.2 oz)] 56.564 kg (124 lb 11.2 oz) (05/25 1639) General:  Alert and oriented, she appeared to be in moderate distress with nausea and vomiting. HEENT: Normocephalic, atraumatic Neck: No JVD or lymphadenopathy Cardiovascular: Regular rate and rhythm Lungs: Clear bilaterally Abdomen: Soft, left lower quadrant tenderness.  No rebound or guarding. Back: No CVA tenderness Extremities: No edema Neurologic: Grossly intact  Laboratory Data:  Urinalysis appears infected. Bilateral/transplant ultrasound performed today. Both native kidneys are atrophic without evidence of hydronephrosis or perinephric fluid. Renal lengths proximally 6.7 cm on the right, 6.3 on the left. No evidence of renal calculi. Transplant kidney was 13.2 cm in length. There was moderate hydronephrosis, which when compared to prior ultrasound was greater in nature. No renal masses are noted. I do not see evidence of calculi.  Creatinine: No results for input(s): CREATININE in the last 168 hours.  Radiologic Imaging: No results found.  Impression/Assessment:  1.  Urinary tract infection/pyelonephritis, recurrent.  2.  Hydronephrosis of transplant kidney  Plan:  1.  Patient was given Phenergan as well as 1 g of Rocephin in the office.  2.  Her urine was cultured today in our office.  3.  I will admit the patient for hydration,  IV antibiotics, and to initiate further evaluation of her increasing renal transplant hydronephrosis  Jorja Loa 07/15/2014, 5:27 PM  Lillette Boxer. Emmely Bittinger MD

## 2014-07-15 NOTE — Progress Notes (Signed)
ANTIBIOTIC CONSULT NOTE - INITIAL  Pharmacy Consult for Gentamicin Indication: UTI  Allergies  Allergen Reactions  . Amoxicillin Diarrhea    Patient Measurements:   Adjusted Body Weight:   Vital Signs: Temp: 102.6 F (39.2 C) (05/25 1639) Temp Source: Oral (05/25 1639) BP: 146/64 mmHg (05/25 1639) Pulse Rate: 86 (05/25 1639) Intake/Output from previous day:   Intake/Output from this shift:    Labs: No results for input(s): WBC, HGB, PLT, LABCREA, CREATININE in the last 72 hours. CrCl cannot be calculated (Unknown ideal weight.). No results for input(s): VANCOTROUGH, VANCOPEAK, VANCORANDOM, GENTTROUGH, GENTPEAK, GENTRANDOM, TOBRATROUGH, TOBRAPEAK, TOBRARND, AMIKACINPEAK, AMIKACINTROU, AMIKACIN in the last 72 hours.   Microbiology: No results found for this or any previous visit (from the past 720 hour(s)).  Medical History: Past Medical History  Diagnosis Date  . Renal failure   . Diverticulitis of large intestine with perforation Hx colectomy  . Hypertension   . Barrett's esophagus   . Hyperlipemia   . Esophageal stricture   . CVA (cerebral infarction)     Mini Strokes   . Pancreatic cyst   . Gastroparesis   . Depression   . Heart failure, diastolic, chronic   . Persistent headaches   . Rapid heart rate     some times  . GERD (gastroesophageal reflux disease)   . Forgetfulness   . Irritable   . Sleep difficulties   . Bone pain   . Osteoporosis   . Fatigue   . Disturbed concentration   . Itch     skin  . Nausea     little vomitting  . TIA (transient ischemic attack)   . Macular degeneration   . Keratosis   . Anxiety   . Panic attacks   . Bruises easily   . Chills   . Generalized headaches   . Stroke   . Hx of cardiovascular stress test     Lexiscan Myoview (06/2013):  No ischemia, EF 72%; Low Risk  . CHF (congestive heart failure)     Assessment: 71 yoF with hx of kidney transplant currently on imuran and prednisone is direct admit from  urology office with fever, pyelonephritis/ recurrent UTI and hydronephrosis.  PMHx also includes GERD / Barrett's / esophageal stricture, CHF, CVA, right hemicranial HAs and neurologic pain.  Pharmacy consulted to dose gentamicin.  Note recent enterococcus UTI treated with nitrofurantoin. U/A from urology office appears infected per MD note.   Allergies: Amoxicillin (diarrhea) Wt 124.7lb  Tm24h: 102.6 Renal: SCr 0.85, CrCl 56 ml/min (N70) WBC: WNL  Goal of Therapy:  Gentamicin trough level <2 mcg/ml  Plan:  Start gentamicin 7mg /kg q24h.  Pt at risk for renal impairment therefore will check level after 1st dose.  F/u renal function and clinical course closely.  Ralene Bathe, PharmD, BCPS 07/15/2014, 6:17 PM  Pager: [phone]

## 2014-07-16 ENCOUNTER — Inpatient Hospital Stay (HOSPITAL_COMMUNITY): Payer: Medicare Other

## 2014-07-16 ENCOUNTER — Encounter (HOSPITAL_COMMUNITY): Payer: Self-pay | Admitting: Radiology

## 2014-07-16 LAB — GENTAMICIN LEVEL, RANDOM: Gentamicin Rm: 5.8 ug/mL

## 2014-07-16 MED ORDER — PREDNISONE 5 MG PO TABS
7.5000 mg | ORAL_TABLET | Freq: Every day | ORAL | Status: DC
  Administered 2014-07-16 – 2014-07-19 (×4): 7.5 mg via ORAL
  Filled 2014-07-16 (×6): qty 1.5

## 2014-07-16 MED ORDER — GENTAMICIN SULFATE 40 MG/ML IJ SOLN: 7.0000 mg/kg | INTRAVENOUS | Status: DC

## 2014-07-16 MED ORDER — AZATHIOPRINE 50 MG PO TABS
50.0000 mg | ORAL_TABLET | Freq: Every day | ORAL | Status: DC
  Administered 2014-07-16 – 2014-07-19 (×4): 50 mg via ORAL
  Filled 2014-07-16 (×4): qty 1

## 2014-07-16 MED ORDER — CEFTRIAXONE SODIUM IN DEXTROSE 20 MG/ML IV SOLN
1.0000 g | INTRAVENOUS | Status: DC
  Administered 2014-07-16 – 2014-07-17 (×2): 1 g via INTRAVENOUS
  Filled 2014-07-16 (×2): qty 50

## 2014-07-16 MED ORDER — SODIUM CHLORIDE 0.9 % IV SOLN
2.0000 g | Freq: Four times a day (QID) | INTRAVENOUS | Status: DC
  Administered 2014-07-16: 2 g via INTRAVENOUS
  Filled 2014-07-16 (×2): qty 2000

## 2014-07-16 MED ORDER — VANCOMYCIN HCL IN DEXTROSE 1-5 GM/200ML-% IV SOLN
1000.0000 mg | INTRAVENOUS | Status: DC
  Administered 2014-07-16 – 2014-07-17 (×2): 1000 mg via INTRAVENOUS
  Filled 2014-07-16 (×2): qty 200

## 2014-07-16 NOTE — Progress Notes (Addendum)
ANTIBIOTIC CONSULT NOTE - FOLLOW UP  Pharmacy Consult for Gentamicin + Ampicillin >> Vancomycin + Ceftriaxone Indication: UTI  Allergies  Allergen Reactions  . Amoxicillin Diarrhea    Patient Measurements: Height: 5\' 5"  (165.1 cm) Weight: 124 lb 11.2 oz (56.564 kg) IBW/kg (Calculated) : 57  Vital Signs: Temp: 100 F (37.8 C) (05/26 0641) Temp Source: Oral (05/26 0409) BP: 151/58 mmHg (05/26 0409) Pulse Rate: 102 (05/26 0409) Intake/Output from previous day: 05/25 0701 - 05/26 0700 In: 966.3 [I.V.:966.3] Out: 200 [Urine:200] Intake/Output from this shift: Total I/O In: -  Out: 301 [Urine:300; Stool:1]  Labs:  Recent Labs  07/15/14 1724  WBC 8.7  HGB 11.1*  PLT 223  CREATININE 0.85   Estimated Creatinine Clearance: 55.8 mL/min (by C-G formula based on Cr of 0.85).  Recent Labs  07/16/14 0416  GENTRANDOM 5.8     Microbiology: No results found for this or any previous visit (from the past 720 hour(s)).  Anti-infectives    Start     Dose/Rate Route Frequency Ordered Stop   07/16/14 1800  gentamicin (GARAMYCIN) 400 mg in dextrose 5 % 100 mL IVPB     7 mg/kg  56.6 kg 110 mL/hr over 60 Minutes Intravenous Every 24 hours 07/15/14 1821     07/15/14 1830  gentamicin (GARAMYCIN) 400 mg in dextrose 5 % 100 mL IVPB     7 mg/kg  56.6 kg (Adjusted) 110 mL/hr over 60 Minutes Intravenous  Once 07/15/14 1818 07/15/14 2039      Assessment: 54 yoF with hx of kidney transplant currently on imuran and prednisone is direct admit from urology office with fever, pyelonephritis/ recurrent UTI and hydronephrosis. PMHx also includes GERD / Barrett's / esophageal stricture, CHF, CVA, right hemicranial HAs and neurologic pain. Pharmacy consulted to dose gentamicin. Note recent enterococcus UTI treated with nitrofurantoin, also sensitive to amp, vanc, FQs.  U/A from urology office appears infected per MD note.    5/26: MD would like to escalate abx today given continued fevers,  nausea, chills.  Added ampicillin this morning; now would like to switch to vanc/ceftriaxone to keep patient off of gentamicin given renal txplant   5/25 >> ceftriaxone x 1 >> resumed 5/26 >> 5/25 >> gentamicin >> 5/26 5/26 >> ampicillin >> 5/26 5/26 >> vancomycin >>  Temps to 103 last night, now lower at 100F Renal: SCr 0.85, CrCl 56 ml/min (N70) WBC: WNL  5/25: U/A, UCx drawn at [facility] [phone]); will not show up in Epic  Drug levels/dose changes 5/26: 0416 Rm Gent level = 5.8, drawn 8.5 hrs after start of infusion  Goal of Therapy:  Vancomycin trough level 10-15 mcg/ml Eradication of infection Appropriate antibiotic dosing for indication and renal function  Plan:  Day 2 antibiotics  Vancomycin 1000 mg IV q24 hr  Measure vancomycin trough levels as appropriate  Ceftriaxone 1 g IV q24 hr  Follow clinical course, renal function, culture results as available.    Follow for de-escalation of antibiotics and LOT   Reuel Boom, PharmD Pager: [phone] 07/16/2014, 12:48 PM

## 2014-07-16 NOTE — Progress Notes (Addendum)
  Subjective: Patient reports nausea, fever, chills.   Objective: Vital signs in last 24 hours: Temp:  [100 F (37.8 C)-103 F (39.4 C)] 100 F (37.8 C) (05/26 0641) Pulse Rate:  [69-102] 102 (05/26 0409) Resp:  [20] 20 (05/26 0409) BP: (133-151)/(58-70) 151/58 mmHg (05/26 0409) SpO2:  [93 %-98 %] 93 % (05/26 0409) Weight:  [56.564 kg (124 lb 11.2 oz)] 56.564 kg (124 lb 11.2 oz) (05/25 1639)  Intake/Output from previous day: 05/25 0701 - 05/26 0700 In: 966.3 [I.V.:966.3] Out: 200 [Urine:200] Intake/Output this shift: Total I/O In: -  Out: 301 [Urine:300; Stool:1]  Physical Exam:  NAD Resting in bed  Lab Results:  Recent Labs  07/15/14 1724  HGB 11.1*  HCT 34.0*   BMET  Recent Labs  07/15/14 1724  NA 134*  K 3.7  CL 100*  CO2 25  GLUCOSE 115*  BUN 12  CREATININE 0.85  CALCIUM 8.9   No results for input(s): LABPT, INR in the last 72 hours. No results for input(s): LABURIN in the last 72 hours. Results for orders placed or performed in visit on 10/27/10  Surgical pcr screen     Status: None   Collection Time: 10/27/10 11:20 AM  Result Value Ref Range Status   MRSA, PCR NEGATIVE NEGATIVE Final   Staphylococcus aureus NEGATIVE NEGATIVE Final    Comment:        The Xpert SA Assay (FDA approved for NASAL specimens only), is one component of a comprehensive surveillance program.  It is not intended to diagnose infection nor to guide or monitor treatment.    Studies/Results: No results found.  Assessment/Plan:  Pyelonephritis - add rocephin to gent. Cx pending. Addendum-->  I spoke with the pharmacist.  Patient with diarrhea on amoxicillin.  Therefore we will add ampicillin to [facility] for now. I am okay with other antibiotic based on ID Rec.  Kidney transplant - nephrology consult , review meds, ivf, kidney fxn  Nausea - zofran, promethazine  Hydronephrosis of renal transplant - this appears stable to me comparing back to CT as far as 2010. Likely  reflux related. Will assess with non-con CT to better visualize ureter and rule out stone although unlikely with normal renal fxn.    LOS: 1 day   Morgan Cooper 07/16/2014, 8:16 AM

## 2014-07-16 NOTE — Consult Note (Signed)
Morgan Cooper Admit Date: 07/15/2014 07/16/2014 Rexene Agent Requesting Physician:  Junious Silk MD  Reason for Consult:  comanagement of kidney transplant patient HPI:   69 year old female seen at the request of Dr. Junious Silk for the above issues. She has a history of living related donor kidney transplant in 1984 currently maintained on azathioprine and prednisone. Her baseline creatinine is 0.8-0.9. She has had chronic urinary tract infections and follows with Dr. Junious Silk , with her most recent infection on 06/09/14 demonstrating enterococcus sensitive to levofloxacin, ampicillin, vancomycin and resistant to tetracycline's. She has had previous urine cultures with similar species and sensitivities. She was seen at the [facility] office yesterday with fever or chills and concern for bladder infection. She received  Ceftriaxone at the office and then was admitted. She was initially placed on gentamicin as well and ampicillin was added this morning.  Currently she has some discomfort and nausea.  Renal ultrasound obtained yesterday demonstrated the transplant kidney being 13.27 m in length with moderate hydronephrosis which appeared grater than previous studies however has been present before.   CREATININE, SER (mg/dL)  Date Value  07/15/2014 0.85  03/09/2014 0.98  07/29/2013 0.9  06/12/2013 0.9  04/28/2013 1.1  12/20/2011 0.82  07/08/2011 0.73  10/27/2010 0.73  03/07/2009 0.60  03/06/2009 0.63  ] I/Os:  ROS NSAIDS:  IV Contrast  TMP/SMX  Hypotension  Balance of 12 systems is negative w/ exceptions as above  PMH  Past Medical History  Diagnosis Date  . Renal failure   . Diverticulitis of large intestine with perforation Hx colectomy  . Hypertension   . Barrett's esophagus   . Hyperlipemia   . Esophageal stricture   . CVA (cerebral infarction)     Mini Strokes   . Pancreatic cyst   . Gastroparesis   . Depression   . Heart failure, diastolic, chronic   . Persistent  headaches   . Rapid heart rate     some times  . GERD (gastroesophageal reflux disease)   . Forgetfulness   . Irritable   . Sleep difficulties   . Bone pain   . Osteoporosis   . Fatigue   . Disturbed concentration   . Itch     skin  . Nausea     little vomitting  . TIA (transient ischemic attack)   . Macular degeneration   . Keratosis   . Anxiety   . Panic attacks   . Bruises easily   . Chills   . Generalized headaches   . Stroke   . Hx of cardiovascular stress test     Lexiscan Myoview (06/2013):  No ischemia, EF 72%; Low Risk  . CHF (congestive heart failure)    PSH  Past Surgical History  Procedure Laterality Date  . Kidney transplant  1984  . Appendectomy    . Colon resection  2010    Colostomy  . Colostomy  2011    Reversal   . Colostomy reversal    . Left arm surgery      due to injury  . Cataract surgery bilateral    . Esophageal dilation    . Parathyroidectomy  11/04/2010    left inferior parathyroid   FH  Family History  Problem Relation Age of Onset  . Lung cancer Sister   . Cancer    . Heart disease Mother   . Stroke Father   . Aneurysm Brother   . Colon cancer Maternal Aunt 70  . Esophageal cancer Neg  Hx   . Rectal cancer Neg Hx   . Stomach cancer Neg Hx    SH  reports that she quit smoking about 25 years ago. Her smoking use included Cigarettes. She has a 5 pack-year smoking history. She has never used smokeless tobacco. She reports that she does not drink alcohol or use illicit drugs. Allergies  Allergies  Allergen Reactions  . Amoxicillin Diarrhea   Home medications Prior to Admission medications   Medication Sig Start Date End Date Taking? Authorizing Provider  Albuterol Sulfate (PROAIR RESPICLICK) 161 (90 BASE) MCG/ACT AEPB Inhale 2 puffs into the lungs every 4 (four) hours as needed. Patient taking differently: Inhale 2 puffs into the lungs every 4 (four) hours as needed (for wheezing).  10/23/13  Yes [name], MD  ammonium  lactate (LAC-HYDRIN) 12 % lotion Apply 1 application topically daily as needed for dry skin.  01/04/12  Yes Historical Provider, MD  aspirin 325 MG tablet Take 325 mg by mouth daily.   Yes Historical Provider, MD  atorvastatin (LIPITOR) 20 MG tablet Take 20 mg by mouth daily.    Yes Historical Provider, MD  azaTHIOprine (IMURAN) 50 MG tablet Take 50 mg by mouth every morning.    Yes Historical Provider, MD  cycloSPORINE (RESTASIS) 0.05 % ophthalmic emulsion Place 1 drop into both eyes 2 (two) times daily.     Yes Historical Provider, MD  ESTRACE VAGINAL 0.1 MG/GM vaginal cream Place 1 Applicatorful vaginally once a week.  04/10/14  Yes Historical Provider, MD  FLUoxetine (PROZAC) 20 MG capsule Take 20 mg by mouth every morning.    Yes Historical Provider, MD  furosemide (LASIX) 20 MG tablet Take 20-40 mg by mouth daily.    Yes Historical Provider, MD  HYDROcodone-acetaminophen (NORCO) 10-325 MG per tablet Take 1 tablet by mouth every 6 (six) hours as needed. For pain   Yes Historical Provider, MD  metoprolol (LOPRESSOR) 50 MG tablet TAKE ONE TABLET TWICE DAILY 04/02/14  Yes Jettie Booze, MD  omeprazole (PRILOSEC) 20 MG capsule Take 20 mg by mouth 2 (two) times daily.    Yes Historical Provider, MD  predniSONE (DELTASONE) 5 MG tablet Take 7.5 mg by mouth daily with breakfast.  04/30/14  Yes Historical Provider, MD  Probiotic Product (PROBIOTIC PO) Take 1 capsule by mouth daily.   Yes Historical Provider, MD  QUEtiapine (SEROQUEL) 100 MG tablet Take 200 mg by mouth at bedtime.    Yes Historical Provider, MD    Current Medications Scheduled Meds: . ampicillin (OMNIPEN) IV  2 g Intravenous Q6H  . antiseptic oral rinse  7 mL Mouth Rinse q12n4p  . chlorhexidine  15 mL Mouth Rinse BID  . docusate sodium  100 mg Oral BID  . enoxaparin (LOVENOX) injection  30 mg Subcutaneous Q24H   Continuous Infusions: . sodium chloride 75 mL/hr at 07/16/14 0717   PRN Meds:.acetaminophen, ondansetron,  oxyCODONE, zolpidem  CBC  Recent Labs Lab 07/15/14 1724  WBC 8.7  NEUTROABS 7.0  HGB 11.1*  HCT 34.0*  MCV 88.3  PLT 096   Basic Metabolic Panel  Recent Labs Lab 07/15/14 1724  NA 134*  K 3.7  CL 100*  CO2 25  GLUCOSE 115*  BUN 12  CREATININE 0.85  CALCIUM 8.9    Physical Exam  Blood pressure 151/58, pulse 102, temperature 100 F (37.8 C), temperature source Oral, resp. rate 20, height 5\' 5"  (1.651 m), weight 56.564 kg (124 lb 11.2 oz), SpO2 93 %. GEN: Appears  uncomfortable ENT: NCAT EYES: EOMI CV: RRR, nl s1s2 PULM: CTAB ABD: mild TTP in b/l lower quadarants, no R/G SKIN: no rashes/lesions EXT:no LEE   Assessment/Plan 25F with hx/o LRD KT since 1984 on Aza/Pred and normal GFR admitted with UTI, known recurrent UTI with enterococcus.    1. LRD KT 1. Cont Azathioprine and Prednisone at Outpt Doses Daily weights, Daily Renal Panel, Strict I/Os, Avoid nephrotoxins (NSAIDs, judicious IV Contrast] 1. UTI with HN 1. Recent/Recurrent enterococcal UTI with levoflox, PCN, and Vanc Sn 2. Discussed with Dr. Junious Silk, change to: 1. Vancomycin 2. Ceftriaxone 3. Has mild/mod HN, need to discern if acute/chronic, CT pending   Pearson Grippe MD [phone] [phone] 07/16/2014, 12:12 PM

## 2014-07-16 NOTE — Progress Notes (Signed)
   I discussed the patient with Dr. Joelyn Oms.  I agree with Rocephin and vancomycin and appreciate his input. Urine culture from office pending.  I reviewed her CT scan and there is no obvious obstruction. Fat planes between the bladder and the bowel appeared maintained and I do not appreciate any air in the bladder. The ureter visualized going into the bladder. There are no stones. As her bladder is full the hydronephrosis may be a bit fuller.  I suspect she has vesicoureteral reflux which would be expected with most transplant anastomoses.  I am going to place a Foley catheter to drain the bladder and attempt to decompress the kidney for a day or 2.

## 2014-07-17 ENCOUNTER — Inpatient Hospital Stay (HOSPITAL_COMMUNITY): Payer: Medicare Other

## 2014-07-17 LAB — RENAL FUNCTION PANEL
Albumin: 2.9 g/dL — ABNORMAL LOW (ref 3.5–5.0)
Anion gap: 8 (ref 5–15)
BUN: 13 mg/dL (ref 6–20)
CO2: 23 mmol/L (ref 22–32)
Calcium: 8.3 mg/dL — ABNORMAL LOW (ref 8.9–10.3)
Chloride: 102 mmol/L (ref 101–111)
Creatinine, Ser: 1.01 mg/dL — ABNORMAL HIGH (ref 0.44–1.00)
GFR calc Af Amer: >60 mL/min (ref >60)
GFR calc non Af Amer: 55 mL/min — ABNORMAL LOW (ref >60)
Glucose, Bld: 127 mg/dL — ABNORMAL HIGH (ref 65–99)
Phosphorus: 3.3 mg/dL (ref 2.5–4.6)
Potassium: 3.6 mmol/L (ref 3.5–5.1)
Sodium: 133 mmol/L — ABNORMAL LOW (ref 135–145)

## 2014-07-17 MED ORDER — PANTOPRAZOLE SODIUM 40 MG PO TBEC
40.0000 mg | DELAYED_RELEASE_TABLET | Freq: Every day | ORAL | Status: DC
  Administered 2014-07-17 – 2014-07-19 (×3): 40 mg via ORAL
  Filled 2014-07-17 (×3): qty 1

## 2014-07-17 NOTE — Progress Notes (Signed)
Renal u/s reviewed. I compared to renal office from office and do think the collecting system is less distended with bladder drained consistent with reflux. Foley can come out tomorrow if remains stable.   As far as distal ureteral anastomotic stricture/ischemia it is possible. After discharge, I'll set her up for a Mag-3 lasix renal scan with a foley in place to check kidney for obstruction. Mag-3 will likely look abnormal in face of pyelo therefore I don't think it will add a lot of info now.

## 2014-07-17 NOTE — Progress Notes (Signed)
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   Subjective:  Foley placed yesterday On Vanc and Ceftriaxone No longer febrile SCr 1.01, lytes ok, good UOP On Aza/Pred CT and Renal US reviewed  05/26 0701 - 05/27 0700 In: 2101.3 [I.V.:1801.3; IV Piggyback:300] Out: 1227 [Urine:1225; Stool:2]  Filed Weights   07/15/14 1639  Weight: 56.564 kg (124 lb 11.2 oz)    Scheduled Meds: . antiseptic oral rinse  7 mL Mouth Rinse q12n4p  . azaTHIOprine  50 mg Oral Daily  . cefTRIAXone (ROCEPHIN)  IV  1 g Intravenous Q24H  . chlorhexidine  15 mL Mouth Rinse BID  . docusate sodium  100 mg Oral BID  . enoxaparin (LOVENOX) injection  30 mg Subcutaneous Q24H  . pantoprazole  40 mg Oral Daily  . predniSONE  7.5 mg Oral Q breakfast  . vancomycin  1,000 mg Intravenous Q24H   Continuous Infusions: . sodium chloride 75 mL/hr at 07/16/14 0717   PRN Meds:.acetaminophen, ondansetron, oxyCODONE, zolpidem  Current Labs: reviewed    Physical Exam:  Blood pressure 92/51, pulse 58, temperature 97.4 F (36.3 C), temperature source Oral, resp. rate 20, height 5\' 5"  (1.651 m), weight 56.564 kg (124 lb 11.2 oz), SpO2 96 %. GEN: Appears uncomfortable ENT: NCAT EYES: EOMI CV: RRR, nl s1s2 PULM: CTAB ABD: mild TTP in b/l lower quadarants, no R/G SKIN: no rashes/lesions EXT:no LEE  A/P 1. LRD KT 1. Cont Azathioprine and Prednisone at Outpt Doses 2. Daily weights, Daily Renal Panel, Strict I/Os, Avoid nephrotoxins (NSAIDs, judicious IV Contrast] 1. UTI with HN of transplanted kidney 1. Recent/Recurrent enterococcal UTI with levoflox, PCN, and Vanc Sn 2. No Cx data yet (obtained at [facility] office), cont Vanc and Ceftriaxone, hopefully can use oral ABX 3. I wonder if the transplant HN, which appears chronic, could be related to distal ureteral stenosis from ischemia, will ask urology if that would be  consistent with findings.  Either way appears chronic, and transplanted kidneys can have mild/mod HN of little consequence 2. Pancreatic Cyst: no acute issue   Pearson Grippe MD 07/17/2014, 11:38 AM   Recent Labs Lab 07/15/14 1724 07/17/14 0517  NA 134* 133*  K 3.7 3.6  CL 100* 102  CO2 25 23  GLUCOSE 115* 127*  BUN 12 13  CREATININE 0.85 1.01*  CALCIUM 8.9 8.3*  PHOS  --  3.3    Recent Labs Lab 07/15/14 1724  WBC 8.7  NEUTROABS 7.0  HGB 11.1*  HCT 34.0*  MCV 88.3  PLT 223

## 2014-07-17 NOTE — Progress Notes (Signed)
Subjective:  Patient reports feeling better. Ate some breakfast. She felt much less "pressure" once foley was placed and is glad to have it in for now.   Objective: Vital signs in last 24 hours: Temp:  [97.4 F (36.3 C)-101.4 F (38.6 C)] 97.4 F (36.3 C) (05/27 0510) Pulse Rate:  [58-95] 58 (05/27 0510) Resp:  [20] 20 (05/27 0510) BP: (89-122)/(44-53) 92/51 mmHg (05/27 0510) SpO2:  [92 %-96 %] 96 % (05/27 0510)  Intake/Output from previous day: 05/26 0701 - 05/27 0700 In: 2101.3 [I.V.:1801.3; IV Piggyback:300] Out: 1227 [Urine:1225; Stool:2]; 500 ml over 12 hrs; 300 ml in bag now  Intake/Output this shift:    Physical Exam:  NAD Talking and laughing Abd - soft, NT, transplant kidney non-tender  Lab Results:  Recent Labs  07/15/14 1724  HGB 11.1*  HCT 34.0*   BMET  Recent Labs  07/15/14 1724 07/17/14 0517  NA 134* 133*  K 3.7 3.6  CL 100* 102  CO2 25 23  GLUCOSE 115* 127*  BUN 12 13  CREATININE 0.85 1.01*  CALCIUM 8.9 8.3*   No results for input(s): LABPT, INR in the last 72 hours. No results for input(s): LABURIN in the last 72 hours. Results for orders placed or performed in visit on 10/27/10  Surgical pcr screen     Status: None   Collection Time: 10/27/10 11:20 AM  Result Value Ref Range Status   MRSA, PCR NEGATIVE NEGATIVE Final   Staphylococcus aureus NEGATIVE NEGATIVE Final    Comment:        The Xpert SA Assay (FDA approved for NASAL specimens only), is one component of a comprehensive surveillance program.  It is not intended to diagnose infection nor to guide or monitor treatment.    Studies/Results: Ct Abdomen Pelvis Wo Contrast  07/16/2014   CLINICAL DATA:  Left lower quadrant and left flank pain with nausea, fever, chills, urgency, frequency and dysuria. Pyelonephritis. History of renal transplant.  EXAM: CT ABDOMEN AND PELVIS WITHOUT CONTRAST  TECHNIQUE: Multidetector CT imaging of the abdomen and pelvis was performed following  the standard protocol without IV contrast.  COMPARISON:  05/05/2013 and 01/12/2012 CT. Office renal ultrasound 07/15/2014 images  FINDINGS: Lower chest: There is stable fibrosis and paraseptal emphysema at both lung bases. No superimposed airspace disease or suspicious pulmonary nodule. No significant pleural or pericardial effusion. Coronary artery calcifications and a small hiatal hernia are again noted.  Hepatobiliary: Diffuse hepatic steatosis. No focal hepatic abnormalities identified on noncontrast imaging. No evidence of gallstones, gallbladder wall thickening or biliary dilatation.  Pancreas: A large complex mass is again noted involving the pancreatic tail. This demonstrates continued slow growth, measuring 8.5 x 5.7 cm transverse on image 23 (previously 8.0 x 4.8 cm) and up to 6.5 cm cephalocaudad. This mass is incompletely characterized without contrast. There are no associated calcifications. The pancreatic head appears unremarkable.  Spleen: Normal in size without focal abnormality.  Adrenals/Urinary Tract: Both adrenal glands appear normal.Stable marked atrophy of the native kidneys. Exophytic left renal lesions are stable, 1 partially calcified in the upper pole. The transplanted kidney in the left iliac fossa appears mildly enlarged compared with the prior CT, measuring 12.5 cm in length. Hydronephrosis and hydroureter have mildly increased as demonstrated on recent ultrasound. No evidence of ureteral obstruction. There is a 7 mm hyperdense lesion in the upper pole of the transplanted kidney on image 58. There is perinephric soft tissue stranding without focal fluid collection. Mass effect on the urinary bladder  is stable. No bladder abnormality identified. Pelvic floor laxity likely.  Stomach/Bowel: No evidence of bowel wall thickening, distention or surrounding inflammatory change.Postsurgical changes status post partial colon resection and sigmoid anastomosis.  Vascular/Lymphatic: Interval mild  enlargement of an external iliac node on the left, measuring 7 mm on image 53, likely reactive. No other enlarged retroperitoneal lymph nodes identified. There is extensive atherosclerosis of the aorta, its branches and the iliac arteries. There is dense calcification of the superior mesenteric artery. The aorta demonstrates fusiform dilatation with eccentric dilatation to 3.7 cm transverse (coronal image 39).  Reproductive: Uterine atrophy. No adnexal mass. Pelvic floor laxity likely.  Other: Small periumbilical hernia containing a knuckle of small bowel. No evidence of incarceration.  Musculoskeletal: No acute or significant osseous findings. There are mild degenerative changes within the spine associated with a scoliosis. There are advanced asymmetric left hip osteoarthritic changes with subchondral cyst formation.  IMPRESSION: 1. Progressive collecting system dilatation of the transplanted kidney in the left iliac fossa compared with prior CT concerning for possible obstructive uropathy. No calculus identified. If the patient is not able to receive intravenous contrast, cystoscopy and retrograde evaluation of the ureter may be helpful. 2. Mild nonspecific perinephric soft tissue stranding could be secondary to ureteral obstruction or inflammation. No focal perinephric fluid collection identified. 3. Stable marked atrophy of the native kidneys. 4. Continued slow growth of large complex mass involving the pancreatic tail. 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 5. Stable extensive atherosclerosis and infrarenal abdominal aortic aneurysm.   Electronically Signed   By: Richardean Sale M.D.   On: 07/16/2014 12:31  I reviewed the images.   Assessment/Plan: Transplant kidney pyelonephritis - fever improving. On rocephin and Vanc. Clinically much improved.   Hydronephrosis - UOP good. I plan to get renal u/s to check kidney with bladder decompressed. Looking back she had fullness  of collecting system in 2008 on CT as one would expect with reflux.    LOS: 2 days   Ailea Rhatigan 07/17/2014

## 2014-07-18 LAB — RENAL FUNCTION PANEL
ALBUMIN: 2.6 g/dL — AB (ref 3.5–5.0)
ANION GAP: 8 (ref 5–15)
BUN: 15 mg/dL (ref 6–20)
CHLORIDE: 106 mmol/L (ref 101–111)
CO2: 22 mmol/L (ref 22–32)
CREATININE: 0.96 mg/dL (ref 0.44–1.00)
Calcium: 8.6 mg/dL — ABNORMAL LOW (ref 8.9–10.3)
GFR calc Af Amer: >60 mL/min (ref >60)
GFR calc non Af Amer: 59 mL/min — ABNORMAL LOW (ref >60)
GLUCOSE: 96 mg/dL (ref 65–99)
POTASSIUM: 3.2 mmol/L — AB (ref 3.5–5.1)
Phosphorus: 2.1 mg/dL — ABNORMAL LOW (ref 2.5–4.6)
SODIUM: 136 mmol/L (ref 135–145)

## 2014-07-18 MED ORDER — FLUOXETINE HCL 20 MG PO CAPS
20.0000 mg | ORAL_CAPSULE | Freq: Every day | ORAL | Status: DC
  Administered 2014-07-18 – 2014-07-19 (×2): 20 mg via ORAL
  Filled 2014-07-18 (×2): qty 1

## 2014-07-18 MED ORDER — CIPROFLOXACIN HCL 500 MG PO TABS
500.0000 mg | ORAL_TABLET | Freq: Two times a day (BID) | ORAL | Status: DC
  Administered 2014-07-18 – 2014-07-19 (×3): 500 mg via ORAL
  Filled 2014-07-18 (×3): qty 1

## 2014-07-18 NOTE — Progress Notes (Signed)
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   Subjective:  U Cx with Klebsiella Changed to PO Cipro Outpt urology plan noted AF, stable VS Stable GFR, electrolytes  05/27 0701 - 05/28 0700 In: 2573.8 [P.O.:600; I.V.:1723.8; IV Piggyback:250] Out: 1900 [Urine:1900]  Filed Weights   07/15/14 1639 07/18/14 0500  Weight: 56.564 kg (124 lb 11.2 oz) 57.607 kg (127 lb)    Scheduled Meds: . antiseptic oral rinse  7 mL Mouth Rinse q12n4p  . azaTHIOprine  50 mg Oral Daily  . chlorhexidine  15 mL Mouth Rinse BID  . ciprofloxacin  500 mg Oral BID  . docusate sodium  100 mg Oral BID  . enoxaparin (LOVENOX) injection  30 mg Subcutaneous Q24H  . pantoprazole  40 mg Oral Daily  . predniSONE  7.5 mg Oral Q breakfast   Continuous Infusions: . sodium chloride 75 mL/hr at 07/18/14 0012   PRN Meds:.acetaminophen, ondansetron, oxyCODONE, zolpidem  Current Labs: reviewed    Physical Exam:  Blood pressure 122/65, pulse 76, temperature 98.2 F (36.8 C), temperature source Oral, resp. rate 18, height 5\' 5"  (1.651 m), weight 57.607 kg (127 lb), SpO2 98 %. GEN: Appears uncomfortable ENT: NCAT EYES: EOMI CV: RRR, nl s1s2 PULM: CTAB ABD: mild TTP in b/l lower quadarants, no R/G SKIN: no rashes/lesions EXT:no LEE  A/P 1. LRD KT 1. Cont Azathioprine and Prednisone at Outpt Doses 2. Daily weights, Daily Renal Panel, Strict I/Os, Avoid nephrotoxins (NSAIDs, judicious IV Contrast] 1. UTI with HN of transplanted kidney 1. U Cx at [facility] was Klebsiella 2. Now on Cipro 3. Urology to further address HN issues as an outpt 2. Pancreatic Cyst: no acute issue   Will sign off for now.  Please call with any questions or concerns.   Pearson Grippe MD 07/18/2014, 11:06 AM   Recent Labs Lab 07/15/14 1724 07/17/14 0517 07/18/14 0505  NA 134* 133* 136  K 3.7 3.6 3.2*  CL 100* 102 106  CO2 25 23  22   GLUCOSE 115* 127* 96  BUN 12 13 15   CREATININE 0.85 1.01* 0.96  CALCIUM 8.9 8.3* 8.6*  PHOS  --  3.3 2.1*    Recent Labs Lab 07/15/14 1724  WBC 8.7  NEUTROABS 7.0  HGB 11.1*  HCT 34.0*  MCV 88.3  PLT 223

## 2014-07-18 NOTE — Progress Notes (Signed)
S/W Dr. Louis Meckel via telephone. Verbal order taken for Fluoxetine 20mg  PO daily.  Lind Guest, RN

## 2014-07-18 NOTE — Progress Notes (Addendum)
S/W [facility] answering service and l/m that pt is requesting order for the Prozac that she usually takes at home. States she has been feeling irritable and hasn't taken it since 5/23 or 5/24.  Lind Guest, RN  Offered pt lavender eo as aromatherapy for relaxation. She agrees to try it. 1 drop lavender eo placed on gauze in med cup and pt instructed in inhalation aromatherapy. Lind Guest, RN

## 2014-07-18 NOTE — Progress Notes (Signed)
Subjective:  Patient reports feeling better - much better than yesterday and day before.  Still slight nausea.  Afebrile.  No pain.  Objective: Vital signs in last 24 hours: Temp:  [98.1 F (36.7 C)-98.4 F (36.9 C)] 98.2 F (36.8 C) (05/28 0512) Pulse Rate:  [71-76] 76 (05/28 0512) Resp:  [18-19] 18 (05/28 0512) BP: (119-137)/(63-71) 122/65 mmHg (05/28 0512) SpO2:  [95 %-98 %] 98 % (05/28 0512) Weight:  [57.607 kg (127 lb)] 57.607 kg (127 lb) (05/28 0500)  Intake/Output from previous day: 05/27 0701 - 05/28 0700 In: 2573.8 [P.O.:600; I.V.:1723.8; IV Piggyback:250] Out: 1900 [Urine:1900]; 500 ml over 12 hrs; 300 ml in bag now  Intake/Output this shift:    Physical Exam:  NAD Abd - soft, NT, transplant kidney non-tender  Lab Results:  Recent Labs  07/15/14 1724  HGB 11.1*  HCT 34.0*   BMET  Recent Labs  07/17/14 0517 07/18/14 0505  NA 133* 136  K 3.6 3.2*  CL 102 106  CO2 23 22  GLUCOSE 127* 96  BUN 13 15  CREATININE 1.01* 0.96  CALCIUM 8.3* 8.6*   No results for input(s): LABPT, INR in the last 72 hours. No results for input(s): LABURIN in the last 72 hours. Results for orders placed or performed in visit on 10/27/10  Surgical pcr screen     Status: None   Collection Time: 10/27/10 11:20 AM  Result Value Ref Range Status   MRSA, PCR NEGATIVE NEGATIVE Final   Staphylococcus aureus NEGATIVE NEGATIVE Final    Comment:        The Xpert SA Assay (FDA approved for NASAL specimens only), is one component of a comprehensive surveillance program.  It is not intended to diagnose infection nor to guide or monitor treatment.    Studies/Results: Ct Abdomen Pelvis Wo Contrast  07/16/2014   CLINICAL DATA:  Left lower quadrant and left flank pain with nausea, fever, chills, urgency, frequency and dysuria. Pyelonephritis. History of renal transplant.  EXAM: CT ABDOMEN AND PELVIS WITHOUT CONTRAST  TECHNIQUE: Multidetector CT imaging of the abdomen and pelvis  was performed following the standard protocol without IV contrast.  COMPARISON:  05/05/2013 and 01/12/2012 CT. Office renal ultrasound 07/15/2014 images  FINDINGS: Lower chest: There is stable fibrosis and paraseptal emphysema at both lung bases. No superimposed airspace disease or suspicious pulmonary nodule. No significant pleural or pericardial effusion. Coronary artery calcifications and a small hiatal hernia are again noted.  Hepatobiliary: Diffuse hepatic steatosis. No focal hepatic abnormalities identified on noncontrast imaging. No evidence of gallstones, gallbladder wall thickening or biliary dilatation.  Pancreas: A large complex mass is again noted involving the pancreatic tail. This demonstrates continued slow growth, measuring 8.5 x 5.7 cm transverse on image 23 (previously 8.0 x 4.8 cm) and up to 6.5 cm cephalocaudad. This mass is incompletely characterized without contrast. There are no associated calcifications. The pancreatic head appears unremarkable.  Spleen: Normal in size without focal abnormality.  Adrenals/Urinary Tract: Both adrenal glands appear normal.Stable marked atrophy of the native kidneys. Exophytic left renal lesions are stable, 1 partially calcified in the upper pole. The transplanted kidney in the left iliac fossa appears mildly enlarged compared with the prior CT, measuring 12.5 cm in length. Hydronephrosis and hydroureter have mildly increased as demonstrated on recent ultrasound. No evidence of ureteral obstruction. There is a 7 mm hyperdense lesion in the upper pole of the transplanted kidney on image 58. There is perinephric soft tissue stranding without focal fluid collection. Mass effect  on the urinary bladder is stable. No bladder abnormality identified. Pelvic floor laxity likely.  Stomach/Bowel: No evidence of bowel wall thickening, distention or surrounding inflammatory change.Postsurgical changes status post partial colon resection and sigmoid anastomosis.   Vascular/Lymphatic: Interval mild enlargement of an external iliac node on the left, measuring 7 mm on image 53, likely reactive. No other enlarged retroperitoneal lymph nodes identified. There is extensive atherosclerosis of the aorta, its branches and the iliac arteries. There is dense calcification of the superior mesenteric artery. The aorta demonstrates fusiform dilatation with eccentric dilatation to 3.7 cm transverse (coronal image 39).  Reproductive: Uterine atrophy. No adnexal mass. Pelvic floor laxity likely.  Other: Small periumbilical hernia containing a knuckle of small bowel. No evidence of incarceration.  Musculoskeletal: No acute or significant osseous findings. There are mild degenerative changes within the spine associated with a scoliosis. There are advanced asymmetric left hip osteoarthritic changes with subchondral cyst formation.  IMPRESSION: 1. Progressive collecting system dilatation of the transplanted kidney in the left iliac fossa compared with prior CT concerning for possible obstructive uropathy. No calculus identified. If the patient is not able to receive intravenous contrast, cystoscopy and retrograde evaluation of the ureter may be helpful. 2. Mild nonspecific perinephric soft tissue stranding could be secondary to ureteral obstruction or inflammation. No focal perinephric fluid collection identified. 3. Stable marked atrophy of the native kidneys. 4. Continued slow growth of large complex mass involving the pancreatic tail. 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 5. Stable extensive atherosclerosis and infrarenal abdominal aortic aneurysm.   Electronically Signed   By: Richardean Sale M.D.   On: 07/16/2014 12:31   US Renal  07/17/2014   CLINICAL DATA:  Hydronephrosis with transplant kidney, Foley catheter.  EXAM: RENAL / URINARY TRACT ULTRASOUND COMPLETE  COMPARISON:  CT 07/16/2014  FINDINGS: Right Kidney:  Length: 5.0 cm. Atrophic in echogenic  compatible with known chronic kidney disease. No mass or hydronephrosis visualized.  Left Kidney:  Not visualized.  Patient's transplant kidney is visualized over the left lower quadrant measuring 12.6 cm in length. Findings unchanged from the recent CT demonstrating mild hydronephrosis of the transplant kidney no focal mass or nephrolithiasis. No perinephric fluid.  Bladder:  Bladder is decompressed with the presence of a Foley catheter.  IMPRESSION: Mild hydronephrosis of transplant kidney in the left lower quadrant unchanged from recent CT findings.   Electronically Signed   By: Marin Olp M.D.   On: 07/17/2014 10:10    Urine culture from [facility]:  Klebsiella PNA > 100k cfu/hpf: resistant to PCN and Zosyn.    Assessment/Plan: Transplant kidney pyelonephritis - fever improving. Transitioning her to Cipro 500mg  BID.  Hydronephrosis - ? Improved with foley - ? Reflux most likely etiology.  Dr. Junious Silk plans to do MAG 3 once infection resolves.  D/C foley today.    Home tomorrow if afebrile 24 hrs with Cipro.   LOS: 3 days   Ardis Hughs 07/18/2014

## 2014-07-18 NOTE — Progress Notes (Signed)
Pt states the lavender aromatherapy helped her relax. Lind Guest, RN

## 2014-07-19 DIAGNOSIS — IMO0001 Reserved for inherently not codable concepts without codable children: Secondary | ICD-10-CM | POA: Diagnosis present

## 2014-07-19 LAB — RENAL FUNCTION PANEL
ALBUMIN: 2.7 g/dL — AB (ref 3.5–5.0)
ANION GAP: 8 (ref 5–15)
BUN: 9 mg/dL (ref 6–20)
CALCIUM: 8.8 mg/dL — AB (ref 8.9–10.3)
CO2: 23 mmol/L (ref 22–32)
CREATININE: 0.74 mg/dL (ref 0.44–1.00)
Chloride: 105 mmol/L (ref 101–111)
Glucose, Bld: 91 mg/dL (ref 65–99)
Phosphorus: 2.8 mg/dL (ref 2.5–4.6)
Potassium: 3.2 mmol/L — ABNORMAL LOW (ref 3.5–5.1)
Sodium: 136 mmol/L (ref 135–145)

## 2014-07-19 MED ORDER — CIPROFLOXACIN HCL 500 MG PO TABS: 500.0000 mg | ORAL_TABLET | Freq: Two times a day (BID) | ORAL | Status: DC

## 2014-07-19 NOTE — Discharge Summary (Signed)
Physician Discharge Summary  Patient ID: LILIT CINELLI MRN: [id number] DOB/AGE: [id number] y.o.  Admit date: 07/15/2014 Discharge date: 07/19/2014  Admission Diagnoses:  Acute pyelonephritis  Discharge Diagnoses:  Principal Problem:   Acute pyelonephritis Active Problems:   History of renal transplant   Hydronephrosis of kidney transplant, left   Past Medical History  Diagnosis Date  . Renal failure   . Diverticulitis of large intestine with perforation Hx colectomy  . Hypertension   . Barrett's esophagus   . Hyperlipemia   . Esophageal stricture   . CVA (cerebral infarction)     Mini Strokes   . Pancreatic cyst   . Gastroparesis   . Depression   . Heart failure, diastolic, chronic   . Persistent headaches   . Rapid heart rate     some times  . GERD (gastroesophageal reflux disease)   . Forgetfulness   . Irritable   . Sleep difficulties   . Bone pain   . Osteoporosis   . Fatigue   . Disturbed concentration   . Itch     skin  . Nausea     little vomitting  . TIA (transient ischemic attack)   . Macular degeneration   . Keratosis   . Anxiety   . Panic attacks   . Bruises easily   . Chills   . Generalized headaches   . Stroke   . Hx of cardiovascular stress test     Lexiscan Myoview (06/2013):  No ischemia, EF 72%; Low Risk  . CHF (congestive heart failure)     Surgeries:  on * No surgery found *   Consultants (if any): Treatment Team:  Mauricia Area, MD  Discharged Condition: Improved  Hospital Course: Morgan Cooper is an 69 y.o. female who was admitted 07/15/2014 with a diagnosis of Acute pyelonephritis.  She has a history of a left pelvic renal transplant and has normal renal function. She was initially treated with a dose of ceftriaxone in the office followed by ampicillin and gentamycin after admission.  She was found to have dilation of her transplant kidney that is felt to be secondary to reflux and was managed with a foley.  The foley was  removed yesterday and she is voiding well.    Her culture grew Klebsiella that was resistant to PCN and Zosyn.  She was placed on Cipro and remains afebrile on an oral dose.    She reports reduced pain today and is agreeable to discharge.   She was given perioperative antibiotics:      Anti-infectives    Start     Dose/Rate Route Frequency Ordered Stop   07/19/14 0000  ciprofloxacin (CIPRO) 500 MG tablet     500 mg Oral 2 times daily 07/19/14 1030     07/18/14 1015  ciprofloxacin (CIPRO) tablet 500 mg     500 mg Oral 2 times daily 07/18/14 0941     07/17/14 0800  gentamicin (GARAMYCIN) 400 mg in dextrose 5 % 100 mL IVPB  Status:  Discontinued     7 mg/kg  56.6 kg 110 mL/hr over 60 Minutes Intravenous Every 36 hours 07/16/14 0925 07/16/14 1212   07/16/14 1800  gentamicin (GARAMYCIN) 400 mg in dextrose 5 % 100 mL IVPB  Status:  Discontinued     7 mg/kg  56.6 kg 110 mL/hr over 60 Minutes Intravenous Every 24 hours 07/15/14 1821 07/16/14 0925   07/16/14 1500  vancomycin (VANCOCIN) IVPB 1000 mg/200 mL premix  Status:  Discontinued     1,000 mg 200 mL/hr over 60 Minutes Intravenous Every 24 hours 07/16/14 1253 07/18/14 0941   07/16/14 1400  cefTRIAXone (ROCEPHIN) 1 g in dextrose 5 % 50 mL IVPB - Premix  Status:  Discontinued     1 g 100 mL/hr over 30 Minutes Intravenous Every 24 hours 07/16/14 1253 07/18/14 0941   07/16/14 1000  ampicillin (OMNIPEN) 2 g in sodium chloride 0.9 % 50 mL IVPB  Status:  Discontinued     2 g 150 mL/hr over 20 Minutes Intravenous Every 6 hours 07/16/14 0854 07/16/14 1253   07/15/14 1830  gentamicin (GARAMYCIN) 400 mg in dextrose 5 % 100 mL IVPB     7 mg/kg  56.6 kg (Adjusted) 110 mL/hr over 60 Minutes Intravenous  Once 07/15/14 1818 07/15/14 2039    .  She was given enoxaparin for DVT prophylaxis.  She benefited maximally from the hospital stay and there were no complications.    Recent vital signs:  Filed Vitals:   07/19/14 0431  BP: 154/81  Pulse: 86   Temp: 98.5 F (36.9 C)  Resp: 18    Recent laboratory studies:  Lab Results  Component Value Date   HGB 11.1* 07/15/2014   HGB 12.9 03/09/2014   HGB 11.6* 12/20/2011   Lab Results  Component Value Date   WBC 8.7 07/15/2014   PLT 223 07/15/2014   Lab Results  Component Value Date   INR 0.95 12/20/2011   Lab Results  Component Value Date   NA 136 07/19/2014   K 3.2* 07/19/2014   CL 105 07/19/2014   CO2 23 07/19/2014   BUN 9 07/19/2014   CREATININE 0.74 07/19/2014   GLUCOSE 91 07/19/2014    Discharge Medications:     Medication List    TAKE these medications        Albuterol Sulfate 108 (90 BASE) MCG/ACT Aepb  Commonly known as:  PROAIR RESPICLICK  Inhale 2 puffs into the lungs every 4 (four) hours as needed.     ammonium lactate 12 % lotion  Commonly known as:  LAC-HYDRIN  Apply 1 application topically daily as needed for dry skin.     aspirin 325 MG tablet  Take 325 mg by mouth daily.     atorvastatin 20 MG tablet  Commonly known as:  LIPITOR  Take 20 mg by mouth daily.     azaTHIOprine 50 MG tablet  Commonly known as:  IMURAN  Take 50 mg by mouth every morning.     ciprofloxacin 500 MG tablet  Commonly known as:  CIPRO  Take 1 tablet (500 mg total) by mouth 2 (two) times daily.     cycloSPORINE 0.05 % ophthalmic emulsion  Commonly known as:  RESTASIS  Place 1 drop into both eyes 2 (two) times daily.     ESTRACE VAGINAL 0.1 MG/GM vaginal cream  Generic drug:  estradiol  Place 1 Applicatorful vaginally once a week.     FLUoxetine 20 MG capsule  Commonly known as:  PROZAC  Take 20 mg by mouth every morning.     furosemide 20 MG tablet  Commonly known as:  LASIX  Take 20-40 mg by mouth daily.     HYDROcodone-acetaminophen 10-325 MG per tablet  Commonly known as:  NORCO  Take 1 tablet by mouth every 6 (six) hours as needed. For pain     metoprolol 50 MG tablet  Commonly known as:  LOPRESSOR  TAKE ONE TABLET TWICE DAILY  omeprazole  20 MG capsule  Commonly known as:  PRILOSEC  Take 20 mg by mouth 2 (two) times daily.     predniSONE 5 MG tablet  Commonly known as:  DELTASONE  Take 7.5 mg by mouth daily with breakfast.     PROBIOTIC PO  Take 1 capsule by mouth daily.     QUEtiapine 100 MG tablet  Commonly known as:  SEROQUEL  Take 200 mg by mouth at bedtime.        Diagnostic Studies: Ct Abdomen Pelvis Wo Contrast  07/16/2014   CLINICAL DATA:  Left lower quadrant and left flank pain with nausea, fever, chills, urgency, frequency and dysuria. Pyelonephritis. History of renal transplant.  EXAM: CT ABDOMEN AND PELVIS WITHOUT CONTRAST  TECHNIQUE: Multidetector CT imaging of the abdomen and pelvis was performed following the standard protocol without IV contrast.  COMPARISON:  05/05/2013 and 01/12/2012 CT. Office renal ultrasound 07/15/2014 images  FINDINGS: Lower chest: There is stable fibrosis and paraseptal emphysema at both lung bases. No superimposed airspace disease or suspicious pulmonary nodule. No significant pleural or pericardial effusion. Coronary artery calcifications and a small hiatal hernia are again noted.  Hepatobiliary: Diffuse hepatic steatosis. No focal hepatic abnormalities identified on noncontrast imaging. No evidence of gallstones, gallbladder wall thickening or biliary dilatation.  Pancreas: A large complex mass is again noted involving the pancreatic tail. This demonstrates continued slow growth, measuring 8.5 x 5.7 cm transverse on image 23 (previously 8.0 x 4.8 cm) and up to 6.5 cm cephalocaudad. This mass is incompletely characterized without contrast. There are no associated calcifications. The pancreatic head appears unremarkable.  Spleen: Normal in size without focal abnormality.  Adrenals/Urinary Tract: Both adrenal glands appear normal.Stable marked atrophy of the native kidneys. Exophytic left renal lesions are stable, 1 partially calcified in the upper pole. The transplanted kidney in the left  iliac fossa appears mildly enlarged compared with the prior CT, measuring 12.5 cm in length. Hydronephrosis and hydroureter have mildly increased as demonstrated on recent ultrasound. No evidence of ureteral obstruction. There is a 7 mm hyperdense lesion in the upper pole of the transplanted kidney on image 58. There is perinephric soft tissue stranding without focal fluid collection. Mass effect on the urinary bladder is stable. No bladder abnormality identified. Pelvic floor laxity likely.  Stomach/Bowel: No evidence of bowel wall thickening, distention or surrounding inflammatory change.Postsurgical changes status post partial colon resection and sigmoid anastomosis.  Vascular/Lymphatic: Interval mild enlargement of an external iliac node on the left, measuring 7 mm on image 53, likely reactive. No other enlarged retroperitoneal lymph nodes identified. There is extensive atherosclerosis of the aorta, its branches and the iliac arteries. There is dense calcification of the superior mesenteric artery. The aorta demonstrates fusiform dilatation with eccentric dilatation to 3.7 cm transverse (coronal image 39).  Reproductive: Uterine atrophy. No adnexal mass. Pelvic floor laxity likely.  Other: Small periumbilical hernia containing a knuckle of small bowel. No evidence of incarceration.  Musculoskeletal: No acute or significant osseous findings. There are mild degenerative changes within the spine associated with a scoliosis. There are advanced asymmetric left hip osteoarthritic changes with subchondral cyst formation.  IMPRESSION: 1. Progressive collecting system dilatation of the transplanted kidney in the left iliac fossa compared with prior CT concerning for possible obstructive uropathy. No calculus identified. If the patient is not able to receive intravenous contrast, cystoscopy and retrograde evaluation of the ureter may be helpful. 2. Mild nonspecific perinephric soft tissue stranding could be secondary to  ureteral  obstruction or inflammation. No focal perinephric fluid collection identified. 3. Stable marked atrophy of the native kidneys. 4. Continued slow growth of large complex mass involving the pancreatic tail. 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 5. Stable extensive atherosclerosis and infrarenal abdominal aortic aneurysm.   Electronically Signed   By: Richardean Sale M.D.   On: 07/16/2014 12:31   US Renal  07/17/2014   CLINICAL DATA:  Hydronephrosis with transplant kidney, Foley catheter.  EXAM: RENAL / URINARY TRACT ULTRASOUND COMPLETE  COMPARISON:  CT 07/16/2014  FINDINGS: Right Kidney:  Length: 5.0 cm. Atrophic in echogenic compatible with known chronic kidney disease. No mass or hydronephrosis visualized.  Left Kidney:  Not visualized.  Patient's transplant kidney is visualized over the left lower quadrant measuring 12.6 cm in length. Findings unchanged from the recent CT demonstrating mild hydronephrosis of the transplant kidney no focal mass or nephrolithiasis. No perinephric fluid.  Bladder:  Bladder is decompressed with the presence of a Foley catheter.  IMPRESSION: Mild hydronephrosis of transplant kidney in the left lower quadrant unchanged from recent CT findings.   Electronically Signed   By: Marin Olp M.D.   On: 07/17/2014 10:10    Disposition: 01-Home or Self Care    Follow-up Information    Call [facility], Rodman Key, MD.   Specialty:  Urology   Why:  Call for an appt for 1-2 weeks from now.    Contact information:   [place] [id number] [phone]        Signed: Malka So 07/19/2014, 10:32 AM

## 2014-07-19 NOTE — Progress Notes (Signed)
Pt. Left via wheelchair, no respiratory distress noted. Discharged to home with sister.

## 2014-07-19 NOTE — Progress Notes (Signed)
Discharge teaching completed with teach back. Pt. to pick up Ciprofloxacin medication at [facility]. Instructed to call for follow up appointment on Tuesday. Discharge instructions given and reviewed with pt. and family. Answered questions.

## 2014-07-19 NOTE — Discharge Instructions (Signed)
Pyelonephritis, Adult °Pyelonephritis is a kidney infection. In general, there are 2 main types of pyelonephritis: °· Infections that come on quickly without any warning (acute pyelonephritis). °· Infections that persist for a long period of time (chronic pyelonephritis). °CAUSES  °Two main causes of pyelonephritis are: °· Bacteria traveling from the bladder to the kidney. This is a problem especially in pregnant women. The urine in the bladder can become filled with bacteria from multiple causes, including: °¨ Inflammation of the prostate gland (prostatitis). °¨ Sexual intercourse in females. °¨ Bladder infection (cystitis). °· Bacteria traveling from the bloodstream to the tissue part of the kidney. °Problems that may increase your risk of getting a kidney infection include: °· Diabetes. °· Kidney stones or bladder stones. °· Cancer. °· Catheters placed in the bladder. °· Other abnormalities of the kidney or ureter. °SYMPTOMS  °· Abdominal pain. °· Pain in the side or flank area. °· Fever. °· Chills. °· Upset stomach. °· Blood in the urine (dark urine). °· Frequent urination. °· Strong or persistent urge to urinate. °· Burning or stinging when urinating. °DIAGNOSIS  °Your caregiver may diagnose your kidney infection based on your symptoms. A urine sample may also be taken. °TREATMENT  °In general, treatment depends on how severe the infection is.  °· If the infection is mild and caught early, your caregiver may treat you with oral antibiotics and send you home. °· If the infection is more severe, the bacteria may have gotten into the bloodstream. This will require intravenous (IV) antibiotics and a hospital stay. Symptoms may include: °¨ High fever. °¨ Severe flank pain. °¨ Shaking chills. °· Even after a hospital stay, your caregiver may require you to be on oral antibiotics for a period of time. °· Other treatments may be required depending upon the cause of the infection. °HOME CARE INSTRUCTIONS  °· Take your  antibiotics as directed. Finish them even if you start to feel better. °· Make an appointment to have your urine checked to make sure the infection is gone. °· Drink enough fluids to keep your urine clear or pale yellow. °· Take medicines for the bladder if you have urgency and frequency of urination as directed by your caregiver. °SEEK IMMEDIATE MEDICAL CARE IF:  °· You have a fever or persistent symptoms for more than 2-3 days. °· You have a fever and your symptoms suddenly get worse. °· You are unable to take your antibiotics or fluids. °· You develop shaking chills. °· You experience extreme weakness or fainting. °· There is no improvement after 2 days of treatment. °MAKE SURE YOU: °· Understand these instructions. °· Will watch your condition. °· Will get help right away if you are not doing well or get worse. °Document Released: 02/06/2005 Document Revised: 08/08/2011 Document Reviewed: 07/13/2010 °[employer]® Patient Information ©2015 [employer]. This information is not intended to replace advice given to you by your health care provider. Make sure you discuss any questions you have with your health care provider. ° °

## 2014-07-26 ENCOUNTER — Encounter: Payer: Self-pay | Admitting: Interventional Cardiology

## 2014-07-27 ENCOUNTER — Other Ambulatory Visit (HOSPITAL_COMMUNITY): Payer: Self-pay | Admitting: Urology

## 2014-07-27 DIAGNOSIS — N1330 Unspecified hydronephrosis: Secondary | ICD-10-CM

## 2014-07-27 DIAGNOSIS — T8619 Other complication of kidney transplant: Principal | ICD-10-CM

## 2014-07-29 ENCOUNTER — Ambulatory Visit (HOSPITAL_COMMUNITY)
Admission: RE | Admit: 2014-07-29 | Discharge: 2014-07-29 | Disposition: A | Discharge status: Home / Self Care | Payer: Medicare Other | Source: Ambulatory Visit | Attending: Urology | Admitting: Urology

## 2014-07-29 DIAGNOSIS — N1330 Unspecified hydronephrosis: Secondary | ICD-10-CM | POA: Insufficient documentation

## 2014-07-29 DIAGNOSIS — Z940 Kidney transplant status: Secondary | ICD-10-CM | POA: Insufficient documentation

## 2014-07-29 DIAGNOSIS — T8619 Other complication of kidney transplant: Secondary | ICD-10-CM

## 2014-07-29 MED ORDER — TECHNETIUM TC 99M MERTIATIDE
16.8000 | Freq: Once | INTRAVENOUS | Status: AC | PRN
  Administered 2014-07-29: 16.8 via INTRAVENOUS

## 2014-07-29 MED ORDER — FUROSEMIDE 10 MG/ML IJ SOLN
27.0000 mg | Freq: Once | INTRAMUSCULAR | Status: AC
  Administered 2014-07-29: 27 mg via INTRAVENOUS
  Filled 2014-07-29: qty 4

## 2014-07-31 ENCOUNTER — Telehealth: Payer: Self-pay | Admitting: Interventional Cardiology

## 2014-07-31 NOTE — Telephone Encounter (Signed)
    Would decrease metoprolol to 25 mg BID if Systolic less than 886.  No metoprolol if systolic less than 90.  If systolic > 484, take usual metoprolol dose.    JV

## 2014-07-31 NOTE — Telephone Encounter (Signed)
Spoke with pt's daughter Danton Clap and informed her of new instructions for Metoprolol.  Daughter verbalized understanding and was in agreement.

## 2014-08-11 DIAGNOSIS — F330 Major depressive disorder, recurrent, mild: Secondary | ICD-10-CM | POA: Diagnosis not present

## 2014-08-14 ENCOUNTER — Other Ambulatory Visit: Payer: Self-pay | Admitting: Interventional Cardiology

## 2014-08-14 ENCOUNTER — Other Ambulatory Visit: Payer: Self-pay

## 2014-08-14 DIAGNOSIS — G894 Chronic pain syndrome: Secondary | ICD-10-CM | POA: Diagnosis not present

## 2014-08-14 DIAGNOSIS — Z79891 Long term (current) use of opiate analgesic: Secondary | ICD-10-CM | POA: Diagnosis not present

## 2014-08-14 DIAGNOSIS — M5442 Lumbago with sciatica, left side: Secondary | ICD-10-CM | POA: Diagnosis not present

## 2014-08-14 MED ORDER — FUROSEMIDE 20 MG PO TABS: 20.0000 mg | ORAL_TABLET | Freq: Every day | ORAL | Status: DC

## 2014-09-09 ENCOUNTER — Other Ambulatory Visit: Payer: Self-pay

## 2014-09-09 ENCOUNTER — Other Ambulatory Visit: Payer: Self-pay | Admitting: Interventional Cardiology

## 2014-09-09 MED ORDER — FUROSEMIDE 20 MG PO TABS: 20.0000 mg | ORAL_TABLET | Freq: Every day | ORAL | Status: DC

## 2014-09-30 DIAGNOSIS — N185 Chronic kidney disease, stage 5: Secondary | ICD-10-CM | POA: Diagnosis not present

## 2014-09-30 DIAGNOSIS — N2581 Secondary hyperparathyroidism of renal origin: Secondary | ICD-10-CM | POA: Diagnosis not present

## 2014-09-30 DIAGNOSIS — D631 Anemia in chronic kidney disease: Secondary | ICD-10-CM | POA: Diagnosis not present

## 2014-09-30 DIAGNOSIS — E785 Hyperlipidemia, unspecified: Secondary | ICD-10-CM | POA: Diagnosis not present

## 2014-09-30 DIAGNOSIS — I10 Essential (primary) hypertension: Secondary | ICD-10-CM | POA: Diagnosis not present

## 2014-10-06 ENCOUNTER — Other Ambulatory Visit: Payer: Self-pay

## 2014-10-06 DIAGNOSIS — Z1231 Encounter for screening mammogram for malignant neoplasm of breast: Secondary | ICD-10-CM

## 2014-10-13 DIAGNOSIS — Z961 Presence of intraocular lens: Secondary | ICD-10-CM | POA: Diagnosis not present

## 2014-10-13 DIAGNOSIS — H3509 Other intraretinal microvascular abnormalities: Secondary | ICD-10-CM | POA: Diagnosis not present

## 2014-10-13 DIAGNOSIS — H04129 Dry eye syndrome of unspecified lacrimal gland: Secondary | ICD-10-CM | POA: Diagnosis not present

## 2014-10-13 DIAGNOSIS — H3540 Unspecified peripheral retinal degeneration: Secondary | ICD-10-CM | POA: Diagnosis not present

## 2014-10-13 DIAGNOSIS — H35342 Macular cyst, hole, or pseudohole, left eye: Secondary | ICD-10-CM | POA: Diagnosis not present

## 2014-10-14 ENCOUNTER — Encounter: Payer: Self-pay | Admitting: Interventional Cardiology

## 2014-10-28 ENCOUNTER — Encounter (INDEPENDENT_AMBULATORY_CARE_PROVIDER_SITE_OTHER): Payer: Medicare Other | Admitting: Ophthalmology

## 2014-10-28 DIAGNOSIS — H43813 Vitreous degeneration, bilateral: Secondary | ICD-10-CM | POA: Diagnosis not present

## 2014-10-28 DIAGNOSIS — H3532 Exudative age-related macular degeneration: Secondary | ICD-10-CM | POA: Diagnosis not present

## 2014-10-28 DIAGNOSIS — I10 Essential (primary) hypertension: Secondary | ICD-10-CM

## 2014-10-28 DIAGNOSIS — H35033 Hypertensive retinopathy, bilateral: Secondary | ICD-10-CM

## 2014-10-28 DIAGNOSIS — H3531 Nonexudative age-related macular degeneration: Secondary | ICD-10-CM | POA: Diagnosis not present

## 2014-10-30 DIAGNOSIS — N3020 Other chronic cystitis without hematuria: Secondary | ICD-10-CM | POA: Diagnosis not present

## 2014-11-11 DIAGNOSIS — F330 Major depressive disorder, recurrent, mild: Secondary | ICD-10-CM | POA: Diagnosis not present

## 2014-11-16 ENCOUNTER — Ambulatory Visit
Admission: RE | Admit: 2014-11-16 | Discharge: 2014-11-16 | Disposition: A | Discharge status: Home / Self Care | Payer: Medicare Other | Source: Ambulatory Visit

## 2014-11-16 DIAGNOSIS — Z1231 Encounter for screening mammogram for malignant neoplasm of breast: Secondary | ICD-10-CM

## 2014-11-17 ENCOUNTER — Other Ambulatory Visit: Payer: Self-pay | Admitting: Interventional Cardiology

## 2014-11-25 ENCOUNTER — Encounter (INDEPENDENT_AMBULATORY_CARE_PROVIDER_SITE_OTHER): Payer: Medicare Other | Admitting: Ophthalmology

## 2014-11-25 DIAGNOSIS — H43813 Vitreous degeneration, bilateral: Secondary | ICD-10-CM

## 2014-11-25 DIAGNOSIS — I10 Essential (primary) hypertension: Secondary | ICD-10-CM

## 2014-11-25 DIAGNOSIS — H353221 Exudative age-related macular degeneration, left eye, with active choroidal neovascularization: Secondary | ICD-10-CM

## 2014-11-25 DIAGNOSIS — H353114 Nonexudative age-related macular degeneration, right eye, advanced atrophic with subfoveal involvement: Secondary | ICD-10-CM | POA: Diagnosis not present

## 2014-11-25 DIAGNOSIS — H35033 Hypertensive retinopathy, bilateral: Secondary | ICD-10-CM | POA: Diagnosis not present

## 2014-12-07 DIAGNOSIS — Z79891 Long term (current) use of opiate analgesic: Secondary | ICD-10-CM | POA: Diagnosis not present

## 2014-12-07 DIAGNOSIS — M5442 Lumbago with sciatica, left side: Secondary | ICD-10-CM | POA: Diagnosis not present

## 2014-12-07 DIAGNOSIS — G894 Chronic pain syndrome: Secondary | ICD-10-CM | POA: Diagnosis not present

## 2014-12-18 DIAGNOSIS — Z23 Encounter for immunization: Secondary | ICD-10-CM | POA: Diagnosis not present

## 2014-12-18 DIAGNOSIS — M542 Cervicalgia: Secondary | ICD-10-CM | POA: Diagnosis not present

## 2014-12-23 ENCOUNTER — Encounter (INDEPENDENT_AMBULATORY_CARE_PROVIDER_SITE_OTHER): Payer: Medicare Other | Admitting: Ophthalmology

## 2014-12-23 DIAGNOSIS — H35033 Hypertensive retinopathy, bilateral: Secondary | ICD-10-CM | POA: Diagnosis not present

## 2014-12-23 DIAGNOSIS — H353114 Nonexudative age-related macular degeneration, right eye, advanced atrophic with subfoveal involvement: Secondary | ICD-10-CM | POA: Diagnosis not present

## 2014-12-23 DIAGNOSIS — H353221 Exudative age-related macular degeneration, left eye, with active choroidal neovascularization: Secondary | ICD-10-CM | POA: Diagnosis not present

## 2014-12-23 DIAGNOSIS — I10 Essential (primary) hypertension: Secondary | ICD-10-CM

## 2014-12-23 DIAGNOSIS — H43813 Vitreous degeneration, bilateral: Secondary | ICD-10-CM

## 2015-01-05 ENCOUNTER — Encounter: Payer: Self-pay | Admitting: Gastroenterology

## 2015-01-05 ENCOUNTER — Telehealth: Payer: Self-pay | Admitting: Gastroenterology

## 2015-01-05 NOTE — Telephone Encounter (Signed)
Spoke with the daughter. Last CT of the abdomen was 6 months ago. Unclear when the recommended follow up is. She has an appointment with the pre-visit nurse, an EGD and a follow up appointment with Dr Silverio Decamp scheduled. She agrees to discuss this at the appointment.

## 2015-01-19 ENCOUNTER — Encounter: Payer: Self-pay | Admitting: Nurse Practitioner

## 2015-01-19 ENCOUNTER — Ambulatory Visit (INDEPENDENT_AMBULATORY_CARE_PROVIDER_SITE_OTHER): Payer: Medicare Other | Admitting: Nurse Practitioner

## 2015-01-19 VITALS — BP 140/80 | HR 51 | Ht 65.0 in | Wt 120.8 lb

## 2015-01-19 DIAGNOSIS — R0600 Dyspnea, unspecified: Secondary | ICD-10-CM

## 2015-01-19 DIAGNOSIS — R6889 Other general symptoms and signs: Secondary | ICD-10-CM

## 2015-01-19 DIAGNOSIS — R0789 Other chest pain: Secondary | ICD-10-CM

## 2015-01-19 DIAGNOSIS — I5032 Chronic diastolic (congestive) heart failure: Secondary | ICD-10-CM

## 2015-01-19 LAB — CBC
HCT: 35.8 % — ABNORMAL LOW (ref 36.0–46.0)
Hemoglobin: 11.8 g/dL — ABNORMAL LOW (ref 12.0–15.0)
MCH: 28.2 pg (ref 26.0–34.0)
MCHC: 33 g/dL (ref 30.0–36.0)
MCV: 85.4 fL (ref 78.0–100.0)
MPV: 9.7 fL (ref 8.6–12.4)
Platelets: 304 10*3/uL (ref 150–400)
RBC: 4.19 MIL/uL (ref 3.87–5.11)
RDW: 16.3 % — ABNORMAL HIGH (ref 11.5–15.5)
WBC: 6.8 10*3/uL (ref 4.0–10.5)

## 2015-01-19 LAB — BASIC METABOLIC PANEL
BUN: 13 mg/dL (ref 7–25)
CO2: 27 mmol/L (ref 20–31)
Calcium: 9.5 mg/dL (ref 8.6–10.4)
Chloride: 105 mmol/L (ref 98–110)
Creat: 0.85 mg/dL (ref 0.60–0.93)
Glucose, Bld: 83 mg/dL (ref 65–99)
Potassium: 4 mmol/L (ref 3.5–5.3)
Sodium: 138 mmol/L (ref 135–146)

## 2015-01-19 LAB — LIPID PANEL
Cholesterol: 157 mg/dL (ref 125–200)
HDL: 47 mg/dL (ref >=46)
LDL Cholesterol: 84 mg/dL (ref <130)
Total CHOL/HDL Ratio: 3.3 Ratio (ref <=5.0)
Triglycerides: 131 mg/dL (ref <150)
VLDL: 26 mg/dL (ref <30)

## 2015-01-19 LAB — HEPATIC FUNCTION PANEL
ALT: 9 U/L (ref 6–29)
AST: 14 U/L (ref 10–35)
Albumin: 4 g/dL (ref 3.6–5.1)
Alkaline Phosphatase: 54 U/L (ref 33–130)
Bilirubin, Direct: 0.1 mg/dL (ref <=0.2)
Indirect Bilirubin: 0.3 mg/dL (ref 0.2–1.2)
Total Bilirubin: 0.4 mg/dL (ref 0.2–1.2)
Total Protein: 7 g/dL (ref 6.1–8.1)

## 2015-01-19 LAB — BRAIN NATRIURETIC PEPTIDE: Brain Natriuretic Peptide: 207.3 pg/mL — ABNORMAL HIGH (ref 0.0–100.0)

## 2015-01-19 LAB — TSH: TSH: 3.018 u[IU]/mL (ref 0.350–4.500)

## 2015-01-19 NOTE — Patient Instructions (Addendum)
We will be checking the following labs today - BNP, BMET, CBC, HPF, Lipids and TSH   Medication Instructions:    Continue with your current medicines.         Start Baby Asprin ( 81 mg ) daily. Given samples today   Testing/Procedures To Be Arranged:  Echo  Lexiscan  Follow-Up:   See Dr. Irish Lack in 6 months - it will be sooner if your studies are abnormal.     Other Special Instructions:   N/A    If you need a refill on your cardiac medications before your next appointment, please call your pharmacy.   Call the [place] office at [phone] if you have any questions, problems or concerns.

## 2015-01-19 NOTE — Progress Notes (Signed)
CARDIOLOGY OFFICE NOTE  Date:  01/19/2015    Morgan Cooper Date of Birth: May 22, 1945 Medical Record [id number]  PCP:  Lottie Dawson, MD  Cardiologist:  [facility]    Chief Complaint  Patient presents with  . Congestive Heart Failure    1 year check - seen for Dr. Irish Lack    History of Present Illness: Morgan Cooper is a 69 y.o. female who presents today for a one year check. Seen for Dr. Irish Lack. She has a history of diastolic HF, HTN, HLD, prior remote kidney transplant, past TIA/strokes and past atrial tach noted on an event monitor in 2015. Other issues as noted below. Negative stress test in 2015. Remote echo with grade 2 diastolic dysfunction from 2013.   Last seen one year ago - seemed to be stable. Noted to hold medicines as BP and HR were labile. Had had some chest pain earlier in the year - trying to manage medically due to kidney transplant and not undergo cardiac cath.  Comes back today. Here alone. She rambles with her history. Has stopped several of her medicines but really can't see where she has done this.  Says "does not like to take lots of medicines" and "gets sick on her stomach". Tells me she has not felt well for "a long time". Lots of UTIs. Late here today due to some confusion. Says she has lots of shortness of breath and episodes of heart pounding. Lots of indigestion. Stopped her aspirin due to stomach aspirin. Was on regular dose. Tries to be active but no real exercise due to dyspnea. Her neck hurts. Little cough. Nightsweats. Throat is scratchy. Stays sleeping. Seeing PCP next month. She feels like she is worse than when she was here one year ago but then says it is her neck that is bothering her the most.   Past Medical History  Diagnosis Date  . Renal failure   . Diverticulitis of large intestine with perforation Hx colectomy  . Hypertension   . Barrett's esophagus   . Hyperlipemia   . Esophageal stricture   . CVA (cerebral  infarction)     Mini Strokes   . Pancreatic cyst   . Gastroparesis   . Depression   . Heart failure, diastolic, chronic ([place])   . Persistent headaches   . Rapid heart rate     some times  . GERD (gastroesophageal reflux disease)   . Forgetfulness   . Irritable   . Sleep difficulties   . Bone pain   . Osteoporosis   . Fatigue   . Disturbed concentration   . Itch     skin  . Nausea     little vomitting  . TIA (transient ischemic attack)   . Macular degeneration   . Keratosis   . Anxiety   . Panic attacks   . Bruises easily   . Chills   . Generalized headaches   . Stroke ([place])   . Hx of cardiovascular stress test     Lexiscan Myoview (06/2013):  No ischemia, EF 72%; Low Risk  . CHF (congestive heart failure) [facility])     Past Surgical History  Procedure Laterality Date  . Kidney transplant  1984  . Appendectomy    . Colon resection  2010    Colostomy  . Colostomy  2011    Reversal   . Colostomy reversal    . Left arm surgery      due to injury  .  Cataract surgery bilateral    . Esophageal dilation    . Parathyroidectomy  11/04/2010    left inferior parathyroid     Medications: Current Outpatient Prescriptions  Medication Sig Dispense Refill  . atorvastatin (LIPITOR) 20 MG tablet Take 20 mg by mouth daily.     Marland Kitchen azaTHIOprine (IMURAN) 50 MG tablet Take 50 mg by mouth every morning.     . cycloSPORINE (RESTASIS) 0.05 % ophthalmic emulsion Place 1 drop into both eyes 2 (two) times daily.      Marland Kitchen FLUoxetine (PROZAC) 20 MG capsule Take 20 mg by mouth every morning.     . furosemide (LASIX) 20 MG tablet TAKE ONE OR TWO TABLETS DAILY 30 tablet 2  . HYDROcodone-acetaminophen (NORCO) 10-325 MG per tablet Take 1 tablet by mouth every 6 (six) hours as needed. For pain    . metoprolol (LOPRESSOR) 50 MG tablet TAKE ONE TABLET TWICE DAILY 60 tablet 8  . omeprazole (PRILOSEC) 20 MG capsule Take 20 mg by mouth 2 (two) times daily.     . predniSONE (DELTASONE) 5 MG tablet Take  7.5 mg by mouth daily with breakfast.     . QUEtiapine (SEROQUEL) 100 MG tablet Take 200 mg by mouth at bedtime.      No current facility-administered medications for this visit.    Allergies: Allergies  Allergen Reactions  . Amoxicillin Diarrhea    Social History: The patient  reports that she quit smoking about 25 years ago. Her smoking use included Cigarettes. She has a 5 pack-year smoking history. She has never used smokeless tobacco. She reports that she does not drink alcohol or use illicit drugs.   Family History: The patient's family history includes Aneurysm in her brother; Cancer in an other family member; Colon cancer (age of onset: 61) in her maternal aunt; Heart disease in her mother; Lung cancer in her sister; Stroke in her father. There is no history of Esophageal cancer, Rectal cancer, or Stomach cancer.   Review of Systems: Please see the history of present illness.   Otherwise, the review of systems is positive for none.   All other systems are reviewed and negative.   Physical Exam: VS:  BP 140/80 mmHg  Pulse 51  Ht 5\' 5"  (1.651 m)  Wt 120 lb 12.8 oz (54.795 kg)  BMI 20.10 kg/m2  SpO2 93% .  BMI Body mass index is 20.1 kg/(m^2).  Wt Readings from Last 3 Encounters:  01/19/15 120 lb 12.8 oz (54.795 kg)  07/19/14 123 lb 10.9 oz (56.1 kg)  05/20/14 127 lb (57.607 kg)    General: Pleasant. Looks chronically ill but in no acute distress. Her weight is down 7 pounds.   HEENT: Normal. Neck: Supple, no JVD, carotid bruits, or masses noted.  Cardiac: Regular rate and rhythm. No murmurs, rubs, or gallops. No edema.  Respiratory:  Lungs are clear to auscultation bilaterally with normal work of breathing.  GI: Soft and nontender.  MS: No deformity or atrophy. Gait and ROM intact. Skin: Warm and dry. Color is normal.  Neuro:  Strength and sensation are intact and no gross focal deficits noted.  Psych: Alert, flat affect.   LABORATORY DATA:  EKG:  EKG is ordered  today. This demonstrates sinus brady with a rate of 51.  Lab Results  Component Value Date   WBC 8.7 07/15/2014   HGB 11.1* 07/15/2014   HCT 34.0* 07/15/2014   PLT 223 07/15/2014   GLUCOSE 91 07/19/2014   ALT 13* 07/15/2014  AST 20 07/15/2014   NA 136 07/19/2014   K 3.2* 07/19/2014   CL 105 07/19/2014   CREATININE 0.74 07/19/2014   BUN 9 07/19/2014   CO2 23 07/19/2014   TSH 1.90 06/12/2013   INR 0.95 12/20/2011    BNP (last 3 results)  Recent Labs  03/09/14 1410  BNP 287.7*    ProBNP (last 3 results) No results for input(s): PROBNP in the last 8760 hours.   Other Studies Reviewed Today: Myoview Impression from 06/2013 Exercise Capacity: Lexiscan with no exercise. BP Response: Normal blood pressure response. Clinical Symptoms: Typical symptoms with Lexiscan. ECG Impression: No significant ST segment change suggestive of ischemia. Comparison with Prior Nuclear Study: No images to compare  Overall Impression: Low risk stress nuclear study with no ischemia identified..  LV Ejection Fraction: 72%. LV Wall Motion: NL LV Function; NL Wall Motion   Signed by Ines Bloomer on 07/01/2013 at 10:06 AM. Candee Furbish, MD  Assessment/Plan: 1. Multitude of somatic complaints - she says she is worse than her usual baseline - seems chronically ill and flat to me today - not sure what to make of all of her symptoms. Has not seen her PCP in some time per her report. Will check baseline labs. Update her echo and Myoview. Seeing PCP next month.  2. Chest pain - will get Myoview updated. EKG today ok.   3. Dyspnea - hard to gauge - check BNP. Updating echo and Myoview.   4. Chronic diastolic HF - weight is actually down.    Current medicines are reviewed with the patient today.  The patient does not have concerns regarding medicines other than what has been noted above.  The following changes have been made:  See above.  Labs/ tests ordered today include:    Orders  Placed This Encounter  Procedures  . Basic metabolic panel  . CBC  . TSH  . Hepatic function panel  . Lipid panel  . Brain natriuretic peptide  . Myocardial Perfusion Imaging  . EKG 12-Lead  . ECHOCARDIOGRAM COMPLETE     Disposition:   FU with Dr. Irish Lack in 6 months - unless studies are abnormal.    Patient is agreeable to this plan and will call if any problems develop in the interim.   Signed: Burtis Junes, RN, ANP-C 01/19/2015 11:13 AM  [facility] [address] [place], [place]  [address] Phone: [phone] Fax: [phone]

## 2015-01-20 ENCOUNTER — Telehealth: Payer: Self-pay | Admitting: Nurse Practitioner

## 2015-01-20 NOTE — Telephone Encounter (Signed)
New Message  Pt returned call for labs

## 2015-01-27 ENCOUNTER — Encounter (INDEPENDENT_AMBULATORY_CARE_PROVIDER_SITE_OTHER): Payer: Medicare Other | Admitting: Ophthalmology

## 2015-01-27 DIAGNOSIS — H43813 Vitreous degeneration, bilateral: Secondary | ICD-10-CM

## 2015-01-27 DIAGNOSIS — H35033 Hypertensive retinopathy, bilateral: Secondary | ICD-10-CM

## 2015-01-27 DIAGNOSIS — H353114 Nonexudative age-related macular degeneration, right eye, advanced atrophic with subfoveal involvement: Secondary | ICD-10-CM | POA: Diagnosis not present

## 2015-01-27 DIAGNOSIS — I10 Essential (primary) hypertension: Secondary | ICD-10-CM

## 2015-01-27 DIAGNOSIS — H353221 Exudative age-related macular degeneration, left eye, with active choroidal neovascularization: Secondary | ICD-10-CM | POA: Diagnosis not present

## 2015-01-28 ENCOUNTER — Telehealth (HOSPITAL_COMMUNITY): Payer: Self-pay | Admitting: *Deleted

## 2015-01-28 NOTE — Telephone Encounter (Signed)
Left message on voicemail in reference to upcoming appointment scheduled for 02/02/15. Phone number given for a call back so details instructions can be given. Hubbard Robinson, RN

## 2015-02-01 ENCOUNTER — Telehealth (HOSPITAL_COMMUNITY): Payer: Self-pay | Admitting: *Deleted

## 2015-02-01 DIAGNOSIS — N1330 Unspecified hydronephrosis: Secondary | ICD-10-CM | POA: Diagnosis not present

## 2015-02-01 NOTE — Telephone Encounter (Signed)
Left message on voicemail in reference to upcoming appointment scheduled for 02/02/15. Phone number given for a call back so details instructions can be given. Hubbard Robinson, RN

## 2015-02-02 ENCOUNTER — Ambulatory Visit (HOSPITAL_BASED_OUTPATIENT_CLINIC_OR_DEPARTMENT_OTHER): Payer: Medicare Other

## 2015-02-02 ENCOUNTER — Ambulatory Visit (HOSPITAL_COMMUNITY): Payer: Medicare Other | Attending: Cardiology

## 2015-02-02 ENCOUNTER — Other Ambulatory Visit: Payer: Self-pay

## 2015-02-02 DIAGNOSIS — I340 Nonrheumatic mitral (valve) insufficiency: Secondary | ICD-10-CM | POA: Diagnosis not present

## 2015-02-02 DIAGNOSIS — I351 Nonrheumatic aortic (valve) insufficiency: Secondary | ICD-10-CM | POA: Diagnosis not present

## 2015-02-02 DIAGNOSIS — R0789 Other chest pain: Secondary | ICD-10-CM | POA: Diagnosis not present

## 2015-02-02 DIAGNOSIS — I272 Other secondary pulmonary hypertension: Secondary | ICD-10-CM | POA: Diagnosis not present

## 2015-02-02 DIAGNOSIS — I10 Essential (primary) hypertension: Secondary | ICD-10-CM | POA: Diagnosis not present

## 2015-02-02 DIAGNOSIS — R0600 Dyspnea, unspecified: Secondary | ICD-10-CM | POA: Diagnosis not present

## 2015-02-02 DIAGNOSIS — I5032 Chronic diastolic (congestive) heart failure: Secondary | ICD-10-CM

## 2015-02-02 DIAGNOSIS — R0602 Shortness of breath: Secondary | ICD-10-CM | POA: Diagnosis not present

## 2015-02-02 DIAGNOSIS — I517 Cardiomegaly: Secondary | ICD-10-CM | POA: Diagnosis not present

## 2015-02-02 DIAGNOSIS — R6889 Other general symptoms and signs: Secondary | ICD-10-CM | POA: Diagnosis not present

## 2015-02-02 DIAGNOSIS — I5189 Other ill-defined heart diseases: Secondary | ICD-10-CM | POA: Insufficient documentation

## 2015-02-02 MED ORDER — TECHNETIUM TC 99M SESTAMIBI GENERIC - CARDIOLITE
27.1000 | Freq: Once | INTRAVENOUS | Status: AC | PRN
  Administered 2015-02-02: 27.1 via INTRAVENOUS

## 2015-02-03 ENCOUNTER — Telehealth: Payer: Self-pay | Admitting: Nurse Practitioner

## 2015-02-03 ENCOUNTER — Ambulatory Visit (HOSPITAL_COMMUNITY): Payer: Medicare Other | Attending: Cardiology

## 2015-02-03 LAB — MYOCARDIAL PERFUSION IMAGING
LV dias vol: 57 mL
LV sys vol: 16 mL
Peak HR: 76 {beats}/min
RATE: 0.4
Rest HR: 52 {beats}/min
SDS: 6
SRS: 0
SSS: 6
TID: 1.04

## 2015-02-03 MED ORDER — REGADENOSON 0.4 MG/5ML IV SOLN
0.4000 mg | Freq: Once | INTRAVENOUS | Status: AC
  Administered 2015-02-02: 0.4 mg via INTRAVENOUS

## 2015-02-03 MED ORDER — TECHNETIUM TC 99M SESTAMIBI GENERIC - CARDIOLITE
31.5000 | Freq: Once | INTRAVENOUS | Status: AC | PRN
  Administered 2015-02-03: 32 via INTRAVENOUS

## 2015-02-03 MED ORDER — AMINOPHYLLINE 25 MG/ML IV SOLN
75.0000 mg | Freq: Once | INTRAVENOUS | Status: AC
  Administered 2015-02-03: 75 mg via INTRAVENOUS

## 2015-02-03 NOTE — Telephone Encounter (Signed)
New problem   Pt stated she is returning your call concerning labs.

## 2015-02-04 DIAGNOSIS — F330 Major depressive disorder, recurrent, mild: Secondary | ICD-10-CM | POA: Diagnosis not present

## 2015-02-08 ENCOUNTER — Other Ambulatory Visit: Payer: Self-pay | Admitting: Interventional Cardiology

## 2015-02-09 ENCOUNTER — Telehealth: Payer: Self-pay | Admitting: *Deleted

## 2015-02-09 NOTE — Telephone Encounter (Signed)
Dr Silverio Decamp, This is a previous Morgan Cooper patient that is scheduled for a PV 12-29 for an EGD with you 1-12 Thursday for a hx of arretts.  She saw cardiology 01-19-2015 and had multiple complaints stating she " didn't feel good and hadn't for awhile"  Cardio did a repeat  An echo on her as she has hx of heart failure that showed EF 55-60 5 but showed an elevated BP in her lungs and she was told to  F/U with her pulmonologist which she has not done. There is no future Pulm appt in epic.  She has an OV scheduled with you on 03-12-2015 at 3 pm.  Do you want to see her at that [facility] prior to this egd or is it ok to proceed as scheduled? Thanks for your time, please advise  Morgan Cooper

## 2015-02-09 NOTE — Telephone Encounter (Signed)
We should have her come for OV first . Thanks

## 2015-02-09 NOTE — Telephone Encounter (Signed)
Called pt and notified her that per Dr Silverio Decamp, due to her echo results  that showed elevated BP in lungs she needs an OV prior to her egd. Pt has OV 03-12-2015 at 3 pm and instructed to keep this and egd will be discussed at this time. Pt instructed her PV 12-29 and egd 1-12 cancelled and keep 1-20 ov. She verbalized understanding   Lenard Galloway RN PV

## 2015-02-16 ENCOUNTER — Encounter: Payer: Self-pay | Admitting: Gastroenterology

## 2015-02-16 ENCOUNTER — Telehealth: Payer: Self-pay | Admitting: *Deleted

## 2015-02-16 ENCOUNTER — Encounter: Payer: Self-pay | Admitting: Nurse Practitioner

## 2015-02-16 NOTE — Telephone Encounter (Signed)
S/w pt's daughter per DPR will set up appointment with Dr. Lamonte Sakai whom pt has seen in the past for pulmonary htn.

## 2015-02-16 NOTE — Telephone Encounter (Signed)
done

## 2015-02-17 ENCOUNTER — Telehealth: Payer: Self-pay | Admitting: *Deleted

## 2015-02-17 NOTE — Telephone Encounter (Signed)
Ok thanks 

## 2015-02-17 NOTE — Telephone Encounter (Signed)
-----   Message -----    From: Lollie Sails    Sent: 02/16/2015 11:26 AM EST      To: Harl Bowie, MD Subject: Non-Urgent Medical Question  Just to follow up. I just spoke to cardiologist's office and they have recommended follow up appt with pulmonologist for mod pulmonary HTN- moderate. I have submitted a request for an appt. Thanks, [name]

## 2015-02-23 ENCOUNTER — Encounter: Payer: Self-pay | Admitting: Acute Care

## 2015-02-24 ENCOUNTER — Encounter: Payer: Self-pay | Admitting: Acute Care

## 2015-02-24 ENCOUNTER — Other Ambulatory Visit: Payer: Medicare Other

## 2015-02-24 ENCOUNTER — Ambulatory Visit (INDEPENDENT_AMBULATORY_CARE_PROVIDER_SITE_OTHER): Payer: Medicare Other | Admitting: Acute Care

## 2015-02-24 VITALS — BP 140/78 | HR 50 | Temp 98.2°F | Ht 65.0 in | Wt 117.8 lb

## 2015-02-24 DIAGNOSIS — R0609 Other forms of dyspnea: Secondary | ICD-10-CM | POA: Diagnosis not present

## 2015-02-24 DIAGNOSIS — R0600 Dyspnea, unspecified: Secondary | ICD-10-CM

## 2015-02-24 DIAGNOSIS — J849 Interstitial pulmonary disease, unspecified: Secondary | ICD-10-CM | POA: Diagnosis not present

## 2015-02-24 DIAGNOSIS — I272 Other secondary pulmonary hypertension: Secondary | ICD-10-CM | POA: Diagnosis not present

## 2015-02-24 DIAGNOSIS — R634 Abnormal weight loss: Secondary | ICD-10-CM

## 2015-02-24 DIAGNOSIS — I2729 Other secondary pulmonary hypertension: Secondary | ICD-10-CM

## 2015-02-24 LAB — RHEUMATOID FACTOR

## 2015-02-24 NOTE — Progress Notes (Signed)
Subjective:    Patient ID: Morgan Cooper, female    DOB: Dec 18, 1945, 70 y.o.   MRN: [id number]  HPI   70 yo former tobacco (2.5pk-yrs) , hx of HTN + diastolic CHF, s/p renal tx on imuran and pred 7.5 qd, GERD / Barrett's / esophageal stricture. She has been having SOB for several years, but certainly worse for the last few months. She has occasional CP both at rest and w exertion. No real cough or sputum.  She has been tried on BD's 11/2014 which she said made no difference to her dyspnea, and she has stopped using them. She has been referred to [place] by [name], NP cardiology as the patient has not been seen by pulmonary in over 1 year, and she has worsening dyspnea, in addition to elevated PAP on recent Echo.   Significant Studies:  Echo 02/02/15  EF: 55-60% PAP:59 mm Hg  02/24/2015 ROV for worsening dyspnea. She tried albuterol ( Pro-Air) after seeing Dr. Lamonte Sakai at the last visit ( 11/2013) . She said it did not help, so she did not continue to use it.She is really interested in knowing the cause of the pulmonary hypertension. Patient did not desaturate below 95% today when walked by nursing staff.We will set her up for an O and O study, and get an HRCT in addition to sending labs to evaluate etiology and degree of disease. Patient has not been seen by pulmonary in greater than 1 year.  Review of Systems Constitutional:   + weight loss, negative night sweats,  Fevers, chills, +fatigue, or  lassitude.  HEENT:   No headaches,  Difficulty swallowing,  Tooth/dental problems, or  Sore throat,                No sneezing, itching, ear ache, nasal congestion, slight post nasal drip,   CV:  Occasional chest pain at rest and with exertion,  negative Orthopnea, PND, swelling in lower extremities, anasarca, dizziness, palpitations, syncope.   GI  No heartburn, indigestion, abdominal pain, nausea, vomiting, diarrhea, change in bowel habits,  + loss of appetite,  + weight loss, negative bloody stools.    Resp: chronic shortness of breath with exertion and at rest.  No excess mucus, no productive cough, occasional non-productive cough,  No coughing up of blood.  No change in color of mucus.  No wheezing. Positive kyphosis.  Skin: no rash or lesions.  GU: no dysuria, change in color of urine, no urgency or frequency.  No flank pain, no hematuria   MS:  No joint pain or swelling.  No decreased range of motion.  No back pain.  Psych:  No change in mood or affect. No depression or anxiety.  No memory loss.        Objective:   Physical Exam   BP 140/78 mmHg  Pulse 50  Temp(Src) 98.2 F (36.8 C) (Oral)  Ht 5\' 5"  (1.651 m)  Wt 117 lb 12.8 oz (53.434 kg)  BMI 19.60 kg/m2  SpO2 97% Physical Exam:  General-  Frail, tired appearing 70 year old female, No distress,  A&Ox3 ENT: No sinus tenderness, TM clear, pale nasal mucosa, no oral exudate,no post nasal drip, no LAN Cardiac: S1, S2, regular rate and rhythm, no murmur Chest: No wheeze/ rales/ dullness; no accessory muscle use, no nasal flaring, no sternal retractions Abd.: Soft Non-tender Ext: No edema Neuro:  normal strength Skin: No rashes, warm and dry Psych: normal mood and behavior  Assessment & Plan:

## 2015-02-24 NOTE — Telephone Encounter (Signed)
Judson Roch, Patient's daughter is requesting a call after her mom's appointment with you tomorrow. See MyChart Message

## 2015-02-24 NOTE — Patient Instructions (Addendum)
We have walked you today to check your oxygen levels when you are exerting yourself. Your oxygen level did not drop below 95%. We will order an oxygen study to be done at home. We are going to order  a CT Scan of your chest to see if we can determine the cause of your pulmonary hypertension. We will do labs today that will help [place] determine the cause of your pulmonary hypertension. Follow up with Morgan Cooper on Jan. 26th, 2017 to discuss the results of the CT scan, oxygen study and labwork.  Please contact office for  earlier follow up if symptoms do not improve or worsen or seek emergency care.

## 2015-02-24 NOTE — Assessment & Plan Note (Addendum)
Recent Echo shows PAP of 59 mm Hg, and EF of 55-60%. Pt has worsening dyspnea  and was referred to [place] by Cardiology for follow up of pulmonary hypertension in light of most recent Echo results.  Plan: O&O testing HRCT to be read by Dr. Entrikin/Bleitz/or Poff. To further evaluate etiology and extent of pulmonary hypertension/ ILD Labwork: ANA, Double stranded DNA, ACE,SCL-70, RF, CCP,SSA/SSB to determine etiology. Return appointment with Dr. Lamonte Sakai 03/18/15 to discuss results and plan. Walk patient to evaluate is she is desaturating with exertion. Update daughter by phone of plan. ( Done 02/24/15)

## 2015-02-24 NOTE — Assessment & Plan Note (Addendum)
Pt. States she has no appetite and is losing weight.  Plan: HRCT to r/o malignancy in former smoker. Encourage nutritional supplements.

## 2015-02-25 LAB — ANTI-SCLERODERMA ANTIBODY: SCLERODERMA (SCL-70) (ENA) ANTIBODY, IGG: NEGATIVE

## 2015-02-25 LAB — SJOGRENS SYNDROME-A EXTRACTABLE NUCLEAR ANTIBODY: SSA (Ro) (ENA) Antibody, IgG: <1

## 2015-02-25 LAB — SJOGRENS SYNDROME-B EXTRACTABLE NUCLEAR ANTIBODY: SSB (La) (ENA) Antibody, IgG: <1

## 2015-02-25 LAB — ANA: ANA: POSITIVE — AB

## 2015-02-25 LAB — ANTI-NUCLEAR AB-TITER (ANA TITER)

## 2015-02-25 LAB — ANTI-DNA ANTIBODY, DOUBLE-STRANDED: DS DNA AB: 3 [IU]/mL

## 2015-02-25 LAB — CYCLIC CITRUL PEPTIDE ANTIBODY, IGG

## 2015-02-25 LAB — ANGIOTENSIN CONVERTING ENZYME: Angiotensin-Converting Enzyme: 33 U/L (ref 8–52)

## 2015-02-26 ENCOUNTER — Ambulatory Visit (INDEPENDENT_AMBULATORY_CARE_PROVIDER_SITE_OTHER)
Admission: RE | Admit: 2015-02-26 | Discharge: 2015-02-26 | Disposition: A | Discharge status: Home / Self Care | Payer: Medicare Other | Source: Ambulatory Visit | Attending: Acute Care | Admitting: Acute Care

## 2015-02-26 DIAGNOSIS — R0600 Dyspnea, unspecified: Secondary | ICD-10-CM

## 2015-02-26 DIAGNOSIS — R911 Solitary pulmonary nodule: Secondary | ICD-10-CM | POA: Diagnosis not present

## 2015-02-26 DIAGNOSIS — J849 Interstitial pulmonary disease, unspecified: Secondary | ICD-10-CM

## 2015-03-03 ENCOUNTER — Encounter (INDEPENDENT_AMBULATORY_CARE_PROVIDER_SITE_OTHER): Payer: Medicare Other | Admitting: Ophthalmology

## 2015-03-04 ENCOUNTER — Encounter (INDEPENDENT_AMBULATORY_CARE_PROVIDER_SITE_OTHER): Payer: Medicare Other | Admitting: Ophthalmology

## 2015-03-04 ENCOUNTER — Encounter: Payer: Medicare Other | Admitting: Gastroenterology

## 2015-03-04 DIAGNOSIS — H353221 Exudative age-related macular degeneration, left eye, with active choroidal neovascularization: Secondary | ICD-10-CM | POA: Diagnosis not present

## 2015-03-04 DIAGNOSIS — H43813 Vitreous degeneration, bilateral: Secondary | ICD-10-CM

## 2015-03-04 DIAGNOSIS — H35033 Hypertensive retinopathy, bilateral: Secondary | ICD-10-CM

## 2015-03-04 DIAGNOSIS — H353114 Nonexudative age-related macular degeneration, right eye, advanced atrophic with subfoveal involvement: Secondary | ICD-10-CM | POA: Diagnosis not present

## 2015-03-04 DIAGNOSIS — I10 Essential (primary) hypertension: Secondary | ICD-10-CM | POA: Diagnosis not present

## 2015-03-06 DIAGNOSIS — R0600 Dyspnea, unspecified: Secondary | ICD-10-CM | POA: Diagnosis not present

## 2015-03-09 ENCOUNTER — Encounter: Payer: Self-pay | Admitting: Gastroenterology

## 2015-03-11 ENCOUNTER — Other Ambulatory Visit: Payer: Medicare Other

## 2015-03-12 ENCOUNTER — Encounter: Payer: Self-pay | Admitting: Gastroenterology

## 2015-03-12 ENCOUNTER — Other Ambulatory Visit: Payer: Self-pay | Admitting: Acute Care

## 2015-03-12 ENCOUNTER — Ambulatory Visit: Payer: Medicare Other | Admitting: Gastroenterology

## 2015-03-12 ENCOUNTER — Telehealth: Payer: Self-pay | Admitting: Acute Care

## 2015-03-12 ENCOUNTER — Ambulatory Visit (INDEPENDENT_AMBULATORY_CARE_PROVIDER_SITE_OTHER): Payer: Medicare Other | Admitting: Gastroenterology

## 2015-03-12 VITALS — BP 120/70 | HR 70 | Ht 65.0 in | Wt 117.8 lb

## 2015-03-12 DIAGNOSIS — K219 Gastro-esophageal reflux disease without esophagitis: Secondary | ICD-10-CM

## 2015-03-12 DIAGNOSIS — R1033 Periumbilical pain: Secondary | ICD-10-CM | POA: Diagnosis not present

## 2015-03-12 DIAGNOSIS — K2270 Barrett's esophagus without dysplasia: Secondary | ICD-10-CM | POA: Diagnosis not present

## 2015-03-12 DIAGNOSIS — K862 Cyst of pancreas: Secondary | ICD-10-CM | POA: Diagnosis not present

## 2015-03-12 DIAGNOSIS — I2729 Other secondary pulmonary hypertension: Secondary | ICD-10-CM

## 2015-03-12 NOTE — Telephone Encounter (Signed)
I called today to give [name] her lab results and her overnight oxygen study results. There was no answer. I have left a message with contact information and asked that she call the office. I have ordered nocturnal oxygen  At 2 L [place] due to nocturnal hypoxia of 50 minutes ( 10.8%) with a sat low of 80%. Please [facility] with [facility] for [facility].She has an appointment with Dr. Lamonte Sakai 03/18/15. Dr. Lamonte Sakai please assess for need for sleep study and possible CPAP when you see the patient 03/18/15.

## 2015-03-12 NOTE — Patient Instructions (Signed)
You will need to go to to the Urgent Care or the ED to make sure you done have a UTI  You have been scheduled for an endoscopy. Please follow written instructions given to you at your visit today. If you use inhalers (even only as needed), please bring them with you on the day of your procedure. Your physician has requested that you go to www.startemmi.com and enter the access code given to you at your visit today. This web site gives a general overview about your procedure. However, you should still follow specific instructions given to you by our office regarding your preparation for the procedure.

## 2015-03-12 NOTE — Telephone Encounter (Signed)
Order has not yet been placed for o2. After speaking with pt, please place order.   ONO was done on 03/06/15. Results placed at front with pending appt paperwork to be available for appt with RB.

## 2015-03-12 NOTE — Progress Notes (Signed)
Morgan Cooper    [id number]    1945-05-05  Primary Care Physician:Shamleffer, Herschell Dimes, MD  Referring Physician: Lottie Dawson, MD [place]  [address], [place] [id number]  Chief complaint:  Suprapubic pain  HPI: 70 year old female status post renal transplant on chronic immunosuppression and prednisone, GERD, Barrett's disease here for follow-up visit. Patient was complaining of pain in the suprapubic area and thinks she may be having urinary infection. She had an episode of urinary infection last year which was complicated requiring multiple day hospitalization. Denies any fevers or chills but does report having cloudy urine since yesterday. She is currently taking PPI twice daily with only occasional breakthrough heartburn. Denies any dysphagia or odynophagia. Does have occasional pain in the mid abdomen radiating to the back in the region of her pancreatic cyst.  Outpatient Encounter Prescriptions as of 03/12/2015  Medication Sig  . atorvastatin (LIPITOR) 20 MG tablet Take 20 mg by mouth daily.   Marland Kitchen azaTHIOprine (IMURAN) 50 MG tablet Take 50 mg by mouth every morning.   . cycloSPORINE (RESTASIS) 0.05 % ophthalmic emulsion Place 1 drop into both eyes 2 (two) times daily.    Marland Kitchen FLUoxetine (PROZAC) 20 MG capsule Take 20 mg by mouth every morning.   . furosemide (LASIX) 20 MG tablet TAKE ONE OR TWO TABLETS DAILY  . HYDROcodone-acetaminophen (NORCO) 10-325 MG per tablet Take 1 tablet by mouth every 6 (six) hours as needed. For pain  . metoprolol (LOPRESSOR) 50 MG tablet TAKE ONE TABLET TWICE DAILY  . omeprazole (PRILOSEC) 20 MG capsule Take 20 mg by mouth 2 (two) times daily.   . predniSONE (DELTASONE) 5 MG tablet Take 7.5 mg by mouth daily with breakfast.   . PRESCRIPTION MEDICATION Injection in left eye monthly for macular degeneration , done at [facility] , Dr Zigmund Daniel  . QUEtiapine (SEROQUEL) 200 MG tablet Take 1 tablet by mouth daily.   No  facility-administered encounter medications on file as of 03/12/2015.    Allergies as of 03/12/2015 - Review Complete 03/12/2015  Allergen Reaction Noted  . Amoxicillin Diarrhea 05/20/2014    Past Medical History  Diagnosis Date  . Renal failure   . Diverticulitis of large intestine with perforation Hx colectomy  . Hypertension   . Barrett's esophagus   . Hyperlipemia   . Esophageal stricture   . CVA (cerebral infarction)     [place] Strokes   . Pancreatic cyst   . Gastroparesis   . Depression   . Heart failure, diastolic, chronic ([place])   . Persistent headaches   . Rapid heart rate     some times  . GERD (gastroesophageal reflux disease)   . Forgetfulness   . Irritable   . Sleep difficulties   . Bone pain   . Osteoporosis   . Fatigue   . Disturbed concentration   . Itch     skin  . Nausea     little vomitting  . TIA (transient ischemic attack)   . Macular degeneration   . Keratosis   . Anxiety   . Panic attacks   . Bruises easily   . Chills   . Generalized headaches   . Stroke ([place])   . Hx of cardiovascular stress test     Lexiscan Myoview (06/2013):  No ischemia, EF 72%; Low Risk  . CHF (congestive heart failure) ([place])   . Pulmonary hypertension [facility])     Past Surgical  History  Procedure Laterality Date  . Kidney transplant  1984  . Appendectomy    . Colon resection  2010    Colostomy  . Colostomy  2011    Reversal   . Colostomy reversal    . Left arm surgery      due to injury  . Cataract surgery bilateral    . Esophageal dilation    . Parathyroidectomy  11/04/2010    left inferior parathyroid    Family History  Problem Relation Age of Onset  . Lung cancer Sister   . Cancer    . Heart disease Mother   . Stroke Father   . Aneurysm Brother   . Colon cancer Maternal Aunt 32  . Esophageal cancer Neg Hx   . Rectal cancer Neg Hx   . Stomach cancer Neg Hx     Social History   Social History  . Marital Status: Widowed    Spouse Name: N/A  .  Number of Children: 2  . Years of Education: 12   Occupational History  . Retired    Social History Main Topics  . Smoking status: Former Smoker -- 1.00 packs/day for 5 years    Types: Cigarettes    Quit date: 05/30/1989  . Smokeless tobacco: Never Used  . Alcohol Use: No  . Drug Use: No  . Sexual Activity: Not on file   Other Topics Concern  . Not on file   Social History Narrative   Patient is widowed with 2 children   Patient is right handed   Patient has a high school education   Patient drinks 3 cups daily            Review of systems: Review of Systems  Constitutional: Negative for fever and chills.  HENT: Negative.   Eyes: Negative for blurred vision.  Respiratory: Negative for cough, shortness of breath and wheezing.   Cardiovascular: Negative for chest pain and palpitations.  Gastrointestinal: as per HPI Genitourinary: Negative for dysuria, urgency, frequency and hematuria.  Musculoskeletal: Negative for myalgias, back pain and joint pain.  Skin: Negative for itching and rash.  Neurological: Negative for dizziness, tremors, focal weakness, seizures and loss of consciousness.  Endo/Heme/Allergies: Negative for environmental allergies.  Psychiatric/Behavioral: Negative for depression, suicidal ideas and hallucinations.  All other systems reviewed and are negative.   Physical Exam: Filed Vitals:   03/12/15 1508  BP: 120/70  Pulse: 70   Gen:      No acute distress HEENT:  EOMI, sclera anicteric Neck:     No masses; no thyromegaly Lungs:    Clear to auscultation bilaterally; normal respiratory effort CV:         Regular rate and rhythm; no murmurs Abd:      + bowel sounds; soft, non-tender; no palpable masses, no distension Ext:    No edema; adequate peripheral perfusion Skin:      Warm and dry; no rash Neuro: alert and oriented x 3 Psych: normal mood and affect  Data Reviewed:  CT abd & pelvis May 2016 1. Progressive collecting system dilatation  of the transplanted kidney in the left iliac fossa compared with prior CT concerning for possible obstructive uropathy. No calculus identified. If the patient is not able to receive intravenous contrast, cystoscopy and retrograde evaluation of the ureter may be helpful. 2. Mild nonspecific perinephric soft tissue stranding could be secondary to ureteral obstruction or inflammation. No focal perinephric fluid collection identified. 3. Stable marked atrophy of the native  kidneys. 4. Continued slow growth of large complex mass involving the pancreatic tail. 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 5. Stable extensive atherosclerosis and infrarenal abdominal aortic aneurysm.   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 Denzil Magnuson , MD [phone] Mon-Fri 8a-5p [phone] after 5p, weekends, holidays

## 2015-03-12 NOTE — Progress Notes (Signed)
Error

## 2015-03-15 NOTE — Telephone Encounter (Signed)
Pt returning call.Morgan Cooper ° °

## 2015-03-15 NOTE — Telephone Encounter (Signed)
Called spoke with pt. Aware of ONO results. Order placed for O2. She is also requesting her lab results.  Please advise Judson Roch. thanks

## 2015-03-18 ENCOUNTER — Telehealth: Payer: Self-pay | Admitting: Acute Care

## 2015-03-18 ENCOUNTER — Ambulatory Visit (INDEPENDENT_AMBULATORY_CARE_PROVIDER_SITE_OTHER): Payer: Medicare Other | Admitting: Emergency Medicine

## 2015-03-18 ENCOUNTER — Encounter: Payer: Self-pay | Admitting: Emergency Medicine

## 2015-03-18 VITALS — BP 110/62 | HR 71 | Wt 119.0 lb

## 2015-03-18 DIAGNOSIS — J849 Interstitial pulmonary disease, unspecified: Secondary | ICD-10-CM

## 2015-03-18 DIAGNOSIS — I272 Other secondary pulmonary hypertension: Secondary | ICD-10-CM | POA: Diagnosis not present

## 2015-03-18 DIAGNOSIS — I2729 Other secondary pulmonary hypertension: Secondary | ICD-10-CM

## 2015-03-18 DIAGNOSIS — J449 Chronic obstructive pulmonary disease, unspecified: Secondary | ICD-10-CM

## 2015-03-18 DIAGNOSIS — G4734 Idiopathic sleep related nonobstructive alveolar hypoventilation: Secondary | ICD-10-CM | POA: Diagnosis not present

## 2015-03-18 MED ORDER — PREDNISONE 10 MG PO TABS: 10.0000 mg | ORAL_TABLET | Freq: Every day | ORAL | Status: DC

## 2015-03-18 NOTE — Telephone Encounter (Signed)
Morgan Cooper has tried calling her twice. Pt has appt at 1:30 today w/ RB and will review those with her then. Will sign off.

## 2015-03-18 NOTE — Assessment & Plan Note (Signed)
Clear evidence of obstruction on her pulmonary function testing. She's never responded to bronchodilators probably because she has other contributing factors that may outweigh this component of her dyspnea. I will defer bronchodilators for now but we may decide to pursue this again in the future

## 2015-03-18 NOTE — Progress Notes (Signed)
Subjective:    Patient ID: Morgan Cooper, female    DOB: 08-31-1945, 70 y.o.   MRN: [id number]  Shortness of Breath Associated symptoms include wheezing. Pertinent negatives include no ear pain, fever, headaches, leg swelling, rash, rhinorrhea, sore throat or vomiting.   69 yo former tobacco ( 16 pk-yrs) , hx of HTN + diastolic CHF, s/p renal tx on imuran and pred 7.5 qd, GERD / Barrett's / esophageal stricture. She has been having SOB for several years, but certainly worse for the last few months. She has occasional CP both at rest and w exertion. Dr Irish Lack performed a low risk Lexiscan in 5/'15. Occasionally some wheeze. No real cough or sputum. She is on a higher dose metoprolol, starting 1 month ago. She has been tried on BD's in the distant past, not recently.   ROV 04/09/14 -- follow-up visit for dyspnea. She has mixed obstruction and restriction on her function testing so we tried albuterol when necessary to see if she would benefit. She used it about 10 times, didn't notice any real changes in her breathing. She is having back pain and leg pain that play a role.   02/24/15 with [name] --  70 yo former tobacco, hx of HTN + diastolic CHF, s/p renal tx on imuran and pred 7.5 qd, GERD / Barrett's / esophageal stricture. She has been having SOB for several years, but certainly worse for the last few months. She has occasional CP both at rest and w exertion. No real cough or sputum. She has been tried on BD's 11/2014 which she said made no difference to her dyspnea, and she has stopped using them. She has been referred to [place] by [name], NP cardiology as the patient has not been seen by pulmonary in over 1 year, and she has worsening dyspnea, in addition to elevated PAP on recent Echo  03/18/15 -- follow-up visit for history of dyspnea that is multifactorial in the setting of obstructive disease, restrictive disease, diastolic CHF. She also has a history of renal transplant on Imuran and  prednisone. She underwent cardiac stress testing on 02/03/15 that was reassuring. An echocardiogram from 12/13/216 showed normal systolic function, grade 2 diastolic dysfunction, normal right ventricular size and function with an estimated PASP of 59 mmHg. Based on increasing dyspnea over the last several months and and the pulmonary hypertension suggested by her echocardiogram overnight oximetry was performed that confirmed desaturation. She also underwent immune testing that showed a weakly positive ANA (1:80), negative dsDNA, normal ACE level, negative scleroderma antibody, negative RF, negative Sjogren's antibodies. A high-resolution CT scan of the chest performed on 02/26/15 that I have personally reviewed. This shows mild anterior basilar interstitial changes without significant groundglass. There is some traction bronchiectasis and evidence of mild honeycomb change. There is a solitary 3 mm left upper lobe pulmonary nodule that needs to be followed in one year. She tells me that she was on nitrofurantoin from Spring into the fall 2016 for chronic UTI's. She has a family hx lung CA.  Her ONO showed desaturations.    Review of Systems  Constitutional: Negative for fever and unexpected weight change.  HENT: Positive for trouble swallowing. Negative for congestion, dental problem, ear pain, nosebleeds, postnasal drip, rhinorrhea, sinus pressure, sneezing and sore throat.   Eyes: Negative for redness and itching.  Respiratory: Positive for cough, shortness of breath and wheezing. Negative for chest tightness.   Cardiovascular: Negative for palpitations and leg swelling.  Gastrointestinal: Negative for  nausea and vomiting.  Genitourinary: Negative for dysuria.  Musculoskeletal: Negative for joint swelling and arthralgias.  Skin: Negative for rash.  Neurological: Negative for headaches.  Hematological: Does not bruise/bleed easily.  Psychiatric/Behavioral: Negative for dysphoric mood. The patient is  nervous/anxious.        Objective:   Physical Exam Filed Vitals:   03/18/15 1332  BP: 110/62  Pulse: 71  Weight: 119 lb (53.978 kg)  SpO2: 92%   Gen: Pleasant, well-nourished, in no distress,  normal affect  ENT: No lesions,  mouth clear,  oropharynx clear, no postnasal drip  Neck: No JVD, no TMG, no carotid bruits  Lungs: No use of accessory muscles, clear without rales or rhonchi  Cardiovascular: RRR, heart sounds normal, no murmur or gallops, no peripheral edema  Musculoskeletal: No deformities, no cyanosis or clubbing  Neuro: alert, non focal  Skin: Warm, no lesions or rashes      Assessment & Plan:  Pulmonary hypertension assoc with unclear multi-factorial mechanisms (HCC) Suspect that this is multifactorial in the setting of diastolic CHF and hypertension, restrictive lung disease, obstructive lung disease is identified by pulmonary function testing, and possibly nocturnal hypoxemia with sleep disordered breathing. An autoimmune panel done earlier this month is largely negative with the exception of a weakly positive ANA. Echocardiogram shows intact RV size and function despite elevated estimated PASP. I believe we should try to identified and treat the potential underlying factors, then consider and discuss right heart catheterization to definitively characterize her PA pressures. We will assess a sleep study as below, evaluate her ILD, consider treating her obstructive disease although she hasn't responded bronchodilators in the past.   Interstitial lung disease ([place]) Etiology unclear. Her autoimmune panel was negative. She does have a history of Barrett's esophagus with frequent reflux and certainly chronic aspiration could be a cause. He has had progression of interstitial changes based on her most recent CT scan. Her dyspnea and her changes on CT relate to nitrofurantoin exposure - she was on this for about 4 months in the spring and summer of 2016. It was since  stopped. Typically groundglass and dyspnea improved with removal of the medication. I would like to treat her with moderate dose prednisone to see if she improves. Will increase her to 30 mg daily for 4 weeks and then decrease back to her usual 7.5 mg daily.   Obstructive lung disease ([place]) Clear evidence of obstruction on her pulmonary function testing. She's never responded to bronchodilators probably because she has other contributing factors that may outweigh this component of her dyspnea. I will defer bronchodilators for now but we may decide to pursue this again in the future  Nocturnal hypoxemia As documented by her recent ONO. I believe she needs a sleep study to further characterize. She was somewhat reticent to do this but in the end agreed. We will order split-night sleep study and assess for obstructive sleep apnea

## 2015-03-18 NOTE — Telephone Encounter (Signed)
I called to give the patient the lab results she requested. Left patient a message as there was no answer. I explained that we did have her lab results back, and that we will review them with her today at her office appointment with Dr. Lamonte Sakai. She has the office number if she would like to call before her afternoon appointment.

## 2015-03-18 NOTE — Telephone Encounter (Signed)
Mindy, will you please check with Judson Roch this morning regarding lab results?  Thank you.

## 2015-03-18 NOTE — Assessment & Plan Note (Signed)
Suspect that this is multifactorial in the setting of diastolic CHF and hypertension, restrictive lung disease, obstructive lung disease is identified by pulmonary function testing, and possibly nocturnal hypoxemia with sleep disordered breathing. An autoimmune panel done earlier this month is largely negative with the exception of a weakly positive ANA. Echocardiogram shows intact RV size and function despite elevated estimated PASP. I believe we should try to identified and treat the potential underlying factors, then consider and discuss right heart catheterization to definitively characterize her PA pressures. We will assess a sleep study as below, evaluate her ILD, consider treating her obstructive disease although she hasn't responded bronchodilators in the past.

## 2015-03-18 NOTE — Assessment & Plan Note (Signed)
As documented by her recent ONO. I believe she needs a sleep study to further characterize. She was somewhat reticent to do this but in the end agreed. We will order split-night sleep study and assess for obstructive sleep apnea

## 2015-03-18 NOTE — Assessment & Plan Note (Signed)
Etiology unclear. Her autoimmune panel was negative. She does have a history of Barrett's esophagus with frequent reflux and certainly chronic aspiration could be a cause. He has had progression of interstitial changes based on her most recent CT scan. Her dyspnea and her changes on CT relate to nitrofurantoin exposure - she was on this for about 4 months in the spring and summer of 2016. It was since stopped. Typically groundglass and dyspnea improved with removal of the medication. I would like to treat her with moderate dose prednisone to see if she improves. Will increase her to 30 mg daily for 4 weeks and then decrease back to her usual 7.5 mg daily.

## 2015-03-18 NOTE — Patient Instructions (Signed)
Please increase prednisone to 30 mg daily for the next month. After a month and we will go back to your usual 7.5 mg daily Continue Imuran as you have been taking it We will perform a sleep study For now please use oxygen at 2 L/m while sleeping Depending on her response to prednisone and the results of your other studies we may decide to refer you to cardiology for direct measurement of your pulmonary blood pressures Follow with Dr Lamonte Sakai in 1 month Or next available

## 2015-03-20 ENCOUNTER — Ambulatory Visit (HOSPITAL_BASED_OUTPATIENT_CLINIC_OR_DEPARTMENT_OTHER): Payer: Medicare Other | Attending: Emergency Medicine

## 2015-03-20 VITALS — Ht 65.0 in | Wt 115.0 lb

## 2015-03-20 DIAGNOSIS — I2729 Other secondary pulmonary hypertension: Secondary | ICD-10-CM

## 2015-03-20 DIAGNOSIS — R0683 Snoring: Secondary | ICD-10-CM | POA: Diagnosis not present

## 2015-03-20 DIAGNOSIS — I272 Other secondary pulmonary hypertension: Secondary | ICD-10-CM | POA: Diagnosis not present

## 2015-03-20 DIAGNOSIS — G4733 Obstructive sleep apnea (adult) (pediatric): Secondary | ICD-10-CM

## 2015-03-20 DIAGNOSIS — G4736 Sleep related hypoventilation in conditions classified elsewhere: Secondary | ICD-10-CM | POA: Insufficient documentation

## 2015-03-20 DIAGNOSIS — I493 Ventricular premature depolarization: Secondary | ICD-10-CM | POA: Diagnosis not present

## 2015-03-21 ENCOUNTER — Encounter: Payer: Self-pay | Admitting: Emergency Medicine

## 2015-03-22 MED ORDER — PREDNISONE 10 MG PO TABS: 30.0000 mg | ORAL_TABLET | Freq: Every day | ORAL | Status: DC

## 2015-03-22 NOTE — Telephone Encounter (Signed)
1.30.17 mychart message from pt's mother: Message     Thanks so much. The sleep study has a visit summary from 03/20/15 that's says dx of obstructive sleep apnea but test results in test section. Is that dx their finding?   Per the 1.28.17 ov with RB: Nocturnal hypoxemia - [name], MD at 03/18/2015 6:04 PM     Status: Written Related Problem: Nocturnal hypoxemia   Expand All Collapse All   As documented by her recent ONO. I believe she needs a sleep study to further characterize. She was somewhat reticent to do this but in the end agreed. We will order split-night sleep study and assess for obstructive sleep apnea       Mychart message sent to pt's daughter informing her that the sleep study was ordered to assess pt for OSA and that the results will not be back for approx 2 weeks to determine it pt has this diagnosis or not.

## 2015-03-24 DIAGNOSIS — G4733 Obstructive sleep apnea (adult) (pediatric): Secondary | ICD-10-CM | POA: Diagnosis not present

## 2015-03-24 NOTE — Progress Notes (Signed)
Patient Name: Morgan Cooper, Morgan Cooper Date: 03/20/2015 Gender: Female D.O.B: 06-07-1945 Age (years): 87 Referring Provider: Baltazar Apo Height (inches): 1 Interpreting Physician: Kara Mead MD, ABSM Weight (lbs): 115 RPSGT: Madelon Lips BMI: 19 MRN: [id number] Neck Size: 12.50   CLINICAL INFORMATION Sleep Study Type: NPSG Indication for sleep study: Pulmonary Hypertension, Nocturnal hypoxemia noted on oximetry Epworth Sleepiness Score: 6   SLEEP STUDY TECHNIQUE As per the AASM Manual for the Scoring of Sleep and Associated Events v2.3 (April 2016) with a hypopnea requiring 4% desaturations. The channels recorded and monitored were frontal, central and occipital EEG, electrooculogram (EOG), submentalis EMG (chin), nasal and oral airflow, thoracic and abdominal wall motion, anterior tibialis EMG, snore microphone, electrocardiogram, and pulse oximetry.   MEDICATIONS Patient's medications include: ACETAMINOPHEN-HYDROCODONE, METOPROLOL, SEROQUEL. Medications self-administered by patient during sleep study : No sleep medicine administered.   SLEEP ARCHITECTURE The study was initiated at 10:32:45 PM and ended at 5:00:26 AM. Sleep onset time was 12.6 minutes and the sleep efficiency was 82.4%. The total sleep time was 319.6 minutes. Stage REM latency was 258.5 minutes. The patient spent 8.78% of the night in stage N1 sleep, 83.86% in stage N2 sleep, 0.00% in stage N3 and 7.35% in REM. Alpha intrusion was absent. Supine sleep was 23.13%.   RESPIRATORY PARAMETERS The overall apnea/hypopnea index (AHI) was 1.3 per hour. There were 0 total apneas, including 0 obstructive, 0 central and 0 mixed apneas. There were 7 hypopneas and 8 RERAs. The AHI during Stage REM sleep was 5.1 per hour. AHI while supine was 5.7 per hour. The mean oxygen saturation was 95.35%. The minimum SpO2 during sleep was 81.00%. Loud snoring was noted during this study.   CARDIAC DATA The 2 lead EKG demonstrated  sinus rhythm. The mean heart rate was 47.90 beats per minute. Other EKG findings include: PVCs.   LEG MOVEMENT DATA The total PLMS were 0 with a resulting PLMS index of 0.00. Associated arousal with leg movement index was 0.0 .   IMPRESSIONS - No significant obstructive sleep apnea occurred during this study (AHI = 1.3/h). - No significant central sleep apnea occurred during this study (CAI = 0.0/h). - Mild oxygen desaturation was noted during this study (Min O2 = 81.00%). 1 L of oxygen was initiated due to desaturations noted without respiratory events. - The patient snored with Loud snoring volume. - EKG findings include PVCs. - Clinically significant periodic limb movements did not occur during sleep. No significant associated arousals.   DIAGNOSIS - Nocturnal Hypoxemia (327.26 [G47.36 ICD-10])   RECOMMENDATIONS - 1 L of oxygen can be used during sleep - Avoid alcohol, sedatives and other CNS depressants that may worsen sleep apnea and disrupt normal sleep architecture. - Sleep hygiene should be reviewed to assess factors that may improve sleep quality. - Weight management and regular exercise should be initiated or continued if appropriate.  Kara Mead MD. [place]. [facility]

## 2015-03-29 DIAGNOSIS — M5442 Lumbago with sciatica, left side: Secondary | ICD-10-CM | POA: Diagnosis not present

## 2015-03-29 DIAGNOSIS — G8929 Other chronic pain: Secondary | ICD-10-CM | POA: Diagnosis not present

## 2015-03-29 DIAGNOSIS — G894 Chronic pain syndrome: Secondary | ICD-10-CM | POA: Diagnosis not present

## 2015-03-29 DIAGNOSIS — Z79891 Long term (current) use of opiate analgesic: Secondary | ICD-10-CM | POA: Diagnosis not present

## 2015-04-02 DIAGNOSIS — N1330 Unspecified hydronephrosis: Secondary | ICD-10-CM | POA: Diagnosis not present

## 2015-04-02 DIAGNOSIS — Z0000 Encounter for general adult medical examination without abnormal findings: Secondary | ICD-10-CM | POA: Diagnosis not present

## 2015-04-02 DIAGNOSIS — N3020 Other chronic cystitis without hematuria: Secondary | ICD-10-CM | POA: Diagnosis not present

## 2015-04-02 DIAGNOSIS — N390 Urinary tract infection, site not specified: Secondary | ICD-10-CM | POA: Diagnosis not present

## 2015-04-06 ENCOUNTER — Encounter: Payer: Self-pay | Admitting: Emergency Medicine

## 2015-04-07 ENCOUNTER — Encounter (INDEPENDENT_AMBULATORY_CARE_PROVIDER_SITE_OTHER): Payer: Medicare Other | Admitting: Ophthalmology

## 2015-04-07 DIAGNOSIS — I10 Essential (primary) hypertension: Secondary | ICD-10-CM

## 2015-04-07 DIAGNOSIS — H35033 Hypertensive retinopathy, bilateral: Secondary | ICD-10-CM | POA: Diagnosis not present

## 2015-04-07 DIAGNOSIS — H353221 Exudative age-related macular degeneration, left eye, with active choroidal neovascularization: Secondary | ICD-10-CM | POA: Diagnosis not present

## 2015-04-07 DIAGNOSIS — H353114 Nonexudative age-related macular degeneration, right eye, advanced atrophic with subfoveal involvement: Secondary | ICD-10-CM | POA: Diagnosis not present

## 2015-04-07 DIAGNOSIS — H43813 Vitreous degeneration, bilateral: Secondary | ICD-10-CM | POA: Diagnosis not present

## 2015-04-14 DIAGNOSIS — Z940 Kidney transplant status: Secondary | ICD-10-CM | POA: Diagnosis not present

## 2015-04-14 DIAGNOSIS — I509 Heart failure, unspecified: Secondary | ICD-10-CM | POA: Diagnosis not present

## 2015-04-14 DIAGNOSIS — I272 Other secondary pulmonary hypertension: Secondary | ICD-10-CM | POA: Diagnosis not present

## 2015-04-14 DIAGNOSIS — Z1389 Encounter for screening for other disorder: Secondary | ICD-10-CM | POA: Diagnosis not present

## 2015-04-14 DIAGNOSIS — I10 Essential (primary) hypertension: Secondary | ICD-10-CM | POA: Diagnosis not present

## 2015-04-14 DIAGNOSIS — N3010 Interstitial cystitis (chronic) without hematuria: Secondary | ICD-10-CM | POA: Diagnosis not present

## 2015-04-14 DIAGNOSIS — N185 Chronic kidney disease, stage 5: Secondary | ICD-10-CM | POA: Diagnosis not present

## 2015-04-14 DIAGNOSIS — D638 Anemia in other chronic diseases classified elsewhere: Secondary | ICD-10-CM | POA: Diagnosis not present

## 2015-04-14 DIAGNOSIS — L989 Disorder of the skin and subcutaneous tissue, unspecified: Secondary | ICD-10-CM | POA: Diagnosis not present

## 2015-04-14 DIAGNOSIS — Z0001 Encounter for general adult medical examination with abnormal findings: Secondary | ICD-10-CM | POA: Diagnosis not present

## 2015-04-14 DIAGNOSIS — J849 Interstitial pulmonary disease, unspecified: Secondary | ICD-10-CM | POA: Diagnosis not present

## 2015-04-14 DIAGNOSIS — N2581 Secondary hyperparathyroidism of renal origin: Secondary | ICD-10-CM | POA: Diagnosis not present

## 2015-04-20 ENCOUNTER — Ambulatory Visit (AMBULATORY_SURGERY_CENTER): Payer: Medicare Other | Admitting: Gastroenterology

## 2015-04-20 ENCOUNTER — Encounter: Payer: Self-pay | Admitting: Gastroenterology

## 2015-04-20 VITALS — BP 140/58 | HR 50 | Temp 98.4°F | Resp 22 | Ht 65.0 in | Wt 117.0 lb

## 2015-04-20 DIAGNOSIS — K2940 Chronic atrophic gastritis without bleeding: Secondary | ICD-10-CM | POA: Diagnosis not present

## 2015-04-20 DIAGNOSIS — K2990 Gastroduodenitis, unspecified, without bleeding: Secondary | ICD-10-CM

## 2015-04-20 DIAGNOSIS — K2270 Barrett's esophagus without dysplasia: Secondary | ICD-10-CM

## 2015-04-20 DIAGNOSIS — J449 Chronic obstructive pulmonary disease, unspecified: Secondary | ICD-10-CM | POA: Diagnosis not present

## 2015-04-20 DIAGNOSIS — K2970 Gastritis, unspecified, without bleeding: Secondary | ICD-10-CM

## 2015-04-20 MED ORDER — SODIUM CHLORIDE 0.9 % IV SOLN: 500.0000 mL | INTRAVENOUS | Status: DC

## 2015-04-20 NOTE — Op Note (Signed)
[place]  [address]. [place], [address]   ENDOSCOPY PROCEDURE REPORT  PATIENT: Morgan, Cooper  MR#: [id number] BIRTHDATE: [birth year] , 6  yrs. old GENDER: female ENDOSCOPIST: Harl Bowie, MD REFERRED BY:  Dr Kelton Pillar PROCEDURE DATE:  04/20/2015 PROCEDURE:  EGD w/ biopsy and EGD w/ biopsy for H.pylori ASA CLASS:     Class III INDICATIONS:  follow up of Barrett's esophagus and surveillance. MEDICATIONS: Propofol 80 mg IV TOPICAL ANESTHETIC: none  DESCRIPTION OF PROCEDURE: After the risks benefits and alternatives of the procedure were thoroughly explained, informed consent was obtained.  The LB [id number] endoscope was introduced through the mouth and advanced to the second portion of the duodenum , Without limitations.  The instrument was slowly withdrawn as the mucosa was fully examined.    Esophageal mucosa appeared normal.  Irregular Z-line with a very short segment of Barrett's mucosa  1-2cm, biopsies were obtained. Moderate gastritis diffusely through out the gastric mucosa, random biopsies were obtained.  Duodenum appeared normal.  Retroflexed views revealed a hiatal hernia.     The scope was then withdrawn from the patient and the procedure completed.  COMPLICATIONS: There were no immediate complications.  ENDOSCOPIC IMPRESSION: Esophageal mucosa appeared normal.  Irregular Z-line with a very short segment of Barrett's mucosa  1-2cm, biopsies were obtained. Moderate gastritis diffusely through out the gastric mucosa, random biopsies were obtained.  Duodenum appeared normal  RECOMMENDATIONS: 1.  Await biopsy results 2.  Anti-reflux regimen to be follow 3.  Continue PPI 4.  Avoid NSAIDS   eSigned:  Harl Bowie, MD 04/20/2015 10:37 AM

## 2015-04-20 NOTE — Progress Notes (Signed)
A/ox3 pleased with MAC, report to [facility], dentition as pre-procedure

## 2015-04-20 NOTE — Patient Instructions (Signed)
YOU HAD AN ENDOSCOPIC PROCEDURE TODAY AT [facility]:   Refer to the procedure report that was given to you for any specific questions about what was found during the examination.  If the procedure report does not answer your questions, please call your gastroenterologist to clarify.  If you requested that your care partner not be given the details of your procedure findings, then the procedure report has been included in a sealed envelope for you to review at your convenience later.  Please Note:  You might notice some irritation and congestion in your nose or some drainage.  This is from the oxygen used during your procedure.  There is no need for concern and it should clear up in a day or so.  SYMPTOMS TO REPORT IMMEDIATELY:    Following upper endoscopy (EGD)  Vomiting of blood or coffee ground material  New chest pain or pain under the shoulder blades  Painful or persistently difficult swallowing  New shortness of breath  Fever of 100F or higher  Black, tarry-looking stools  For urgent or emergent issues, a gastroenterologist can be reached at any hour by calling [phone].   DIET: Your first meal following the procedure should be a small meal and then it is ok to progress to your normal diet. Heavy or fried foods are harder to digest and may make you feel nauseous or bloated.  Likewise, meals heavy in dairy and vegetables can increase bloating.  Drink plenty of fluids but you should avoid alcoholic beverages for 24 hours.  ACTIVITY:  You should plan to take it easy for the rest of today and you should NOT DRIVE or use heavy machinery until tomorrow (because of the sedation medicines used during the test).    FOLLOW UP: Our staff will call the number listed on your records the next business day following your procedure to check on you and address any questions or concerns that you may have regarding the information given to you following your procedure. If we do not  reach you, we will leave a message.  However, if you are feeling well and you are not experiencing any problems, there is no need to return our call.  We will assume that you have returned to your regular daily activities without incident.  If any biopsies were taken you will be contacted by phone or by letter within the next 1-3 weeks.  Please call us at [phone] if you have not heard about the biopsies in 3 weeks.    SIGNATURES/CONFIDENTIALITY: You and/or your care partner have signed paperwork which will be entered into your electronic medical record.  These signatures attest to the fact that that the information above on your After Visit Summary has been reviewed and is understood.  Full responsibility of the confidentiality of this discharge information lies with you and/or your care-partner.  Await biopsy results Avoid NSAIDS for 2 weeks Continue taking Prilosec

## 2015-04-20 NOTE — Progress Notes (Signed)
Dental advisory given to patient 

## 2015-04-20 NOTE — Progress Notes (Signed)
Called to room to assist during endoscopic procedure.  Patient ID and intended procedure confirmed with present staff. Received instructions for my participation in the procedure from the performing physician.  

## 2015-04-20 NOTE — Progress Notes (Addendum)
On admission, patient stating she does use 02 at home at bedtime, 7 hours per night and occasionally during the day. None used yesterday or today during the day. Informed Osvaldo Angst CRNA and Dr. Alphonsus Sias RN, Nurse Manager.All parties in agreement to proceed with procedure.

## 2015-04-21 ENCOUNTER — Telehealth: Payer: Self-pay

## 2015-04-21 NOTE — Telephone Encounter (Signed)
  Follow up Call-  Call back number 04/20/2015 05/29/2013  Post procedure Call Back phone  # [phone]  Permission to leave phone message Yes Yes    Patient was called for follow up after her procedure on 04/20/2015. No answer at the number given for follow up. A message was left on the answering machine.

## 2015-04-26 ENCOUNTER — Ambulatory Visit (INDEPENDENT_AMBULATORY_CARE_PROVIDER_SITE_OTHER): Payer: Medicare Other | Admitting: Emergency Medicine

## 2015-04-26 ENCOUNTER — Encounter: Payer: Self-pay | Admitting: Emergency Medicine

## 2015-04-26 VITALS — BP 140/80 | HR 64 | Ht 65.0 in | Wt 124.0 lb

## 2015-04-26 DIAGNOSIS — J449 Chronic obstructive pulmonary disease, unspecified: Secondary | ICD-10-CM | POA: Diagnosis not present

## 2015-04-26 DIAGNOSIS — J849 Interstitial pulmonary disease, unspecified: Secondary | ICD-10-CM

## 2015-04-26 DIAGNOSIS — G4734 Idiopathic sleep related nonobstructive alveolar hypoventilation: Secondary | ICD-10-CM | POA: Diagnosis not present

## 2015-04-26 DIAGNOSIS — C44629 Squamous cell carcinoma of skin of left upper limb, including shoulder: Secondary | ICD-10-CM | POA: Diagnosis not present

## 2015-04-26 DIAGNOSIS — D485 Neoplasm of uncertain behavior of skin: Secondary | ICD-10-CM | POA: Diagnosis not present

## 2015-04-26 NOTE — Assessment & Plan Note (Signed)
Her sleep study did not show any evidence of sleep apnea. She does have nocturnal hypoxemia which is adequately treated with 1 L/m. We will decrease her dose to 1 L

## 2015-04-26 NOTE — Progress Notes (Signed)
Subjective:    Patient ID: Morgan Cooper, female    DOB: 1946-01-28, 70 y.o.   MRN: [id number]  Shortness of Breath Associated symptoms include wheezing. Pertinent negatives include no ear pain, fever, headaches, leg swelling, rash, rhinorrhea, sore throat or vomiting.   70  yo former tobacco ( 16 pk-yrs) , hx of HTN + diastolic CHF, s/p renal tx on imuran and pred 7.5 qd, GERD / Barrett's / esophageal stricture. She has been having SOB for several years, but certainly worse for the last few months. She has occasional CP both at rest and w exertion. Dr Irish Lack performed a low risk Lexiscan in 5/'15. Occasionally some wheeze. No real cough or sputum. She is on a higher dose metoprolol, starting 1 month ago. She has been tried on BD's in the distant past, not recently.   ROV 04/09/14 -- follow-up visit for dyspnea. She has mixed obstruction and restriction on her function testing so we tried albuterol when necessary to see if she would benefit. She used it about 10 times, didn't notice any real changes in her breathing. She is having back pain and leg pain that play a role.   02/24/15 with Morgan Cooper --  70 yo former tobacco, hx of HTN + diastolic CHF, s/p renal tx on imuran and pred 7.5 qd, GERD / Barrett's / esophageal stricture. She has been having SOB for several years, but certainly worse for the last few months. She has occasional CP both at rest and w exertion. No real cough or sputum. She has been tried on BD's 11/2014 which she said made no difference to her dyspnea, and she has stopped using them. She has been referred to [place] by [name], NP cardiology as the patient has not been seen by pulmonary in over 1 year, and she has worsening dyspnea, in addition to elevated PAP on recent Echo  03/18/15 -- follow-up visit for history of dyspnea that is multifactorial in the setting of obstructive disease, restrictive disease, diastolic CHF. She also has a history of renal transplant on Imuran and  prednisone. She underwent cardiac stress testing on 02/03/15 that was reassuring. An echocardiogram from 12/13/216 showed normal systolic function, grade 2 diastolic dysfunction, normal right ventricular size and function with an estimated PASP of 59 mmHg. Based on increasing dyspnea over the last several months and and the pulmonary hypertension suggested by her echocardiogram overnight oximetry was performed that confirmed desaturation. She also underwent immune testing that showed a weakly positive ANA (1:80), negative dsDNA, normal ACE level, negative scleroderma antibody, negative RF, negative Sjogren's antibodies. A high-resolution CT scan of the chest performed on 02/26/15 that I have personally reviewed. This shows mild anterior basilar interstitial changes without significant groundglass. There is some traction bronchiectasis and evidence of mild honeycomb change. There is a solitary 3 mm left upper lobe pulmonary nodule that needs to be followed in one year. She tells me that she was on nitrofurantoin from Spring into the fall 2016 for chronic UTI's. She has a family hx lung CA.  Her ONO showed desaturations.   ROV 04/26/15 -- follow-up for multifactorial dyspnea in the setting of interstitial changes on CT scan, obstructive lung disease on pulmonary function testing, nocturnal hypoxemia as documented on overnight oximetry. Since last time I treated her with prednisone 30 mg daily for 4 weeks for possible interstitial disease related to nitrofurantoin. She is unclear whether it has helped her in any way. A sleep study performed on 03/24/15 showed no  evidence of obstructive sleep apnea or central sleep apnea. She did have mild oxygen desaturation which was treated with 1 L/m effectively. She remains on pred 30, did not decrease to 7.5 yet. She has had HA, she is having more eye burning and eye burning. She she has had night sweats.    Review of Systems  Constitutional: Negative for fever and unexpected  weight change.  HENT: Positive for trouble swallowing. Negative for congestion, dental problem, ear pain, nosebleeds, postnasal drip, rhinorrhea, sinus pressure, sneezing and sore throat.   Eyes: Negative for redness and itching.  Respiratory: Positive for cough, shortness of breath and wheezing. Negative for chest tightness.   Cardiovascular: Negative for palpitations and leg swelling.  Gastrointestinal: Negative for nausea and vomiting.  Genitourinary: Negative for dysuria.  Musculoskeletal: Negative for joint swelling and arthralgias.  Skin: Negative for rash.  Neurological: Negative for headaches.  Hematological: Does not bruise/bleed easily.  Psychiatric/Behavioral: Negative for dysphoric mood. The patient is nervous/anxious.        Objective:   Physical Exam Filed Vitals:   04/26/15 1619  BP: 140/80  Pulse: 64  Height: 5\' 5"  (1.651 m)  Weight: 124 lb (56.246 kg)  SpO2: 93%   Gen: Pleasant, well-nourished, in no distress,  normal affect  ENT: No lesions,  mouth clear,  oropharynx clear, no postnasal drip  Neck: No JVD, no TMG, no carotid bruits  Lungs: No use of accessory muscles, clear without rales or rhonchi  Cardiovascular: RRR, heart sounds normal, no murmur or gallops, no peripheral edema  Musculoskeletal: No deformities, no cyanosis or clubbing  Neuro: alert, non focal  Skin: Warm, no lesions or rashes      Assessment & Plan:  Interstitial lung disease (HCC) No crackles on exam today. Unclear to me whether treatment with prednisone has clinically changed her status. We will plan to repeat her CT scan of the chest in December 2017 or sooner if she clinically changes. I will go back to prednisone 7.5 mg daily, her maintenance dose.  Obstructive lung disease ([place]) I like to do a trial of Spiriva to see if she will benefit  Nocturnal hypoxemia Her sleep study did not show any evidence of sleep apnea. She does have nocturnal hypoxemia which is adequately  treated with 1 L/m. We will decrease her dose to 1 L

## 2015-04-26 NOTE — Patient Instructions (Addendum)
Please go back to 7.5mg  prednisone daily You may decrease your oxygen to 1L/min at night.  We will try starting spiriva 2 puffs once a day  We will repeat your CT scan of the chest in December 2017.  Follow with Dr Lamonte Sakai in 6 months or sooner if you have any problems.

## 2015-04-26 NOTE — Addendum Note (Signed)
Addended by: Desmond Dike C on: 04/26/2015 05:02 PM   Modules accepted: Medications

## 2015-04-26 NOTE — Assessment & Plan Note (Signed)
I like to do a trial of Spiriva to see if she will benefit

## 2015-04-26 NOTE — Assessment & Plan Note (Signed)
No crackles on exam today. Unclear to me whether treatment with prednisone has clinically changed her status. We will plan to repeat her CT scan of the chest in December 2017 or sooner if she clinically changes. I will go back to prednisone 7.5 mg daily, her maintenance dose.

## 2015-04-27 ENCOUNTER — Encounter: Payer: Self-pay | Admitting: Gastroenterology

## 2015-04-28 DIAGNOSIS — F330 Major depressive disorder, recurrent, mild: Secondary | ICD-10-CM | POA: Diagnosis not present

## 2015-05-01 ENCOUNTER — Encounter: Payer: Self-pay | Admitting: Emergency Medicine

## 2015-05-07 ENCOUNTER — Ambulatory Visit: Payer: Medicare Other | Admitting: Gastroenterology

## 2015-05-10 NOTE — Telephone Encounter (Signed)
Called and spoke with the pt's daughter and offered appt  She states pt needs to be seen but to call her to schedule  I called and spoke with the pt  She states that she is having increased DOE and chest discomfort  I have scheduled her to see TP tomorrow at 3:30 pm  She was urged to seek emergent care sooner if needed

## 2015-05-11 ENCOUNTER — Ambulatory Visit (INDEPENDENT_AMBULATORY_CARE_PROVIDER_SITE_OTHER)
Admission: RE | Admit: 2015-05-11 | Discharge: 2015-05-11 | Disposition: A | Discharge status: Home / Self Care | Payer: Medicare Other | Source: Ambulatory Visit | Attending: Adult Health | Admitting: Adult Health

## 2015-05-11 ENCOUNTER — Ambulatory Visit (INDEPENDENT_AMBULATORY_CARE_PROVIDER_SITE_OTHER): Payer: Medicare Other | Admitting: Adult Health

## 2015-05-11 ENCOUNTER — Encounter: Payer: Self-pay | Admitting: Adult Health

## 2015-05-11 VITALS — BP 106/64 | HR 60 | Temp 97.5°F | Ht 65.0 in | Wt 120.0 lb

## 2015-05-11 DIAGNOSIS — R05 Cough: Secondary | ICD-10-CM | POA: Diagnosis not present

## 2015-05-11 DIAGNOSIS — J441 Chronic obstructive pulmonary disease with (acute) exacerbation: Secondary | ICD-10-CM

## 2015-05-11 DIAGNOSIS — R059 Cough, unspecified: Secondary | ICD-10-CM

## 2015-05-11 DIAGNOSIS — R0602 Shortness of breath: Secondary | ICD-10-CM | POA: Diagnosis not present

## 2015-05-11 MED ORDER — DOXYCYCLINE HYCLATE 100 MG PO TABS: 100.0000 mg | ORAL_TABLET | Freq: Two times a day (BID) | ORAL | Status: DC

## 2015-05-11 NOTE — Patient Instructions (Addendum)
Doxycycline 100mg  Twice daily  For 7 days  Chest xray today  Mucinex DM Twice daily  As needed for cough and congesiton  Please contact office for sooner follow up if symptoms do not improve or worsen or seek emergency care  Follow up with Dr. Lamonte Sakai  3 months and As needed

## 2015-05-11 NOTE — Progress Notes (Signed)
Subjective:    Patient ID: Morgan Cooper, female    DOB: 1945/07/23, 70 y.o.   MRN: [id number]   70  yo former tobacco ( 16 pk-yrs) , hx of HTN + diastolic CHF, s/p renal tx on imuran and pred 7.5 qd, GERD / Barrett's / esophageal stricture. She has been having SOB for several years, but certainly worse for the last few months. She has occasional CP both at rest and w exertion. Dr Irish Lack performed a low risk Lexiscan in 5/'15. Occasionally some wheeze. No real cough or sputum. She is on a higher dose metoprolol, starting 1 month ago. She has been tried on BD's in the distant past, not recently.   ROV 04/09/14 -- follow-up visit for dyspnea. She has mixed obstruction and restriction on her function testing so we tried albuterol when necessary to see if she would benefit. She used it about 10 times, didn't notice any real changes in her breathing. She is having back pain and leg pain that play a role.   02/24/15 with [name] --  70 yo former tobacco, hx of HTN + diastolic CHF, s/p renal tx on imuran and pred 7.5 qd, GERD / Barrett's / esophageal stricture. She has been having SOB for several years, but certainly worse for the last few months. She has occasional CP both at rest and w exertion. No real cough or sputum. She has been tried on BD's 11/2014 which she said made no difference to her dyspnea, and she has stopped using them. She has been referred to [place] by [name], NP cardiology as the patient has not been seen by pulmonary in over 1 year, and she has worsening dyspnea, in addition to elevated PAP on recent Echo  03/18/15 -- follow-up visit for history of dyspnea that is multifactorial in the setting of obstructive disease, restrictive disease, diastolic CHF. She also has a history of renal transplant on Imuran and prednisone. She underwent cardiac stress testing on 02/03/15 that was reassuring. An echocardiogram from 12/13/216 showed normal systolic function, grade 2 diastolic dysfunction,  normal right ventricular size and function with an estimated PASP of 59 mmHg. Based on increasing dyspnea over the last several months and and the pulmonary hypertension suggested by her echocardiogram overnight oximetry was performed that confirmed desaturation. She also underwent immune testing that showed a weakly positive ANA (1:80), negative dsDNA, normal ACE level, negative scleroderma antibody, negative RF, negative Sjogren's antibodies. A high-resolution CT scan of the chest performed on 02/26/15 that I have personally reviewed. This shows mild anterior basilar interstitial changes without significant groundglass. There is some traction bronchiectasis and evidence of mild honeycomb change. There is a solitary 3 mm left upper lobe pulmonary nodule that needs to be followed in one year. She tells me that she was on nitrofurantoin from Spring into the fall 2016 for chronic UTI's. She has a family hx lung CA.  Her ONO showed desaturations.   ROV 04/26/15 -- follow-up for multifactorial dyspnea in the setting of interstitial changes on CT scan, obstructive lung disease on pulmonary function testing, nocturnal hypoxemia as documented on overnight oximetry. Since last time I treated her with prednisone 30 mg daily for 4 weeks for possible interstitial disease related to nitrofurantoin. She is unclear whether it has helped her in any way. A sleep study performed on 03/24/15 showed no evidence of obstructive sleep apnea or central sleep apnea. She did have mild oxygen desaturation which was treated with 1 L/m effectively. She remains  on pred 30, did not decrease to 7.5 yet. She has had HA, she is having more eye burning and eye burning. She she has had night sweats.   05/11/2015 Acute OV : Interstitial changes on CT scan , obstructive lung dz, nocturnal O2 Hx of renal transplant on imuran.   Pt presents for an acute office visit.  Complains of fatigue, chest congestion/tightness, Low grade fever of 99.1, chills, SOB  with activity, nausea, weakness starting on 05/08/15.  Denies any sinus drainage/pressure, fever or vomiting.  No chest pain, orthopnea, edema or hemoptysis.   Review of Systems  Constitutional:   No  weight loss, night sweats,  Fevers, chills, fatigue, or  lassitude.  HEENT:   No headaches,  Difficulty swallowing,  Tooth/dental problems, or  Sore throat,                No sneezing, itching, ear ache,  +nasal congestion, post nasal drip,   CV:  No chest pain,  Orthopnea, PND, swelling in lower extremities, anasarca, dizziness, palpitations, syncope.   GI  No heartburn, indigestion, abdominal pain, nausea, vomiting, diarrhea, change in bowel habits, loss of appetite, bloody stools.   Resp:    No chest wall deformity  Skin: no rash or lesions.  GU: no dysuria, change in color of urine, no urgency or frequency.  No flank pain, no hematuria   MS:  No joint pain or swelling.  No decreased range of motion.  No back pain.  Psych:  No change in mood or affect. No depression or anxiety.  No memory loss.          Objective:   Physical Exam Filed Vitals:   05/11/15 1536  BP: 106/64  Pulse: 60  Temp: 97.5 F (36.4 C)  TempSrc: Oral  Height: 5\' 5"  (1.651 m)  Weight: 120 lb (54.432 kg)  SpO2: 97%     Gen: Pleasant, in no distress,  normal affect  ENT: No lesions,  mouth clear,  oropharynx clear, no postnasal drip  Neck: No JVD, no TMG, no carotid bruits  Lungs: No use of accessory muscles, clear without rales or rhonchi  Cardiovascular: RRR, heart sounds normal, no murmur or gallops, no peripheral edema  Musculoskeletal: No deformities, no cyanosis or clubbing  Neuro: alert, non focal  Skin: Warm, no lesions or rashes  Sylus Stgermain NP-C  [facility]  05/11/15      Assessment & Plan:

## 2015-05-12 ENCOUNTER — Encounter (INDEPENDENT_AMBULATORY_CARE_PROVIDER_SITE_OTHER): Payer: Medicare Other | Admitting: Ophthalmology

## 2015-05-12 ENCOUNTER — Telehealth: Payer: Self-pay | Admitting: Adult Health

## 2015-05-12 DIAGNOSIS — H43813 Vitreous degeneration, bilateral: Secondary | ICD-10-CM | POA: Diagnosis not present

## 2015-05-12 DIAGNOSIS — H35033 Hypertensive retinopathy, bilateral: Secondary | ICD-10-CM

## 2015-05-12 DIAGNOSIS — H353221 Exudative age-related macular degeneration, left eye, with active choroidal neovascularization: Secondary | ICD-10-CM | POA: Diagnosis not present

## 2015-05-12 DIAGNOSIS — I10 Essential (primary) hypertension: Secondary | ICD-10-CM

## 2015-05-12 DIAGNOSIS — H353114 Nonexudative age-related macular degeneration, right eye, advanced atrophic with subfoveal involvement: Secondary | ICD-10-CM | POA: Diagnosis not present

## 2015-05-12 NOTE — Telephone Encounter (Signed)
I spoke with patient about results and she verbalized understanding and had no questions 

## 2015-05-12 NOTE — Progress Notes (Signed)
Quick Note:  LVM for pt to return call ______ 

## 2015-05-12 NOTE — Telephone Encounter (Signed)
Result Note     Chronic changes with no sign of PNA     Cont w/ ov recs    Please contact office for sooner follow up if symptoms do not improve or worsen or seek emergency care   ---  [email or web address] x1

## 2015-05-13 DIAGNOSIS — I10 Essential (primary) hypertension: Secondary | ICD-10-CM | POA: Diagnosis not present

## 2015-05-13 DIAGNOSIS — D631 Anemia in chronic kidney disease: Secondary | ICD-10-CM | POA: Diagnosis not present

## 2015-05-13 DIAGNOSIS — N2581 Secondary hyperparathyroidism of renal origin: Secondary | ICD-10-CM | POA: Diagnosis not present

## 2015-05-13 DIAGNOSIS — N185 Chronic kidney disease, stage 5: Secondary | ICD-10-CM | POA: Diagnosis not present

## 2015-05-13 DIAGNOSIS — E785 Hyperlipidemia, unspecified: Secondary | ICD-10-CM | POA: Diagnosis not present

## 2015-05-17 ENCOUNTER — Encounter: Payer: Self-pay | Admitting: Emergency Medicine

## 2015-05-18 NOTE — Telephone Encounter (Signed)
Dr Lamonte Sakai please advise on patient email. Some questions regarding O2 and a few concerns listed. Thanks.

## 2015-05-20 ENCOUNTER — Other Ambulatory Visit (HOSPITAL_COMMUNITY): Payer: Self-pay

## 2015-05-20 NOTE — Telephone Encounter (Signed)
3.30 mychart message from pt's daughter Morgan Cooper: Message      Any luck getting message to Dr. Alfonse Flavors?   Per [id number] email, Morgan Cooper is wondering about the plan moving forward, dryness and irritation in pt's nose, and communicating with DME about increasing pt's O2 and adding humidification.  Dr Lamonte Sakai please advise, thank you.  Non-Urgent Medical Question     From   Morgan Cooper    To   [name], MD    Sent   05/17/2015 7:54 PM       Mom saw a NP at your office last week for increased fatigue and activity intolerance but no notes have been entered into the Patient portal. She ordered a CXR and an antibiotic. CXR looks unchanged. Not sure what the plan is. We also called about her oxygen and, increased dryness and irritation of her nares but that was not addressed. The oxygen company said they have reached out to your office twice about increasing the oxygen and humidifying it but have had no response. However, she did have labs per Dr.Webb, nephrology, and her Iron sat, hgb and hct are all low. She is going in for [facility] Friday. That may explain the fatigue and activity intolerance. Please feel free to call me at [phone].  Thanks,  Morgan Cooper

## 2015-05-21 ENCOUNTER — Ambulatory Visit (HOSPITAL_COMMUNITY)
Admission: RE | Admit: 2015-05-21 | Discharge: 2015-05-21 | Disposition: A | Discharge status: Home / Self Care | Payer: Medicare Other | Source: Ambulatory Visit | Attending: Nephrology | Admitting: Nephrology

## 2015-05-21 DIAGNOSIS — D509 Iron deficiency anemia, unspecified: Secondary | ICD-10-CM | POA: Insufficient documentation

## 2015-05-21 MED ORDER — SODIUM CHLORIDE 0.9 % IV SOLN
510.0000 mg | INTRAVENOUS | Status: DC
  Administered 2015-05-21: 510 mg via INTRAVENOUS
  Filled 2015-05-21: qty 17

## 2015-05-21 NOTE — Discharge Instructions (Signed)

## 2015-05-24 NOTE — Assessment & Plan Note (Signed)
Flare with bronchitis  Check cxr   Plan  Doxycycline 100mg  Twice daily  For 7 days  Chest xray today  Mucinex DM Twice daily  As needed for cough and congesiton  Please contact office for sooner follow up if symptoms do not improve or worsen or seek emergency care  Follow up with Dr. Lamonte Sakai  3 months and As needed

## 2015-05-25 DIAGNOSIS — N3020 Other chronic cystitis without hematuria: Secondary | ICD-10-CM | POA: Diagnosis not present

## 2015-05-25 DIAGNOSIS — Z0000 Encounter for general adult medical examination without abnormal findings: Secondary | ICD-10-CM | POA: Diagnosis not present

## 2015-05-27 ENCOUNTER — Other Ambulatory Visit (HOSPITAL_COMMUNITY): Payer: Self-pay | Admitting: *Deleted

## 2015-05-28 ENCOUNTER — Encounter (HOSPITAL_COMMUNITY)
Admission: RE | Admit: 2015-05-28 | Discharge: 2015-05-28 | Disposition: A | Discharge status: Home / Self Care | Payer: Medicare Other | Source: Ambulatory Visit | Attending: Nephrology | Admitting: Nephrology

## 2015-05-28 DIAGNOSIS — D509 Iron deficiency anemia, unspecified: Secondary | ICD-10-CM | POA: Insufficient documentation

## 2015-05-28 MED ORDER — SODIUM CHLORIDE 0.9 % IV SOLN
510.0000 mg | INTRAVENOUS | Status: DC
  Administered 2015-05-28: 510 mg via INTRAVENOUS
  Filled 2015-05-28: qty 17

## 2015-06-01 ENCOUNTER — Encounter: Payer: Self-pay | Admitting: Emergency Medicine

## 2015-06-02 ENCOUNTER — Encounter: Payer: Self-pay | Admitting: Gastroenterology

## 2015-06-02 ENCOUNTER — Ambulatory Visit (INDEPENDENT_AMBULATORY_CARE_PROVIDER_SITE_OTHER): Payer: Medicare Other | Admitting: Gastroenterology

## 2015-06-02 ENCOUNTER — Other Ambulatory Visit (INDEPENDENT_AMBULATORY_CARE_PROVIDER_SITE_OTHER): Payer: Medicare Other

## 2015-06-02 VITALS — BP 126/70 | HR 60 | Ht 64.25 in | Wt 121.4 lb

## 2015-06-02 DIAGNOSIS — D649 Anemia, unspecified: Secondary | ICD-10-CM

## 2015-06-02 DIAGNOSIS — K219 Gastro-esophageal reflux disease without esophagitis: Secondary | ICD-10-CM

## 2015-06-02 DIAGNOSIS — K2270 Barrett's esophagus without dysplasia: Secondary | ICD-10-CM | POA: Diagnosis not present

## 2015-06-02 LAB — CBC WITH DIFFERENTIAL/PLATELET
BASOS ABS: 0 10*3/uL (ref 0.0–0.1)
BASOS PCT: 0.5 % (ref 0.0–3.0)
EOS ABS: 0.3 10*3/uL (ref 0.0–0.7)
Eosinophils Relative: 4 % (ref 0.0–5.0)
HEMATOCRIT: 36.3 % (ref 36.0–46.0)
Hemoglobin: 12 g/dL (ref 12.0–15.0)
LYMPHS PCT: 29.7 % (ref 12.0–46.0)
Lymphs Abs: 2.3 10*3/uL (ref 0.7–4.0)
MCHC: 33.2 g/dL (ref 30.0–36.0)
MCV: 84.7 fl (ref 78.0–100.0)
MONO ABS: 1.1 10*3/uL — AB (ref 0.1–1.0)
Monocytes Relative: 13.5 % — ABNORMAL HIGH (ref 3.0–12.0)
NEUTROS ABS: 4.1 10*3/uL (ref 1.4–7.7)
Neutrophils Relative %: 52.3 % (ref 43.0–77.0)
PLATELETS: 263 10*3/uL (ref 150.0–400.0)
RBC: 4.28 Mil/uL (ref 3.87–5.11)
RDW: 20 % — AB (ref 11.5–15.5)
WBC: 7.8 10*3/uL (ref 4.0–10.5)

## 2015-06-02 LAB — VITAMIN B12: Vitamin B-12: 252 pg/mL (ref 211–911)

## 2015-06-02 LAB — IBC PANEL
IRON: 121 ug/dL (ref 42–145)
SATURATION RATIOS: 38.6 % (ref 20.0–50.0)
TRANSFERRIN: 224 mg/dL (ref 212.0–360.0)

## 2015-06-02 LAB — FOLATE: FOLATE: 10.6 ng/mL (ref >5.9)

## 2015-06-02 LAB — FERRITIN: Ferritin: 812.1 ng/mL — ABNORMAL HIGH (ref 10.0–291.0)

## 2015-06-02 MED ORDER — ONDANSETRON HCL 4 MG PO TABS: 4.0000 mg | ORAL_TABLET | Freq: Three times a day (TID) | ORAL | Status: DC | PRN

## 2015-06-02 NOTE — Telephone Encounter (Signed)
RB please advise. Thanks.  

## 2015-06-02 NOTE — Progress Notes (Addendum)
Morgan Cooper    [id number]    04/09/1945  Primary Care Physician:Shamleffer, Herschell Dimes, MD  Referring Physician: Lottie Dawson, MD [place]  [place], [place] [address]  Chief complaint:  Anemia HPI: 70 year old female status post renal transplant on chronic immunosuppression and prednisone, GERD, Barrett's disease here for follow-up visit. Patient was noted to be slightly anemic on her labs and is here for further evaluation. Denies any nausea, vomiting, abdominal pain, melena or bright red blood per rectum No change in bowel habits. Her last EGD was done in 2017. She has evidence of atrophic gastritis and small segment of Barrett's esophagus.  She had perforated diverticulitis and is status subtotal colectomy and last colonoscopy was in 2015   Outpatient Encounter Prescriptions as of 06/02/2015  Medication Sig  . atorvastatin (LIPITOR) 20 MG tablet Take 20 mg by mouth daily.   Marland Kitchen azaTHIOprine (IMURAN) 50 MG tablet Take 50 mg by mouth every morning.   . cycloSPORINE (RESTASIS) 0.05 % ophthalmic emulsion Place 1 drop into both eyes 2 (two) times daily.    Marland Kitchen FLUoxetine (PROZAC) 20 MG capsule Take 20 mg by mouth every morning.   . furosemide (LASIX) 20 MG tablet TAKE ONE OR TWO TABLETS DAILY  . HYDROcodone-acetaminophen (NORCO) 10-325 MG per tablet Take 1 tablet by mouth every 6 (six) hours as needed. For pain  . metoprolol (LOPRESSOR) 50 MG tablet TAKE ONE TABLET TWICE DAILY  . omeprazole (PRILOSEC) 20 MG capsule Take 20 mg by mouth 2 (two) times daily.   . OXYGEN Inhale 1 L/min into the lungs at bedtime. And with activity if needed  . predniSONE (DELTASONE) 5 MG tablet Take 7.5 mg daily  . PRESCRIPTION MEDICATION Injection in left eye monthly for macular degeneration , done at [facility] , Dr Zigmund Daniel  . QUEtiapine (SEROQUEL) 200 MG tablet Take 1 tablet by mouth daily.  . Tiotropium Bromide Monohydrate (SPIRIVA RESPIMAT) 2.5 MCG/ACT  AERS Inhale 2 puffs into the lungs daily.  . ondansetron (ZOFRAN) 4 MG tablet Take 1 tablet (4 mg total) by mouth every 8 (eight) hours as needed for nausea or vomiting.  . [DISCONTINUED] doxycycline (VIBRA-TABS) 100 MG tablet Take 1 tablet (100 mg total) by mouth 2 (two) times daily. (Patient not taking: Reported on 06/02/2015)   Facility-Administered Encounter Medications as of 06/02/2015  Medication  . ferumoxytol (FERAHEME) 510 mg in sodium chloride 0.9 % 100 mL IVPB    Allergies as of 06/02/2015 - Review Complete 06/02/2015  Allergen Reaction Noted  . Amoxicillin Diarrhea 05/20/2014    Past Medical History  Diagnosis Date  . Renal failure   . Diverticulitis of large intestine with perforation Hx colectomy  . Hypertension   . Barrett's esophagus   . Hyperlipemia   . Esophageal stricture   . CVA (cerebral infarction)     Mini Strokes   . Pancreatic cyst   . Gastroparesis   . Depression   . Heart failure, diastolic, chronic ([place])   . Persistent headaches   . Rapid heart rate     some times  . GERD (gastroesophageal reflux disease)   . Forgetfulness   . Irritable   . Sleep difficulties   . Bone pain   . Osteoporosis   . Fatigue   . Disturbed concentration   . Itch     skin  . Nausea     little vomitting  . TIA (transient ischemic attack)   .  Macular degeneration   . Keratosis   . Anxiety   . Panic attacks   . Bruises easily   . Chills   . Generalized headaches   . Stroke ([place])   . Hx of cardiovascular stress test     Lexiscan Myoview (06/2013):  No ischemia, EF 72%; Low Risk  . CHF (congestive heart failure) ([place])   . Pulmonary hypertension [facility])     Past Surgical History  Procedure Laterality Date  . Kidney transplant  1984  . Appendectomy    . Colon resection  2010    Colostomy  . Colostomy  2011    Reversal   . Colostomy reversal    . Left arm surgery      due to injury  . Cataract surgery bilateral    . Esophageal dilation    .  Parathyroidectomy  11/04/2010    left inferior parathyroid    Family History  Problem Relation Age of Onset  . Lung cancer Sister   . Cancer    . Heart disease Mother   . Stroke Father   . Aneurysm Brother   . Colon cancer Maternal Aunt 75  . Esophageal cancer Neg Hx   . Rectal cancer Neg Hx   . Stomach cancer Neg Hx     Social History   Social History  . Marital Status: Widowed    Spouse Name: N/A  . Number of Children: 2  . Years of Education: 12   Occupational History  . Retired    Social History Main Topics  . Smoking status: Former Smoker -- 1.00 packs/day for 5 years    Types: Cigarettes    Quit date: 05/30/1989  . Smokeless tobacco: Never Used  . Alcohol Use: No  . Drug Use: No  . Sexual Activity: Not on file   Other Topics Concern  . Not on file   Social History Narrative   Patient is widowed with 2 children   Patient is right handed   Patient has a high school education   Patient drinks 3 cups daily            Review of systems: Review of Systems  Constitutional: Negative for fever and chills.  HENT: Negative.   Eyes: Negative for blurred vision.  Respiratory: Negative for cough, shortness of breath and wheezing.   Cardiovascular: Negative for chest pain and palpitations.  Gastrointestinal: as per HPI Genitourinary: Negative for dysuria, urgency, frequency and hematuria.  Musculoskeletal: Negative for myalgias, back pain and joint pain.  Skin: Negative for itching and rash.  Neurological: Negative for dizziness, tremors, focal weakness, seizures and loss of consciousness.  Endo/Heme/Allergies: Negative for environmental allergies.  Psychiatric/Behavioral: Negative for depression, suicidal ideas and hallucinations.  All other systems reviewed and are negative.   Physical Exam: Filed Vitals:   06/02/15 0845  BP: 126/70  Pulse: 60   Gen:      No acute distress HEENT:  EOMI, sclera anicteric Neck:     No masses; no thyromegaly Lungs:     Clear to auscultation bilaterally; normal respiratory effort CV:         Regular rate and rhythm; no murmurs Abd:      + bowel sounds; soft, non-tender; no palpable masses, no distension Ext:    No edema; adequate peripheral perfusion Skin:      Warm and dry; no rash Neuro: alert and oriented x 3 Psych: normal mood and affect  Data Reviewed: EGD 03/2015 Esophageal mucosa appeared normal.  Irregular Z-line with a very short segment of Barrett's mucosa  1-2cm, biopsies were obtained. Moderate gastritis diffusely through out the gastric mucosa, random biopsies were obtained. Duodenum appeared normal  Colonoscopy 05/2013 Normal   Assessment and Plan/Recommendations:  69 year old female status post renal transplant on chronic immunosuppression and prednisone, Barrett's esophagus, chronic GERD here for follow-up visit and evaluation of anemia We'll recheck CBC and iron studies and  check stool cards for Hemoccult Anemia could likely be multifactorial Will hold off repeat endoscopic evaluation or small bowel video capsule if has no evidence of GI blood loss as she is up-to-date for colorectal cancer screening and surveillance of Barrett's esophagus  Continue PPI and antireflux measures Return in 3 months  K. Denzil Magnuson , MD [phone] Mon-Fri 8a-5p [phone] after 5p, weekends, holidays

## 2015-06-02 NOTE — Patient Instructions (Signed)
Go to the basement today for labs We have sent Zofran to your pharmacy Your follow up appointment with Dr Silverio Decamp is 08/09/2015 at 10:30am

## 2015-06-03 NOTE — Telephone Encounter (Signed)
FYI-They have changed her antibiotic to Methenamine.   Forwarding update to RB.  RB please advise on recs.  Thanks.

## 2015-06-09 ENCOUNTER — Other Ambulatory Visit (INDEPENDENT_AMBULATORY_CARE_PROVIDER_SITE_OTHER): Payer: Medicare Other

## 2015-06-09 DIAGNOSIS — D649 Anemia, unspecified: Secondary | ICD-10-CM | POA: Diagnosis not present

## 2015-06-09 LAB — FECAL OCCULT BLOOD, IMMUNOCHEMICAL: FECAL OCCULT BLD: NEGATIVE

## 2015-06-16 ENCOUNTER — Other Ambulatory Visit: Payer: Self-pay | Admitting: Interventional Cardiology

## 2015-06-23 ENCOUNTER — Encounter (INDEPENDENT_AMBULATORY_CARE_PROVIDER_SITE_OTHER): Payer: Medicare Other | Admitting: Ophthalmology

## 2015-06-23 DIAGNOSIS — H353221 Exudative age-related macular degeneration, left eye, with active choroidal neovascularization: Secondary | ICD-10-CM | POA: Diagnosis not present

## 2015-06-23 DIAGNOSIS — H43813 Vitreous degeneration, bilateral: Secondary | ICD-10-CM | POA: Diagnosis not present

## 2015-06-23 DIAGNOSIS — H353112 Nonexudative age-related macular degeneration, right eye, intermediate dry stage: Secondary | ICD-10-CM | POA: Diagnosis not present

## 2015-06-23 DIAGNOSIS — I10 Essential (primary) hypertension: Secondary | ICD-10-CM | POA: Diagnosis not present

## 2015-06-23 DIAGNOSIS — H35033 Hypertensive retinopathy, bilateral: Secondary | ICD-10-CM | POA: Diagnosis not present

## 2015-06-24 ENCOUNTER — Other Ambulatory Visit: Payer: Self-pay | Admitting: Interventional Cardiology

## 2015-07-05 DIAGNOSIS — Z85828 Personal history of other malignant neoplasm of skin: Secondary | ICD-10-CM | POA: Diagnosis not present

## 2015-07-05 DIAGNOSIS — L309 Dermatitis, unspecified: Secondary | ICD-10-CM | POA: Diagnosis not present

## 2015-07-05 DIAGNOSIS — L570 Actinic keratosis: Secondary | ICD-10-CM | POA: Diagnosis not present

## 2015-07-21 DIAGNOSIS — F330 Major depressive disorder, recurrent, mild: Secondary | ICD-10-CM | POA: Diagnosis not present

## 2015-07-27 DIAGNOSIS — G8929 Other chronic pain: Secondary | ICD-10-CM | POA: Diagnosis not present

## 2015-07-27 DIAGNOSIS — G894 Chronic pain syndrome: Secondary | ICD-10-CM | POA: Diagnosis not present

## 2015-07-27 DIAGNOSIS — Z79891 Long term (current) use of opiate analgesic: Secondary | ICD-10-CM | POA: Diagnosis not present

## 2015-07-27 DIAGNOSIS — M5442 Lumbago with sciatica, left side: Secondary | ICD-10-CM | POA: Diagnosis not present

## 2015-08-09 ENCOUNTER — Encounter: Payer: Self-pay | Admitting: Gastroenterology

## 2015-08-09 ENCOUNTER — Ambulatory Visit (INDEPENDENT_AMBULATORY_CARE_PROVIDER_SITE_OTHER): Payer: Medicare Other | Admitting: Gastroenterology

## 2015-08-09 ENCOUNTER — Other Ambulatory Visit (INDEPENDENT_AMBULATORY_CARE_PROVIDER_SITE_OTHER): Payer: Medicare Other

## 2015-08-09 VITALS — BP 102/60 | HR 60 | Ht 64.25 in | Wt 120.5 lb

## 2015-08-09 DIAGNOSIS — K862 Cyst of pancreas: Secondary | ICD-10-CM

## 2015-08-09 DIAGNOSIS — K2270 Barrett's esophagus without dysplasia: Secondary | ICD-10-CM | POA: Diagnosis not present

## 2015-08-09 DIAGNOSIS — K219 Gastro-esophageal reflux disease without esophagitis: Secondary | ICD-10-CM | POA: Diagnosis not present

## 2015-08-09 DIAGNOSIS — D649 Anemia, unspecified: Secondary | ICD-10-CM

## 2015-08-09 LAB — CBC WITH DIFFERENTIAL/PLATELET
BASOS PCT: 0.3 % (ref 0.0–3.0)
Basophils Absolute: 0 10*3/uL (ref 0.0–0.1)
EOS ABS: 0.3 10*3/uL (ref 0.0–0.7)
EOS PCT: 2.9 % (ref 0.0–5.0)
HCT: 39 % (ref 36.0–46.0)
HEMOGLOBIN: 13.3 g/dL (ref 12.0–15.0)
LYMPHS ABS: 1.9 10*3/uL (ref 0.7–4.0)
Lymphocytes Relative: 20.1 % (ref 12.0–46.0)
MCHC: 34 g/dL (ref 30.0–36.0)
MCV: 90.6 fl (ref 78.0–100.0)
MONO ABS: 1.1 10*3/uL — AB (ref 0.1–1.0)
MONOS PCT: 11.5 % (ref 3.0–12.0)
NEUTROS ABS: 6.3 10*3/uL (ref 1.4–7.7)
NEUTROS PCT: 65.2 % (ref 43.0–77.0)
PLATELETS: 280 10*3/uL (ref 150.0–400.0)
RBC: 4.31 Mil/uL (ref 3.87–5.11)
RDW: 18.5 % — AB (ref 11.5–15.5)
WBC: 9.7 10*3/uL (ref 4.0–10.5)

## 2015-08-09 LAB — BASIC METABOLIC PANEL
BUN: 12 mg/dL (ref 6–23)
CALCIUM: 9.6 mg/dL (ref 8.4–10.5)
CO2: 26 mEq/L (ref 19–32)
Chloride: 104 mEq/L (ref 96–112)
Creatinine, Ser: 0.84 mg/dL (ref 0.40–1.20)
GFR: 71.03 mL/min (ref >60.00)
Glucose, Bld: 104 mg/dL — ABNORMAL HIGH (ref 70–99)
POTASSIUM: 4.5 meq/L (ref 3.5–5.1)
SODIUM: 138 meq/L (ref 135–145)

## 2015-08-09 MED ORDER — LORAZEPAM 1 MG PO TABS: 1.0000 mg | ORAL_TABLET | Freq: Three times a day (TID) | ORAL | Status: DC

## 2015-08-09 NOTE — Progress Notes (Signed)
Morgan Cooper    [id number]    1945/06/14  Primary Care Physician:Shamleffer, Herschell Dimes, MD  Referring Physician: Lottie Dawson, MD [place]  [place],  [id number]  Chief complaint:  Anemia follow up, Pancreatic cyst  HPI:  70 year old female status post renal transplant on chronic immunosuppression and prednisone, GERD, Barrett's disease here for follow-up visit. Patient was noted to be slightly anemic on her labs done at PMD office earlier this year and was sent here for further evaluation. Subsequent CBC checked in our office in April showed Hgb 12, and no evidence of iron deficiency.  Denies any nausea, vomiting, abdominal pain, melena or bright red blood per rectum. No change in bowel habits. Her last EGD was done in Feb 2017. She has evidence of atrophic gastritis and small segment of Barrett's esophagus. She had perforated diverticulitis and is status subtotal colectomy and last colonoscopy was in 2015 that was unremarkable. 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    Outpatient Encounter Prescriptions as of 08/09/2015  Medication Sig  . atorvastatin (LIPITOR) 20 MG tablet Take 20 mg by mouth daily.   Marland Kitchen azaTHIOprine (IMURAN) 50 MG tablet Take 50 mg by mouth every morning.   . cycloSPORINE (RESTASIS) 0.05 % ophthalmic emulsion Place 1 drop into both eyes 2 (two) times daily.    Marland Kitchen FLUoxetine (PROZAC) 20 MG capsule Take 20 mg by mouth every morning.   . furosemide (LASIX) 20 MG tablet TAKE ONE OR TWO TABLETS DAILY  . HYDROcodone-acetaminophen (NORCO) 10-325 MG per tablet Take 1 tablet by mouth every 6 (six) hours as needed. For pain  . metoprolol (LOPRESSOR) 50 MG tablet Take 1 tablet (50 mg total) by mouth 2 (two) times daily. Please call and schedule a six month follow up appointment with Dr Irish Lack per last office visit  . metoprolol (LOPRESSOR) 50 MG tablet  Take 1 tablet (50 mg total) by mouth 2 (two) times daily. Please call and schedule a 6 month appointment per last office visit  . omeprazole (PRILOSEC) 20 MG capsule Take 20 mg by mouth 2 (two) times daily.   . ondansetron (ZOFRAN) 4 MG tablet Take 1 tablet (4 mg total) by mouth every 8 (eight) hours as needed for nausea or vomiting.  . OXYGEN Inhale 1 L/min into the lungs at bedtime. And with activity if needed  . predniSONE (DELTASONE) 5 MG tablet Take 7.5 mg daily  . PRESCRIPTION MEDICATION Injection in left eye monthly for macular degeneration , done at [facility] , Dr Zigmund Daniel  . QUEtiapine (SEROQUEL) 200 MG tablet Take 1 tablet by mouth daily.  . Tiotropium Bromide Monohydrate (SPIRIVA RESPIMAT) 2.5 MCG/ACT AERS Inhale 2 puffs into the lungs daily.  Marland Kitchen LORazepam (ATIVAN) 1 MG tablet Take 1 tablet (1 mg total) by mouth every 8 (eight) hours.   No facility-administered encounter medications on file as of 08/09/2015.    Allergies as of 08/09/2015 - Review Complete 08/09/2015  Allergen Reaction Noted  . Amoxicillin Diarrhea 05/20/2014    Past Medical History  Diagnosis Date  . Renal failure   . Diverticulitis of large intestine with perforation Hx colectomy  . Hypertension   . Barrett's esophagus   . Hyperlipemia   . Esophageal stricture   . CVA (cerebral infarction)     Mini Strokes   . Pancreatic cyst   . Gastroparesis   .  Depression   . Heart failure, diastolic, chronic ([place])   . Persistent headaches   . Rapid heart rate     some times  . GERD (gastroesophageal reflux disease)   . Forgetfulness   . Irritable   . Sleep difficulties   . Bone pain   . Osteoporosis   . Fatigue   . Disturbed concentration   . Itch     skin  . Nausea     little vomitting  . TIA (transient ischemic attack)   . Macular degeneration   . Keratosis   . Anxiety   . Panic attacks   . Bruises easily   . Chills   . Generalized headaches   . Stroke ([place])   . Hx of cardiovascular  stress test     Lexiscan Myoview (06/2013):  No ischemia, EF 72%; Low Risk  . CHF (congestive heart failure) ([place])   . Pulmonary hypertension [facility])     Past Surgical History  Procedure Laterality Date  . Kidney transplant  1984  . Appendectomy    . Colon resection  2010    Colostomy  . Colostomy  2011    Reversal   . Colostomy reversal    . Left arm surgery      due to injury  . Cataract surgery bilateral    . Esophageal dilation    . Parathyroidectomy  11/04/2010    left inferior parathyroid    Family History  Problem Relation Age of Onset  . Lung cancer Sister   . Cancer    . Heart disease Mother   . Stroke Father   . Aneurysm Brother   . Colon cancer Maternal Aunt 88  . Esophageal cancer Neg Hx   . Rectal cancer Neg Hx   . Stomach cancer Neg Hx     Social History   Social History  . Marital Status: Widowed    Spouse Name: N/A  . Number of Children: 2  . Years of Education: 12   Occupational History  . Retired    Social History Main Topics  . Smoking status: Former Smoker -- 1.00 packs/day for 5 years    Types: Cigarettes    Quit date: 05/30/1989  . Smokeless tobacco: Never Used  . Alcohol Use: No  . Drug Use: No  . Sexual Activity: Not on file   Other Topics Concern  . Not on file   Social History Narrative   Patient is widowed with 2 children   Patient is right handed   Patient has a high school education   Patient drinks 3 cups daily            Review of systems: Review of Systems  Constitutional: Negative for fever and chills.  HENT: Negative.   Eyes: Negative for blurred vision.  Respiratory: Negative for cough, shortness of breath and wheezing.   Cardiovascular: Negative for chest pain and palpitations.  Gastrointestinal: as per HPI Genitourinary: Negative for dysuria, urgency, frequency and hematuria.  Musculoskeletal: Negative for myalgias, back pain and joint pain.  Skin: Negative for itching and rash.  Neurological: Negative  for dizziness, tremors, focal weakness, seizures and loss of consciousness.  Endo/Heme/Allergies: Negative for environmental allergies.  Psychiatric/Behavioral: Negative for depression, suicidal ideas and hallucinations.  All other systems reviewed and are negative.   Physical Exam: Filed Vitals:   08/09/15 1024  BP: 102/60  Pulse: 60   Gen:      No acute distress HEENT:  EOMI, sclera anicteric Neck:  No masses; no thyromegaly Lungs:    Clear to auscultation bilaterally; normal respiratory effort CV:         Regular rate and rhythm; no murmurs Abd:      + bowel sounds; soft, mild tender mid abdomen;  palpable transplant kidney, no distension Ext:    No edema; adequate peripheral perfusion Skin:      Warm and dry; no rash Neuro: alert and oriented x 3 Psych: normal mood and affect  Data Reviewed:  Reviewed chart in epic   Assessment and Plan/Recommendations: 21 yr F s/p renal transplant on chronic immunosuppression, chronic GERD, barrett's disease is here for follow up Patient did have any evidence of iron deficiency and stool heme occult was negative She is up to date for colorectal cancer screening and also Barrett's surveillance Will schedule for MRCP to follow up the complex pancreatic cyst in the tail; patient has claustrophobia, instructed her to take Ativan 1mg  x2 prior to the MRI Recheck CBC and if stable, no further work up Cont PPI and antireflux measures for GERD Return as needed  Damaris Hippo , MD [phone] Mon-Fri 8a-5p [phone] after 5p, weekends, holidays  CC: Shamleffer, Ibethal Jar*

## 2015-08-09 NOTE — Patient Instructions (Addendum)
You have been scheduled for an MRI/MRCP at [facility] on 08/16/2015 . Your appointment time is 9am . Please arrive 15 minutes prior to your appointment time for registration purposes. Please make certain not to have anything to eat or drink 6 hours prior to your test. In addition, if you have any metal in your body, have a pacemaker or defibrillator, please be sure to let your ordering physician know. This test typically takes 45 minutes to 1 hour to complete.  We are prescribing you 2 Ativan to take before your Ativan. One to take 1 hour before your test and the other Ativan you will take when the MRI Tech lets you know when to take it You will need a driver for this test    Go to the basement for labs today   Follow up as needed

## 2015-08-11 ENCOUNTER — Encounter: Payer: Self-pay | Admitting: Emergency Medicine

## 2015-08-11 ENCOUNTER — Ambulatory Visit (INDEPENDENT_AMBULATORY_CARE_PROVIDER_SITE_OTHER): Payer: Medicare Other | Admitting: Emergency Medicine

## 2015-08-11 VITALS — BP 138/70 | HR 56 | Ht 65.0 in | Wt 121.0 lb

## 2015-08-11 DIAGNOSIS — J849 Interstitial pulmonary disease, unspecified: Secondary | ICD-10-CM

## 2015-08-11 DIAGNOSIS — J441 Chronic obstructive pulmonary disease with (acute) exacerbation: Secondary | ICD-10-CM | POA: Diagnosis not present

## 2015-08-11 DIAGNOSIS — G4734 Idiopathic sleep related nonobstructive alveolar hypoventilation: Secondary | ICD-10-CM

## 2015-08-11 MED ORDER — AZITHROMYCIN 250 MG PO TABS: ORAL_TABLET | ORAL | Status: DC

## 2015-08-11 MED ORDER — PREDNISONE 10 MG PO TABS: ORAL_TABLET | ORAL | Status: DC

## 2015-08-11 NOTE — Progress Notes (Signed)
Subjective:    Patient ID: Morgan Cooper, female    DOB: [birth year], 70 y.o.   MRN: [id number]  Shortness of Breath Associated symptoms include wheezing. Pertinent negatives include no ear pain, fever, headaches, leg swelling, rash, rhinorrhea, sore throat or vomiting.   70  yo former tobacco ( 16 pk-yrs) , hx of HTN + diastolic CHF, s/p renal tx on imuran and pred 7.5 qd, GERD / Barrett's / esophageal stricture. She has been having SOB for several years, but certainly worse for the last few months. She has occasional CP both at rest and w exertion. Morgan Cooper performed a low risk Lexiscan in 5/'15. Occasionally some wheeze. No real cough or sputum. She is on a higher dose metoprolol, starting 1 month ago. She has been tried on BD's in the distant past, not recently.   ROV 04/09/14 -- follow-up visit for dyspnea. She has mixed obstruction and restriction on her function testing so we tried albuterol when necessary to see if she would benefit. She used it about 10 times, didn't notice any real changes in her breathing. She is having back pain and leg pain that play a role.   02/24/15 with Morgan Cooper --  70 yo former tobacco, hx of HTN + diastolic CHF, s/p renal tx on imuran and pred 7.5 qd, GERD / Barrett's / esophageal stricture. She has been having SOB for several years, but certainly worse for the last few months. She has occasional CP both at rest and w exertion. No real cough or sputum. She has been tried on BD's 11/2014 which she said made no difference to her dyspnea, and she has stopped using them. She has been referred to [place] by Morgan Merle, NP cardiology as the patient has not been seen by pulmonary in over 1 year, and she has worsening dyspnea, in addition to elevated PAP on recent Echo  03/18/15 -- follow-up visit for history of dyspnea that is multifactorial in the setting of obstructive disease, restrictive disease, diastolic CHF. She also has a history of renal transplant on Imuran and  prednisone. She underwent cardiac stress testing on 02/03/15 that was reassuring. An echocardiogram from 12/13/216 showed normal systolic function, grade 2 diastolic dysfunction, normal right ventricular size and function with an estimated PASP of 59 mmHg. Based on increasing dyspnea over the last several months and and the pulmonary hypertension suggested by her echocardiogram overnight oximetry was performed that confirmed desaturation. She also underwent immune testing that showed a weakly positive ANA (1:80), negative dsDNA, normal ACE level, negative scleroderma antibody, negative RF, negative Sjogren's antibodies. A high-resolution CT scan of the chest performed on 02/26/15 that I have personally reviewed. This shows mild anterior basilar interstitial changes without significant groundglass. There is some traction bronchiectasis and evidence of mild honeycomb change. There is a solitary 3 mm left upper lobe pulmonary nodule that needs to be followed in one year. She tells me that she was on nitrofurantoin from Spring into the fall 2016 for chronic UTI's. She has a family hx lung CA.  Her ONO showed desaturations.   ROV 04/26/15 -- follow-up for multifactorial dyspnea in the setting of interstitial changes on CT scan, obstructive lung disease on pulmonary function testing, nocturnal hypoxemia as documented on overnight oximetry. Since last time I treated her with prednisone 30 mg daily for 4 weeks for possible interstitial disease related to nitrofurantoin. She is unclear whether it has helped her in any way. A sleep study performed on 03/24/15 showed no  evidence of obstructive sleep apnea or central sleep apnea. She did have mild oxygen desaturation which was treated with 1 L/m effectively. She remains on pred 30, did not decrease to 7.5 yet. She has had HA, she is having more eye burning and eye burning. She she has had night sweats.   ROV 08/11/15 -- patient with a history of renal transplant, abnormal CT scan  characterized by interstitial changes, also with obstructive lung disease on poor function testing and nocturnal hypoxemia. 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 She has been well since last visit until about 2 weeks ago - developed URI sx, now with increased chest tightness, congestion, drainage, nausea. Remains on imuran and pred. She is back on methanamine qd for her recurrent UTI's. Stopped Spiriva a month ago - wasn't sure it was helping her.   Review of Systems  Constitutional: Negative for fever and unexpected weight change.  HENT: Positive for trouble swallowing. Negative for congestion, dental problem, ear pain, nosebleeds, postnasal drip, rhinorrhea, sinus pressure, sneezing and sore throat.   Eyes: Negative for redness and itching.  Respiratory: Positive for cough, shortness of breath and wheezing. Negative for chest tightness.   Cardiovascular: Negative for palpitations and leg swelling.  Gastrointestinal: Negative for nausea and vomiting.  Genitourinary: Negative for dysuria.  Musculoskeletal: Negative for joint swelling and arthralgias.  Skin: Negative for rash.  Neurological: Negative for headaches.  Hematological: Does not bruise/bleed easily.  Psychiatric/Behavioral: Negative for dysphoric mood. The patient is nervous/anxious.        Objective:   Physical Exam Filed Vitals:   08/11/15 1343  BP: 138/70  Pulse: 56  Height: 5\' 5"  (1.651 m)  Weight: 121 lb (54.885 kg)  SpO2: 98%   Gen: Pleasant, well-nourished, in no distress,  normal affect  ENT: No lesions,  mouth clear,  oropharynx clear, no postnasal drip  Neck: No JVD, no TMG, no carotid bruits  Lungs: No use of accessory muscles, clear without rales or rhonchi  Cardiovascular: RRR, heart sounds normal, no murmur or gallops, no peripheral edema  Musculoskeletal: No deformities, no cyanosis or clubbing  Neuro: alert, non focal  Skin:  Warm, no lesions or rashes      Assessment & Plan:  Interstitial lung disease ([place]) Repeat CT scan of the chest in December to compare with priors. She is currently on methenamine (off nitrofurantoin)  Nocturnal hypoxemia Continue oxygen at night  Obstructive lung disease ([place]) With apparent bronchitis following recent upper respiratory infection. I will treat her with a short course of increased prednisone and azithromycin. Restart Spiriva which she stopped about one month ago   Baltazar Apo, MD, PhD 08/11/2015, 2:16 PM [facility] [phone] or if no answer [phone]

## 2015-08-11 NOTE — Assessment & Plan Note (Signed)
Repeat CT scan of the chest in December to compare with priors. She is currently on methenamine (off nitrofurantoin)

## 2015-08-11 NOTE — Assessment & Plan Note (Signed)
Continue oxygen at night.   

## 2015-08-11 NOTE — Patient Instructions (Signed)
Please take prednisone 20 mg daily for 3 days, then 20 mg daily for 3 days then go back to your usual dose of 7.5 mg daily Take azithromycin as directed Please restart your Spiriva 2 sprays once a day. We will write a new prescription for this medication. We will repeat your CT scan of the chest, high-resolution cuts with no contrast, to evaluate interstitial disease in December 2017 Follow-up with Dr. Lamonte Sakai in December after scan to review the results.

## 2015-08-11 NOTE — Addendum Note (Signed)
Addended by: Osa Craver on: 08/11/2015 02:50 PM   Modules accepted: Orders

## 2015-08-11 NOTE — Assessment & Plan Note (Addendum)
With apparent bronchitis following recent upper respiratory infection. I will treat her with a short course of increased prednisone and azithromycin. Restart Spiriva which she stopped about one month ago

## 2015-08-12 ENCOUNTER — Telehealth: Payer: Self-pay | Admitting: Emergency Medicine

## 2015-08-12 MED ORDER — TIOTROPIUM BROMIDE MONOHYDRATE 2.5 MCG/ACT IN AERS: 2.0000 | INHALATION_SPRAY | Freq: Every day | RESPIRATORY_TRACT | Status: DC

## 2015-08-12 NOTE — Telephone Encounter (Signed)
Pt requesting Spiriva refill sent to [facility]. Rx sent. Nothing further needed.

## 2015-08-16 ENCOUNTER — Other Ambulatory Visit: Payer: Self-pay | Admitting: Gastroenterology

## 2015-08-16 ENCOUNTER — Ambulatory Visit (HOSPITAL_COMMUNITY)
Admission: RE | Admit: 2015-08-16 | Discharge: 2015-08-16 | Disposition: A | Discharge status: Home / Self Care | Payer: Medicare Other | Source: Ambulatory Visit | Attending: Gastroenterology | Admitting: Gastroenterology

## 2015-08-16 DIAGNOSIS — I700 Atherosclerosis of aorta: Secondary | ICD-10-CM | POA: Diagnosis not present

## 2015-08-16 DIAGNOSIS — K862 Cyst of pancreas: Secondary | ICD-10-CM | POA: Diagnosis not present

## 2015-08-16 DIAGNOSIS — K8689 Other specified diseases of pancreas: Secondary | ICD-10-CM | POA: Diagnosis not present

## 2015-08-16 DIAGNOSIS — R935 Abnormal findings on diagnostic imaging of other abdominal regions, including retroperitoneum: Secondary | ICD-10-CM | POA: Diagnosis not present

## 2015-08-16 DIAGNOSIS — I722 Aneurysm of renal artery: Secondary | ICD-10-CM | POA: Insufficient documentation

## 2015-08-16 MED ORDER — GADOBENATE DIMEGLUMINE 529 MG/ML IV SOLN
15.0000 mL | Freq: Once | INTRAVENOUS | Status: AC | PRN
  Administered 2015-08-16: 11 mL via INTRAVENOUS

## 2015-08-18 ENCOUNTER — Telehealth: Payer: Self-pay | Admitting: Gastroenterology

## 2015-08-18 ENCOUNTER — Encounter (INDEPENDENT_AMBULATORY_CARE_PROVIDER_SITE_OTHER): Payer: Medicare Other | Admitting: Ophthalmology

## 2015-08-18 DIAGNOSIS — H35033 Hypertensive retinopathy, bilateral: Secondary | ICD-10-CM

## 2015-08-18 DIAGNOSIS — H353114 Nonexudative age-related macular degeneration, right eye, advanced atrophic with subfoveal involvement: Secondary | ICD-10-CM | POA: Diagnosis not present

## 2015-08-18 DIAGNOSIS — H43813 Vitreous degeneration, bilateral: Secondary | ICD-10-CM

## 2015-08-18 DIAGNOSIS — H353221 Exudative age-related macular degeneration, left eye, with active choroidal neovascularization: Secondary | ICD-10-CM | POA: Diagnosis not present

## 2015-08-18 DIAGNOSIS — I10 Essential (primary) hypertension: Secondary | ICD-10-CM

## 2015-08-27 ENCOUNTER — Other Ambulatory Visit: Payer: Self-pay | Admitting: General Surgery

## 2015-08-27 DIAGNOSIS — D378 Neoplasm of uncertain behavior of other specified digestive organs: Secondary | ICD-10-CM | POA: Diagnosis not present

## 2015-08-30 DIAGNOSIS — N3020 Other chronic cystitis without hematuria: Secondary | ICD-10-CM | POA: Diagnosis not present

## 2015-09-29 DIAGNOSIS — H353221 Exudative age-related macular degeneration, left eye, with active choroidal neovascularization: Secondary | ICD-10-CM | POA: Diagnosis not present

## 2015-09-29 DIAGNOSIS — Z961 Presence of intraocular lens: Secondary | ICD-10-CM | POA: Diagnosis not present

## 2015-09-29 DIAGNOSIS — H35033 Hypertensive retinopathy, bilateral: Secondary | ICD-10-CM | POA: Diagnosis not present

## 2015-09-29 DIAGNOSIS — H353110 Nonexudative age-related macular degeneration, right eye, stage unspecified: Secondary | ICD-10-CM | POA: Diagnosis not present

## 2015-10-08 DIAGNOSIS — N3020 Other chronic cystitis without hematuria: Secondary | ICD-10-CM | POA: Diagnosis not present

## 2015-10-15 DIAGNOSIS — I509 Heart failure, unspecified: Secondary | ICD-10-CM | POA: Diagnosis not present

## 2015-10-15 DIAGNOSIS — D638 Anemia in other chronic diseases classified elsewhere: Secondary | ICD-10-CM | POA: Diagnosis not present

## 2015-10-15 DIAGNOSIS — N185 Chronic kidney disease, stage 5: Secondary | ICD-10-CM | POA: Diagnosis not present

## 2015-10-15 DIAGNOSIS — Z940 Kidney transplant status: Secondary | ICD-10-CM | POA: Diagnosis not present

## 2015-10-15 DIAGNOSIS — I272 Other secondary pulmonary hypertension: Secondary | ICD-10-CM | POA: Diagnosis not present

## 2015-10-15 DIAGNOSIS — I10 Essential (primary) hypertension: Secondary | ICD-10-CM | POA: Diagnosis not present

## 2015-10-18 DIAGNOSIS — F330 Major depressive disorder, recurrent, mild: Secondary | ICD-10-CM | POA: Diagnosis not present

## 2015-10-27 ENCOUNTER — Encounter (INDEPENDENT_AMBULATORY_CARE_PROVIDER_SITE_OTHER): Payer: Medicare Other | Admitting: Ophthalmology

## 2015-10-27 DIAGNOSIS — H353114 Nonexudative age-related macular degeneration, right eye, advanced atrophic with subfoveal involvement: Secondary | ICD-10-CM | POA: Diagnosis not present

## 2015-10-27 DIAGNOSIS — H43813 Vitreous degeneration, bilateral: Secondary | ICD-10-CM

## 2015-10-27 DIAGNOSIS — I10 Essential (primary) hypertension: Secondary | ICD-10-CM

## 2015-10-27 DIAGNOSIS — H35033 Hypertensive retinopathy, bilateral: Secondary | ICD-10-CM

## 2015-10-27 DIAGNOSIS — H353221 Exudative age-related macular degeneration, left eye, with active choroidal neovascularization: Secondary | ICD-10-CM

## 2015-11-01 ENCOUNTER — Other Ambulatory Visit: Payer: Self-pay | Admitting: Internal Medicine

## 2015-11-01 DIAGNOSIS — Z1231 Encounter for screening mammogram for malignant neoplasm of breast: Secondary | ICD-10-CM

## 2015-11-16 DIAGNOSIS — Z79891 Long term (current) use of opiate analgesic: Secondary | ICD-10-CM | POA: Diagnosis not present

## 2015-11-16 DIAGNOSIS — M5442 Lumbago with sciatica, left side: Secondary | ICD-10-CM | POA: Diagnosis not present

## 2015-11-16 DIAGNOSIS — G894 Chronic pain syndrome: Secondary | ICD-10-CM | POA: Diagnosis not present

## 2015-11-17 ENCOUNTER — Ambulatory Visit
Admission: RE | Admit: 2015-11-17 | Discharge: 2015-11-17 | Disposition: A | Discharge status: Home / Self Care | Payer: Medicare Other | Source: Ambulatory Visit | Attending: Internal Medicine | Admitting: Internal Medicine

## 2015-11-17 DIAGNOSIS — Z1231 Encounter for screening mammogram for malignant neoplasm of breast: Secondary | ICD-10-CM

## 2015-11-18 DIAGNOSIS — N13731 Vesicoureteral-reflux with reflux nephropathy with hydroureter, unilateral: Secondary | ICD-10-CM | POA: Diagnosis not present

## 2015-11-18 DIAGNOSIS — N1339 Other hydronephrosis: Secondary | ICD-10-CM | POA: Diagnosis not present

## 2015-11-29 DIAGNOSIS — N185 Chronic kidney disease, stage 5: Secondary | ICD-10-CM | POA: Diagnosis not present

## 2015-11-29 DIAGNOSIS — I10 Essential (primary) hypertension: Secondary | ICD-10-CM | POA: Diagnosis not present

## 2015-11-29 DIAGNOSIS — E039 Hypothyroidism, unspecified: Secondary | ICD-10-CM | POA: Diagnosis not present

## 2015-11-29 DIAGNOSIS — D631 Anemia in chronic kidney disease: Secondary | ICD-10-CM | POA: Diagnosis not present

## 2015-11-29 DIAGNOSIS — N2581 Secondary hyperparathyroidism of renal origin: Secondary | ICD-10-CM | POA: Diagnosis not present

## 2015-11-29 DIAGNOSIS — E785 Hyperlipidemia, unspecified: Secondary | ICD-10-CM | POA: Diagnosis not present

## 2015-11-30 ENCOUNTER — Encounter (HOSPITAL_COMMUNITY)
Admission: RE | Admit: 2015-11-30 | Discharge: 2015-11-30 | Disposition: A | Discharge status: Home / Self Care | Payer: Medicare Other | Source: Ambulatory Visit | Attending: General Surgery | Admitting: General Surgery

## 2015-11-30 ENCOUNTER — Encounter (HOSPITAL_COMMUNITY): Payer: Self-pay

## 2015-11-30 DIAGNOSIS — Z01818 Encounter for other preprocedural examination: Secondary | ICD-10-CM | POA: Insufficient documentation

## 2015-11-30 DIAGNOSIS — D378 Neoplasm of uncertain behavior of other specified digestive organs: Secondary | ICD-10-CM | POA: Diagnosis not present

## 2015-11-30 DIAGNOSIS — Z01812 Encounter for preprocedural laboratory examination: Secondary | ICD-10-CM | POA: Insufficient documentation

## 2015-11-30 HISTORY — DX: Malignant (primary) neoplasm, unspecified: C80.1

## 2015-11-30 HISTORY — DX: Personal history of other diseases of the digestive system: Z87.19

## 2015-11-30 HISTORY — DX: Sleep apnea, unspecified: G47.30

## 2015-11-30 HISTORY — DX: Unspecified osteoarthritis, unspecified site: M19.90

## 2015-11-30 LAB — COMPREHENSIVE METABOLIC PANEL
ALT: 11 U/L — ABNORMAL LOW (ref 14–54)
AST: 18 U/L (ref 15–41)
Albumin: 4.1 g/dL (ref 3.5–5.0)
Alkaline Phosphatase: 54 U/L (ref 38–126)
Anion gap: 7 (ref 5–15)
BUN: 10 mg/dL (ref 6–20)
CHLORIDE: 105 mmol/L (ref 101–111)
CO2: 24 mmol/L (ref 22–32)
Calcium: 9.7 mg/dL (ref 8.9–10.3)
Creatinine, Ser: 0.81 mg/dL (ref 0.44–1.00)
GFR calc Af Amer: >60 mL/min (ref >60)
Glucose, Bld: 91 mg/dL (ref 65–99)
POTASSIUM: 4.3 mmol/L (ref 3.5–5.1)
Sodium: 136 mmol/L (ref 135–145)
Total Bilirubin: 0.7 mg/dL (ref 0.3–1.2)
Total Protein: 7.8 g/dL (ref 6.5–8.1)

## 2015-11-30 LAB — CBC
HEMATOCRIT: 42 % (ref 36.0–46.0)
Hemoglobin: 13.7 g/dL (ref 12.0–15.0)
MCH: 31.2 pg (ref 26.0–34.0)
MCHC: 32.6 g/dL (ref 30.0–36.0)
MCV: 95.7 fL (ref 78.0–100.0)
Platelets: 282 10*3/uL (ref 150–400)
RBC: 4.39 MIL/uL (ref 3.87–5.11)
RDW: 13.8 % (ref 11.5–15.5)
WBC: 6.9 10*3/uL (ref 4.0–10.5)

## 2015-11-30 LAB — ABO/RH: ABO/RH(D): AB POS

## 2015-11-30 NOTE — Pre-Procedure Instructions (Signed)
Morgan Cooper  11/30/2015      [facility] - [place], [place] - [address] [place] [address] Phone: [phone] Fax: [phone]  [id number] - [place], [place] - [address] [facility] [place] [address] [place] [address] Phone: [phone] Fax: [phone]    Your procedure is scheduled on 12/07/2015.  Report to [facility] Admitting at 5:30 A.M.  Call this number if you have problems the morning of surgery:  [phone]   Remember:  Do not eat food or drink liquids after midnight.  Take these medicines the morning of surgery with A SIP OF WATER : Prozac, (Pain medicine is ok if needed), Metoprolol, Omeprazole, Imuran & use eye drops     Do not wear jewelry, make-up or nail polish.   Do not wear lotions, powders, or perfumes, or deoderant.   Do not shave 48 hours prior to surgery.    Do not bring valuables to the hospital.   [facility] is not responsible for any belongings or valuables.  Contacts, dentures or bridgework may not be worn into surgery.  Leave your suitcase in the car.  After surgery it may be brought to your room.  For patients admitted to the hospital, discharge time will be determined by your treatment team.  Patients discharged the day of surgery will not be allowed to drive home.   Name and phone number of your driver:   Sister- & daughter   Special instructions:  Special Instructions: [facility] - Preparing for Surgery  Before surgery, you can play an important role.  Because skin is not sterile, your skin needs to be as free of germs as possible.  You can reduce the number of germs on you skin by washing with CHG (chlorahexidine gluconate) soap before surgery.  CHG is an antiseptic cleaner which kills germs and bonds with the skin to continue killing germs even after washing.  Please DO NOT use if you have an allergy to CHG or antibacterial soaps.  If your skin becomes  reddened/irritated stop using the CHG and inform your nurse when you arrive at Short Stay.  Do not shave (including legs and underarms) for at least 48 hours prior to the first CHG shower.  You may shave your face.  Please follow these instructions carefully:   1.  Shower with CHG Soap the night before surgery and the  morning of Surgery.  2.  If you choose to wash your hair, wash your hair first as usual with your  normal shampoo.  3.  After you shampoo, rinse your hair and body thoroughly to remove the  Shampoo.  4.  Use CHG as you would any other liquid soap.  You can apply chg directly to the skin and wash gently with scrungie or a clean washcloth.  5.  Apply the CHG Soap to your body ONLY FROM THE NECK DOWN.    Do not use on open wounds or open sores.  Avoid contact with your eyes, ears, mouth and genitals (private parts).  Wash genitals (private parts)   with your normal soap.  6.  Wash thoroughly, paying special attention to the area where your surgery will be performed.  7.  Thoroughly rinse your body with warm water from the neck down.  8.  DO NOT shower/wash with your normal soap after using and rinsing off   the CHG Soap.  9.  Morgan Cooper  yourself dry with a clean towel.            10.  Wear clean pajamas.            11.  Place clean sheets on your bed the night of your first shower and do not sleep with pets.  Day of Surgery  Do not apply any lotions/deodorants the morning of surgery.  Please wear clean clothes to the hospital/surgery center.  Please read over the following fact sheets that you were given. Pain Booklet, Coughing and Deep Breathing and Surgical Site Infection Prevention

## 2015-11-30 NOTE — Pre-Procedure Instructions (Signed)
Zanayah Smalls Ebarb  11/30/2015      [facility] - [place], [place] - [address] [place] [address] Phone: [phone] Fax: [phone]  [id number] - [place], [place] - [address] [facility] [address] [place] [address] Phone: [phone] Fax: [phone]    Your procedure is scheduled on 12/07/2015.  Report to [facility] Admitting at 5:30 A.M.  Call this number if you have problems the morning of surgery:  [phone]   Remember:  Do not eat food or drink liquids after midnight.  Take these medicines the morning of surgery with A SIP OF WATER : Prozac, (Pain medicine is ok if needed), Metoprolol, Omeprazole    Do not wear jewelry, make-up or nail polish.   Do not wear lotions, powders, or perfumes, or deoderant.   Do not shave 48 hours prior to surgery.    Do not bring valuables to the hospital.   [facility] is not responsible for any belongings or valuables.  Contacts, dentures or bridgework may not be worn into surgery.  Leave your suitcase in the car.  After surgery it may be brought to your room.  For patients admitted to the hospital, discharge time will be determined by your treatment team.  Patients discharged the day of surgery will not be allowed to drive home.   Name and phone number of your driver:   Sister- & daughter   Special instructions:  Special Instructions: [facility] - Preparing for Surgery  Before surgery, you can play an important role.  Because skin is not sterile, your skin needs to be as free of germs as possible.  You can reduce the number of germs on you skin by washing with CHG (chlorahexidine gluconate) soap before surgery.  CHG is an antiseptic cleaner which kills germs and bonds with the skin to continue killing germs even after washing.  Please DO NOT use if you have an allergy to CHG or antibacterial soaps.  If your skin becomes reddened/irritated stop  using the CHG and inform your nurse when you arrive at Short Stay.  Do not shave (including legs and underarms) for at least 48 hours prior to the first CHG shower.  You may shave your face.  Please follow these instructions carefully:   1.  Shower with CHG Soap the night before surgery and the  morning of Surgery.  2.  If you choose to wash your hair, wash your hair first as usual with your  normal shampoo.  3.  After you shampoo, rinse your hair and body thoroughly to remove the  Shampoo.  4.  Use CHG as you would any other liquid soap.  You can apply chg directly to the skin and wash gently with scrungie or a clean washcloth.  5.  Apply the CHG Soap to your body ONLY FROM THE NECK DOWN.    Do not use on open wounds or open sores.  Avoid contact with your eyes, ears, mouth and genitals (private parts).  Wash genitals (private parts)   with your normal soap.  6.  Wash thoroughly, paying special attention to the area where your surgery will be performed.  7.  Thoroughly rinse your body with warm water from the neck down.  8.  DO NOT shower/wash with your normal soap after using and rinsing off   the CHG Soap.  9.  Pat yourself dry with a clean towel.  10.  Wear clean pajamas.            11.  Place clean sheets on your bed the night of your first shower and do not sleep with pets.  Day of Surgery  Do not apply any lotions/deodorants the morning of surgery.  Please wear clean clothes to the hospital/surgery center.  Please read over the following fact sheets that you were given. Pain Booklet, Coughing and Deep Breathing and Surgical Site Infection Prevention

## 2015-11-30 NOTE — Progress Notes (Signed)
Pt. Will be seen by A. Zelenak,PA-C. Pt. Denies any new chest issues. Pt. Does admit that she is not taking Spirivia for a at least a month.

## 2015-11-30 NOTE — Progress Notes (Signed)
Conversation with Sigmund Hazel, PAC, regarding pt. History, which will be further reviewed .

## 2015-11-30 NOTE — Progress Notes (Signed)
Pt. Admits that she is not using the Spirivia inhaler for at least a month now.  Call to [facility], [place]- at Dr. Marlowe Aschoff office, she will pass on to Dr. B. That preop orders are needed.

## 2015-11-30 NOTE — Progress Notes (Addendum)
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 Barry Dienes (first case). She has had a known pancreatic lesion dating back to 2008, but it has showin progressive enlargement.   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  - PCP is listed as Dr. Kelton Pillar, but apparently she is no longer with [facility]. Patient is now seeing "Dr. Clayton Bibles."  - GI is Dr. Silverio Decamp. - Cardiologist is Dr. Irish Lack, last visit with [name], NP on 01/19/15. She referred patient to pulmonology due to finding of pulmonary hypertension on echo.  - Pulmonologist is Dr. Lamonte Sakai, last visit 08/11/15. He plans to repeat her chest CT in December to reevaluate ILD. She was re-evaluated following her 12/2014 echo findings of pulmonary hypertension. Patient subsequently had overnight oximetry which confirmed desaturation that was treated successfully with 1L/[place]. A sleep study performed on 03/24/15 showed no evidence of obstructive sleep apnea or central sleep apnea. She also underwent immune testing that showed a weakly positive ANA (1:80), negative dsDNA, normal ACE level, negative scleroderma antibody, negative RF, negative Sjogren's antibodies. A high-resolution CT scan of the chest was performed on 02/26/15 that showed mild anterior basilar interstitial changes without significant groundglass. There was some traction bronchiectasis and evidence of mild honeycomb change and a solitary  LUL 3 mm nodule that needs one year follow-up. 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  - Nephrologist is Dr. Edrick Oh with [facility]. - Neurologist is Dr. Leonie Man with [facility]. - ENT is Dr. Erik Obey.  Meds include aspirin 81 mg, Lipitor, Imuran, Prozac, Lasix as needed, Norco, Lopressor, Prilosec, Zofran, oxygen (2L/[place] nocturnal and PRN activity), prednisone, Seroquel, Spiriva, trimethoprim (for UTI prevention). She was advised to contact Dr. Marlowe Aschoff office for perioperative instructions regarding aspirin.  BP (!) 152/71   Pulse (!) 55   Temp 36.7 C   Resp 20   Ht 5\' 5"  (1.651 m)   Wt 122 lb 12.8 oz (55.7 kg)   SpO2 95%   BMI 20.43 kg/m  She reports that she feels at her baseline. No recent respiratory illness. No change in symptoms since her last visit with Dr. Lamonte Sakai. No SOB at rest. Some DOE which she feels is stable. She is able to do light house work and [employer]. She has occasional mild LE edema. She takes Lasix usually about once a week if she feels she is more SOB or retaining fluid. She sleep on two pillows. No conversational dyspnea noted. She denied history of known anesthesia complication or prolonged intubation. She has a dental bridge and crowns. Somewhat small mouth opening, but denied history of difficult intubation. Mallampati II. Heart RRR, no murmur noted. Lungs clear. No LE edema noted. She has a slender build.   02/03/15 Nuclear stress test: Overall Study Impression Myocardial perfusion is normal. The study is normal. This is a low risk study. Overall left ventricular systolic function was normal. LV cavity size is small. Nuclear stress EF: 72%. The left ventricular ejection fraction is hyperdynamic (>65%). There are no significant changes in comparison to the  prior study.   02/02/15 Echo: Study Conclusions - Left ventricle: The cavity size was  normal. Wall thickness was   increased in a pattern of mild LVH. Systolic function was normal.   The estimated ejection fraction was in the range of 55% to 60%.   Wall motion was normal; there were no regional wall motion   abnormalities. Features are consistent with a pseudonormal left   ventricular filling pattern, with concomitant abnormal relaxation   and increased filling pressure (grade 2 diastolic dysfunction). - Aortic valve: There was no stenosis. There was trivial   regurgitation. - Mitral valve: There was trivial regurgitation. - Left atrium: The atrium was severely dilated. - Right ventricle: The cavity size was normal. Systolic function   was normal. - Tricuspid valve: Peak RV-RA gradient (S): 56 mm Hg. - Pulmonary arteries: PA peak pressure: 59 mm Hg (S). - Inferior vena cava: The vessel was normal in size. The   respirophasic diameter changes were in the normal range (>= 50%),   consistent with normal central venous pressure. Impressions: - Normal LV size with mild LV hypertrophy. EF 55-60%. Moderate   diastolic dysfunction. Normal RV size and systolic function.   Moderate pulmonary hypertension. Severe LAE.  01/19/15 EKG: SB at 51 bpm.  07/29/13-08/27/13 Event monitor: SR, sinus bradycardia, PACs. No afib. 1 episode of tachycardia (SVT) to 128 bpm.  09/02/13 Carotid U/S: Impression: This study is negative for hemodynamically significant stenosis involving extracranial carotid arteries bilaterally. Mild wall thickening was seen in the right carotid bulb as well as acoustic shadow plaque in the left carotid bulb. There was normal antegrade blood flow seen in the bilateral vertebral arteries.  05/11/15 CXR: IMPRESSION: Chronic interstitial lung disease.  No acute findings.  02/26/15 Chest CT High Resolution: IMPRESSION: 1. Interstitial lung disease characterized by patchy regions of subpleural reticulation, mild ground-glass attenuation and traction bronchiectasis in the mid  to lower lungs. Small region of honeycombing in the right upper lobe. Findings have slowly progressed at the lung bases on multiple CT abdomen studies back to 12/02/2010 and were absent on the 02/06/2007 CT abdomen study. 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 Follow-up high-resolution chest CT in 6-12 months would be useful to assess for temporal change. 2. Mild centrilobular, paraseptal and bullous emphysema. 3. Solitary 3 mm left upper lobe pulmonary nodule. Given risk factors for bronchogenic carcinoma, follow-up chest CT at 1 year is recommended. This recommendation follows the consensus statement: Guidelines for Management of Small Pulmonary Nodules Detected on CT Scans: A Statement from the [employer] as published in Radiology 2005; 237:395-400. 4. Mild cardiomegaly, increased. Left main and 3 vessel coronary atherosclerosis. 5. Dilated main pulmonary artery, suggesting pulmonary arterial hypertension. 6. 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  10/13/13 PFTs: FVC 2.19 (69%), FEV1 1.61 (67%), DLCOunc 9.02 (35%). Moderate obstructive airways disease. Moderate restriction-parenchymal. Severe diffusion defect. Insignificant response to bronchodilator.  Preoperative labs noted. Cr 0.81. CBC WNL.   Reviewed above with anesthesiologist Dr. Gifford Shave. Patient with cardiac and pulmonary testing within the past year. She is on nocturnal oxygen. She fells at her baseline. If no acute changes then it is anticipated that she can proceed as planned.  [facility] Short Stay Center/Anesthesiology Phone [phone] 11/30/2015 4:56 PM

## 2015-12-01 ENCOUNTER — Other Ambulatory Visit: Payer: Self-pay | Admitting: General Surgery

## 2015-12-01 NOTE — H&P (Signed)
Morgan Cooper  Location: [facility] Patient #: [id number] DOB: 20-May-1945 Widowed / Language: Cleophus Molt / Race: White Female   History of Present Illness The patient is a 70 year old female who presents with a pancreatic mass. Patient is a 70 year old female referred by Dr. Silverio Decamp for a enlarging cystic pancreatic lesion. 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 She also has a history of Barrett's esophagus and renal transplant. Her kidney transplant was 40 years ago and has been working quite well. She is maintained on Imuran and prednisone. She has a history of heart failure in her diagnosis list, but is not on any heart failure medications like carvedilol or ACE inhibitor. Her last echo was in December 2016 showing grade 2 diastolic dysfunction. She has started having some weight loss and left upper quadrant/back pain. She has no appetite. She had a known pancreatic lesion going back to 2008. The mass has been progressively enlarging. She denies jaundice. She denies history of diabetes.    MRI 6/26 FINDINGS: Lower chest: Grossly stable without acute findings.  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 No focal hepatic abnormalities are seen. No evidence of gallstones, gallbladder wall thickening or biliary dilatation.  Pancreas: Again demonstrated is a large multi-septated pancreatic mass arising from the distal pancreatic body and tail. This currently measures 9.5 x 6.6 x 7.0 cm. On the most recent CT, the lesion measured 8.5 x 5.7 x 6.5 cm. This lesion measured approximately 5.8 x 3.9 cm in 2008. There is enhancement of multiple septations within this lesion but no significant solid components. As before, the lesion is associated with the splenic vein which  is posteriorly displaced. It may communicate with the main pancreatic duct which is not dilated.  Spleen: Normal in size without focal abnormality.  Adrenals/Urinary Tract: Both adrenal glands appear normal. The native kidneys are markedly atrophic. There are cystic lesions in both mid kidneys without suspicious enhancement. The renal transplant in the left iliac fossa is partially imaged on the coronal images, grossly stable.  Stomach/Bowel: No evidence of bowel wall thickening, distention or surrounding inflammatory change.  Vascular/Lymphatic: There are no enlarged abdominal lymph nodes. There is extensive aortoiliac atherosclerosis. The aorta is diffusely ectatic, measuring up to 3.9 cm transverse, similar to recent prior studies.  Other: Ventral hernia is incompletely visualized, although grossly stable. There is no ascites.  Musculoskeletal: No acute or significant osseous findings.  IMPRESSION: 1. Continued slow growth of multi-septated cystic pancreatic lesion (present since at least 2003 based on prior reports). 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 2. No evidence pancreatic or biliary ductal dilatation. 3. Grossly stable appearance of the native kidneys with marked cortical thinning and cyst formation bilaterally. 4. Aortic atherosclerosis and infrarenal aneurysm, grossly stable   Other Problems  Anxiety Disorder Bladder Problems Chest pain Chronic Obstructive Lung Disease Chronic Renal Failure Syndrome Congestive Heart Failure Depression Diverticulosis Gastroesophageal Reflux Disease Heart murmur High blood pressure Home Oxygen Use Hypercholesterolemia Thyroid Disease  Past Surgical History Appendectomy Cataract Surgery Bilateral. Dialysis Shunt / Fistula Thyroid Surgery  Diagnostic Studies History   Mammogram within last year Pap Smear >5 years ago  Allergies Amoxicillin *PENICILLINS*  Medication History LORazepam (1MG  Tablet, Oral as needed) Active. FLUoxetine HCl (20MG  Capsule,  Oral) Active. Furosemide (20MG  Tablet, Oral) Active. Metoprolol Tartrate (50MG  Tablet, Oral) Active. Omeprazole (40MG  Capsule DR, Oral) Active. Restasis (0.05% Emulsion, Ophthalmic) Active. Atorvastatin Calcium (20MG  Tablet, Oral) Active. AzaTHIOprine (50MG  Tablet, Oral) Active. Medications Reconciled  Pregnancy / Birth History Age at menarche 50 years. Age of menopause 28-50 Gravida 2 Maternal age 45-20    Review of Systems General Present- Appetite Loss, Chills, Fatigue, Night Sweats and Weight Loss. Not Present- Fever and Weight Gain. HEENT Present- Visual Disturbances. Not Present- Earache, Hearing Loss, Hoarseness, Nose Bleed, Oral Ulcers, Ringing in the Ears, Seasonal Allergies, Sinus Pain, Sore Throat, Wears glasses/contact lenses and Yellow Eyes. Respiratory Present- Difficulty Breathing. Not Present- Bloody sputum, Chronic Cough, Snoring and Wheezing. Breast Not Present- Breast Mass, Breast Pain, Nipple Discharge and Skin Changes. Cardiovascular Present- Difficulty Breathing Lying Down, Palpitations, Rapid Heart Rate and Shortness of Breath. Not Present- Chest Pain, Leg Cramps and Swelling of Extremities. Gastrointestinal Present- Abdominal Pain, Bloating, Difficulty Swallowing, Gets full quickly at meals, Indigestion and Nausea. Not Present- Bloody Stool, Change in Bowel Habits, Chronic diarrhea, Constipation, Excessive gas, Hemorrhoids, Rectal Pain and Vomiting. Female Genitourinary Present- Pelvic Pain and Urgency. Not Present- Frequency, Nocturia and Painful Urination. Musculoskeletal Present- Back Pain, Joint Stiffness, Muscle Pain, Muscle Weakness and Swelling of Extremities. Not Present- Joint Pain. Neurological Present- Decreased Memory, Headaches, Numbness, Tingling  and Weakness. Not Present- Fainting, Seizures, Tremor and Trouble walking. Psychiatric Present- Depression. Not Present- Anxiety, Bipolar, Change in Sleep Pattern, Fearful and Frequent crying. Endocrine Present- Cold Intolerance and Heat Intolerance. Not Present- Excessive Hunger, Hair Changes, Hot flashes and New Diabetes. Hematology Present- Easy Bruising. Not Present- Blood Thinners, Excessive bleeding, Gland problems, HIV and Persistent Infections.  Vitals Weight: 119 lb Height: 65in Body Surface Area: 1.59 m Body Mass Index: 19.8 kg/m  Temp.: 41F(Temporal)  Pulse: 79 (Regular)  BP: 126/80 (Sitting, Left Arm, Standard)       Physical Exam  General Mental Status-Alert. General Appearance-Consistent with stated age. Hydration-Well hydrated. Voice-Normal.  Head and Neck Head-normocephalic, atraumatic with no lesions or palpable masses. Trachea-midline. Thyroid Gland Characteristics - normal size and consistency.  Eye Eyeball - Bilateral-Extraocular movements intact. Sclera/Conjunctiva - Bilateral-No scleral icterus.  Chest and Lung Exam Chest and lung exam reveals -quiet, even and easy respiratory effort with no use of accessory muscles and on auscultation, normal breath sounds, no adventitious sounds and normal vocal resonance. Inspection Chest Wall - Normal. Back - normal.  Cardiovascular Cardiovascular examination reveals -normal heart sounds, regular rate and rhythm with no murmurs and normal pedal pulses bilaterally.  Abdomen Inspection Inspection of the abdomen reveals - No Hernias. Palpation/Percussion Palpation and Percussion of the abdomen reveal - Soft, Non Tender, No Rebound tenderness, No Rigidity (guarding) and No hepatosplenomegaly. Auscultation Auscultation of the abdomen reveals - Bowel sounds normal. Note: fullness in the LUQ   Neurologic Neurologic evaluation reveals -alert and oriented x 3 with no  impairment of recent or remote memory. Mental Status-Normal.  Musculoskeletal Global Assessment -Note: no gross deformities.  Normal Exam - Left-Upper Extremity Strength Normal and Lower Extremity Strength Normal. Normal Exam - Right-Upper Extremity Strength Normal and Lower Extremity Strength Normal.  Lymphatic Head & Neck  General Head & Neck Lymphatics: Bilateral - Description - Normal. Axillary  General Axillary Region: Bilateral - Description - Normal. Tenderness - Non Tender. Femoral & Inguinal  Generalized Femoral & Inguinal Lymphatics: Bilateral - Description - No Generalized lymphadenopathy.    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 Unfortunately, it has been enlarging and is starting to cause her symptoms of early satiety and weight loss. She is of moderate health. I think that she will outlive this mass and that it will continue to grow and enlarge. It is not, become easier to take out. At this size is now, I think we would be able to do a hand-assisted laparoscopic approach. However if it were to get much larger, we may need to do a open distal pancreatectomy. I discussed with the patient that unless her pancreas separated very easily from the splenic vessels, I would plan to take the spleen. Given her kidney that has been working so well for 40 years, I would want to minimize her time under anesthesia and her risk of blood loss.  I reviewed the risk of pancreatic leak, hernia, bleeding, infection, damage to adjacent structures, heart or lung complications, possible death. I discussed that this is not a surgery that she absolutely has to have a she is super uncomfortable or scared. I think that based on the risks of that continuing to grow compared to the risks of her current medical problems, I think that she is only going to become more  symptomatic from the mass and more and healthy for her medical problems. I discussed that she certainly is at risk for complications and that we would need to have her go home with family member after surgery.  45 min spent in evaluation, examination, counseling, and coordination of care. >50% spent in counseling. Current Plans You are being scheduled for surgery - Our schedulers will call you.  You should hear from our office's scheduling department within 5 working days about the location, date, and time of surgery. We try to make accommodations for patient's preferences in scheduling surgery, but sometimes the OR schedule or the surgeon's schedule prevents [place] from making those accommodations.  If you have not heard from our office [phone]) in 5 working days, call the office and ask for your surgeon's nurse.  If you have other questions about your diagnosis, plan, or surgery, call the office and ask for your surgeon's nurse.  Pt Education - FB pancreatectomy   Signed by Stark Klein, MD

## 2015-12-03 DIAGNOSIS — Z23 Encounter for immunization: Secondary | ICD-10-CM | POA: Diagnosis not present

## 2015-12-06 MED ORDER — CIPROFLOXACIN IN D5W 400 MG/200ML IV SOLN
400.0000 mg | INTRAVENOUS | Status: AC
  Administered 2015-12-07: 400 mg via INTRAVENOUS
  Filled 2015-12-06: qty 200

## 2015-12-07 ENCOUNTER — Inpatient Hospital Stay (HOSPITAL_COMMUNITY)
Admission: RE | Admit: 2015-12-07 | Discharge: 2015-12-14 | DRG: 406 | Disposition: A | Discharge status: Home Health | Payer: Medicare Other | Source: Ambulatory Visit | Attending: General Surgery | Admitting: General Surgery

## 2015-12-07 ENCOUNTER — Encounter (HOSPITAL_COMMUNITY): Payer: Self-pay | Admitting: Urology

## 2015-12-07 ENCOUNTER — Encounter (HOSPITAL_COMMUNITY)
Admission: RE | Disposition: A | Discharge status: Home Health | Payer: Self-pay | Source: Ambulatory Visit | Attending: General Surgery

## 2015-12-07 ENCOUNTER — Inpatient Hospital Stay (HOSPITAL_COMMUNITY): Payer: Medicare Other | Admitting: Vascular Surgery

## 2015-12-07 ENCOUNTER — Inpatient Hospital Stay (HOSPITAL_COMMUNITY): Payer: Medicare Other | Admitting: Anesthesiology

## 2015-12-07 DIAGNOSIS — K2270 Barrett's esophagus without dysplasia: Secondary | ICD-10-CM | POA: Diagnosis present

## 2015-12-07 DIAGNOSIS — K660 Peritoneal adhesions (postprocedural) (postinfection): Secondary | ICD-10-CM | POA: Diagnosis present

## 2015-12-07 DIAGNOSIS — K219 Gastro-esophageal reflux disease without esophagitis: Secondary | ICD-10-CM | POA: Diagnosis not present

## 2015-12-07 DIAGNOSIS — I110 Hypertensive heart disease with heart failure: Secondary | ICD-10-CM | POA: Diagnosis present

## 2015-12-07 DIAGNOSIS — R630 Anorexia: Secondary | ICD-10-CM | POA: Diagnosis present

## 2015-12-07 DIAGNOSIS — K449 Diaphragmatic hernia without obstruction or gangrene: Secondary | ICD-10-CM | POA: Diagnosis present

## 2015-12-07 DIAGNOSIS — I722 Aneurysm of renal artery: Secondary | ICD-10-CM | POA: Diagnosis present

## 2015-12-07 DIAGNOSIS — Z940 Kidney transplant status: Secondary | ICD-10-CM

## 2015-12-07 DIAGNOSIS — Z79899 Other long term (current) drug therapy: Secondary | ICD-10-CM | POA: Diagnosis not present

## 2015-12-07 DIAGNOSIS — G4730 Sleep apnea, unspecified: Secondary | ICD-10-CM | POA: Diagnosis present

## 2015-12-07 DIAGNOSIS — I739 Peripheral vascular disease, unspecified: Secondary | ICD-10-CM | POA: Diagnosis present

## 2015-12-07 DIAGNOSIS — Z87891 Personal history of nicotine dependence: Secondary | ICD-10-CM | POA: Diagnosis not present

## 2015-12-07 DIAGNOSIS — E871 Hypo-osmolality and hyponatremia: Secondary | ICD-10-CM | POA: Diagnosis not present

## 2015-12-07 DIAGNOSIS — Z6821 Body mass index (BMI) 21.0-21.9, adult: Secondary | ICD-10-CM

## 2015-12-07 DIAGNOSIS — Z8673 Personal history of transient ischemic attack (TIA), and cerebral infarction without residual deficits: Secondary | ICD-10-CM

## 2015-12-07 DIAGNOSIS — J449 Chronic obstructive pulmonary disease, unspecified: Secondary | ICD-10-CM | POA: Diagnosis present

## 2015-12-07 DIAGNOSIS — D136 Benign neoplasm of pancreas: Principal | ICD-10-CM | POA: Diagnosis present

## 2015-12-07 DIAGNOSIS — M169 Osteoarthritis of hip, unspecified: Secondary | ICD-10-CM | POA: Diagnosis not present

## 2015-12-07 DIAGNOSIS — I509 Heart failure, unspecified: Secondary | ICD-10-CM | POA: Diagnosis not present

## 2015-12-07 DIAGNOSIS — J8410 Pulmonary fibrosis, unspecified: Secondary | ICD-10-CM | POA: Diagnosis present

## 2015-12-07 DIAGNOSIS — F418 Other specified anxiety disorders: Secondary | ICD-10-CM | POA: Diagnosis present

## 2015-12-07 DIAGNOSIS — I2729 Other secondary pulmonary hypertension: Secondary | ICD-10-CM | POA: Diagnosis present

## 2015-12-07 DIAGNOSIS — I700 Atherosclerosis of aorta: Secondary | ICD-10-CM | POA: Diagnosis present

## 2015-12-07 DIAGNOSIS — M1990 Unspecified osteoarthritis, unspecified site: Secondary | ICD-10-CM | POA: Diagnosis present

## 2015-12-07 DIAGNOSIS — K8689 Other specified diseases of pancreas: Secondary | ICD-10-CM | POA: Diagnosis not present

## 2015-12-07 DIAGNOSIS — K869 Disease of pancreas, unspecified: Secondary | ICD-10-CM | POA: Diagnosis not present

## 2015-12-07 DIAGNOSIS — I5032 Chronic diastolic (congestive) heart failure: Secondary | ICD-10-CM | POA: Diagnosis present

## 2015-12-07 DIAGNOSIS — E7800 Pure hypercholesterolemia, unspecified: Secondary | ICD-10-CM | POA: Diagnosis present

## 2015-12-07 HISTORY — PX: PANCREATECTOMY: SHX5261

## 2015-12-07 LAB — POCT I-STAT 4, (NA,K, GLUC, HGB,HCT)
Glucose, Bld: 151 mg/dL — ABNORMAL HIGH (ref 65–99)
HEMATOCRIT: 29 % — AB (ref 36.0–46.0)
Hemoglobin: 9.9 g/dL — ABNORMAL LOW (ref 12.0–15.0)
Potassium: 4.3 mmol/L (ref 3.5–5.1)
SODIUM: 140 mmol/L (ref 135–145)

## 2015-12-07 LAB — GLUCOSE, CAPILLARY
GLUCOSE-CAPILLARY: 229 mg/dL — AB (ref 65–99)
GLUCOSE-CAPILLARY: 211 mg/dL — AB (ref 65–99)
Glucose-Capillary: 212 mg/dL — ABNORMAL HIGH (ref 65–99)

## 2015-12-07 LAB — CBC WITH DIFFERENTIAL/PLATELET
Basophils Absolute: 0 10*3/uL (ref 0.0–0.1)
Basophils Relative: 1 %
EOS ABS: 0.1 10*3/uL (ref 0.0–0.7)
EOS PCT: 1 %
HCT: 38.6 % (ref 36.0–46.0)
Hemoglobin: 13.1 g/dL (ref 12.0–15.0)
LYMPHS ABS: 1.3 10*3/uL (ref 0.7–4.0)
Lymphocytes Relative: 22 %
MCH: 32.2 pg (ref 26.0–34.0)
MCHC: 33.9 g/dL (ref 30.0–36.0)
MCV: 94.8 fL (ref 78.0–100.0)
MONOS PCT: 11 %
Monocytes Absolute: 0.6 10*3/uL (ref 0.1–1.0)
Neutro Abs: 3.7 10*3/uL (ref 1.7–7.7)
Neutrophils Relative %: 65 %
PLATELETS: 244 10*3/uL (ref 150–400)
RBC: 4.07 MIL/uL (ref 3.87–5.11)
RDW: 13.6 % (ref 11.5–15.5)
WBC: 5.7 10*3/uL (ref 4.0–10.5)

## 2015-12-07 LAB — PREPARE RBC (CROSSMATCH)

## 2015-12-07 LAB — MRSA PCR SCREENING: MRSA by PCR: NEGATIVE

## 2015-12-07 SURGERY — PANCREATECTOMY, LAPAROSCOPIC
Anesthesia: General | Site: Abdomen

## 2015-12-07 MED ORDER — ONDANSETRON HCL 4 MG/2ML IJ SOLN: 4.0000 mg | Freq: Once | INTRAMUSCULAR | Status: DC | PRN

## 2015-12-07 MED ORDER — FENTANYL CITRATE (PF) 100 MCG/2ML IJ SOLN
25.0000 ug | INTRAMUSCULAR | Status: DC | PRN
  Administered 2015-12-07 (×2): 50 ug via INTRAVENOUS

## 2015-12-07 MED ORDER — ONDANSETRON HCL 4 MG/2ML IJ SOLN
INTRAMUSCULAR | Status: AC
  Filled 2015-12-07: qty 8

## 2015-12-07 MED ORDER — ATROPINE SULFATE 1 MG/ML IJ SOLN
INTRAMUSCULAR | Status: AC
Start: 2015-12-07 — End: 2015-12-07
  Filled 2015-12-07: qty 1

## 2015-12-07 MED ORDER — CHLORHEXIDINE GLUCONATE CLOTH 2 % EX PADS: 6.0000 | MEDICATED_PAD | Freq: Once | CUTANEOUS | Status: DC

## 2015-12-07 MED ORDER — FENTANYL CITRATE (PF) 100 MCG/2ML IJ SOLN
INTRAMUSCULAR | Status: AC
  Filled 2015-12-07: qty 4

## 2015-12-07 MED ORDER — SODIUM CHLORIDE 0.9 % IJ SOLN
INTRAMUSCULAR | Status: AC
  Filled 2015-12-07: qty 20

## 2015-12-07 MED ORDER — LORAZEPAM 1 MG PO TABS: 1.0000 mg | ORAL_TABLET | Freq: Three times a day (TID) | ORAL | Status: DC

## 2015-12-07 MED ORDER — ACETAMINOPHEN 325 MG PO TABS
650.0000 mg | ORAL_TABLET | Freq: Four times a day (QID) | ORAL | Status: DC | PRN
  Administered 2015-12-10 – 2015-12-11 (×2): 650 mg via ORAL
  Filled 2015-12-07 (×2): qty 2

## 2015-12-07 MED ORDER — SUCCINYLCHOLINE CHLORIDE 200 MG/10ML IV SOSY
PREFILLED_SYRINGE | INTRAVENOUS | Status: AC
  Filled 2015-12-07: qty 30

## 2015-12-07 MED ORDER — SIMETHICONE 80 MG PO CHEW
40.0000 mg | CHEWABLE_TABLET | Freq: Four times a day (QID) | ORAL | Status: DC | PRN
  Administered 2015-12-12 – 2015-12-13 (×2): 40 mg via ORAL
  Filled 2015-12-07 (×3): qty 1

## 2015-12-07 MED ORDER — GLYCOPYRROLATE 0.2 MG/ML IJ SOLN
INTRAMUSCULAR | Status: DC | PRN
  Administered 2015-12-07: 0.2 mg via INTRAVENOUS
  Administered 2015-12-07: .2 mg via INTRAVENOUS

## 2015-12-07 MED ORDER — SENNA 8.6 MG PO TABS
1.0000 | ORAL_TABLET | Freq: Two times a day (BID) | ORAL | Status: DC
  Administered 2015-12-07 – 2015-12-14 (×9): 8.6 mg via ORAL
  Filled 2015-12-07 (×10): qty 1

## 2015-12-07 MED ORDER — NEOSTIGMINE METHYLSULFATE 5 MG/5ML IV SOSY
PREFILLED_SYRINGE | INTRAVENOUS | Status: AC
Start: 2015-12-07 — End: 2015-12-07
  Filled 2015-12-07: qty 10

## 2015-12-07 MED ORDER — ONDANSETRON HCL 4 MG/2ML IJ SOLN
4.0000 mg | Freq: Four times a day (QID) | INTRAMUSCULAR | Status: DC | PRN
  Filled 2015-12-07: qty 2

## 2015-12-07 MED ORDER — LIDOCAINE 2% (20 MG/ML) 5 ML SYRINGE
INTRAMUSCULAR | Status: AC
  Filled 2015-12-07: qty 15

## 2015-12-07 MED ORDER — HYDRALAZINE HCL 20 MG/ML IJ SOLN: 10.0000 mg | INTRAMUSCULAR | Status: DC | PRN

## 2015-12-07 MED ORDER — MIDAZOLAM HCL 5 MG/5ML IJ SOLN
INTRAMUSCULAR | Status: DC | PRN
  Administered 2015-12-07 (×2): 1 mg via INTRAVENOUS

## 2015-12-07 MED ORDER — 0.9 % SODIUM CHLORIDE (POUR BTL) OPTIME
TOPICAL | Status: DC | PRN
  Administered 2015-12-07 (×2): 1000 mL

## 2015-12-07 MED ORDER — LIDOCAINE HCL 1 % IJ SOLN
INTRAMUSCULAR | Status: DC | PRN
  Administered 2015-12-07: 9 mL

## 2015-12-07 MED ORDER — ONDANSETRON HCL 4 MG/2ML IJ SOLN
INTRAMUSCULAR | Status: DC | PRN
  Administered 2015-12-07: 4 mg via INTRAVENOUS

## 2015-12-07 MED ORDER — PROMETHAZINE HCL 25 MG/ML IJ SOLN
12.5000 mg | Freq: Four times a day (QID) | INTRAMUSCULAR | Status: DC | PRN
  Administered 2015-12-07 – 2015-12-08 (×3): 12.5 mg via INTRAVENOUS
  Filled 2015-12-07 (×4): qty 1

## 2015-12-07 MED ORDER — DIPHENHYDRAMINE HCL 50 MG/ML IJ SOLN: 12.5000 mg | Freq: Four times a day (QID) | INTRAMUSCULAR | Status: DC | PRN

## 2015-12-07 MED ORDER — SODIUM CHLORIDE 0.9% FLUSH: 9.0000 mL | INTRAVENOUS | Status: DC | PRN

## 2015-12-07 MED ORDER — ONDANSETRON HCL 4 MG/2ML IJ SOLN
INTRAMUSCULAR | Status: AC
  Filled 2015-12-07: qty 2

## 2015-12-07 MED ORDER — KCL IN DEXTROSE-NACL 10-5-0.45 MEQ/L-%-% IV SOLN
INTRAVENOUS | Status: DC
  Administered 2015-12-07 – 2015-12-09 (×3): via INTRAVENOUS
  Filled 2015-12-07 (×5): qty 1000

## 2015-12-07 MED ORDER — ZOLPIDEM TARTRATE 5 MG PO TABS: 5.0000 mg | ORAL_TABLET | Freq: Every evening | ORAL | Status: DC | PRN

## 2015-12-07 MED ORDER — FENTANYL CITRATE (PF) 100 MCG/2ML IJ SOLN
INTRAMUSCULAR | Status: AC
  Filled 2015-12-07: qty 2

## 2015-12-07 MED ORDER — EPHEDRINE SULFATE 50 MG/ML IJ SOLN
INTRAMUSCULAR | Status: DC | PRN
  Administered 2015-12-07: 5 mg via INTRAVENOUS

## 2015-12-07 MED ORDER — PROPOFOL 10 MG/ML IV BOLUS
INTRAVENOUS | Status: DC | PRN
  Administered 2015-12-07: 190 mg via INTRAVENOUS

## 2015-12-07 MED ORDER — ROCURONIUM BROMIDE 100 MG/10ML IV SOLN
INTRAVENOUS | Status: DC | PRN
  Administered 2015-12-07: 50 mg via INTRAVENOUS
  Administered 2015-12-07: 10 mg via INTRAVENOUS

## 2015-12-07 MED ORDER — PROPOFOL 10 MG/ML IV BOLUS
INTRAVENOUS | Status: AC
  Filled 2015-12-07: qty 40

## 2015-12-07 MED ORDER — HYDROCORTISONE NA SUCCINATE PF 100 MG IJ SOLR
INTRAMUSCULAR | Status: DC | PRN
  Administered 2015-12-07: 100 mg via INTRAVENOUS

## 2015-12-07 MED ORDER — GLYCOPYRROLATE 0.2 MG/ML IV SOSY
PREFILLED_SYRINGE | INTRAVENOUS | Status: AC
  Filled 2015-12-07: qty 6

## 2015-12-07 MED ORDER — LIDOCAINE HCL (PF) 1 % IJ SOLN
INTRAMUSCULAR | Status: AC
  Filled 2015-12-07: qty 30

## 2015-12-07 MED ORDER — PHENYLEPHRINE 40 MCG/ML (10ML) SYRINGE FOR IV PUSH (FOR BLOOD PRESSURE SUPPORT)
PREFILLED_SYRINGE | INTRAVENOUS | Status: AC
  Filled 2015-12-07: qty 20

## 2015-12-07 MED ORDER — ACETAMINOPHEN 650 MG RE SUPP: 650.0000 mg | Freq: Four times a day (QID) | RECTAL | Status: DC | PRN

## 2015-12-07 MED ORDER — METOPROLOL TARTRATE 25 MG PO TABS
25.0000 mg | ORAL_TABLET | Freq: Two times a day (BID) | ORAL | Status: DC
  Administered 2015-12-07 – 2015-12-08 (×2): 25 mg via ORAL
  Filled 2015-12-07 (×2): qty 1

## 2015-12-07 MED ORDER — TRIMETHOPRIM 100 MG PO TABS
100.0000 mg | ORAL_TABLET | Freq: Every day | ORAL | Status: DC
  Administered 2015-12-07 – 2015-12-13 (×7): 100 mg via ORAL
  Filled 2015-12-07 (×7): qty 1

## 2015-12-07 MED ORDER — BISACODYL 5 MG PO TBEC: 5.0000 mg | DELAYED_RELEASE_TABLET | Freq: Every day | ORAL | Status: DC | PRN

## 2015-12-07 MED ORDER — SODIUM CHLORIDE 0.9 % IV SOLN: Freq: Once | INTRAVENOUS | Status: DC

## 2015-12-07 MED ORDER — FENTANYL 40 MCG/ML IV SOLN
INTRAVENOUS | Status: DC
  Administered 2015-12-07: 240 ug via INTRAVENOUS
  Administered 2015-12-07: 110 ug via INTRAVENOUS
  Administered 2015-12-07: 11:00:00 via INTRAVENOUS
  Administered 2015-12-08: 110 ug via INTRAVENOUS
  Administered 2015-12-08: 120 ug via INTRAVENOUS
  Administered 2015-12-08: 110 ug via INTRAVENOUS

## 2015-12-07 MED ORDER — DIPHENHYDRAMINE HCL 12.5 MG/5ML PO ELIX: 12.5000 mg | ORAL_SOLUTION | Freq: Four times a day (QID) | ORAL | Status: DC | PRN

## 2015-12-07 MED ORDER — INSULIN ASPART 100 UNIT/ML ~~LOC~~ SOLN
0.0000 [IU] | SUBCUTANEOUS | Status: DC
  Administered 2015-12-07 (×2): 5 [IU] via SUBCUTANEOUS
  Administered 2015-12-08: 2 [IU] via SUBCUTANEOUS

## 2015-12-07 MED ORDER — PREDNISONE 5 MG PO TABS
7.5000 mg | ORAL_TABLET | Freq: Every day | ORAL | Status: DC
  Administered 2015-12-08 – 2015-12-14 (×7): 7.5 mg via ORAL
  Filled 2015-12-07: qty 2
  Filled 2015-12-07 (×4): qty 1
  Filled 2015-12-07 (×2): qty 2

## 2015-12-07 MED ORDER — SODIUM CHLORIDE 0.9 % IR SOLN
Status: DC | PRN
  Administered 2015-12-07 (×2): 1000 mL

## 2015-12-07 MED ORDER — BUPIVACAINE ON-Q PAIN PUMP (FOR ORDER SET NO CHG)
INJECTION | Status: DC
  Filled 2015-12-07: qty 1

## 2015-12-07 MED ORDER — ATORVASTATIN CALCIUM 20 MG PO TABS
20.0000 mg | ORAL_TABLET | Freq: Every day | ORAL | Status: DC
Start: 2015-12-07 — End: 2015-12-14
  Administered 2015-12-09 – 2015-12-13 (×5): 20 mg via ORAL
  Filled 2015-12-07 (×5): qty 1

## 2015-12-07 MED ORDER — FENTANYL 40 MCG/ML IV SOLN
INTRAVENOUS | Status: AC
  Filled 2015-12-07: qty 25

## 2015-12-07 MED ORDER — EPHEDRINE 5 MG/ML INJ
INTRAVENOUS | Status: AC
  Filled 2015-12-07: qty 40

## 2015-12-07 MED ORDER — FLUOXETINE HCL 20 MG PO CAPS
60.0000 mg | ORAL_CAPSULE | Freq: Every day | ORAL | Status: DC
  Administered 2015-12-08 – 2015-12-14 (×7): 60 mg via ORAL
  Filled 2015-12-07 (×7): qty 3

## 2015-12-07 MED ORDER — BUPIVACAINE-EPINEPHRINE (PF) 0.25% -1:200000 IJ SOLN
INTRAMUSCULAR | Status: AC
  Filled 2015-12-07: qty 30

## 2015-12-07 MED ORDER — ONDANSETRON HCL 4 MG PO TABS
4.0000 mg | ORAL_TABLET | Freq: Three times a day (TID) | ORAL | Status: DC | PRN
Start: 2015-12-07 — End: 2015-12-07

## 2015-12-07 MED ORDER — LACTATED RINGERS IV SOLN
INTRAVENOUS | Status: DC | PRN
  Administered 2015-12-07 (×2): via INTRAVENOUS

## 2015-12-07 MED ORDER — MIDAZOLAM HCL 2 MG/2ML IJ SOLN
INTRAMUSCULAR | Status: AC
  Filled 2015-12-07: qty 2

## 2015-12-07 MED ORDER — SUGAMMADEX SODIUM 200 MG/2ML IV SOLN
INTRAVENOUS | Status: DC | PRN
  Administered 2015-12-07: 200 mg via INTRAVENOUS

## 2015-12-07 MED ORDER — BUPIVACAINE 0.25 % ON-Q PUMP SINGLE CATH 300ML
300.0000 mL | INJECTION | Status: DC
  Administered 2015-12-07: 300 mL
  Filled 2015-12-07: qty 300

## 2015-12-07 MED ORDER — BUPIVACAINE 0.25 % ON-Q PUMP SINGLE CATH 300ML
300.0000 mL | INJECTION | Status: DC
Start: 2015-12-07 — End: 2015-12-07
  Administered 2015-12-07: 300 mL
  Filled 2015-12-07: qty 300

## 2015-12-07 MED ORDER — PHENYLEPHRINE HCL 10 MG/ML IJ SOLN
INTRAVENOUS | Status: DC | PRN
  Administered 2015-12-07: 10 ug/min via INTRAVENOUS

## 2015-12-07 MED ORDER — ARTIFICIAL TEARS OP OINT
TOPICAL_OINTMENT | OPHTHALMIC | Status: AC
Start: 2015-12-07 — End: 2015-12-07
  Filled 2015-12-07: qty 10.5

## 2015-12-07 MED ORDER — FENTANYL CITRATE (PF) 100 MCG/2ML IJ SOLN
INTRAMUSCULAR | Status: DC | PRN
  Administered 2015-12-07 (×6): 50 ug via INTRAVENOUS

## 2015-12-07 MED ORDER — CYCLOSPORINE 0.05 % OP EMUL
1.0000 [drp] | Freq: Two times a day (BID) | OPHTHALMIC | Status: DC
  Administered 2015-12-07 – 2015-12-14 (×14): 1 [drp] via OPHTHALMIC
  Filled 2015-12-07 (×14): qty 1

## 2015-12-07 MED ORDER — ROCURONIUM BROMIDE 10 MG/ML (PF) SYRINGE
PREFILLED_SYRINGE | INTRAVENOUS | Status: AC
  Filled 2015-12-07: qty 30

## 2015-12-07 MED ORDER — HYDROCODONE-ACETAMINOPHEN 10-325 MG PO TABS
1.0000 | ORAL_TABLET | Freq: Four times a day (QID) | ORAL | Status: DC | PRN
  Administered 2015-12-08 – 2015-12-09 (×2): 1 via ORAL
  Filled 2015-12-07 (×2): qty 1

## 2015-12-07 MED ORDER — HYDROMORPHONE HCL 1 MG/ML IJ SOLN
INTRAMUSCULAR | Status: AC
  Filled 2015-12-07: qty 1

## 2015-12-07 MED ORDER — NALOXONE HCL 0.4 MG/ML IJ SOLN
0.4000 mg | INTRAMUSCULAR | Status: DC | PRN
  Filled 2015-12-07: qty 1

## 2015-12-07 MED ORDER — AZATHIOPRINE 50 MG PO TABS
50.0000 mg | ORAL_TABLET | Freq: Every day | ORAL | Status: DC
  Administered 2015-12-08 – 2015-12-14 (×7): 50 mg via ORAL
  Filled 2015-12-07 (×7): qty 1

## 2015-12-07 MED ORDER — CIPROFLOXACIN IN D5W 400 MG/200ML IV SOLN
400.0000 mg | Freq: Two times a day (BID) | INTRAVENOUS | Status: AC
  Administered 2015-12-07: 400 mg via INTRAVENOUS
  Filled 2015-12-07: qty 200

## 2015-12-07 MED ORDER — PANTOPRAZOLE SODIUM 40 MG PO TBEC
40.0000 mg | DELAYED_RELEASE_TABLET | Freq: Every day | ORAL | Status: DC
  Administered 2015-12-08 – 2015-12-14 (×7): 40 mg via ORAL
  Filled 2015-12-07 (×7): qty 1

## 2015-12-07 MED ORDER — QUETIAPINE FUMARATE 100 MG PO TABS
200.0000 mg | ORAL_TABLET | Freq: Every day | ORAL | Status: DC
  Administered 2015-12-07 – 2015-12-13 (×7): 200 mg via ORAL
  Filled 2015-12-07: qty 4
  Filled 2015-12-07: qty 2
  Filled 2015-12-07: qty 4
  Filled 2015-12-07 (×2): qty 2
  Filled 2015-12-07: qty 4
  Filled 2015-12-07: qty 2
  Filled 2015-12-07: qty 4

## 2015-12-07 MED ORDER — EVICEL 5 ML EX KIT
PACK | CUTANEOUS | Status: AC
  Filled 2015-12-07: qty 1

## 2015-12-07 MED ORDER — HYDROMORPHONE HCL 1 MG/ML IJ SOLN
0.2500 mg | INTRAMUSCULAR | Status: DC | PRN
  Administered 2015-12-07 (×2): 0.5 mg via INTRAVENOUS

## 2015-12-07 MED ORDER — METHOCARBAMOL 500 MG PO TABS
500.0000 mg | ORAL_TABLET | Freq: Four times a day (QID) | ORAL | Status: DC | PRN
  Administered 2015-12-09 – 2015-12-14 (×14): 500 mg via ORAL
  Filled 2015-12-07 (×16): qty 1

## 2015-12-07 MED ORDER — ONDANSETRON HCL 4 MG/2ML IJ SOLN
4.0000 mg | Freq: Four times a day (QID) | INTRAMUSCULAR | Status: DC | PRN
  Administered 2015-12-07 – 2015-12-08 (×3): 4 mg via INTRAVENOUS
  Filled 2015-12-07: qty 2

## 2015-12-07 MED ORDER — ONDANSETRON 4 MG PO TBDP
4.0000 mg | ORAL_TABLET | Freq: Four times a day (QID) | ORAL | Status: DC | PRN
  Administered 2015-12-11: 4 mg via ORAL
  Filled 2015-12-07 (×2): qty 1

## 2015-12-07 MED ORDER — EVICEL 5 ML EX KIT
PACK | CUTANEOUS | Status: DC | PRN
  Administered 2015-12-07: 5 mL

## 2015-12-07 SURGICAL SUPPLY — 120 items
ADH SKN CLS LQ APL DERMABOND (GAUZE/BANDAGES/DRESSINGS) ×4
APPLIER CLIP 5 13 M/L LIGAMAX5 (MISCELLANEOUS) ×12
APR CLP MED LRG 5 ANG JAW (MISCELLANEOUS) ×6
BAG BILE T-TUBES STRL (MISCELLANEOUS) ×3 IMPLANT
BAG DRN 9.5 2 ADJ BELT ADPR (MISCELLANEOUS) ×2
BIOPATCH RED 1 DISK 7.0 (GAUZE/BANDAGES/DRESSINGS) ×2 IMPLANT
BIOPATCH RED 1IN DISK 7.0MM (GAUZE/BANDAGES/DRESSINGS) ×1
BLADE SURG ROTATE 9660 (MISCELLANEOUS) IMPLANT
CANISTER SUCTION 2500CC (MISCELLANEOUS) ×4 IMPLANT
CATH KIT ON Q 5IN DUAL SLV (PAIN MANAGEMENT) IMPLANT
CATH KIT ON Q 7.5IN SLV (PAIN MANAGEMENT) IMPLANT
CATH KIT ON-Q SILVERSOAK 7.5 (CATHETERS) IMPLANT
CATH KIT ON-Q SILVERSOAK 7.5IN (CATHETERS) ×8 IMPLANT
CATH ROBINSON RED A/P 14FR (CATHETERS) IMPLANT
CATH ROBINSON RED A/P 16FR (CATHETERS) IMPLANT
CHLORAPREP W/TINT 26ML (MISCELLANEOUS) ×4 IMPLANT
CLIP APPLIE 5 13 M/L LIGAMAX5 (MISCELLANEOUS) ×4 IMPLANT
CLIP LIGATING HEM O LOK PURPLE (MISCELLANEOUS) IMPLANT
CLIP LIGATING HEMO O LOK GREEN (MISCELLANEOUS) IMPLANT
CLIP LIGATING HEMOLOK MED (MISCELLANEOUS) IMPLANT
CLIP TI LARGE 6 (CLIP) IMPLANT
CLIP TI MEDIUM 24 (CLIP) IMPLANT
CLIP TI WIDE RED SMALL 24 (CLIP) IMPLANT
COVER SURGICAL LIGHT HANDLE (MISCELLANEOUS) ×4 IMPLANT
DERMABOND ADHESIVE PROPEN (GAUZE/BANDAGES/DRESSINGS) ×4
DERMABOND ADVANCED .7 DNX6 (GAUZE/BANDAGES/DRESSINGS) ×2 IMPLANT
DRAIN CHANNEL 19F RND (DRAIN) ×3 IMPLANT
DRAIN PENROSE 1/2X36 STERILE (WOUND CARE) IMPLANT
DRAPE LAPAROSCOPIC ABDOMINAL (DRAPES) ×4 IMPLANT
DRAPE UTILITY XL STRL (DRAPES) ×8 IMPLANT
DRAPE WARM FLUID 44X44 (DRAPE) ×4 IMPLANT
DRSG COVADERM 4X10 (GAUZE/BANDAGES/DRESSINGS) IMPLANT
DRSG COVADERM 4X14 (GAUZE/BANDAGES/DRESSINGS) IMPLANT
DRSG TEGADERM 4X4.75 (GAUZE/BANDAGES/DRESSINGS) ×3 IMPLANT
ELECT BLADE 6.5 EXT (BLADE) ×3 IMPLANT
ELECT CAUTERY BLADE 6.4 (BLADE) ×4 IMPLANT
ELECT REM PT RETURN 9FT ADLT (ELECTROSURGICAL) ×4
ELECTRODE REM PT RTRN 9FT ADLT (ELECTROSURGICAL) ×2 IMPLANT
EVACUATOR SILICONE 100CC (DRAIN) IMPLANT
GAUZE SPONGE 4X4 12PLY STRL (GAUZE/BANDAGES/DRESSINGS) ×8 IMPLANT
GEL PDS (MISCELLANEOUS) ×3 IMPLANT
GLOVE BIO SURGEON STRL SZ 6 (GLOVE) ×7 IMPLANT
GLOVE BIO SURGEON STRL SZ7 (GLOVE) ×6 IMPLANT
GLOVE BIOGEL PI IND STRL 6.5 (GLOVE) ×4 IMPLANT
GLOVE BIOGEL PI IND STRL 7.0 (GLOVE) ×4 IMPLANT
GLOVE BIOGEL PI INDICATOR 6.5 (GLOVE) ×6
GLOVE BIOGEL PI INDICATOR 7.0 (GLOVE) ×8
GLOVE EUDERMIC 7 POWDERFREE (GLOVE) ×3 IMPLANT
GLOVE INDICATOR 6.5 STRL GRN (GLOVE) ×3 IMPLANT
GOWN STRL REUS W/ TWL LRG LVL3 (GOWN DISPOSABLE) ×4 IMPLANT
GOWN STRL REUS W/ TWL XL LVL3 (GOWN DISPOSABLE) ×1 IMPLANT
GOWN STRL REUS W/TWL 2XL LVL3 (GOWN DISPOSABLE) ×8 IMPLANT
GOWN STRL REUS W/TWL LRG LVL3 (GOWN DISPOSABLE) ×8
GOWN STRL REUS W/TWL XL LVL3 (GOWN DISPOSABLE) ×4
HEMOSTAT SURGICEL 2X14 (HEMOSTASIS) IMPLANT
KIT BASIN OR (CUSTOM PROCEDURE TRAY) ×4 IMPLANT
KIT ROOM TURNOVER OR (KITS) ×4 IMPLANT
L-HOOK LAP DISP 36CM (ELECTROSURGICAL) ×4
LHOOK LAP DISP 36CM (ELECTROSURGICAL) ×2 IMPLANT
NS IRRIG 1000ML POUR BTL (IV SOLUTION) ×8 IMPLANT
PACK GENERAL/GYN (CUSTOM PROCEDURE TRAY) ×4 IMPLANT
PACK LAPAROSCOPIC ABD 0248 (SET/KITS/TRAYS/PACK) ×4 IMPLANT
PAD ARMBOARD 7.5X6 YLW CONV (MISCELLANEOUS) ×11 IMPLANT
PENCIL BUTTON HOLSTER BLD 10FT (ELECTRODE) ×5 IMPLANT
RELOAD 45 VASCULAR/THIN (ENDOMECHANICALS) IMPLANT
RELOAD STAPLE 45 2.5 WHT GRN (ENDOMECHANICALS) IMPLANT
RELOAD STAPLE 60 2.6 WHT THN (STAPLE) IMPLANT
RELOAD STAPLE 60 3.6 BLU REG (STAPLE) IMPLANT
RELOAD STAPLER BLUE 60MM (STAPLE) ×2 IMPLANT
RELOAD STAPLER WHITE 60MM (STAPLE) ×10 IMPLANT
RELOAD WHITE ECR60W (STAPLE) ×3 IMPLANT
SCALPEL HARMONIC ACE (MISCELLANEOUS) ×4 IMPLANT
SCISSORS HARMONIC WAVE 18CM (INSTRUMENTS) ×3 IMPLANT
SCISSORS LAP 5X35 DISP (ENDOMECHANICALS) ×3 IMPLANT
SET IRRIG TUBING LAPAROSCOPIC (IRRIGATION / IRRIGATOR) ×4 IMPLANT
SLEEVE ENDOPATH XCEL 5M (ENDOMECHANICALS) ×17 IMPLANT
SLEEVE SURGEON STRL (DRAPES) ×4 IMPLANT
SPECIMEN JAR X LARGE (MISCELLANEOUS) ×4 IMPLANT
SPONGE INTESTINAL PEANUT (DISPOSABLE) IMPLANT
SPONGE LAP 18X18 X RAY DECT (DISPOSABLE) IMPLANT
STAPLE ECHEON FLEX 60 POW ENDO (STAPLE) ×7 IMPLANT
STAPLER RELOAD BLUE 60MM (STAPLE) ×4
STAPLER RELOAD WHITE 60MM (STAPLE) ×20
STAPLER VISISTAT 35W (STAPLE) ×4 IMPLANT
STRIP PERI DRY VERITAS 60 (STAPLE) ×3 IMPLANT
SUCTION POOLE TIP (SUCTIONS) IMPLANT
SUT ETHILON 2 0 FS 18 (SUTURE) ×3 IMPLANT
SUT MNCRL AB 4-0 PS2 18 (SUTURE) ×6 IMPLANT
SUT NOVA 1 T20/GS 25DT (SUTURE) IMPLANT
SUT PDS AB 1 TP1 96 (SUTURE) IMPLANT
SUT PDS AB 3-0 SH 27 (SUTURE) IMPLANT
SUT PDS II 0 TP-1 LOOPED 60 (SUTURE) ×14 IMPLANT
SUT PROLENE 3 0 SH 48 (SUTURE) IMPLANT
SUT PROLENE 4 0 RB 1 (SUTURE)
SUT PROLENE 4 0 SH DA (SUTURE) IMPLANT
SUT PROLENE 4-0 RB1 .5 CRCL 36 (SUTURE) IMPLANT
SUT PROLENE 5 0 C1 (SUTURE) IMPLANT
SUT SILK 2 0 REEL (SUTURE) IMPLANT
SUT SILK 2 0 SH CR/8 (SUTURE) ×8 IMPLANT
SUT SILK 2 0 TIES 10X30 (SUTURE) ×4 IMPLANT
SUT SILK 3 0 SH CR/8 (SUTURE) ×4 IMPLANT
SUT SILK 3 0 TIES 10X30 (SUTURE) ×4 IMPLANT
SYS LAPSCP GELPORT 120MM (MISCELLANEOUS) ×4
SYSTEM LAPSCP GELPORT 120MM (MISCELLANEOUS) ×1 IMPLANT
TIP RIGID 35CM EVICEL (HEMOSTASIS) ×7 IMPLANT
TOWEL OR 17X24 6PK STRL BLUE (TOWEL DISPOSABLE) ×4 IMPLANT
TOWEL OR 17X26 10 PK STRL BLUE (TOWEL DISPOSABLE) ×4 IMPLANT
TRAY FOLEY CATH 14FRSI W/METER (CATHETERS) ×4 IMPLANT
TROCAR BLADELESS 12MM (ENDOMECHANICALS) ×3 IMPLANT
TROCAR XCEL 12X100 BLDLESS (ENDOMECHANICALS) ×4 IMPLANT
TROCAR XCEL BLUNT TIP 100MML (ENDOMECHANICALS) IMPLANT
TROCAR XCEL NON-BLD 5MMX100MML (ENDOMECHANICALS) ×4 IMPLANT
TUBE CONNECTING 12'X1/4 (SUCTIONS) ×1
TUBE CONNECTING 12X1/4 (SUCTIONS) ×3 IMPLANT
TUBING FILTER THERMOFLATOR (ELECTROSURGICAL) ×4 IMPLANT
TUBING INSUFFLATION (TUBING) ×4 IMPLANT
TUNNELER SHEATH ON-Q 11GX8 DSP (PAIN MANAGEMENT) ×7 IMPLANT
TUNNELER SHEATH ON-Q 16GX12 DP (PAIN MANAGEMENT) ×3 IMPLANT
WATER STERILE IRR 1000ML POUR (IV SOLUTION) ×4 IMPLANT
YANKAUER SUCT BULB TIP NO VENT (SUCTIONS) ×4 IMPLANT

## 2015-12-07 NOTE — Progress Notes (Signed)
@  2115 paged Dr. Brantley Stage regarding pt's CBGs >200. Sliding scale orders received. Will continue to monitor.

## 2015-12-07 NOTE — Anesthesia Postprocedure Evaluation (Signed)
Anesthesia Post Note  Patient: Morgan Cooper  Procedure(s) Performed: Procedure(s) (LRB): HAND ASSISTED LAPAROSCOPIC DISTAL PANCREATECTOMY (N/A)  Patient location during evaluation: PACU Anesthesia Type: General Level of consciousness: awake and alert Pain management: pain level controlled Vital Signs Assessment: post-procedure vital signs reviewed and stable Respiratory status: spontaneous breathing, nonlabored ventilation, respiratory function stable and patient connected to nasal cannula oxygen Cardiovascular status: blood pressure returned to baseline and stable Postop Assessment: no signs of nausea or vomiting Anesthetic complications: no    Last Vitals:  Vitals:   12/07/15 0555 12/07/15 1045  BP: (!) 157/65 100/78  Pulse: (!) 52 61  Resp: 20 14  Temp: 36.4 C (!) 36 C    Last Pain:  Vitals:   12/07/15 0605  TempSrc:   PainSc: 4                  Zenaida Deed

## 2015-12-07 NOTE — Progress Notes (Signed)
Pt HR 50-55. Had Lopressor last night at 23:00. Holding AM dose

## 2015-12-07 NOTE — Transfer of Care (Signed)
Immediate Anesthesia Transfer of Care Note  Patient: Morgan Cooper  Procedure(s) Performed: Procedure(s): HAND ASSISTED LAPAROSCOPIC DISTAL PANCREATECTOMY (N/A)  Patient Location: PACU  Anesthesia Type:General  Level of Consciousness: sedated  Airway & Oxygen Therapy: Patient Spontanous Breathing and Patient connected to face mask oxygen  Post-op Assessment: Report given to RN and Post -op Vital signs reviewed and stable  Post vital signs: Reviewed and stable  Last Vitals:  Vitals:   12/07/15 0555  BP: (!) 157/65  Pulse: (!) 52  Resp: 20  Temp: 36.4 C    Last Pain:  Vitals:   12/07/15 0605  TempSrc:   PainSc: 4          Complications: No apparent anesthesia complications

## 2015-12-07 NOTE — H&P (View-Only) (Signed)
Morgan Cooper  Location: [facility] Patient #: [id number] DOB: [birth year] Widowed / Language: Cleophus Molt / Race: White Female   History of Present Illness The patient is a 70 year old female who presents with a pancreatic mass. Patient is a 70 year old female referred by Dr. Silverio Decamp for a enlarging cystic pancreatic lesion. 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 She also has a history of Barrett's esophagus and renal transplant. Her kidney transplant was 40 years ago and has been working quite well. She is maintained on Imuran and prednisone. She has a history of heart failure in her diagnosis list, but is not on any heart failure medications like carvedilol or ACE inhibitor. Her last echo was in December 2016 showing grade 2 diastolic dysfunction. She has started having some weight loss and left upper quadrant/back pain. She has no appetite. She had a known pancreatic lesion going back to 2008. The mass has been progressively enlarging. She denies jaundice. She denies history of diabetes.    MRI 6/26 FINDINGS: Lower chest: Grossly stable without acute findings.  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 No focal hepatic abnormalities are seen. No evidence of gallstones, gallbladder wall thickening or biliary dilatation.  Pancreas: Again demonstrated is a large multi-septated pancreatic mass arising from the distal pancreatic body and tail. This currently measures 9.5 x 6.6 x 7.0 cm. On the most recent CT, the lesion measured 8.5 x 5.7 x 6.5 cm. This lesion measured approximately 5.8 x 3.9 cm in 2008. There is enhancement of multiple septations within this lesion but no significant solid components. As before, the lesion is associated with the splenic vein which  is posteriorly displaced. It may communicate with the main pancreatic duct which is not dilated.  Spleen: Normal in size without focal abnormality.  Adrenals/Urinary Tract: Both adrenal glands appear normal. The native kidneys are markedly atrophic. There are cystic lesions in both mid kidneys without suspicious enhancement. The renal transplant in the left iliac fossa is partially imaged on the coronal images, grossly stable.  Stomach/Bowel: No evidence of bowel wall thickening, distention or surrounding inflammatory change.  Vascular/Lymphatic: There are no enlarged abdominal lymph nodes. There is extensive aortoiliac atherosclerosis. The aorta is diffusely ectatic, measuring up to 3.9 cm transverse, similar to recent prior studies.  Other: Ventral hernia is incompletely visualized, although grossly stable. There is no ascites.  Musculoskeletal: No acute or significant osseous findings.  IMPRESSION: 1. Continued slow growth of multi-septated cystic pancreatic lesion (present since at least 2003 based on prior reports). 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 2. No evidence pancreatic or biliary ductal dilatation. 3. Grossly stable appearance of the native kidneys with marked cortical thinning and cyst formation bilaterally. 4. Aortic atherosclerosis and infrarenal aneurysm, grossly stable   Other Problems  Anxiety Disorder Bladder Problems Chest pain Chronic Obstructive Lung Disease Chronic Renal Failure Syndrome Congestive Heart Failure Depression Diverticulosis Gastroesophageal Reflux Disease Heart murmur High blood pressure Home Oxygen Use Hypercholesterolemia Thyroid Disease  Past Surgical History Appendectomy Cataract Surgery Bilateral. Dialysis Shunt / Fistula Thyroid Surgery  Diagnostic Studies History   Mammogram within last year Pap Smear >5 years ago  Allergies Amoxicillin *PENICILLINS*  Medication History LORazepam (1MG  Tablet, Oral as needed) Active. FLUoxetine HCl (20MG  Capsule,  Oral) Active. Furosemide (20MG  Tablet, Oral) Active. Metoprolol Tartrate (50MG  Tablet, Oral) Active. Omeprazole (40MG  Capsule DR, Oral) Active. Restasis (0.05% Emulsion, Ophthalmic) Active. Atorvastatin Calcium (20MG  Tablet, Oral) Active. AzaTHIOprine (50MG  Tablet, Oral) Active. Medications Reconciled  Pregnancy / Birth History Age at menarche 75 years. Age of menopause 45-50 Gravida 2 Maternal age 71-20    Review of Systems General Present- Appetite Loss, Chills, Fatigue, Night Sweats and Weight Loss. Not Present- Fever and Weight Gain. HEENT Present- Visual Disturbances. Not Present- Earache, Hearing Loss, Hoarseness, Nose Bleed, Oral Ulcers, Ringing in the Ears, Seasonal Allergies, Sinus Pain, Sore Throat, Wears glasses/contact lenses and Yellow Eyes. Respiratory Present- Difficulty Breathing. Not Present- Bloody sputum, Chronic Cough, Snoring and Wheezing. Breast Not Present- Breast Mass, Breast Pain, Nipple Discharge and Skin Changes. Cardiovascular Present- Difficulty Breathing Lying Down, Palpitations, Rapid Heart Rate and Shortness of Breath. Not Present- Chest Pain, Leg Cramps and Swelling of Extremities. Gastrointestinal Present- Abdominal Pain, Bloating, Difficulty Swallowing, Gets full quickly at meals, Indigestion and Nausea. Not Present- Bloody Stool, Change in Bowel Habits, Chronic diarrhea, Constipation, Excessive gas, Hemorrhoids, Rectal Pain and Vomiting. Female Genitourinary Present- Pelvic Pain and Urgency. Not Present- Frequency, Nocturia and Painful Urination. Musculoskeletal Present- Back Pain, Joint Stiffness, Muscle Pain, Muscle Weakness and Swelling of Extremities. Not Present- Joint Pain. Neurological Present- Decreased Memory, Headaches, Numbness, Tingling  and Weakness. Not Present- Fainting, Seizures, Tremor and Trouble walking. Psychiatric Present- Depression. Not Present- Anxiety, Bipolar, Change in Sleep Pattern, Fearful and Frequent crying. Endocrine Present- Cold Intolerance and Heat Intolerance. Not Present- Excessive Hunger, Hair Changes, Hot flashes and New Diabetes. Hematology Present- Easy Bruising. Not Present- Blood Thinners, Excessive bleeding, Gland problems, HIV and Persistent Infections.  Vitals Weight: 119 lb Height: 65in Body Surface Area: 1.59 m Body Mass Index: 19.8 kg/m  Temp.: 71F(Temporal)  Pulse: 79 (Regular)  BP: 126/80 (Sitting, Left Arm, Standard)       Physical Exam  General Mental Status-Alert. General Appearance-Consistent with stated age. Hydration-Well hydrated. Voice-Normal.  Head and Neck Head-normocephalic, atraumatic with no lesions or palpable masses. Trachea-midline. Thyroid Gland Characteristics - normal size and consistency.  Eye Eyeball - Bilateral-Extraocular movements intact. Sclera/Conjunctiva - Bilateral-No scleral icterus.  Chest and Lung Exam Chest and lung exam reveals -quiet, even and easy respiratory effort with no use of accessory muscles and on auscultation, normal breath sounds, no adventitious sounds and normal vocal resonance. Inspection Chest Wall - Normal. Back - normal.  Cardiovascular Cardiovascular examination reveals -normal heart sounds, regular rate and rhythm with no murmurs and normal pedal pulses bilaterally.  Abdomen Inspection Inspection of the abdomen reveals - No Hernias. Palpation/Percussion Palpation and Percussion of the abdomen reveal - Soft, Non Tender, No Rebound tenderness, No Rigidity (guarding) and No hepatosplenomegaly. Auscultation Auscultation of the abdomen reveals - Bowel sounds normal. Note: fullness in the LUQ   Neurologic Neurologic evaluation reveals -alert and oriented x 3 with no  impairment of recent or remote memory. Mental Status-Normal.  Musculoskeletal Global Assessment -Note: no gross deformities.  Normal Exam - Left-Upper Extremity Strength Normal and Lower Extremity Strength Normal. Normal Exam - Right-Upper Extremity Strength Normal and Lower Extremity Strength Normal.  Lymphatic Head & Neck  General Head & Neck Lymphatics: Bilateral - Description - Normal. Axillary  General Axillary Region: Bilateral - Description - Normal. Tenderness - Non Tender. Femoral & Inguinal  Generalized Femoral & Inguinal Lymphatics: Bilateral - Description - No Generalized lymphadenopathy.    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 Unfortunately, it has been enlarging and is starting to cause her symptoms of early satiety and weight loss. She is of moderate health. I think that she will outlive this mass and that it will continue to grow and enlarge. It is not, become easier to take out. At this size is now, I think we would be able to do a hand-assisted laparoscopic approach. However if it were to get much larger, we may need to do a open distal pancreatectomy. I discussed with the patient that unless her pancreas separated very easily from the splenic vessels, I would plan to take the spleen. Given her kidney that has been working so well for 40 years, I would want to minimize her time under anesthesia and her risk of blood loss.  I reviewed the risk of pancreatic leak, hernia, bleeding, infection, damage to adjacent structures, heart or lung complications, possible death. I discussed that this is not a surgery that she absolutely has to have a she is super uncomfortable or scared. I think that based on the risks of that continuing to grow compared to the risks of her current medical problems, I think that she is only going to become more  symptomatic from the mass and more and healthy for her medical problems. I discussed that she certainly is at risk for complications and that we would need to have her go home with family member after surgery.  45 min spent in evaluation, examination, counseling, and coordination of care. >50% spent in counseling. Current Plans You are being scheduled for surgery - Our schedulers will call you.  You should hear from our office's scheduling department within 5 working days about the location, date, and time of surgery. We try to make accommodations for patient's preferences in scheduling surgery, but sometimes the OR schedule or the surgeon's schedule prevents [place] from making those accommodations.  If you have not heard from our office [phone]) in 5 working days, call the office and ask for your surgeon's nurse.  If you have other questions about your diagnosis, plan, or surgery, call the office and ask for your surgeon's nurse.  Pt Education - FB pancreatectomy   Signed by Stark Klein, MD

## 2015-12-07 NOTE — Progress Notes (Signed)
Pt continues to have nausea after zofran, paged Dr Barry Dienes. Consuelo Pandy RN

## 2015-12-07 NOTE — Anesthesia Procedure Notes (Signed)
Procedure Name: Intubation Date/Time: 12/07/2015 7:48 AM Performed by: Maude Leriche D Pre-anesthesia Checklist: Patient identified, Emergency Drugs available, Suction available, Patient being monitored and Timeout performed Patient Re-evaluated:Patient Re-evaluated prior to inductionOxygen Delivery Method: Circle system utilized Preoxygenation: Pre-oxygenation with 100% oxygen Intubation Type: IV induction Ventilation: Mask ventilation without difficulty Laryngoscope Size: Miller and 2 Grade View: Grade I Tube type: Oral Tube size: 7.5 mm Number of attempts: 1 Airway Equipment and Method: Stylet Placement Confirmation: ETT inserted through vocal cords under direct vision,  positive ETCO2 and breath sounds checked- equal and bilateral Secured at: 21 cm Tube secured with: Tape Dental Injury: Teeth and Oropharynx as per pre-operative assessment

## 2015-12-07 NOTE — Interval H&P Note (Signed)
History and Physical Interval Note:  12/07/2015 7:26 AM  Morgan Cooper  has presented today for surgery, with the diagnosis of DISTAL PANCREATIC MASS  The various methods of treatment have been discussed with the patient and family. After consideration of risks, benefits and other options for treatment, the patient has consented to  Procedure(s): HAND ASSISTED LAPAROSCOPIC DISTAL PANCREATECTOMY (N/A) POSSIBLE SPLENECTOMY (N/A) as a surgical intervention .  The patient's history has been reviewed, patient examined, no change in status, stable for surgery.  I have reviewed the patient's chart and labs.  Questions were answered to the patient's satisfaction.     Jeevan Kalla

## 2015-12-07 NOTE — Anesthesia Preprocedure Evaluation (Addendum)
Anesthesia Evaluation  Patient identified by MRN, date of birth, ID band Patient awake    Reviewed: Allergy & Precautions, H&P , NPO status , Patient's Chart, lab work & pertinent test results  History of Anesthesia Complications Negative for: history of anesthetic complications  Airway Mallampati: II  TM Distance: >3 FB Neck ROM: full    Dental no notable dental hx. (+) Teeth Intact, Caps, Dental Advisory Given   Pulmonary sleep apnea , COPD,  oxygen dependent, former smoker,  Dx of interstitial lung disease, on 2L O2 at night   Pulmonary exam normal breath sounds clear to auscultation       Cardiovascular hypertension, + Peripheral Vascular Disease and +CHF  Normal cardiovascular exam Rhythm:regular Rate:Normal     Neuro/Psych Anxiety Depression TIA   GI/Hepatic Neg liver ROS, hiatal hernia, GERD  ,Hx of Barretts esophagus   Endo/Other  negative endocrine ROS  Renal/GU Renal diseaseHx of renal tranpslant in early 80s     Musculoskeletal  (+) Arthritis ,   Abdominal   Peds  Hematology negative hematology ROS (+)   Anesthesia Other Findings   Reproductive/Obstetrics negative OB ROS                            Anesthesia Physical Anesthesia Plan  ASA: III  Anesthesia Plan: General   Post-op Pain Management:    Induction: Intravenous  Airway Management Planned: Oral ETT  Additional Equipment: Arterial line  Intra-op Plan:   Post-operative Plan: Possible Post-op intubation/ventilation  Informed Consent: I have reviewed the patients History and Physical, chart, labs and discussed the procedure including the risks, benefits and alternatives for the proposed anesthesia with the patient or authorized representative who has indicated his/her understanding and acceptance.   Dental Advisory Given  Plan Discussed with: Anesthesiologist, CRNA and Surgeon  Anesthesia Plan Comments:  (Have hydrocortisone available for stress dose given prednisone maintenance, may need post op ventilatory support or CPAP given pulmonary status, will place A line and support BP with NE given elevated pulmonary pressures but RV function is great, 2 good PIV should be okay)        Anesthesia Quick Evaluation

## 2015-12-07 NOTE — Op Note (Signed)
PREOPERATIVE DIAGNOSIS:  Complex pancreatic mass      POSTOPERATIVE DIAGNOSIS:  Same      PROCEDURE:  Laparoscopic hand-assisted distal pancreatectomy      SURGEON:  Stark Klein, MD      ASSISTANT:  Fanny Skates, M.D.      ANESTHESIA:  General and local.      FINDINGS:  Large mass in the distal pancreas      SPECIMEN:  Distal pancreas and spleen to Pathology.      ESTIMATED BLOOD LOSS:  [id number] mL      COMPLICATIONS:  None known.      PROCEDURE:  Patient was identified in the holding area and taken to   operating room where she was placed supine on the operating room table.   General anesthesia was induced.  Foley catheter was placed.  She was   placed in the leaning spleen position.  Her abdomen was prepped and   draped in sterile fashion.  Time-out was performed according to surgical   safety check list.  When all was correct, we continued.     An upper midline incision was made in the abdomen.  The cautery was used to divide the subcutaneous tissues and the fascia.  The hand port was placed in the upper midline.  A 5 mm port was placed through the hand port.  Pneumoperitoneum was achieved.       Three additional 5 mm trocars were placed in the left abdomen.   The patient had some adhesions in the abdominal wall.  They were taken down with the Harmonic.  The lienocolic ligament was   taken down with the Harmonic.  The lesser sac was opened with the   Harmonic and all the anterior adhesions were taken down. The mass was friable and bloody.   Once the   stomach was completely off the spleen, the inferior border of the   pancreas was identified and this was opened up with the Harmonic.    The pancreas and the splenic   artery and vein were elevated.  The 12 mm port was placed through the hand port.   The pancreas was then isolated off  the vessels and the pancreas was stapled with 1 blue load of the echelon stapler with peristrips.  The splenic artery was then stapled with a  vascular load.  The splenic vein was isolated with the stapler and then fell apart.  Fortunately, there was a cuff on it and it was able to be grabbed and stapled.   The posterior attachments were then taken   off with the Harmonic scalpel.  The distal pancreas went around 5 cm beyond the mass.  This was dissected out with the harmonic. The splenic vein was divided a second time laterally with the stapler.  There were a few additional attachments holding the pancreas in place.  These were divided.   The specimen was then removed from the hand   port.  The area was inspected for hemostasis.  The spleen appeared to have reasonable perfusion still from the short gastrics.  This was left in place.     The abdomen was copiously irrigated and there was   no sign of any additional bleeding.  Evicel was placed over the pancreatic stump.  The pancreatic specimen was examined and the duct of the   pancreas was seen to be in the staple line.  This was   airtight.  The 19 Blake drain was then passed into  the abdomen through   the hand port and pulled out through one of the other 5 mm trocars.   This was placed in the appropriate location.  The OnQ catheters were placed on either side of the midline incision.  At this point, the other 5  mm trocar was removed and the fascia from the hand port was closed with  0-0 looped PDS suture.  The skin of all the incisions was   then closed with 4-0 Monocryl in a subcuticular fashion and then dressed   with benzoin, Steri-Strips, and soft dressings.  The patient tolerated   the procedure well.  She was extubated and taken to the PACU in stable   condition.  Needle, sponge, and instrument counts were correct x2               Stark Klein, MD

## 2015-12-08 ENCOUNTER — Encounter (HOSPITAL_COMMUNITY): Payer: Self-pay | Admitting: General Surgery

## 2015-12-08 LAB — GLUCOSE, CAPILLARY
GLUCOSE-CAPILLARY: 151 mg/dL — AB (ref 65–99)
Glucose-Capillary: 140 mg/dL — ABNORMAL HIGH (ref 65–99)
Glucose-Capillary: 125 mg/dL — ABNORMAL HIGH (ref 65–99)
Glucose-Capillary: 186 mg/dL — ABNORMAL HIGH (ref 65–99)
Glucose-Capillary: 142 mg/dL — ABNORMAL HIGH (ref 65–99)
Glucose-Capillary: 155 mg/dL — ABNORMAL HIGH (ref 65–99)

## 2015-12-08 LAB — BASIC METABOLIC PANEL
Anion gap: 4 — ABNORMAL LOW (ref 5–15)
BUN: 5 mg/dL — ABNORMAL LOW (ref 6–20)
CALCIUM: 8.6 mg/dL — AB (ref 8.9–10.3)
CHLORIDE: 107 mmol/L (ref 101–111)
CO2: 23 mmol/L (ref 22–32)
CREATININE: 0.62 mg/dL (ref 0.44–1.00)
Glucose, Bld: 147 mg/dL — ABNORMAL HIGH (ref 65–99)
Potassium: 4 mmol/L (ref 3.5–5.1)
SODIUM: 134 mmol/L — AB (ref 135–145)

## 2015-12-08 LAB — CBC
HCT: 27.9 % — ABNORMAL LOW (ref 36.0–46.0)
HEMOGLOBIN: 9.5 g/dL — AB (ref 12.0–15.0)
MCH: 32.1 pg (ref 26.0–34.0)
MCHC: 34.1 g/dL (ref 30.0–36.0)
MCV: 94.3 fL (ref 78.0–100.0)
Platelets: 229 10*3/uL (ref 150–400)
RBC: 2.96 MIL/uL — ABNORMAL LOW (ref 3.87–5.11)
RDW: 13.5 % (ref 11.5–15.5)
WBC: 10.9 10*3/uL — ABNORMAL HIGH (ref 4.0–10.5)

## 2015-12-08 LAB — HEMOGLOBIN A1C
HEMOGLOBIN A1C: 5.7 % — AB (ref 4.8–5.6)
MEAN PLASMA GLUCOSE: 117 mg/dL

## 2015-12-08 MED ORDER — DIPHENHYDRAMINE HCL 50 MG/ML IJ SOLN: 12.5000 mg | Freq: Four times a day (QID) | INTRAMUSCULAR | Status: DC | PRN

## 2015-12-08 MED ORDER — INSULIN ASPART 100 UNIT/ML ~~LOC~~ SOLN
0.0000 [IU] | Freq: Three times a day (TID) | SUBCUTANEOUS | Status: DC
  Administered 2015-12-08 (×2): 2 [IU] via SUBCUTANEOUS
  Administered 2015-12-09: 1 [IU] via SUBCUTANEOUS
  Administered 2015-12-09 – 2015-12-11 (×5): 2 [IU] via SUBCUTANEOUS
  Administered 2015-12-11: 1 [IU] via SUBCUTANEOUS
  Administered 2015-12-12: 2 [IU] via SUBCUTANEOUS
  Administered 2015-12-12 – 2015-12-13 (×2): 1 [IU] via SUBCUTANEOUS

## 2015-12-08 MED ORDER — DIPHENHYDRAMINE HCL 12.5 MG/5ML PO ELIX: 12.5000 mg | ORAL_SOLUTION | Freq: Four times a day (QID) | ORAL | Status: DC | PRN

## 2015-12-08 MED ORDER — NALOXONE HCL 0.4 MG/ML IJ SOLN: 0.4000 mg | INTRAMUSCULAR | Status: DC | PRN

## 2015-12-08 MED ORDER — METOPROLOL TARTRATE 50 MG PO TABS
50.0000 mg | ORAL_TABLET | Freq: Two times a day (BID) | ORAL | Status: DC
  Administered 2015-12-08 – 2015-12-14 (×12): 50 mg via ORAL
  Filled 2015-12-08 (×12): qty 1

## 2015-12-08 MED ORDER — MORPHINE SULFATE (PF) 2 MG/ML IV SOLN: 1.0000 mg | INTRAVENOUS | Status: DC | PRN

## 2015-12-08 MED ORDER — PROCHLORPERAZINE EDISYLATE 5 MG/ML IJ SOLN
10.0000 mg | Freq: Four times a day (QID) | INTRAMUSCULAR | Status: DC | PRN
  Filled 2015-12-08: qty 2

## 2015-12-08 MED ORDER — SODIUM CHLORIDE 0.9% FLUSH: 9.0000 mL | INTRAVENOUS | Status: DC | PRN

## 2015-12-08 MED ORDER — MORPHINE SULFATE 2 MG/ML IV SOLN
INTRAVENOUS | Status: DC
  Administered 2015-12-08: 2 mg via INTRAVENOUS
  Administered 2015-12-08: 3 mg via INTRAVENOUS
  Administered 2015-12-08: 11:00:00 via INTRAVENOUS
  Administered 2015-12-08: 12 mg via INTRAVENOUS
  Administered 2015-12-09: 0 mg via INTRAVENOUS
  Filled 2015-12-08: qty 25

## 2015-12-08 MED ORDER — ONDANSETRON HCL 4 MG/2ML IJ SOLN: 4.0000 mg | Freq: Four times a day (QID) | INTRAMUSCULAR | Status: DC | PRN

## 2015-12-08 NOTE — Progress Notes (Signed)
Initial Nutrition Assessment  DOCUMENTATION CODES:   Not applicable  INTERVENTION:    Advance diet as medically appropriate, add supplements when/as able  NUTRITION DIAGNOSIS:   Inadequate oral intake related to inability to eat as evidenced by NPO status  GOAL:   Patient will meet greater than or equal to 90% of their needs  MONITOR:   Diet advancement, PO intake, Supplement acceptance, Labs, Weight trends, Skin, I & O's  REASON FOR ASSESSMENT:   Malnutrition Screening Tool  ASSESSMENT:   70 yo Female who presented with a pancreatic mass. Pt referred by Dr. Silverio Decamp for a enlarging cystic pancreatic lesion.  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  She also has a history of Barrett's esophagus and renal transplant.  Her kidney transplant was 40 years ago and has been working quite well.   Patient s/p procedure 10/17: LAPAROSCOPIC HAND-ASSISTED DISTAL PANCREATECTOMY  RD spoke with patient's daughter at bedside. Reports pt has had a decreased appetite for several months. On average pt would consume 2 meals per day; drinks Ensure Plus at home. Daughter also reports pt's weight has been fairly stable. RD informed daughter once pt's diet advanced, will order supplements.  Unable to complete Nutrition Focused Physical Exam at this time.  Diet Order:  Diet NPO time specified Except for: Ice Chips, Sips with Meds  Skin:  Reviewed, no issues  Last BM:  10/16   CBG (last 3)   Recent Labs  12/08/15 0346 12/08/15 0745 12/08/15 1128  GLUCAP 140* 125* 186*   Height:   Ht Readings from Last 1 Encounters:  12/07/15 5\' 5"  (1.651 m)    Weight:   Wt Readings from Last 1 Encounters:  12/07/15 129 lb 13.6 oz (58.9 kg)   Wt Readings from Last 15 Encounters:  12/07/15 129 lb 13.6 oz (58.9 kg)  11/30/15 122 lb 12.8 oz (55.7 kg)  08/11/15 121 lb (54.9 kg)  08/09/15 120 lb 8 oz (54.7 kg)   06/02/15 121 lb 6.4 oz (55.1 kg)  05/28/15 120 lb (54.4 kg)  05/21/15 120 lb (54.4 kg)  05/11/15 120 lb (54.4 kg)  04/26/15 124 lb (56.2 kg)  04/20/15 117 lb (53.1 kg)  03/20/15 115 lb (52.2 kg)  03/18/15 119 lb (54 kg)  03/12/15 117 lb 12.8 oz (53.4 kg)  02/24/15 117 lb 12.8 oz (53.4 kg)  02/02/15 120 lb (54.4 kg)   Ideal Body Weight:  56.8 kg  BMI:  Body mass index is 21.61 kg/m.  Estimated Nutritional Needs:   Kcal:  1500-1700  Protein:  70-80 gm  Fluid:  1.5-1.7 L  EDUCATION NEEDS:   No education needs identified at this time  Arthur Holms, RD, LDN Pager #: [phone] After-Hours Pager #: [phone]

## 2015-12-08 NOTE — Progress Notes (Signed)
1 Day Post-Op  Subjective: Having a fair amount of pain.  Also had nausea last night.  Zofran was not working for her.    Objective: Vital signs in last 24 hours: Temp:  [96.8 F (36 C)-98.6 F (37 C)] 98 F (36.7 C) (10/18 0700) Pulse Rate:  [50-75] 74 (10/18 0715) Resp:  [11-25] 25 (10/18 0715) BP: (92-175)/(47-85) 135/68 (10/18 0715) SpO2:  [96 %-100 %] 99 % (10/18 0715) Arterial Line BP: (99-164)/(48-79) 151/72 (10/18 0715) Weight:  [58.9 kg (129 lb 13.6 oz)] 58.9 kg (129 lb 13.6 oz) (10/17 1445) Last BM Date: 12/06/15  Intake/Output from previous day: 10/17 0701 - 10/18 0700 In: 3665 [P.O.:180; I.V.:3440] Out: 1900 [Urine:1150; Drains:500; Blood:250] Intake/Output this shift: Total I/O In: -  Out: 425 [Urine:425]  General appearance: alert and moderate distress Resp: breathing comfortably GI: soft, mildly distended, approp tender.  Lab Results:   Recent Labs  12/07/15 0607 12/07/15 0938 12/08/15 0526  WBC 5.7  --  10.9*  HGB 13.1 9.9* 9.5*  HCT 38.6 29.0* 27.9*  PLT 244  --  229   BMET  Recent Labs  12/07/15 0938 12/08/15 0526  NA 140 134*  K 4.3 4.0  CL  --  107  CO2  --  23  GLUCOSE 151* 147*  BUN  --  5*  CREATININE  --  0.62  CALCIUM  --  8.6*   PT/INR No results for input(s): LABPROT, INR in the last 72 hours. ABG No results for input(s): PHART, HCO3 in the last 72 hours.  Invalid input(s): PCO2, PO2  Studies/Results: No results found.  Anti-infectives: Anti-infectives    Start     Dose/Rate Route Frequency Ordered Stop   12/07/15 2200  trimethoprim (TRIMPEX) tablet 100 mg     100 mg Oral Daily at bedtime 12/07/15 1443     12/07/15 2000  ciprofloxacin (CIPRO) IVPB 400 mg     400 mg 200 mL/hr over 60 Minutes Intravenous Every 12 hours 12/07/15 1443 12/07/15 2104   12/07/15 0700  ciprofloxacin (CIPRO) IVPB 400 mg     400 mg 200 mL/hr over 60 Minutes Intravenous To ShortStay Surgical 12/06/15 1008 12/07/15 0753       Assessment/Plan: s/p Procedure(s): HAND ASSISTED LAPAROSCOPIC DISTAL PANCREATECTOMY (N/A) continue foley for urinary monitoring. Switch to morphine for pain control IS Had some volatile BPs.   Leave in stepdown.     LOS: 1 day    [facility] 12/08/2015

## 2015-12-09 LAB — CBC
HCT: 27.2 % — ABNORMAL LOW (ref 36.0–46.0)
HEMOGLOBIN: 9.2 g/dL — AB (ref 12.0–15.0)
MCH: 32.1 pg (ref 26.0–34.0)
MCHC: 33.8 g/dL (ref 30.0–36.0)
MCV: 94.8 fL (ref 78.0–100.0)
Platelets: 211 10*3/uL (ref 150–400)
RBC: 2.87 MIL/uL — ABNORMAL LOW (ref 3.87–5.11)
RDW: 13.4 % (ref 11.5–15.5)
WBC: 14.5 10*3/uL — ABNORMAL HIGH (ref 4.0–10.5)

## 2015-12-09 LAB — GLUCOSE, CAPILLARY
GLUCOSE-CAPILLARY: 152 mg/dL — AB (ref 65–99)
GLUCOSE-CAPILLARY: 132 mg/dL — AB (ref 65–99)
Glucose-Capillary: 128 mg/dL — ABNORMAL HIGH (ref 65–99)
Glucose-Capillary: 159 mg/dL — ABNORMAL HIGH (ref 65–99)

## 2015-12-09 LAB — BASIC METABOLIC PANEL
Anion gap: 6 (ref 5–15)
CHLORIDE: 99 mmol/L — AB (ref 101–111)
CO2: 25 mmol/L (ref 22–32)
CREATININE: 0.63 mg/dL (ref 0.44–1.00)
Calcium: 8.5 mg/dL — ABNORMAL LOW (ref 8.9–10.3)
GFR calc Af Amer: >60 mL/min (ref >60)
GFR calc non Af Amer: >60 mL/min (ref >60)
Glucose, Bld: 176 mg/dL — ABNORMAL HIGH (ref 65–99)
Potassium: 4.4 mmol/L (ref 3.5–5.1)
SODIUM: 130 mmol/L — AB (ref 135–145)

## 2015-12-09 MED ORDER — HYDROMORPHONE HCL 1 MG/ML IJ SOLN: 0.5000 mg | INTRAMUSCULAR | Status: DC | PRN

## 2015-12-09 MED ORDER — HYDROCODONE-ACETAMINOPHEN 10-325 MG PO TABS
0.5000 | ORAL_TABLET | Freq: Four times a day (QID) | ORAL | Status: DC | PRN
  Administered 2015-12-11 – 2015-12-14 (×11): 1 via ORAL
  Filled 2015-12-09 (×12): qty 1

## 2015-12-09 MED ORDER — SODIUM CHLORIDE 0.9 % IV SOLN
INTRAVENOUS | Status: DC
  Administered 2015-12-09 – 2015-12-11 (×3): via INTRAVENOUS

## 2015-12-09 MED ORDER — ENSURE ENLIVE PO LIQD
237.0000 mL | Freq: Two times a day (BID) | ORAL | Status: DC
  Administered 2015-12-09 – 2015-12-13 (×5): 237 mL via ORAL

## 2015-12-09 NOTE — Progress Notes (Signed)
Patient ID: EIKO CRETELLA, female   DOB: 1945-11-15, 70 y.o.   MRN: [id number]  Checked on patient earlier today and now. She is less drowsy than earlier today but still somewhat lethargic and tired. Her vital signs are stable and she is able to have a conversation, but falls asleep easily. Recommend keeping patient in 3S and consider transfer to unit tomorrow.  BROOKE A MILLER

## 2015-12-09 NOTE — Progress Notes (Signed)
GS Progress Note Subjective: Patient very sleepy this AM, but had just gotten PO vicodin.  Not eating much.  Some drainage from near empty PCA  Objective: Vital signs in last 24 hours: Temp:  [97.9 F (36.6 C)-100.2 F (37.9 C)] 98.8 F (37.1 C) (10/19 0455) Pulse Rate:  [72-81] 72 (10/19 0455) Resp:  [16-24] 19 (10/19 0455) BP: (107-154)/(51-86) 107/51 (10/19 0455) SpO2:  [90 %-98 %] 98 % (10/19 0455) Last BM Date: 12/06/15  Intake/Output from previous day: 10/18 0701 - 10/19 0700 In: 1200 [I.V.:1200] Out: 2520 [Urine:2400; Drains:120] Intake/Output this shift: No intake/output data recorded.  Lungs: Clear  Abd: Distended, excellent bowel sounds.  No bowel movement or gas  Extremities: No changes.  No clinical signs or symptomos of DVT  Neuro: Intact  Lab Results: CBC   Recent Labs  12/07/15 0607 12/07/15 0938 12/08/15 0526  WBC 5.7  --  10.9*  HGB 13.1 9.9* 9.5*  HCT 38.6 29.0* 27.9*  PLT 244  --  229   BMET  Recent Labs  12/08/15 0526 12/09/15 0404  NA 134* 130*  K 4.0 4.4  CL 107 99*  CO2 23 25  GLUCOSE 147* 176*  BUN 5* <5*  CREATININE 0.62 0.63  CALCIUM 8.6* 8.5*   PT/INR No results for input(s): LABPROT, INR in the last 72 hours. ABG No results for input(s): PHART, HCO3 in the last 72 hours.  Invalid input(s): PCO2, PO2  Studies/Results: No results found.  Anti-infectives: Anti-infectives    Start     Dose/Rate Route Frequency Ordered Stop   12/07/15 2200  trimethoprim (TRIMPEX) tablet 100 mg     100 mg Oral Daily at bedtime 12/07/15 1443     12/07/15 2000  ciprofloxacin (CIPRO) IVPB 400 mg     400 mg 200 mL/hr over 60 Minutes Intravenous Every 12 hours 12/07/15 1443 12/07/15 2104   12/07/15 0700  ciprofloxacin (CIPRO) IVPB 400 mg     400 mg 200 mL/hr over 60 Minutes Intravenous To ShortStay Surgical 12/06/15 1008 12/07/15 0753      Assessment/Plan: s/p Procedure(s): HAND ASSISTED LAPAROSCOPIC DISTAL PANCREATECTOMY Advance  diet Changed IV fluids for hyponatremia  Maybe transfer to 6N later today.  LOS: 2 days    Kathryne Eriksson. Dahlia Bailiff, MD, FACS [phone] [facility] 12/09/2015

## 2015-12-09 NOTE — Progress Notes (Signed)
Called by elink in notification of dropping oxygen saturations, as low as 80%, woke patient and oxygen saturations improved. Trauma PA called and notified. Paietns has slowly began to arouse more, will administer PRN narcan if patient become lethargic again

## 2015-12-10 LAB — GLUCOSE, CAPILLARY
GLUCOSE-CAPILLARY: 120 mg/dL — AB (ref 65–99)
GLUCOSE-CAPILLARY: 160 mg/dL — AB (ref 65–99)
Glucose-Capillary: 180 mg/dL — ABNORMAL HIGH (ref 65–99)
Glucose-Capillary: 144 mg/dL — ABNORMAL HIGH (ref 65–99)

## 2015-12-10 LAB — BASIC METABOLIC PANEL
ANION GAP: 5 (ref 5–15)
BUN: 6 mg/dL (ref 6–20)
CALCIUM: 8.7 mg/dL — AB (ref 8.9–10.3)
CO2: 25 mmol/L (ref 22–32)
Chloride: 104 mmol/L (ref 101–111)
Creatinine, Ser: 0.65 mg/dL (ref 0.44–1.00)
Glucose, Bld: 119 mg/dL — ABNORMAL HIGH (ref 65–99)
POTASSIUM: 3.9 mmol/L (ref 3.5–5.1)
Sodium: 134 mmol/L — ABNORMAL LOW (ref 135–145)

## 2015-12-10 LAB — CBC
HCT: 26.1 % — ABNORMAL LOW (ref 36.0–46.0)
Hemoglobin: 8.8 g/dL — ABNORMAL LOW (ref 12.0–15.0)
MCH: 32.1 pg (ref 26.0–34.0)
MCHC: 33.7 g/dL (ref 30.0–36.0)
MCV: 95.3 fL (ref 78.0–100.0)
PLATELETS: 215 10*3/uL (ref 150–400)
RBC: 2.74 MIL/uL — AB (ref 3.87–5.11)
RDW: 13.4 % (ref 11.5–15.5)
WBC: 11.4 10*3/uL — AB (ref 4.0–10.5)

## 2015-12-10 NOTE — Progress Notes (Signed)
[facility] Progress Note 3 Days Post-Op  Subjective: Patient a lot more bright this morning, responsive and answering questions, but a bit disoriented to place and time.  Objective: Vital signs in last 24 hours: Temp:  [97.8 F (36.6 C)-98.5 F (36.9 C)] 97.8 F (36.6 C) (10/20 0700) Pulse Rate:  [62-82] 76 (10/20 0700) Resp:  [17-27] 17 (10/20 0700) BP: (101-149)/(54-111) 116/66 (10/20 0700) SpO2:  [92 %-99 %] 95 % (10/20 0700) Last BM Date: 12/06/15  Intake/Output from previous day: 10/19 0701 - 10/20 0700 In: 600 [I.V.:600] Out: 725 [Urine:425; Drains:300] Intake/Output this shift: No intake/output data recorded.  General: No acute distress.  Able to answer questions.  Lungs: Clear to auscultation.  Sats are 99%  Abd: Soft, less distended than yesterday.  Excellent bowel sounds.  Extremities: No clinical signs or symptoms of DVT  Neuro: Intact  Lab Results:  @LABLAST2 (wbc:2,hgb:2,hct:2,plt:2) BMET ) Recent Labs  12/09/15 0404 12/10/15 0518  NA 130* 134*  K 4.4 3.9  CL 99* 104  CO2 25 25  GLUCOSE 176* 119*  BUN <5* 6  CREATININE 0.63 0.65  CALCIUM 8.5* 8.7*   PT/INR No results for input(s): LABPROT, INR in the last 72 hours. ABG No results for input(s): PHART, HCO3 in the last 72 hours.  Invalid input(s): PCO2, PO2  Studies/Results: No results found.  Anti-infectives: Anti-infectives    Start     Dose/Rate Route Frequency Ordered Stop   12/07/15 2200  trimethoprim (TRIMPEX) tablet 100 mg     100 mg Oral Daily at bedtime 12/07/15 1443     12/07/15 2000  ciprofloxacin (CIPRO) IVPB 400 mg     400 mg 200 mL/hr over 60 Minutes Intravenous Every 12 hours 12/07/15 1443 12/07/15 2104   12/07/15 0700  ciprofloxacin (CIPRO) IVPB 400 mg     400 mg 200 mL/hr over 60 Minutes Intravenous To ShortStay Surgical 12/06/15 1008 12/07/15 0753      Assessment/Plan: s/p Procedure(s): HAND ASSISTED LAPAROSCOPIC DISTAL PANCREATECTOMY Hyponatremia better.  Awake  more, did not receive Narcan  Will CPM.  Check drain fluid for amylase level or lipase level.  LOS: 3 days   Kathryne Eriksson. Dahlia Bailiff, MD, FACS [phone] [facility] 12/10/2015

## 2015-12-10 NOTE — Care Management Important Message (Signed)
Important Message  Patient Details  Name: Morgan Cooper MRN: [id number] Date of Birth: Jan 27, 1946   Medicare Important Message Given:  Yes    [name] [name] 12/10/2015, 11:16 AM

## 2015-12-10 NOTE — Care Management Important Message (Signed)
Important Message  Patient Details  Name: Morgan Cooper MRN: [id number] Date of Birth: 1945-04-20   Medicare Important Message Given:  Yes    [name] [name] 12/10/2015, 11:17 AM

## 2015-12-11 LAB — BASIC METABOLIC PANEL
Anion gap: 6 (ref 5–15)
BUN: 8 mg/dL (ref 6–20)
CO2: 25 mmol/L (ref 22–32)
Calcium: 8.5 mg/dL — ABNORMAL LOW (ref 8.9–10.3)
Chloride: 105 mmol/L (ref 101–111)
Creatinine, Ser: 0.65 mg/dL (ref 0.44–1.00)
GFR calc Af Amer: >60 mL/min (ref >60)
GFR calc non Af Amer: >60 mL/min (ref >60)
Glucose, Bld: 122 mg/dL — ABNORMAL HIGH (ref 65–99)
Potassium: 3.4 mmol/L — ABNORMAL LOW (ref 3.5–5.1)
Sodium: 136 mmol/L (ref 135–145)

## 2015-12-11 LAB — GLUCOSE, CAPILLARY
GLUCOSE-CAPILLARY: 104 mg/dL — AB (ref 65–99)
GLUCOSE-CAPILLARY: 137 mg/dL — AB (ref 65–99)
GLUCOSE-CAPILLARY: 171 mg/dL — AB (ref 65–99)
GLUCOSE-CAPILLARY: 193 mg/dL — AB (ref 65–99)

## 2015-12-11 LAB — CBC
HEMATOCRIT: 24.8 % — AB (ref 36.0–46.0)
HEMOGLOBIN: 8.4 g/dL — AB (ref 12.0–15.0)
MCH: 31.9 pg (ref 26.0–34.0)
MCHC: 33.9 g/dL (ref 30.0–36.0)
MCV: 94.3 fL (ref 78.0–100.0)
Platelets: 253 10*3/uL (ref 150–400)
RBC: 2.63 MIL/uL — ABNORMAL LOW (ref 3.87–5.11)
RDW: 13.4 % (ref 11.5–15.5)
WBC: 9.9 10*3/uL (ref 4.0–10.5)

## 2015-12-11 MED ORDER — LOPERAMIDE HCL 2 MG PO CAPS
2.0000 mg | ORAL_CAPSULE | Freq: Once | ORAL | Status: AC
  Administered 2015-12-12: 2 mg via ORAL
  Filled 2015-12-11: qty 1

## 2015-12-11 NOTE — Progress Notes (Signed)
[facility] Progress Note 4 Days Post-Op  Subjective: Patient much more alert and bright this AM.  Eating much better  Objective: Vital signs in last 24 hours: Temp:  [97.1 F (36.2 C)-98.5 F (36.9 C)] 97.1 F (36.2 C) (10/21 0700) Pulse Rate:  [63-74] 67 (10/21 0958) Resp:  [18-21] 21 (10/21 0800) BP: (109-151)/(54-71) 136/56 (10/21 0958) SpO2:  [91 %-100 %] 100 % (10/21 0800) Last BM Date: 12/10/15  Intake/Output from previous day: 10/20 0701 - 10/21 0700 In: 1347.5 [P.O.:360; I.V.:987.5] Out: 450 [Urine:400; Drains:50] Intake/Output this shift: No intake/output data recorded.  General: No distress.  Oriented and alert  Lungs: Clear  Abd: Soft, great bowel sounds.  Extremities: No clinical signs or symptoms of DVT  Neuro: Intact  Lab Results:  @LABLAST2 (wbc:2,hgb:2,hct:2,plt:2) BMET ) Recent Labs  12/10/15 0518 12/11/15 0354  NA 134* 136  K 3.9 3.4*  CL 104 105  CO2 25 25  GLUCOSE 119* 122*  BUN 6 8  CREATININE 0.65 0.65  CALCIUM 8.7* 8.5*   PT/INR No results for input(s): LABPROT, INR in the last 72 hours. ABG No results for input(s): PHART, HCO3 in the last 72 hours.  Invalid input(s): PCO2, PO2  Studies/Results: No results found.  Anti-infectives: Anti-infectives    Start     Dose/Rate Route Frequency Ordered Stop   12/07/15 2200  trimethoprim (TRIMPEX) tablet 100 mg     100 mg Oral Daily at bedtime 12/07/15 1443     12/07/15 2000  ciprofloxacin (CIPRO) IVPB 400 mg     400 mg 200 mL/hr over 60 Minutes Intravenous Every 12 hours 12/07/15 1443 12/07/15 2104   12/07/15 0700  ciprofloxacin (CIPRO) IVPB 400 mg     400 mg 200 mL/hr over 60 Minutes Intravenous To ShortStay Surgical 12/06/15 1008 12/07/15 0753      Assessment/Plan: s/p Procedure(s): HAND ASSISTED LAPAROSCOPIC DISTAL PANCREATECTOMY Advance diet Transfer to the floor.  [place] IVFs  LOS: 4 days   Kathryne Eriksson. Dahlia Bailiff, MD,  FACS [phone] [facility] 12/11/2015

## 2015-12-11 NOTE — Evaluation (Signed)
Physical Therapy Evaluation Patient Details Name: Morgan Cooper MRN: [id number] DOB: 1945-07-30 Today's Date: 12/11/2015   History of Present Illness  Pt had HAND ASSISTED LAPAROSCOPIC DISTAL PANCREATECTOMY  Clinical Impression  Pt admitted with above diagnosis. Pt currently with functional limitations due to the deficits listed below (see PT Problem List). Pt was able to ambulate to bathroom with min assist only for steadying.  Pt in pain and feel that she may need to use her RW initially and pt agrees.  Should progress well. Sister will be at home with pt.  Pt will benefit from skilled PT to increase their independence and safety with mobility to allow discharge to the venue listed below.      Follow Up Recommendations Home health PT;Supervision/Assistance - 24 hour    Equipment Recommendations  None recommended by PT    Recommendations for Other Services       Precautions / Restrictions Precautions Precautions: Fall Precaution Comments: drain Restrictions Weight Bearing Restrictions: No      Mobility  Bed Mobility Overal bed mobility: Needs Assistance Bed Mobility: Supine to Sit     Supine to sit: Min assist     General bed mobility comments: Pt needed incr time with a little assist to come to EOB.  Transfers Overall transfer level: Needs assistance Equipment used: None Transfers: Sit to/from Stand Sit to Stand: Min guard         General transfer comment: steadying assist needed for safety  Ambulation/Gait Ambulation/Gait assistance: Min assist Ambulation Distance (Feet): 40 Feet (20 feet x 2) Assistive device: None Gait Pattern/deviations: Step-through pattern;Decreased stride length;Trunk flexed   Gait velocity interpretation: Below normal speed for age/gender General Gait Details: Pt was able to walk in to bathroom and use bathroom.  She was nauseated and in pain and did not want to walk further. Did need some steadying assist but should do well.   Discussed she may need to use her RW intiially upon d/c .  Stairs            Wheelchair Mobility    Modified Rankin (Stroke Patients Only)       Balance Overall balance assessment: Needs assistance Sitting-balance support: No upper extremity supported;Feet supported Sitting balance-Leahy Scale: Fair     Standing balance support: No upper extremity supported;During functional activity Standing balance-Leahy Scale: Poor Standing balance comment: Needed steadying assist in standing. Washed hands at sink and held onto sink.                              Pertinent Vitals/Pain Pain Assessment: Faces Faces Pain Scale: Hurts even more Pain Location: abdomen Pain Descriptors / Indicators: Aching Pain Intervention(s): Limited activity within patient's tolerance;Monitored during session;Repositioned;Patient requesting pain meds-RN notified  VSS with sats 90% on RA.  Replaced O2 at 2L after walking.     Home Living Family/patient expects to be discharged to:: Private residence Living Arrangements: Other relatives Available Help at Discharge: Family;Available 24 hours/day (sister) Type of Home: House Home Access: Stairs to enter Entrance Stairs-Rails: None (they are installing a rail) Technical brewer of Steps: 3 Home Layout: One level Home Equipment: Walker - 4 wheels;Shower seat      Prior Function Level of Independence: Independent               Hand Dominance        Extremity/Trunk Assessment   Upper Extremity Assessment: Defer to OT evaluation  Lower Extremity Assessment: Generalized weakness      Cervical / Trunk Assessment: Kyphotic  Communication   Communication: No difficulties  Cognition Arousal/Alertness: Awake/alert Behavior During Therapy: WFL for tasks assessed/performed;Flat affect Overall Cognitive Status: Within Functional Limits for tasks assessed                      General Comments       Exercises     Assessment/Plan    PT Assessment Patient needs continued PT services  PT Problem List Decreased activity tolerance;Decreased balance;Decreased mobility;Decreased knowledge of use of DME;Decreased safety awareness;Decreased knowledge of precautions;Pain          PT Treatment Interventions DME instruction;Gait training;Functional mobility training;Therapeutic activities;Therapeutic exercise;Balance training;Stair training;Patient/family education    PT Goals (Current goals can be found in the Care Plan section)  Acute Rehab PT Goals Patient Stated Goal: to get better PT Goal Formulation: With patient Time For Goal Achievement: 12/25/15 Potential to Achieve Goals: Good    Frequency Min 3X/week   Barriers to discharge        Co-evaluation               End of Session Equipment Utilized During Treatment: Gait belt;Oxygen Activity Tolerance: Patient limited by fatigue;Patient limited by pain Patient left: in bed;with call bell/phone within reach;with family/visitor present Nurse Communication: Mobility status         Time: [id number] PT Time Calculation (min) (ACUTE ONLY): 20 min   Charges:   PT Evaluation $PT Eval Moderate Complexity: 1 Procedure     PT G CodesDenice Paradise Dec 21, 2015, 4:34 PM Jaianna Nicoll [facility] Acute Rehabilitation [phone] (pager)

## 2015-12-12 LAB — C DIFFICILE QUICK SCREEN W PCR REFLEX
C DIFFICLE (CDIFF) ANTIGEN: NEGATIVE
C Diff interpretation: NOT DETECTED
C Diff toxin: NEGATIVE

## 2015-12-12 LAB — GLUCOSE, CAPILLARY
GLUCOSE-CAPILLARY: 139 mg/dL — AB (ref 65–99)
Glucose-Capillary: 152 mg/dL — ABNORMAL HIGH (ref 65–99)
Glucose-Capillary: 114 mg/dL — ABNORMAL HIGH (ref 65–99)
Glucose-Capillary: 100 mg/dL — ABNORMAL HIGH (ref 65–99)

## 2015-12-12 NOTE — Progress Notes (Signed)
5 Days Post-Op  Subjective: Still with not much of an appetite. Having BM's Leaking around drain  Objective: Vital signs in last 24 hours: Temp:  [98.6 F (37 C)-99.1 F (37.3 C)] 99.1 F (37.3 C) (10/22 0623) Pulse Rate:  [63-73] 73 (10/22 0623) Resp:  [16-20] 17 (10/22 0623) BP: (136-156)/(56-79) 144/76 (10/22 0623) SpO2:  [97 %-99 %] 98 % (10/22 0623) Last BM Date: 12/11/15 (diarrhea)  Intake/Output from previous day: 10/21 0701 - 10/22 0700 In: 476.7 [P.O.:100; I.V.:376.7] Out: 157 [Drains:155; Stool:2] Intake/Output this shift: No intake/output data recorded.  Exam: Looks comfortable Abdomen soft, drain with serous fluid around it and in bulb.  Lab Results:   Recent Labs  12/10/15 0518 12/11/15 0354  WBC 11.4* 9.9  HGB 8.8* 8.4*  HCT 26.1* 24.8*  PLT 215 253   BMET  Recent Labs  12/10/15 0518 12/11/15 0354  NA 134* 136  K 3.9 3.4*  CL 104 105  CO2 25 25  GLUCOSE 119* 122*  BUN 6 8  CREATININE 0.65 0.65  CALCIUM 8.7* 8.5*   PT/INR No results for input(s): LABPROT, INR in the last 72 hours. ABG No results for input(s): PHART, HCO3 in the last 72 hours.  Invalid input(s): PCO2, PO2  Studies/Results: No results found.  Anti-infectives: Anti-infectives    Start     Dose/Rate Route Frequency Ordered Stop   12/07/15 2200  trimethoprim (TRIMPEX) tablet 100 mg     100 mg Oral Daily at bedtime 12/07/15 1443     12/07/15 2000  ciprofloxacin (CIPRO) IVPB 400 mg     400 mg 200 mL/hr over 60 Minutes Intravenous Every 12 hours 12/07/15 1443 12/07/15 2104   12/07/15 0700  ciprofloxacin (CIPRO) IVPB 400 mg     400 mg 200 mL/hr over 60 Minutes Intravenous To [facility] 12/06/15 1008 12/07/15 0753      Assessment/Plan: s/p Procedure(s): HAND ASSISTED LAPAROSCOPIC DISTAL PANCREATECTOMY (N/A)  Continue to encourage po and ambulation Change bandage at drain site.  LOS: 5 days    Lurlie Wigen A 12/12/2015

## 2015-12-13 LAB — BASIC METABOLIC PANEL
Anion gap: 7 (ref 5–15)
BUN: 8 mg/dL (ref 6–20)
CALCIUM: 8.7 mg/dL — AB (ref 8.9–10.3)
CO2: 27 mmol/L (ref 22–32)
CREATININE: 0.69 mg/dL (ref 0.44–1.00)
Chloride: 102 mmol/L (ref 101–111)
GFR calc Af Amer: >60 mL/min (ref >60)
GLUCOSE: 113 mg/dL — AB (ref 65–99)
Potassium: 3.8 mmol/L (ref 3.5–5.1)
Sodium: 136 mmol/L (ref 135–145)

## 2015-12-13 LAB — GLUCOSE, CAPILLARY
GLUCOSE-CAPILLARY: 136 mg/dL — AB (ref 65–99)
GLUCOSE-CAPILLARY: 125 mg/dL — AB (ref 65–99)
Glucose-Capillary: 115 mg/dL — ABNORMAL HIGH (ref 65–99)
Glucose-Capillary: 148 mg/dL — ABNORMAL HIGH (ref 65–99)

## 2015-12-13 LAB — CBC
HCT: 27 % — ABNORMAL LOW (ref 36.0–46.0)
Hemoglobin: 9 g/dL — ABNORMAL LOW (ref 12.0–15.0)
MCH: 31.4 pg (ref 26.0–34.0)
MCHC: 33.3 g/dL (ref 30.0–36.0)
MCV: 94.1 fL (ref 78.0–100.0)
PLATELETS: 382 10*3/uL (ref 150–400)
RBC: 2.87 MIL/uL — ABNORMAL LOW (ref 3.87–5.11)
RDW: 13.3 % (ref 11.5–15.5)
WBC: 13.9 10*3/uL — AB (ref 4.0–10.5)

## 2015-12-13 NOTE — Care Management Note (Addendum)
Case Management Note  Patient Details  Name: SUMMERLYNN MOREHART MRN: [id number] Date of Birth: [birth year]  Subjective/Objective:                    Action/Plan:  Confirmed with Santiago Glad patient had [place] 2010 , patient aware and wants [place] again. Spoke with patient regarding discharge plan. Confirmed face sheet information.   Patient has had home health services in past and would like same agency , however, unsure of the name , thinks it might be [facility] . Have call in to [facility] awaiting call back.   PT has not recommended any DME , patient confirms she does not need any DME. Patient does have home oxygen but only uses it at night and will not need portable tank on day of discharge. Patient currently on room air . If there is a change please notify case manager. Magdalen Spatz RN BSN [phone]   Expected Discharge Date:                  Expected Discharge Plan:  [place]  In-House Referral:     Discharge planning Services  CM Consult  Post Acute Care Choice:  Home Health Choice offered to:  Patient  DME Arranged:    DME Agency:     HH Arranged:  PT HH Agency:     Status of Service:  In process, will continue to follow  If discussed at Long Length of Stay Meetings, dates discussed:    Additional Comments:  Marilu Favre, RN 12/13/2015, 10:45 AM

## 2015-12-13 NOTE — Progress Notes (Addendum)
6 Days Post-Op  Subjective: Stable and alert. Afebrile  No new complaints No nausea or vomiting, but appetite still not good and not taking much food in Diarrhea has slowed down a good deal. C. difficile negative  Drain output 45 mL.  Also some serous drainage around drain.  No odor or purulence.  Looks benign.  PT involved.  HH PT recommended.  Objective: Vital signs in last 24 hours: Temp:  [97.7 F (36.5 C)-99.3 F (37.4 C)] 99.3 F (37.4 C) (10/22 2257) Pulse Rate:  [67-72] 67 (10/22 2257) Resp:  [17] 17 (10/22 2257) BP: (120-159)/(55-72) 159/72 (10/22 2257) SpO2:  [98 %-100 %] 100 % (10/22 2257) Last BM Date: 12/11/15  Intake/Output from previous day: 10/22 0701 - 10/23 0700 In: -  Out: 45 [Drains:45] Intake/Output this shift: No intake/output data recorded.  General appearance: Alert and cooperative.  A little forgetful.  In no distress.  Somewhat deconditioned.  Daughter in room. Resp: Wearing oxygen.  She uses home O2 at night at home and is followed bile about pulmonary.  Lungs are clear. GI: Soft.  Minimally tender.  Not distended.  Upper midline incision from HandPort looks good.  Hypoactive bowel sounds.  Drainage is serous.  Lab Results:  Results for orders placed or performed during the hospital encounter of 12/07/15 (from the past 24 hour(s))  Glucose, capillary     Status: Abnormal   Collection Time: 12/12/15  8:32 AM  Result Value Ref Range   Glucose-Capillary 139 (H) 65 - 99 mg/dL  Glucose, capillary     Status: Abnormal   Collection Time: 12/12/15 12:10 PM  Result Value Ref Range   Glucose-Capillary 152 (H) 65 - 99 mg/dL  Glucose, capillary     Status: Abnormal   Collection Time: 12/12/15  5:22 PM  Result Value Ref Range   Glucose-Capillary 114 (H) 65 - 99 mg/dL  Glucose, capillary     Status: Abnormal   Collection Time: 12/12/15 10:56 PM  Result Value Ref Range   Glucose-Capillary 100 (H) 65 - 99 mg/dL     Studies/Results: No results  found.  Marland Kitchen atorvastatin  20 mg Oral q1800  . azaTHIOprine  50 mg Oral Daily  . cycloSPORINE  1 drop Both Eyes BID  . feeding supplement (ENSURE ENLIVE)  237 mL Oral BID BM  . FLUoxetine  60 mg Oral Daily  . insulin aspart  0-9 Units Subcutaneous TID WC  . metoprolol  50 mg Oral BID  . pantoprazole  40 mg Oral Daily  . predniSONE  7.5 mg Oral Q breakfast  . QUEtiapine  200 mg Oral QHS  . senna  1 tablet Oral BID  . trimethoprim  100 mg Oral QHS     Assessment/Plan: s/p Procedure(s): HAND ASSISTED LAPAROSCOPIC DISTAL PANCREATECTOMY  POD #6.  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  CBG range 100-152. Making slow but steady progress.   Biggest issue is deconditioning and poor appetite   Push ambulation and diet today  Check labs 1 time today Patient requesting discharge home tomorrow  She lives in [place] with her sister  HHPT   Pulmonary fibrosis.  Home O2.  Former tobacco use. History renal transplant.  Remote.  Maintain on prednisone and Imuran History 2 stage resection for perforated diverticulitis Barrett's esophagus Hypertension-normotensive on current therapies Chronic diastolic heart failure listed, but no overt evidence of heart failure currently   @PROBHOSP @  LOS: 6 days    Gaelyn Tukes M 12/13/2015  . Marland Kitchen  prob

## 2015-12-13 NOTE — Progress Notes (Signed)
Physical Therapy Treatment Patient Details Name: TALOR WANDLING MRN: [id number] DOB: 02/02/1946 Today's Date: 12/13/2015    History of Present Illness Pt had HAND ASSISTED LAPAROSCOPIC DISTAL PANCREATECTOMY    PT Comments    Pt making steady progress and able to ambulate in hallway with RW today.  Follow Up Recommendations  Home health PT;Supervision/Assistance - 24 hour     Equipment Recommendations  None recommended by PT    Recommendations for Other Services       Precautions / Restrictions Precautions Precautions: Fall Precaution Comments: drain Restrictions Weight Bearing Restrictions: No    Mobility  Bed Mobility         Supine to sit: Min assist     General bed mobility comments: Daughter assisted pt to EOB  Transfers Overall transfer level: Needs assistance Equipment used: None Transfers: Sit to/from Stand Sit to Stand: Supervision         General transfer comment: S only  Ambulation/Gait Ambulation/Gait assistance: Min guard Ambulation Distance (Feet): 140 Feet Assistive device: Rolling walker (2 wheeled) Gait Pattern/deviations: Step-through pattern Gait velocity: decreased Gait velocity interpretation: Below normal speed for age/gender General Gait Details: Amb in room without AD, but wanted RW for longer distance due to 5/10 pain.  Decreased cadence, but step through pattern.   Stairs Stairs:  (Pt too fatigued to try, but verbally reviewed)          Wheelchair Mobility    Modified Rankin (Stroke Patients Only)       Balance   Sitting-balance support: Feet supported Sitting balance-Leahy Scale: Good     Standing balance support: No upper extremity supported Standing balance-Leahy Scale: Fair                      Cognition Arousal/Alertness: Awake/alert Behavior During Therapy: WFL for tasks assessed/performed;Flat affect Overall Cognitive Status: Within Functional Limits for tasks assessed                      Exercises      General Comments General comments (skin integrity, edema, etc.): Pt knowledgeable in seated LE therex      Pertinent Vitals/Pain Pain Assessment: 0-10 Pain Score: 5  Pain Location: abd Pain Descriptors / Indicators: Guarding Pain Intervention(s): Limited activity within patient's tolerance;Monitored during session    Home Living                      Prior Function            PT Goals (current goals can now be found in the care plan section) Acute Rehab PT Goals Patient Stated Goal: to get better PT Goal Formulation: With patient Time For Goal Achievement: 12/25/15 Potential to Achieve Goals: Good Progress towards PT goals: Progressing toward goals    Frequency    Min 3X/week      PT Plan Current plan remains appropriate    Co-evaluation             End of Session Equipment Utilized During Treatment: Gait belt Activity Tolerance: Patient limited by fatigue Patient left: in chair;with call bell/phone within reach;with family/visitor present     Time: [id number] PT Time Calculation (min) (ACUTE ONLY): 15 min  Charges:  $Gait Training: 8-22 mins                    G Codes:      Leeam Cedrone LUBECK 12/13/2015, 12:17 PM

## 2015-12-14 LAB — GLUCOSE, CAPILLARY
Glucose-Capillary: 115 mg/dL — ABNORMAL HIGH (ref 65–99)
Glucose-Capillary: 127 mg/dL — ABNORMAL HIGH (ref 65–99)

## 2015-12-14 NOTE — Progress Notes (Signed)
JP drain removed per order. Pt Discharged. Discharge paper reviewed Whit PT and she understands, familly member understands as well. Supplies sent home with them for dressing changes.

## 2015-12-14 NOTE — Discharge Summary (Signed)
Patient ID: Morgan Cooper [id number] 70 y.o. December 23, 1945  Admit date: 12/07/2015  Discharge date and time: 12/14/2015  Admitting Physician: Stark Klein  Discharge Physician: Adin Hector  Admission Diagnoses: DISTAL PANCREATIC MASS  Discharge Diagnoses: Microcystic serous cystadenoma of the pancreas Pulmonary fibrosis Secondary pulmonary hypertension Functioning renal transplant History two-stage resection for perforated diverticulitis Barrett's esophagus Hypertension, well controlled Chronic diastolic heart failure, controlled  Operations: Procedure(s): HAND ASSISTED LAPAROSCOPIC DISTAL PANCREATECTOMY  Admission Condition: good  Discharged Condition: fair  Indication for Admission: This is a 70 year old female with the above listed medical problems.  She has been followed for several years for a slowly enlarging cystic pancreatic lesion.  She is on Imuran and prednisone for functioning renal transplant.  The pancreatic lesion has been noted since 2008.  It has been progressively enlarging.  Denies diabetes or jaundice.  She is brought to the hospital electively for resection of her pancreatic mass.  Hospital Course: On the day of admission the patient was taken to the operating room and underwent hand-assisted distal pancreatectomy.  The spleen was preserved.  The details of the operation can be reviewed in her operative note.  Postoperatively she did fairly well.  Had moderately severe pain required stepdown unit for a few days.  Her appetite was slow to return but ultimately she resume normal diet and had normal bowel function.  She was deconditioned and was seen by physical therapy.  Home health PT was recommended that was arranged.  She had a JP drain to gravity drainage and it drained clear serosanguineous fluid without odor.  Never looked like pancreatic fluid.  The drainage was very low at the end of her hospital stay and the drain was removed.     On the day of  discharge the patient and her daughter felt ready for her to go home.  Her abdomen was soft and essentially nontender.  Upper midline wound looked good.  Drain site looked good.  Basically a benign postop exam.  Home health PT was arranged.  She was told to continue all of her usual medications.  She has hydrocodone at home.  No prescriptions were written.     Diet and activities were discussed.     Pathology report was discussed with patient.     She was asked to follow-up with Dr. Barry Dienes in one week and also with her primary care physician in 7-10 days to check her medical problems.  Consults: None  Significant Diagnostic Studies: Surgical pathology  Treatments: surgery: Distal pancreatectomy  Disposition: Home  Patient Instructions:    Medication List    TAKE these medications   aspirin 81 MG chewable tablet Chew 81 mg by mouth daily.   atorvastatin 20 MG tablet Commonly known as:  LIPITOR Take 20 mg by mouth daily.   azaTHIOprine 50 MG tablet Commonly known as:  IMURAN Take 50 mg by mouth every morning.   azithromycin 250 MG tablet Commonly known as:  ZITHROMAX Take 2 tablets by mouth today, then take 1 tablet daily until gone   cycloSPORINE 0.05 % ophthalmic emulsion Commonly known as:  RESTASIS Place 1 drop into both eyes 2 (two) times daily.   FLUoxetine 20 MG capsule Commonly known as:  PROZAC Take 60 mg by mouth daily.   furosemide 20 MG tablet Commonly known as:  LASIX TAKE ONE OR TWO TABLETS DAILY What changed:  See the new instructions.   HYDROcodone-acetaminophen 10-325 MG tablet Commonly known as:  NORCO Take 1 tablet by  mouth every 6 (six) hours as needed. For pain   LORazepam 1 MG tablet Commonly known as:  ATIVAN Take 1 tablet (1 mg total) by mouth every 8 (eight) hours.   metoprolol 50 MG tablet Commonly known as:  LOPRESSOR Take 1 tablet (50 mg total) by mouth 2 (two) times daily. Please call and schedule a six month follow up appointment  with Dr Irish Lack per last office visit   omeprazole 40 MG capsule Commonly known as:  PRILOSEC Take 40 mg by mouth daily.   ondansetron 4 MG tablet Commonly known as:  ZOFRAN Take 1 tablet (4 mg total) by mouth every 8 (eight) hours as needed for nausea or vomiting.   OXYGEN Inhale 1 L/min into the lungs at bedtime. And with activity if needed   predniSONE 5 MG tablet Commonly known as:  DELTASONE Take 7.5 mg daily   predniSONE 10 MG tablet Commonly known as:  DELTASONE Take 20 mg daily for 3 days, then 10 mg daily for 3 days then go back to your usual dose of 7.5 mg daily   PRESERVISION AREDS 2 Caps Take 2 capsules by mouth 2 (two) times daily.   Probiotic Caps Take 1 capsule by mouth daily.   QUEtiapine 200 MG tablet Commonly known as:  SEROQUEL Take 200 mg by mouth at bedtime.   Tiotropium Bromide Monohydrate 2.5 MCG/ACT Aers Commonly known as:  SPIRIVA RESPIMAT Inhale 2 puffs into the lungs daily. Reported on 08/11/2015   trimethoprim 100 MG tablet Commonly known as:  TRIMPEX Take 100 mg by mouth at bedtime.       Activity: no heavy lifting for 6 weeks Diet: low fat, low cholesterol diet Wound Care: as directed  Follow-up:  With Dr. Barry Dienes in 1 week.  Signed: Edsel Petrin. Dalbert Batman, M.D., FACS General and minimally invasive surgery Breast and Colorectal Surgery  12/14/2015, 7:55 AM

## 2015-12-14 NOTE — Consult Note (Signed)
           [facility] CM Primary Care Navigator  12/14/2015  Morgan Cooper March 04, 1945 [id number]   Patient seen at the bedside with daughter Morgan Cooper from [place]) to identify discharge needs. Daughter shared that patient had been having abdominal pains, nausea/ vomiting from pancreatic cyst for almost a year that had led to this admission/ surgery.  Plan is fordischarge home as stated.  Patient confirms that primary care provider is Dr. Charmaine Downs with [facility] at [facility]. Per daughter, patient's sister Morgan Cooper) is able to provide transportation to her doctors' appointments.  Patient states using [place] for regular prescriptions and uses [facility]) to obtain local medication needs without difficulty. Patient does own medication management using "pill box" system. Patient's sister Morgan Cooper) will be the primary caregiver at home. Patient has a daughter who lives in [place]) who can assist with her care needs if needed as well.  Patient and daughter had expressed understanding to contact primary care provider's office for a post discharge follow-up appointment within a week or sooner if needs arise.   Patient and daughter denied any other concerns or needs at this time.   For questions, please contact:  Dannielle Huh, BSN, RN- [facility] Primary Care Navigator  Telephone: [phone] [place]

## 2015-12-14 NOTE — Discharge Instructions (Signed)
Continue all of your usual medicines. No prescriptions were written, since you state you have a prescription for hydrocodone at home  Otherwise see detailed instructions above.

## 2015-12-15 DIAGNOSIS — Z483 Aftercare following surgery for neoplasm: Secondary | ICD-10-CM | POA: Diagnosis not present

## 2015-12-15 DIAGNOSIS — J8410 Pulmonary fibrosis, unspecified: Secondary | ICD-10-CM | POA: Diagnosis not present

## 2015-12-15 DIAGNOSIS — Z7952 Long term (current) use of systemic steroids: Secondary | ICD-10-CM | POA: Diagnosis not present

## 2015-12-15 DIAGNOSIS — Z940 Kidney transplant status: Secondary | ICD-10-CM | POA: Diagnosis not present

## 2015-12-15 DIAGNOSIS — Z90411 Acquired partial absence of pancreas: Secondary | ICD-10-CM | POA: Diagnosis not present

## 2015-12-15 DIAGNOSIS — I5032 Chronic diastolic (congestive) heart failure: Secondary | ICD-10-CM | POA: Diagnosis not present

## 2015-12-15 DIAGNOSIS — I2729 Other secondary pulmonary hypertension: Secondary | ICD-10-CM | POA: Diagnosis not present

## 2015-12-15 DIAGNOSIS — I110 Hypertensive heart disease with heart failure: Secondary | ICD-10-CM | POA: Diagnosis not present

## 2015-12-15 DIAGNOSIS — Z7982 Long term (current) use of aspirin: Secondary | ICD-10-CM | POA: Diagnosis not present

## 2015-12-15 LAB — TYPE AND SCREEN
ABO/RH(D): AB POS
Antibody Screen: NEGATIVE
UNIT DIVISION: 0
UNIT DIVISION: 0
UNIT DIVISION: 0
Unit division: 0

## 2015-12-16 LAB — MISC LABCORP TEST (SEND OUT): LABCORP TEST CODE: 829034

## 2015-12-17 LAB — LIPASE, FLUID: LIPASE-FLUID: 486 U/L

## 2015-12-20 DIAGNOSIS — Z90411 Acquired partial absence of pancreas: Secondary | ICD-10-CM | POA: Diagnosis not present

## 2015-12-20 DIAGNOSIS — I5032 Chronic diastolic (congestive) heart failure: Secondary | ICD-10-CM | POA: Diagnosis not present

## 2015-12-20 DIAGNOSIS — J8410 Pulmonary fibrosis, unspecified: Secondary | ICD-10-CM | POA: Diagnosis not present

## 2015-12-20 DIAGNOSIS — I2729 Other secondary pulmonary hypertension: Secondary | ICD-10-CM | POA: Diagnosis not present

## 2015-12-20 DIAGNOSIS — I110 Hypertensive heart disease with heart failure: Secondary | ICD-10-CM | POA: Diagnosis not present

## 2015-12-20 DIAGNOSIS — Z483 Aftercare following surgery for neoplasm: Secondary | ICD-10-CM | POA: Diagnosis not present

## 2015-12-21 DIAGNOSIS — N2581 Secondary hyperparathyroidism of renal origin: Secondary | ICD-10-CM | POA: Diagnosis not present

## 2015-12-21 DIAGNOSIS — I2721 Secondary pulmonary arterial hypertension: Secondary | ICD-10-CM | POA: Diagnosis not present

## 2015-12-21 DIAGNOSIS — R1013 Epigastric pain: Secondary | ICD-10-CM | POA: Diagnosis not present

## 2015-12-21 DIAGNOSIS — R110 Nausea: Secondary | ICD-10-CM | POA: Diagnosis not present

## 2015-12-21 DIAGNOSIS — I509 Heart failure, unspecified: Secondary | ICD-10-CM | POA: Diagnosis not present

## 2015-12-21 DIAGNOSIS — Z940 Kidney transplant status: Secondary | ICD-10-CM | POA: Diagnosis not present

## 2015-12-21 DIAGNOSIS — N185 Chronic kidney disease, stage 5: Secondary | ICD-10-CM | POA: Diagnosis not present

## 2015-12-22 ENCOUNTER — Other Ambulatory Visit: Payer: Self-pay | Admitting: General Surgery

## 2015-12-22 DIAGNOSIS — D72829 Elevated white blood cell count, unspecified: Secondary | ICD-10-CM | POA: Diagnosis not present

## 2015-12-22 DIAGNOSIS — T814XXA Infection following a procedure, initial encounter: Principal | ICD-10-CM

## 2015-12-22 DIAGNOSIS — IMO0001 Reserved for inherently not codable concepts without codable children: Secondary | ICD-10-CM

## 2015-12-23 ENCOUNTER — Ambulatory Visit
Admission: RE | Admit: 2015-12-23 | Discharge: 2015-12-23 | Disposition: A | Discharge status: Home / Self Care | Payer: Medicare Other | Source: Ambulatory Visit | Attending: General Surgery | Admitting: General Surgery

## 2015-12-23 ENCOUNTER — Inpatient Hospital Stay (HOSPITAL_COMMUNITY)
Admission: AD | Admit: 2015-12-23 | Discharge: 2015-12-27 | DRG: 919 | Disposition: A | Discharge status: Home Health | Payer: Medicare Other | Source: Ambulatory Visit | Attending: General Surgery | Admitting: General Surgery

## 2015-12-23 ENCOUNTER — Other Ambulatory Visit: Payer: Self-pay | Admitting: General Surgery

## 2015-12-23 DIAGNOSIS — IMO0001 Reserved for inherently not codable concepts without codable children: Secondary | ICD-10-CM

## 2015-12-23 DIAGNOSIS — T814XXA Infection following a procedure, initial encounter: Secondary | ICD-10-CM | POA: Diagnosis not present

## 2015-12-23 DIAGNOSIS — E860 Dehydration: Secondary | ICD-10-CM | POA: Diagnosis present

## 2015-12-23 DIAGNOSIS — Z85828 Personal history of other malignant neoplasm of skin: Secondary | ICD-10-CM | POA: Diagnosis not present

## 2015-12-23 DIAGNOSIS — I110 Hypertensive heart disease with heart failure: Secondary | ICD-10-CM | POA: Diagnosis present

## 2015-12-23 DIAGNOSIS — Z9841 Cataract extraction status, right eye: Secondary | ICD-10-CM

## 2015-12-23 DIAGNOSIS — F329 Major depressive disorder, single episode, unspecified: Secondary | ICD-10-CM | POA: Diagnosis present

## 2015-12-23 DIAGNOSIS — R1900 Intra-abdominal and pelvic swelling, mass and lump, unspecified site: Secondary | ICD-10-CM | POA: Diagnosis not present

## 2015-12-23 DIAGNOSIS — Z483 Aftercare following surgery for neoplasm: Secondary | ICD-10-CM | POA: Diagnosis not present

## 2015-12-23 DIAGNOSIS — K651 Peritoneal abscess: Secondary | ICD-10-CM | POA: Diagnosis not present

## 2015-12-23 DIAGNOSIS — Y838 Other surgical procedures as the cause of abnormal reaction of the patient, or of later complication, without mention of misadventure at the time of the procedure: Secondary | ICD-10-CM | POA: Diagnosis present

## 2015-12-23 DIAGNOSIS — F419 Anxiety disorder, unspecified: Secondary | ICD-10-CM | POA: Diagnosis present

## 2015-12-23 DIAGNOSIS — Z940 Kidney transplant status: Secondary | ICD-10-CM

## 2015-12-23 DIAGNOSIS — I272 Pulmonary hypertension, unspecified: Secondary | ICD-10-CM | POA: Diagnosis present

## 2015-12-23 DIAGNOSIS — E89823 Postprocedural seroma of an endocrine system organ or structure following other procedure: Principal | ICD-10-CM | POA: Diagnosis present

## 2015-12-23 DIAGNOSIS — I5032 Chronic diastolic (congestive) heart failure: Secondary | ICD-10-CM | POA: Diagnosis present

## 2015-12-23 DIAGNOSIS — G4730 Sleep apnea, unspecified: Secondary | ICD-10-CM | POA: Diagnosis present

## 2015-12-23 DIAGNOSIS — Z87891 Personal history of nicotine dependence: Secondary | ICD-10-CM | POA: Diagnosis not present

## 2015-12-23 DIAGNOSIS — Z9842 Cataract extraction status, left eye: Secondary | ICD-10-CM

## 2015-12-23 DIAGNOSIS — Z79899 Other long term (current) drug therapy: Secondary | ICD-10-CM | POA: Diagnosis not present

## 2015-12-23 DIAGNOSIS — R109 Unspecified abdominal pain: Secondary | ICD-10-CM | POA: Diagnosis not present

## 2015-12-23 DIAGNOSIS — E785 Hyperlipidemia, unspecified: Secondary | ICD-10-CM | POA: Diagnosis present

## 2015-12-23 DIAGNOSIS — Z7982 Long term (current) use of aspirin: Secondary | ICD-10-CM

## 2015-12-23 DIAGNOSIS — Z681 Body mass index (BMI) 19 or less, adult: Secondary | ICD-10-CM

## 2015-12-23 DIAGNOSIS — E43 Unspecified severe protein-calorie malnutrition: Secondary | ICD-10-CM | POA: Diagnosis present

## 2015-12-23 DIAGNOSIS — Z90411 Acquired partial absence of pancreas: Secondary | ICD-10-CM

## 2015-12-23 DIAGNOSIS — M810 Age-related osteoporosis without current pathological fracture: Secondary | ICD-10-CM | POA: Diagnosis present

## 2015-12-23 DIAGNOSIS — I2729 Other secondary pulmonary hypertension: Secondary | ICD-10-CM | POA: Diagnosis not present

## 2015-12-23 DIAGNOSIS — K3184 Gastroparesis: Secondary | ICD-10-CM | POA: Diagnosis present

## 2015-12-23 DIAGNOSIS — K8689 Other specified diseases of pancreas: Secondary | ICD-10-CM

## 2015-12-23 DIAGNOSIS — K2270 Barrett's esophagus without dysplasia: Secondary | ICD-10-CM | POA: Diagnosis present

## 2015-12-23 DIAGNOSIS — D72829 Elevated white blood cell count, unspecified: Secondary | ICD-10-CM

## 2015-12-23 DIAGNOSIS — R1012 Left upper quadrant pain: Secondary | ICD-10-CM | POA: Diagnosis not present

## 2015-12-23 DIAGNOSIS — I81 Portal vein thrombosis: Secondary | ICD-10-CM | POA: Diagnosis present

## 2015-12-23 DIAGNOSIS — J8410 Pulmonary fibrosis, unspecified: Secondary | ICD-10-CM | POA: Diagnosis not present

## 2015-12-23 DIAGNOSIS — R5082 Postprocedural fever: Secondary | ICD-10-CM | POA: Diagnosis not present

## 2015-12-23 DIAGNOSIS — K219 Gastro-esophageal reflux disease without esophagitis: Secondary | ICD-10-CM | POA: Diagnosis present

## 2015-12-23 DIAGNOSIS — Z8673 Personal history of transient ischemic attack (TIA), and cerebral infarction without residual deficits: Secondary | ICD-10-CM

## 2015-12-23 HISTORY — DX: Other specified diseases of pancreas: K86.89

## 2015-12-23 LAB — COMPREHENSIVE METABOLIC PANEL
ALBUMIN: 2.9 g/dL — AB (ref 3.5–5.0)
ALT: 23 U/L (ref 14–54)
ANION GAP: 10 (ref 5–15)
AST: 25 U/L (ref 15–41)
Alkaline Phosphatase: 92 U/L (ref 38–126)
BUN: 5 mg/dL — ABNORMAL LOW (ref 6–20)
CHLORIDE: 103 mmol/L (ref 101–111)
CO2: 22 mmol/L (ref 22–32)
Calcium: 9.4 mg/dL (ref 8.9–10.3)
Creatinine, Ser: 0.82 mg/dL (ref 0.44–1.00)
Glucose, Bld: 151 mg/dL — ABNORMAL HIGH (ref 65–99)
POTASSIUM: 4 mmol/L (ref 3.5–5.1)
SODIUM: 135 mmol/L (ref 135–145)
TOTAL PROTEIN: 6.7 g/dL (ref 6.5–8.1)
Total Bilirubin: 0.3 mg/dL (ref 0.3–1.2)

## 2015-12-23 LAB — PROTIME-INR
INR: 1.21
Prothrombin Time: 15.4 seconds — ABNORMAL HIGH (ref 11.4–15.2)

## 2015-12-23 LAB — CBC WITH DIFFERENTIAL/PLATELET
BASOS PCT: 0 %
Basophils Absolute: 0 10*3/uL (ref 0.0–0.1)
EOS ABS: 0 10*3/uL (ref 0.0–0.7)
Eosinophils Relative: 0 %
HEMATOCRIT: 31.6 % — AB (ref 36.0–46.0)
HEMOGLOBIN: 10.2 g/dL — AB (ref 12.0–15.0)
LYMPHS ABS: 0.8 10*3/uL (ref 0.7–4.0)
Lymphocytes Relative: 5 %
MCH: 30.2 pg (ref 26.0–34.0)
MCHC: 32.3 g/dL (ref 30.0–36.0)
MCV: 93.5 fL (ref 78.0–100.0)
Monocytes Absolute: 0.6 10*3/uL (ref 0.1–1.0)
Monocytes Relative: 4 %
NEUTROS ABS: 15.6 10*3/uL — AB (ref 1.7–7.7)
NEUTROS PCT: 91 %
Platelets: 883 10*3/uL — ABNORMAL HIGH (ref 150–400)
RBC: 3.38 MIL/uL — AB (ref 3.87–5.11)
RDW: 13.6 % (ref 11.5–15.5)
WBC: 17.1 10*3/uL — AB (ref 4.0–10.5)

## 2015-12-23 LAB — PHOSPHORUS: PHOSPHORUS: 3.4 mg/dL (ref 2.5–4.6)

## 2015-12-23 LAB — MAGNESIUM: MAGNESIUM: 2.1 mg/dL (ref 1.7–2.4)

## 2015-12-23 MED ORDER — KCL IN DEXTROSE-NACL 10-5-0.45 MEQ/L-%-% IV SOLN
INTRAVENOUS | Status: DC
  Administered 2015-12-23 – 2015-12-26 (×5): via INTRAVENOUS
  Administered 2015-12-26: 1 mL via INTRAVENOUS
  Administered 2015-12-26: 12:00:00 via INTRAVENOUS
  Filled 2015-12-23 (×13): qty 1000

## 2015-12-23 MED ORDER — MORPHINE SULFATE (PF) 2 MG/ML IV SOLN
1.0000 mg | INTRAVENOUS | Status: DC | PRN
  Administered 2015-12-23 – 2015-12-27 (×14): 2 mg via INTRAVENOUS
  Filled 2015-12-23 (×14): qty 1

## 2015-12-23 MED ORDER — AZATHIOPRINE 50 MG PO TABS
50.0000 mg | ORAL_TABLET | ORAL | Status: DC
  Administered 2015-12-24 – 2015-12-27 (×4): 50 mg via ORAL
  Filled 2015-12-23 (×6): qty 1

## 2015-12-23 MED ORDER — TRIMETHOPRIM 100 MG PO TABS
100.0000 mg | ORAL_TABLET | Freq: Every day | ORAL | Status: DC
  Administered 2015-12-23 – 2015-12-26 (×4): 100 mg via ORAL
  Filled 2015-12-23 (×4): qty 1

## 2015-12-23 MED ORDER — QUETIAPINE FUMARATE 100 MG PO TABS
200.0000 mg | ORAL_TABLET | Freq: Every day | ORAL | Status: DC
  Administered 2015-12-23 – 2015-12-26 (×4): 200 mg via ORAL
  Filled 2015-12-23 (×4): qty 2

## 2015-12-23 MED ORDER — FLUOXETINE HCL 20 MG PO CAPS
60.0000 mg | ORAL_CAPSULE | Freq: Every day | ORAL | Status: DC
  Administered 2015-12-23 – 2015-12-27 (×5): 60 mg via ORAL
  Filled 2015-12-23 (×5): qty 3

## 2015-12-23 MED ORDER — HEPARIN (PORCINE) IN NACL 100-0.45 UNIT/ML-% IJ SOLN
850.0000 [IU]/h | INTRAMUSCULAR | Status: DC
  Administered 2015-12-23: 850 [IU]/h via INTRAVENOUS
  Filled 2015-12-23 (×2): qty 250

## 2015-12-23 MED ORDER — METOPROLOL TARTRATE 50 MG PO TABS
50.0000 mg | ORAL_TABLET | Freq: Two times a day (BID) | ORAL | Status: DC
  Administered 2015-12-23 – 2015-12-27 (×8): 50 mg via ORAL
  Filled 2015-12-23 (×8): qty 1

## 2015-12-23 MED ORDER — CYCLOSPORINE 0.05 % OP EMUL
1.0000 [drp] | Freq: Two times a day (BID) | OPHTHALMIC | Status: DC
  Administered 2015-12-23 – 2015-12-27 (×8): 1 [drp] via OPHTHALMIC
  Filled 2015-12-23 (×8): qty 1

## 2015-12-23 MED ORDER — PIPERACILLIN-TAZOBACTAM 3.375 G IVPB
3.3750 g | Freq: Three times a day (TID) | INTRAVENOUS | Status: DC
  Administered 2015-12-23 – 2015-12-27 (×11): 3.375 g via INTRAVENOUS
  Filled 2015-12-23 (×12): qty 50

## 2015-12-23 MED ORDER — ATORVASTATIN CALCIUM 20 MG PO TABS
20.0000 mg | ORAL_TABLET | Freq: Every day | ORAL | Status: DC
  Administered 2015-12-23 – 2015-12-26 (×4): 20 mg via ORAL
  Filled 2015-12-23 (×5): qty 1

## 2015-12-23 MED ORDER — IOPAMIDOL (ISOVUE-300) INJECTION 61%
100.0000 mL | Freq: Once | INTRAVENOUS | Status: AC | PRN
  Administered 2015-12-23: 100 mL via INTRAVENOUS

## 2015-12-23 MED ORDER — HEPARIN BOLUS VIA INFUSION
3500.0000 [IU] | Freq: Once | INTRAVENOUS | Status: AC
  Administered 2015-12-23: 3500 [IU] via INTRAVENOUS
  Filled 2015-12-23: qty 3500

## 2015-12-23 NOTE — H&P (Signed)
Morgan Cooper is an 70 y.o. female.    Past Medical History:  Diagnosis Date  . Anxiety   . Arthritis    hips, legs, hand   . Barrett's esophagus   . Bone pain   . Bruises easily   . Cancer ([place])    skin cancer that has been removed- L hand  . CHF (congestive heart failure) ([place])   . Chills   . CVA (cerebral infarction)    Mini Strokes   . Depression   . Disturbed concentration   . Diverticulitis of large intestine with perforation Hx colectomy  . Esophageal stricture   . Fatigue   . Forgetfulness   . Gastroparesis   . Generalized headaches   . GERD (gastroesophageal reflux disease)   . Heart failure, diastolic, chronic ([place])   . History of hiatal hernia   . Hx of cardiovascular stress test    Lexiscan Myoview (06/2013):  No ischemia, EF 72%; Low Risk  . Hyperlipemia   . Hypertension   . Irritable   . Itch    skin  . Keratosis   . Macular degeneration    left eye  . Nausea    little vomitting  . Osteoporosis   . Pancreatic cyst   . Panic attacks   . Persistent headaches   . Pulmonary hypertension   . Rapid heart rate    some times  . Renal failure    transplant - 1983,  followed by Dr. Justin Mend   . Sleep apnea    uses at bedtime- 2liters/min , negative- apnea., last study- 2/017  . Sleep difficulties   . Stroke ([place])   . TIA (transient ischemic attack)     Past Surgical History:  Procedure Laterality Date  . APPENDECTOMY    . cataract surgery bilateral    . COLON RESECTION  2010   Colostomy  . COLOSTOMY  2011   Reversal   . colostomy reversal    . ESOPHAGEAL DILATION    . KIDNEY TRANSPLANT  1984  . left arm surgery     due to injury, two plates in place   . PANCREATECTOMY N/A 12/07/2015   Procedure: HAND ASSISTED LAPAROSCOPIC DISTAL PANCREATECTOMY;  Surgeon: Stark Klein, MD;  Location: [facility];  Service: General;  Laterality: N/A;  . PARATHYROIDECTOMY  11/04/2010   left inferior parathyroid    Family History  Problem Relation Age of Onset  . Lung  cancer Sister   . Heart disease Mother   . Stroke Father   . Aneurysm Brother   . Colon cancer Maternal Aunt 56  . Cancer    . Esophageal cancer Neg Hx   . Rectal cancer Neg Hx   . Stomach cancer Neg Hx    Social History:  reports that she quit smoking about 26 years ago. Her smoking use included Cigarettes. She has a 5.00 pack-year smoking history. She has never used smokeless tobacco. She reports that she does not drink alcohol or use drugs.  Allergies:  Allergies  Allergen Reactions  . Amoxicillin Diarrhea    Has patient had a PCN reaction causing immediate rash, facial/tongue/throat swelling, SOB or lightheadedness with hypotension: No Has patient had a PCN reaction causing severe rash involving mucus membranes or skin necrosis: No Has patient had a PCN reaction that required hospitalization No Has patient had a PCN reaction occurring within the last 10 years: Yes If all of the above answers are "NO", then may proceed with Cephalosporin use.   Marland Kitchen  Tape Rash     (Not in a hospital admission)  No results found for this or any previous visit (from the past 48 hour(s)). Ct Abdomen Pelvis W Contrast  Addendum Date: 12/23/2015   ADDENDUM REPORT: 12/23/2015 15:55 ADDENDUM: Acute findings discussed with and reconfirmed by Dr.Chaka Boyson on 12/23/2015 at 3:50 pm. Electronically Signed   By: Elon Alas M.D.   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 Now with severe LEFT upper quadrant pain, assess for abscess. History of colectomy for diverticulitis, appendectomy, kidney transplant. EXAM: CT ABDOMEN AND PELVIS WITH CONTRAST TECHNIQUE: Multidetector CT imaging of the abdomen and pelvis was performed using the standard protocol following bolus administration of intravenous contrast. CONTRAST:  110mL ISOVUE-300 IOPAMIDOL (ISOVUE-300) INJECTION 61% COMPARISON:  CT abdomen Jul 16, 2014 FINDINGS: LOWER CHEST: Bibasilar  bullous changes, chronic interstitial changes. Heart size is mildly enlarged. Severe coronary artery calcifications. No pericardial effusions. HEPATOBILIARY: Liver and gallbladder are normal. PANCREAS: Interval partial pancreatectomy. 5.2 x 5.6 cm focal fluid collection with rim enhancement within the pancreatic body, at the level the surgical bed. Collection is contiguous with ill-defined expansile fluid collection LEFT abdomen with small amount of subcutaneous gas. Residual pancreatic head and proximal body are normal. SPLEEN: Heterogeneous geographic enhancement of the spleen on early phase, though this may be secondary to bolus timing and is incompletely imaged on the delayed phase. Lack of normal contrast opacification of the splenic hilar vessels. ADRENALS/URINARY TRACT: Atrophic native kidneys. Stable 12 mm LEFT upper pole cyst. Stable 6 mm dense lesion. LEFT pelvic renal transplant with normal enhancement, similar dilated LEFT interpolar calyx without hydronephrosis, nephrolithiasis or renal masses. Normal contrast excretion on delayed phase. Urinary bladder is well distended and unremarkable. Normal adrenal glands. STOMACH/BOWEL: Mass effect on the stomach due to LEFT abdomen fluid collection. The stomach, small and large bowel are normal in course and caliber without inflammatory changes. Normal appendix. VASCULAR/LYMPHATIC: Central thrombosis superior mesenteric vein. Severe calcific atherosclerosis, 3 cm infrarenal aortic aneurysm. Chronic infrarenal aortic dissection and aneurysm, with increasing RIGHT intramural hematoma, aneurysm is now 4.2 cm in transaxial dimension, previously 3.5 cm in 2015 and 3.8 cm in 2016. REPRODUCTIVE: Normal. OTHER: None. MUSCULOSKELETAL: Nonacute. Anterior abdominal wall scarring at site of recent surgery without focal fluid collection. LEFT anterior abdominal wall scarring. Severe degenerative change of LEFT hip. Osteopenia. Grade 2 L5-S1 anterolisthesis without  spondylolysis. IMPRESSION: 5.2 x 5.6 cm seroma versus abscess within pancreatic body surgical bed. Free fluid about the pancreatic tail surgical bed, given local mass-effect this is concerning for phlegmon. Acute thrombosis superior mesenteric vein. Heterogeneous enhancement of the spleen with lack of normal splenic hilar vessels concerning for splenic vascular compromise and infarcts. Alternatively this could for represent early splenic infection. Enlarging 4.2 cm infrarenal aortic aneurysm. Recommend followup by ultrasound in 1 year. This recommendation follows ACR consensus guidelines: White Paper of the ACR Incidental Findings Committee II on Vascular Findings. J Am Coll Radiol 2013; 10:789-794. Electronically Signed: By: Elon Alas M.D. On: 12/23/2015 15:41    ROS  There were no vitals taken for this visit. Physical Exam  Constitutional: She is oriented to person, place, and time. She appears well-developed and well-nourished. She appears distressed.  HENT:  Head: Normocephalic and atraumatic.  Eyes: Conjunctivae are normal. Pupils are equal, round, and reactive to light.  Neck: Normal range of motion. Neck supple.  Cardiovascular: Normal rate.   Respiratory: Effort normal.  GI: Soft. She  exhibits distension.  Musculoskeletal: Normal range of motion.  Neurological: She is alert and oriented to person, place, and time.  Skin: Skin is warm and dry.  Psychiatric: She has a normal mood and affect. Her behavior is normal. Judgment and thought content normal.

## 2015-12-23 NOTE — H&P (Signed)
Morgan Cooper is an 70 y.o. female.   Chief Complaint: Abdominal pain HPI: This patient is status post a distal pancreatectomy by Dr. Barry Dienes on October 17. She was discharged about a week ago. She has continued to have left upper quadrant abdominal pain since surgery. She has had subjective fever. The pain is described as sharp. She has no nausea or vomiting. Her appetite is been marginal. She is doing her bowels. A CT scan of the abdomen and pelvis today showed a fluid collection around the pancreas and an SMV thrombosis.  Past Medical History:  Diagnosis Date  . Anxiety   . Arthritis    hips, legs, hand   . Barrett's esophagus   . Bone pain   . Bruises easily   . Cancer ([place])    skin cancer that has been removed- L hand  . CHF (congestive heart failure) ([place])   . Chills   . CVA (cerebral infarction)    Mini Strokes   . Depression   . Disturbed concentration   . Diverticulitis of large intestine with perforation Hx colectomy  . Esophageal stricture   . Fatigue   . Forgetfulness   . Gastroparesis   . Generalized headaches   . GERD (gastroesophageal reflux disease)   . Heart failure, diastolic, chronic ([place])   . History of hiatal hernia   . Hx of cardiovascular stress test    Lexiscan Myoview (06/2013):  No ischemia, EF 72%; Low Risk  . Hyperlipemia   . Hypertension   . Irritable   . Itch    skin  . Keratosis   . Macular degeneration    left eye  . Nausea    little vomitting  . Osteoporosis   . Pancreatic cyst   . Panic attacks   . Persistent headaches   . Pulmonary hypertension   . Rapid heart rate    some times  . Renal failure    transplant - 1983,  followed by Dr. Justin Mend   . Sleep apnea    uses at bedtime- 2liters/min , negative- apnea., last study- 2/017  . Sleep difficulties   . Stroke ([place])   . TIA (transient ischemic attack)     Past Surgical History:  Procedure Laterality Date  . APPENDECTOMY    . cataract surgery bilateral    . COLON RESECTION  2010    Colostomy  . COLOSTOMY  2011   Reversal   . colostomy reversal    . ESOPHAGEAL DILATION    . KIDNEY TRANSPLANT  1984  . left arm surgery     due to injury, two plates in place   . PANCREATECTOMY N/A 12/07/2015   Procedure: HAND ASSISTED LAPAROSCOPIC DISTAL PANCREATECTOMY;  Surgeon: Stark Klein, MD;  Location: [facility];  Service: General;  Laterality: N/A;  . PARATHYROIDECTOMY  11/04/2010   left inferior parathyroid    Family History  Problem Relation Age of Onset  . Lung cancer Sister   . Heart disease Mother   . Stroke Father   . Aneurysm Brother   . Colon cancer Maternal Aunt 73  . Cancer    . Esophageal cancer Neg Hx   . Rectal cancer Neg Hx   . Stomach cancer Neg Hx    Social History:  reports that she quit smoking about 26 years ago. Her smoking use included Cigarettes. She has a 5.00 pack-year smoking history. She has never used smokeless tobacco. She reports that she does not drink alcohol or use drugs.  Allergies:  Allergies  Allergen Reactions  . Amoxicillin Diarrhea    Has patient had a PCN reaction causing immediate rash, facial/tongue/throat swelling, SOB or lightheadedness with hypotension: No Has patient had a PCN reaction causing severe rash involving mucus membranes or skin necrosis: No Has patient had a PCN reaction that required hospitalization No Has patient had a PCN reaction occurring within the last 10 years: Yes If all of the above answers are "NO", then may proceed with Cephalosporin use.   . Tape Rash    Medications Prior to Admission  Medication Sig Dispense Refill  . aspirin 81 MG chewable tablet Chew 81 mg by mouth daily.    Marland Kitchen atorvastatin (LIPITOR) 20 MG tablet Take 20 mg by mouth daily.     Marland Kitchen azaTHIOprine (IMURAN) 50 MG tablet Take 50 mg by mouth every morning.     Marland Kitchen azithromycin (ZITHROMAX) 250 MG tablet Take 2 tablets by mouth today, then take 1 tablet daily until gone (Patient not taking: Reported on 11/29/2015) 6 tablet 0  . cycloSPORINE  (RESTASIS) 0.05 % ophthalmic emulsion Place 1 drop into both eyes 2 (two) times daily.      Marland Kitchen FLUoxetine (PROZAC) 20 MG capsule Take 60 mg by mouth daily.     . furosemide (LASIX) 20 MG tablet TAKE ONE OR TWO TABLETS DAILY (Patient taking differently: Take 20 mgs twice daily as needed for swelling) 30 tablet 6  . HYDROcodone-acetaminophen (NORCO) 10-325 MG per tablet Take 1 tablet by mouth every 6 (six) hours as needed. For pain    . LORazepam (ATIVAN) 1 MG tablet Take 1 tablet (1 mg total) by mouth every 8 (eight) hours. (Patient not taking: Reported on 11/29/2015) 2 tablet 0  . metoprolol (LOPRESSOR) 50 MG tablet Take 1 tablet (50 mg total) by mouth 2 (two) times daily. Please call and schedule a six month follow up appointment with Dr Irish Lack per last office visit 60 tablet 6  . Multiple Vitamins-Minerals (PRESERVISION AREDS 2) CAPS Take 2 capsules by mouth 2 (two) times daily.    Marland Kitchen omeprazole (PRILOSEC) 40 MG capsule Take 40 mg by mouth daily.    . ondansetron (ZOFRAN) 4 MG tablet Take 1 tablet (4 mg total) by mouth every 8 (eight) hours as needed for nausea or vomiting. 30 tablet 1  . OXYGEN Inhale 1 L/min into the lungs at bedtime. And with activity if needed    . predniSONE (DELTASONE) 10 MG tablet Take 20 mg daily for 3 days, then 10 mg daily for 3 days then go back to your usual dose of 7.5 mg daily (Patient not taking: Reported on 11/29/2015) 9 tablet 0  . predniSONE (DELTASONE) 5 MG tablet Take 7.5 mg daily    . Probiotic CAPS Take 1 capsule by mouth daily.    . QUEtiapine (SEROQUEL) 200 MG tablet Take 200 mg by mouth at bedtime.     . Tiotropium Bromide Monohydrate (SPIRIVA RESPIMAT) 2.5 MCG/ACT AERS Inhale 2 puffs into the lungs daily. Reported on 08/11/2015 (Patient not taking: Reported on 11/29/2015) 1 Inhaler 5  . trimethoprim (TRIMPEX) 100 MG tablet Take 100 mg by mouth at bedtime.       Results for orders placed or performed during the hospital encounter of 12/23/15 (from the past 48  hour(s))  CBC WITH DIFFERENTIAL     Status: Abnormal   Collection Time: 12/23/15  6:13 PM  Result Value Ref Range   WBC 17.1 (H) 4.0 - 10.5 K/uL   RBC 3.38 (  L) 3.87 - 5.11 MIL/uL   Hemoglobin 10.2 (L) 12.0 - 15.0 g/dL   HCT 31.6 (L) 36.0 - 46.0 %   MCV 93.5 78.0 - 100.0 fL   MCH 30.2 26.0 - 34.0 pg   MCHC 32.3 30.0 - 36.0 g/dL   RDW 13.6 11.5 - 15.5 %   Platelets 883 (H) 150 - 400 K/uL   Neutrophils Relative % 91 %   Neutro Abs 15.6 (H) 1.7 - 7.7 K/uL   Lymphocytes Relative 5 %   Lymphs Abs 0.8 0.7 - 4.0 K/uL   Monocytes Relative 4 %   Monocytes Absolute 0.6 0.1 - 1.0 K/uL   Eosinophils Relative 0 %   Eosinophils Absolute 0.0 0.0 - 0.7 K/uL   Basophils Relative 0 %   Basophils Absolute 0.0 0.0 - 0.1 K/uL  Comprehensive metabolic panel     Status: Abnormal   Collection Time: 12/23/15  6:13 PM  Result Value Ref Range   Sodium 135 135 - 145 mmol/L   Potassium 4.0 3.5 - 5.1 mmol/L   Chloride 103 101 - 111 mmol/L   CO2 22 22 - 32 mmol/L   Glucose, Bld 151 (H) 65 - 99 mg/dL   BUN 5 (L) 6 - 20 mg/dL   Creatinine, Ser 0.82 0.44 - 1.00 mg/dL   Calcium 9.4 8.9 - 10.3 mg/dL   Total Protein 6.7 6.5 - 8.1 g/dL   Albumin 2.9 (L) 3.5 - 5.0 g/dL   AST 25 15 - 41 U/L   ALT 23 14 - 54 U/L   Alkaline Phosphatase 92 38 - 126 U/L   Total Bilirubin 0.3 0.3 - 1.2 mg/dL   GFR calc non Af Amer >60 >60 mL/min   GFR calc Af Amer >60 >60 mL/min    Comment: (NOTE) The eGFR has been calculated using the CKD EPI equation. This calculation has not been validated in all clinical situations. eGFR's persistently <60 mL/min signify possible Chronic Kidney Disease.    Anion gap 10 5 - 15  Magnesium     Status: None   Collection Time: 12/23/15  6:13 PM  Result Value Ref Range   Magnesium 2.1 1.7 - 2.4 mg/dL  Phosphorus     Status: None   Collection Time: 12/23/15  6:13 PM  Result Value Ref Range   Phosphorus 3.4 2.5 - 4.6 mg/dL  Protime-INR     Status: Abnormal   Collection Time: 12/23/15  6:13  PM  Result Value Ref Range   Prothrombin Time 15.4 (H) 11.4 - 15.2 seconds   INR 1.21    Ct Abdomen Pelvis W Contrast  Addendum Date: 12/23/2015   ADDENDUM REPORT: 12/23/2015 15:55 ADDENDUM: Acute findings discussed with and reconfirmed by Dr.FAERA BYERLY on 12/23/2015 at 3:50 pm. Electronically Signed   By: Elon Alas M.D.   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 Now with severe LEFT upper quadrant pain, assess for abscess. History of colectomy for diverticulitis, appendectomy, kidney transplant. EXAM: CT ABDOMEN AND PELVIS WITH CONTRAST TECHNIQUE: Multidetector CT imaging of the abdomen and pelvis was performed using the standard protocol following bolus administration of intravenous contrast. CONTRAST:  118m ISOVUE-300 IOPAMIDOL (ISOVUE-300) INJECTION 61% COMPARISON:  CT abdomen Jul 16, 2014 FINDINGS: LOWER CHEST: Bibasilar bullous changes, chronic interstitial changes. Heart size is mildly enlarged. Severe coronary artery calcifications. No pericardial effusions. HEPATOBILIARY: Liver and gallbladder are normal. PANCREAS: Interval partial pancreatectomy. 5.2 x 5.6 cm focal fluid collection  with rim enhancement within the pancreatic body, at the level the surgical bed. Collection is contiguous with ill-defined expansile fluid collection LEFT abdomen with small amount of subcutaneous gas. Residual pancreatic head and proximal body are normal. SPLEEN: Heterogeneous geographic enhancement of the spleen on early phase, though this may be secondary to bolus timing and is incompletely imaged on the delayed phase. Lack of normal contrast opacification of the splenic hilar vessels. ADRENALS/URINARY TRACT: Atrophic native kidneys. Stable 12 mm LEFT upper pole cyst. Stable 6 mm dense lesion. LEFT pelvic renal transplant with normal enhancement, similar dilated LEFT interpolar calyx without hydronephrosis, nephrolithiasis or renal masses.  Normal contrast excretion on delayed phase. Urinary bladder is well distended and unremarkable. Normal adrenal glands. STOMACH/BOWEL: Mass effect on the stomach due to LEFT abdomen fluid collection. The stomach, small and large bowel are normal in course and caliber without inflammatory changes. Normal appendix. VASCULAR/LYMPHATIC: Central thrombosis superior mesenteric vein. Severe calcific atherosclerosis, 3 cm infrarenal aortic aneurysm. Chronic infrarenal aortic dissection and aneurysm, with increasing RIGHT intramural hematoma, aneurysm is now 4.2 cm in transaxial dimension, previously 3.5 cm in 2015 and 3.8 cm in 2016. REPRODUCTIVE: Normal. OTHER: None. MUSCULOSKELETAL: Nonacute. Anterior abdominal wall scarring at site of recent surgery without focal fluid collection. LEFT anterior abdominal wall scarring. Severe degenerative change of LEFT hip. Osteopenia. Grade 2 L5-S1 anterolisthesis without spondylolysis. IMPRESSION: 5.2 x 5.6 cm seroma versus abscess within pancreatic body surgical bed. Free fluid about the pancreatic tail surgical bed, given local mass-effect this is concerning for phlegmon. Acute thrombosis superior mesenteric vein. Heterogeneous enhancement of the spleen with lack of normal splenic hilar vessels concerning for splenic vascular compromise and infarcts. Alternatively this could for represent early splenic infection. Enlarging 4.2 cm infrarenal aortic aneurysm. Recommend followup by ultrasound in 1 year. This recommendation follows ACR consensus guidelines: White Paper of the ACR Incidental Findings Committee II on Vascular Findings. J Am Coll Radiol 2013; 10:789-794. Electronically Signed: By: Elon Alas M.D. On: 12/23/2015 15:41    Review of Systems  Constitutional: Positive for fever.  Respiratory: Negative for cough and shortness of breath.   Cardiovascular: Negative for chest pain.  Gastrointestinal: Positive for abdominal pain. Negative for diarrhea, nausea and  vomiting.  Genitourinary: Positive for dysuria.  All other systems reviewed and are negative.   Blood pressure (!) 141/75, pulse 83, temperature 98.7 F (37.1 C), temperature source Oral, resp. rate 17, weight 51.5 kg (113 lb 9.6 oz), SpO2 90 %. Physical Exam  Constitutional: She is oriented to person, place, and time. She appears well-developed and well-nourished. No distress.  HENT:  Head: Normocephalic and atraumatic.  Right Ear: External ear normal.  Left Ear: External ear normal.  Nose: Nose normal.  Eyes: Pupils are equal, round, and reactive to light. No scleral icterus.  Neck: No tracheal deviation present.  Cardiovascular: Normal rate and regular rhythm.   Respiratory: Effort normal and breath sounds normal. She has no wheezes.  GI: Soft.  There is fullness and mild to moderate tenderness in the epigastrium and left upper quadrant. The rest of the abdomen is soft. There is no frank peritonitis  Musculoskeletal: Normal range of motion.  Neurological: She is alert and oriented to person, place, and time.  Skin: Skin is warm. She is not diaphoretic. No erythema.  Psychiatric: Her behavior is normal.     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 Dr. Barry Dienes will see  her in the morning. Interventional radiology may need to place a drain.  Harl Bowie, MD 12/23/2015, 8:33 PM

## 2015-12-23 NOTE — Progress Notes (Signed)
ANTICOAGULATION CONSULT NOTE - Initial Consult  Pharmacy Consult for Heparin Indication: SMV thrombus  Allergies  Allergen Reactions  . Amoxicillin Diarrhea    Has patient had a PCN reaction causing immediate rash, facial/tongue/throat swelling, SOB or lightheadedness with hypotension: No Has patient had a PCN reaction causing severe rash involving mucus membranes or skin necrosis: No Has patient had a PCN reaction that required hospitalization No Has patient had a PCN reaction occurring within the last 10 years: Yes If all of the above answers are "NO", then may proceed with Cephalosporin use.   . Tape Rash    Patient Measurements: Weight: 113 lb 9.6 oz (51.5 kg) Heparin Dosing Weight:  51.5 kg  Vital Signs: Temp: 98.7 F (37.1 C) (11/02 1704) Temp Source: Oral (11/02 1704) BP: 141/75 (11/02 1704) Pulse Rate: 83 (11/02 1704)  Labs: No results for input(s): HGB, HCT, PLT, APTT, LABPROT, INR, HEPARINUNFRC, HEPRLOWMOCWT, CREATININE, CKTOTAL, CKMB, TROPONINI in the last 72 hours.  Estimated Creatinine Clearance: 52.4 mL/min (by C-G formula based on SCr of 0.69 mg/dL).   Medical History: Past Medical History:  Diagnosis Date  . Anxiety   . Arthritis    hips, legs, hand   . Barrett's esophagus   . Bone pain   . Bruises easily   . Cancer ([place])    skin cancer that has been removed- L hand  . CHF (congestive heart failure) ([place])   . Chills   . CVA (cerebral infarction)    Mini Strokes   . Depression   . Disturbed concentration   . Diverticulitis of large intestine with perforation Hx colectomy  . Esophageal stricture   . Fatigue   . Forgetfulness   . Gastroparesis   . Generalized headaches   . GERD (gastroesophageal reflux disease)   . Heart failure, diastolic, chronic ([place])   . History of hiatal hernia   . Hx of cardiovascular stress test    Lexiscan Myoview (06/2013):  No ischemia, EF 72%; Low Risk  . Hyperlipemia   . Hypertension   . Irritable   . Itch    skin  . Keratosis   . Macular degeneration    left eye  . Nausea    little vomitting  . Osteoporosis   . Pancreatic cyst   . Panic attacks   . Persistent headaches   . Pulmonary hypertension   . Rapid heart rate    some times  . Renal failure    transplant - 1983,  followed by Dr. Justin Mend   . Sleep apnea    uses at bedtime- 2liters/min , negative- apnea., last study- 2/017  . Sleep difficulties   . Stroke ([place])   . TIA (transient ischemic attack)     Medications: see med rec  Assessment: 70 y/o F directly admitted from [facility] s/p distal pancreatectomy 10/17. Patient c/o severe L upper quadrant pain.  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 Last Hgb on 12/13/15 was 9 and plts 382.  ID: Zosyn for abdominal infection. Afebrile currently. - 11/2 CT: seroma versus abscess within pancreatic body surgical bed. Free fluid about the pancreatic tail surgical bed, given local mass-effect this is concerning for phlegmon.  Goal of Therapy:  Heparin level 0.3-0.7 units/ml Monitor platelets by anticoagulation protocol: Yes   Plan:  F/u baseline labs Start IV heparin 3500 unit bolus Heparin infusion at 850 units/hr Check heparin level in 8 hrs. Daily HL and CBC   Delainey Winstanley S. Alford Highland, PharmD, BCPS Clinical Staff  Pharmacist Pager [phone]  Eilene Ghazi Stillinger 12/23/2015,6:00 PM

## 2015-12-24 ENCOUNTER — Encounter (HOSPITAL_COMMUNITY): Payer: Self-pay | Admitting: General Practice

## 2015-12-24 ENCOUNTER — Inpatient Hospital Stay (HOSPITAL_COMMUNITY): Payer: Medicare Other

## 2015-12-24 LAB — BODY FLUID CELL COUNT WITH DIFFERENTIAL

## 2015-12-24 LAB — BASIC METABOLIC PANEL
Anion gap: 8 (ref 5–15)
CALCIUM: 8.8 mg/dL — AB (ref 8.9–10.3)
CO2: 21 mmol/L — ABNORMAL LOW (ref 22–32)
CREATININE: 0.82 mg/dL (ref 0.44–1.00)
Chloride: 107 mmol/L (ref 101–111)
GFR calc Af Amer: >60 mL/min (ref >60)
Glucose, Bld: 158 mg/dL — ABNORMAL HIGH (ref 65–99)
Potassium: 4 mmol/L (ref 3.5–5.1)
SODIUM: 136 mmol/L (ref 135–145)

## 2015-12-24 LAB — CBC
HCT: 28.7 % — ABNORMAL LOW (ref 36.0–46.0)
HEMOGLOBIN: 9.4 g/dL — AB (ref 12.0–15.0)
MCH: 30.6 pg (ref 26.0–34.0)
MCHC: 32.8 g/dL (ref 30.0–36.0)
MCV: 93.5 fL (ref 78.0–100.0)
Platelets: 739 10*3/uL — ABNORMAL HIGH (ref 150–400)
RBC: 3.07 MIL/uL — ABNORMAL LOW (ref 3.87–5.11)
RDW: 13.8 % (ref 11.5–15.5)
WBC: 13.9 10*3/uL — ABNORMAL HIGH (ref 4.0–10.5)

## 2015-12-24 LAB — URINALYSIS, DIPSTICK ONLY
Bilirubin Urine: NEGATIVE
Glucose, UA: NEGATIVE mg/dL
Ketones, ur: NEGATIVE mg/dL
LEUKOCYTES UA: NEGATIVE
NITRITE: NEGATIVE
PROTEIN: NEGATIVE mg/dL
Specific Gravity, Urine: 1.011 (ref 1.005–1.030)
pH: 6.5 (ref 5.0–8.0)

## 2015-12-24 LAB — HEPARIN LEVEL (UNFRACTIONATED)
HEPARIN UNFRACTIONATED: 0.42 [IU]/mL (ref 0.30–0.70)
HEPARIN UNFRACTIONATED: 0.15 [IU]/mL — AB (ref 0.30–0.70)

## 2015-12-24 MED ORDER — PANTOPRAZOLE SODIUM 40 MG PO TBEC
40.0000 mg | DELAYED_RELEASE_TABLET | Freq: Every day | ORAL | Status: DC
  Administered 2015-12-24 – 2015-12-27 (×4): 40 mg via ORAL
  Filled 2015-12-24 (×4): qty 1

## 2015-12-24 MED ORDER — FENTANYL CITRATE (PF) 100 MCG/2ML IJ SOLN
INTRAMUSCULAR | Status: AC
  Filled 2015-12-24: qty 2

## 2015-12-24 MED ORDER — FENTANYL CITRATE (PF) 100 MCG/2ML IJ SOLN
INTRAMUSCULAR | Status: AC | PRN
  Administered 2015-12-24: 12.5 ug via INTRAVENOUS
  Administered 2015-12-24: 25 ug via INTRAVENOUS
  Administered 2015-12-24: 12.5 ug via INTRAVENOUS

## 2015-12-24 MED ORDER — ALUM & MAG HYDROXIDE-SIMETH 200-200-20 MG/5ML PO SUSP
30.0000 mL | Freq: Four times a day (QID) | ORAL | Status: DC | PRN
  Administered 2015-12-24: 30 mL via ORAL
  Filled 2015-12-24: qty 30

## 2015-12-24 MED ORDER — LIDOCAINE HCL 1 % IJ SOLN
INTRAMUSCULAR | Status: AC
  Filled 2015-12-24: qty 20

## 2015-12-24 MED ORDER — HEPARIN (PORCINE) IN NACL 100-0.45 UNIT/ML-% IJ SOLN
900.0000 [IU]/h | INTRAMUSCULAR | Status: DC
  Administered 2015-12-25: 850 [IU]/h via INTRAVENOUS
  Filled 2015-12-24 (×2): qty 250

## 2015-12-24 MED ORDER — SODIUM CHLORIDE 0.9 % IV SOLN
INTRAVENOUS | Status: AC | PRN
  Administered 2015-12-24: 10 mL/h via INTRAVENOUS

## 2015-12-24 MED ORDER — ENSURE ENLIVE PO LIQD: 237.0000 mL | Freq: Two times a day (BID) | ORAL | Status: DC

## 2015-12-24 MED ORDER — BOOST / RESOURCE BREEZE PO LIQD
1.0000 | Freq: Three times a day (TID) | ORAL | Status: DC
  Administered 2015-12-25 (×2): 1 via ORAL

## 2015-12-24 MED ORDER — MIDAZOLAM HCL 2 MG/2ML IJ SOLN
INTRAMUSCULAR | Status: AC
  Filled 2015-12-24: qty 4

## 2015-12-24 MED ORDER — MIDAZOLAM HCL 2 MG/2ML IJ SOLN
INTRAMUSCULAR | Status: AC | PRN
  Administered 2015-12-24: 1 mg via INTRAVENOUS
  Administered 2015-12-24 (×2): 0.5 mg via INTRAVENOUS

## 2015-12-24 NOTE — Progress Notes (Addendum)
Subjective: Feeling better after IV fluids and some pain medication.    Objective: Vital signs in last 24 hours: Temp:  [98.3 F (36.8 C)-99.2 F (37.3 C)] 99.2 F (37.3 C) (11/03 0500) Pulse Rate:  [64-83] 64 (11/03 0500) Resp:  [17-20] 17 (11/03 0500) BP: (110-148)/(62-88) 110/62 (11/03 0500) SpO2:  [90 %-100 %] 100 % (11/03 0500) Weight:  [51.5 kg (113 lb 9.6 oz)] 51.5 kg (113 lb 9.6 oz) (11/02 1704)    Intake/Output from previous day: 11/02 0701 - 11/03 0700 In: 982.8 [P.O.:120; I.V.:762.8; IV Piggyback:100] Out: 300 [Urine:300] Intake/Output this shift: Total I/O In: 0  Out: 250 [Urine:250]  General appearance: alert, cooperative and no distress Resp: breathing comfortably GI: soft, non distended, mild tenderness LUQ  Lab Results:   Recent Labs  12/23/15 1813 12/24/15 0328  WBC 17.1* 13.9*  HGB 10.2* 9.4*  HCT 31.6* 28.7*  PLT 883* 739*   BMET  Recent Labs  12/23/15 1813 12/24/15 0328  NA 135 136  K 4.0 4.0  CL 103 107  CO2 22 21*  GLUCOSE 151* 158*  BUN 5* <5*  CREATININE 0.82 0.82  CALCIUM 9.4 8.8*   PT/INR  Recent Labs  12/23/15 1813  LABPROT 15.4*  INR 1.21   ABG No results for input(s): PHART, HCO3 in the last 72 hours.  Invalid input(s): PCO2, PO2  Studies/Results: Ct Abdomen Pelvis W Contrast  Addendum Date: 12/23/2015   ADDENDUM REPORT: 12/23/2015 15:55 ADDENDUM: Acute findings discussed with and reconfirmed by Dr.Kerman Pfost on 12/23/2015 at 3:50 pm. Electronically Signed   By: Elon Alas M.D.   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 Now with severe LEFT upper quadrant pain, assess for abscess. History of colectomy for diverticulitis, appendectomy, kidney transplant. EXAM: CT ABDOMEN AND PELVIS WITH CONTRAST TECHNIQUE: Multidetector CT imaging of the abdomen and pelvis was performed using the standard protocol following bolus administration of  intravenous contrast. CONTRAST:  15mL ISOVUE-300 IOPAMIDOL (ISOVUE-300) INJECTION 61% COMPARISON:  CT abdomen Jul 16, 2014 FINDINGS: LOWER CHEST: Bibasilar bullous changes, chronic interstitial changes. Heart size is mildly enlarged. Severe coronary artery calcifications. No pericardial effusions. HEPATOBILIARY: Liver and gallbladder are normal. PANCREAS: Interval partial pancreatectomy. 5.2 x 5.6 cm focal fluid collection with rim enhancement within the pancreatic body, at the level the surgical bed. Collection is contiguous with ill-defined expansile fluid collection LEFT abdomen with small amount of subcutaneous gas. Residual pancreatic head and proximal body are normal. SPLEEN: Heterogeneous geographic enhancement of the spleen on early phase, though this may be secondary to bolus timing and is incompletely imaged on the delayed phase. Lack of normal contrast opacification of the splenic hilar vessels. ADRENALS/URINARY TRACT: Atrophic native kidneys. Stable 12 mm LEFT upper pole cyst. Stable 6 mm dense lesion. LEFT pelvic renal transplant with normal enhancement, similar dilated LEFT interpolar calyx without hydronephrosis, nephrolithiasis or renal masses. Normal contrast excretion on delayed phase. Urinary bladder is well distended and unremarkable. Normal adrenal glands. STOMACH/BOWEL: Mass effect on the stomach due to LEFT abdomen fluid collection. The stomach, small and large bowel are normal in course and caliber without inflammatory changes. Normal appendix. VASCULAR/LYMPHATIC: Central thrombosis superior mesenteric vein. Severe calcific atherosclerosis, 3 cm infrarenal aortic aneurysm. Chronic infrarenal aortic dissection and aneurysm, with increasing RIGHT intramural hematoma, aneurysm is now 4.2 cm in transaxial dimension, previously 3.5 cm in 2015 and 3.8 cm in 2016. REPRODUCTIVE: Normal. OTHER: None. MUSCULOSKELETAL: Nonacute. Anterior abdominal wall scarring at  site of recent surgery without focal  fluid collection. LEFT anterior abdominal wall scarring. Severe degenerative change of LEFT hip. Osteopenia. Grade 2 L5-S1 anterolisthesis without spondylolysis. IMPRESSION: 5.2 x 5.6 cm seroma versus abscess within pancreatic body surgical bed. Free fluid about the pancreatic tail surgical bed, given local mass-effect this is concerning for phlegmon. Acute thrombosis superior mesenteric vein. Heterogeneous enhancement of the spleen with lack of normal splenic hilar vessels concerning for splenic vascular compromise and infarcts. Alternatively this could for represent early splenic infection. Enlarging 4.2 cm infrarenal aortic aneurysm. Recommend followup by ultrasound in 1 year. This recommendation follows ACR consensus guidelines: White Paper of the ACR Incidental Findings Committee II on Vascular Findings. J Am Coll Radiol 2013; 10:789-794. Electronically Signed: By: Elon Alas M.D. On: 12/23/2015 15:41    Anti-infectives: Anti-infectives    Start     Dose/Rate Route Frequency Ordered Stop   12/23/15 2200  trimethoprim (TRIMPEX) tablet 100 mg     100 mg Oral Daily at bedtime 12/23/15 2009     12/23/15 1800  piperacillin-tazobactam (ZOSYN) IVPB 3.375 g     3.375 g 12.5 mL/hr over 240 Minutes Intravenous Every 8 hours 12/23/15 1739        Assessment/Plan: s/p * No surgery found * s/p distal pancreatectomy wtih spleen preservation via short gastrics\  LUQ fluid collection Dehydration S/p kidney transplant SMV thrombosis  IV antibiotics empirically IR consult for perc aspiration/drainage IV fluids Continue immunosuppressants. Heparin gtt.  On hold this am for presumed IR procedure.  Discussed with Dr. Anselm Pancoast yesterday evening.     LOS: 1 day    [facility] 12/24/2015

## 2015-12-24 NOTE — Procedures (Signed)
Post op pancreas collection with fever  S/p CT abdominal drain 10 fr  220cc milky thin fld aspirated  No comp Gs/cx sent Full report in PACS

## 2015-12-24 NOTE — Consult Note (Signed)
   [facility] Inpatient Consult   12/24/2015  Morgan Cooper Jun 27, 1945 [id number]  Patient screened for potential [facility] services for re-admission.  Patient is eligible for [facility] Care Management services under patient's Medicare plan.  Chart reviewed and reveals per MD notes that this patient is status post a distal pancreatectomy by Dr. Barry Dienes on October 17. She was discharged about a week ago. She has continued to have left upper quadrant abdominal pain since surgery.  A CT scan of the abdomen and pelvis showed a fluid collection around the pancreas and an SMV thrombosis. No care management needs noted at this current time.   Please place a [facility] Care Management consult or for questions contact:   Natividad Brood, RN BSN [facility] Liaison  [phone] business mobile phone Toll free office [phone]

## 2015-12-24 NOTE — Progress Notes (Signed)
Initial Nutrition Assessment  DOCUMENTATION CODES:   Severe malnutrition in context of chronic illness  INTERVENTION:   -Continue Boost Breeze po TID, each supplement provides 250 kcal and 9 grams of protein -RD will follow for diet advancement and alter supplement regimen as appropriate  NUTRITION DIAGNOSIS:   Malnutrition related to chronic illness as evidenced by percent weight loss, severe depletion of body fat, energy intake < or equal to 75% for > or equal to 1 month.  GOAL:   Patient will meet greater than or equal to 90% of their needs  MONITOR:   PO intake, Supplement acceptance, Diet advancement, Labs, Weight trends, Skin, I & O's  REASON FOR ASSESSMENT:   Malnutrition Screening Tool    ASSESSMENT:   This patient is status post a distal pancreatectomy by Dr. Barry Dienes on October 17. She was discharged about a week ago. She has continued to have left upper quadrant abdominal pain since surgery. She has had subjective fever. The pain is described as sharp. She has no nausea or vomiting. Her appetite is been marginal. She is doing her bowels. A CT scan of the abdomen and pelvis today showed a fluid collection around the pancreas and an SMV thrombosis.  Pt admitted with postoperative pancreatic leak versus abscess with SMV thrombosis.   Case discussed with RN. Pt is currently NPO, scheduled for drain placement this afternoon.   Hx obtained from pt and daughter at bedside. Both report very poor appetite over the past month. Pt reports she has been consuming mostly a liquid diet, consisting of soft drinks, a few bites of pudding/yogurt/oatmeal, and one can of Ensure daily. Pt shares that she actually feels very hungry today, which is very different from how she's been feeling.   Pt reports UBW is around 120#. Per wt records, pt has experienced a 7.3% wt loss over the past months, which is significant for time frame. She reports she is typically petite framed but has noticed  muscle loss. Nutrition-Focused physical exam completed. Findings are moderate fat depletion, moderate to severe muscle depletion, and no edema.   Pt amenable to supplements once diet is advanced. Also discussed ways to increase calories and protein in diet. Pt is also amenable to try Ensure Plus at home for increased nutrient provision.   Labs reviewed.   Diet Order:  Diet clear liquid Room service appropriate? Yes; Fluid consistency: Thin  Skin:  Reviewed, no issues (closed abdominal incision)  Last BM:  PTA  Height:   Ht Readings from Last 1 Encounters:  12/07/15 5\' 5"  (1.651 m)    Weight:   Wt Readings from Last 1 Encounters:  12/23/15 113 lb 9.6 oz (51.5 kg)    Ideal Body Weight:  56.8 kg  BMI:  Body mass index is 18.9 kg/m.  Estimated Nutritional Needs:   Kcal:  1500-1700  Protein:  70-85 grams  Fluid:  1.5-1.7 L  EDUCATION NEEDS:   Education needs addressed  Bienvenido Proehl A. Jimmye Norman, RD, LDN, CDE Pager: [phone] After hours Pager: [phone]

## 2015-12-24 NOTE — Progress Notes (Signed)
ANTICOAGULATION CONSULT NOTE - Follow-up Consult  Pharmacy Consult for Heparin Indication: SMV thrombus  Allergies  Allergen Reactions  . Amoxicillin Diarrhea    Has patient had a PCN reaction causing immediate rash, facial/tongue/throat swelling, SOB or lightheadedness with hypotension: No Has patient had a PCN reaction causing severe rash involving mucus membranes or skin necrosis: No Has patient had a PCN reaction that required hospitalization No Has patient had a PCN reaction occurring within the last 10 years: Yes If all of the above answers are "NO", then may proceed with Cephalosporin use.   . Tape Rash    Patient Measurements: Weight: 113 lb 9.6 oz (51.5 kg) Heparin Dosing Weight:  51.5 kg  Vital Signs: Temp: 98.7 F (37.1 C) (11/03 1330) Temp Source: Oral (11/03 1330) BP: 103/40 (11/03 1515) Pulse Rate: 53 (11/03 1515)  Labs:  Recent Labs  12/23/15 1813 12/24/15 0328 12/24/15 0940  HGB 10.2* 9.4*  --   HCT 31.6* 28.7*  --   PLT 883* 739*  --   LABPROT 15.4*  --   --   INR 1.21  --   --   HEPARINUNFRC  --  0.42 0.15*  CREATININE 0.82 0.82  --     Estimated Creatinine Clearance: 51.2 mL/min (by C-G formula based on SCr of 0.82 mg/dL).  Assessment: 70 y/o F directly admitted from MD s/p distal pancreatectomy 10/17. On IV heparin for SMV thrombus seen on abdominal CT 11/2. Heparin stopped this am at 0917 for IR procedure. Confirmatory HL collected 30 minutes after heparin off this am so it was low at 0.15. Discussed with Dr. Reesa Chew, will plan to restart heparin at 1700. Hgb is 9.4, plts elevated at 739. No s/s of bleed.  Goal of Therapy:  Heparin level 0.3-0.7 units/ml Monitor platelets by anticoagulation protocol: Yes   Plan:  Restart heparin gtt at 850 units/hr at 1700 Check 8 hr HL Monitor daily HL, CBC, s/s of bleed  Elenor Quinones, PharmD, [facility] Clinical Pharmacist Pager [phone] 12/24/2015 3:28 PM

## 2015-12-24 NOTE — Progress Notes (Signed)
[facility]  Patient Status: Active (receiving services up to time of hospitalization)  [facility] is providing the following services: PT  If patient discharges after hours, please call [phone].   [name] 12/24/2015, 4:48 PM

## 2015-12-24 NOTE — Progress Notes (Signed)
Pt  Transported to IR 

## 2015-12-24 NOTE — Consult Note (Signed)
Chief Complaint: Patient was seen in consultation today for left abd seroma aspiration/drain at the request of Dr Stark Klein  Referring Physician(s): Stark Klein  Supervising Physician: Daryll Brod  Patient Status: [facility] - In-pt  History of Present Illness: Morgan Cooper is a 70 y.o. female   Pancreatic surgery 12/07/15 s/p distal pancreatectomy wtih spleen preservation via short gastrics Increased abd pain; fever; hi wbc CT 11/2: IMPRESSION: 5.2 x 5.6 cm seroma versus abscess within pancreatic body surgical bed. Free fluid about the pancreatic tail surgical bed, given local mass-effect this is concerning for phlegmon.  Request for seroma aspiration/drain placement per Dr Barry Dienes Imaging reviewed with Dr Lyndel Pleasure procedure  Past Medical History:  Diagnosis Date  . Anxiety   . Arthritis    hips, legs, hand   . Barrett's esophagus   . Bone pain   . Bruises easily   . Cancer ([place])    skin cancer that has been removed- L hand  . CHF (congestive heart failure) ([place])   . Chills   . CVA (cerebral infarction)    Mini Strokes   . Depression   . Disturbed concentration   . Diverticulitis of large intestine with perforation Hx colectomy  . Esophageal stricture   . Fatigue   . Forgetfulness   . Gastroparesis   . Generalized headaches   . GERD (gastroesophageal reflux disease)   . Heart failure, diastolic, chronic ([place])   . History of hiatal hernia   . Hx of cardiovascular stress test    Lexiscan Myoview (06/2013):  No ischemia, EF 72%; Low Risk  . Hyperlipemia   . Hypertension   . Irritable   . Itch    skin  . Keratosis   . Macular degeneration    left eye  . Nausea    little vomitting  . Osteoporosis   . Pancreatic cyst   . Pancreatic mass   . Panic attacks   . Persistent headaches   . Pulmonary hypertension   . Rapid heart rate    some times  . Renal failure    transplant - 1983,  followed by Dr. Justin Mend   . Sleep apnea    uses at bedtime-  2liters/min , negative- apnea., last study- 2/017  . Sleep difficulties   . Stroke ([place])   . TIA (transient ischemic attack)     Past Surgical History:  Procedure Laterality Date  . APPENDECTOMY    . cataract surgery bilateral    . COLON RESECTION  2010   Colostomy  . COLOSTOMY  2011   Reversal   . colostomy reversal    . ESOPHAGEAL DILATION    . KIDNEY TRANSPLANT  1984  . left arm surgery     due to injury, two plates in place   . PANCREATECTOMY N/A 12/07/2015   Procedure: HAND ASSISTED LAPAROSCOPIC DISTAL PANCREATECTOMY;  Surgeon: Stark Klein, MD;  Location: [place];  Service: General;  Laterality: N/A;  . PARATHYROIDECTOMY  11/04/2010   left inferior parathyroid    Allergies: Amoxicillin and Tape  Medications: Prior to Admission medications   Medication Sig Start Date End Date Taking? Authorizing Provider  aspirin 81 MG chewable tablet Chew 81 mg by mouth daily.    Historical Provider, MD  atorvastatin (LIPITOR) 20 MG tablet Take 20 mg by mouth daily.     Historical Provider, MD  azaTHIOprine (IMURAN) 50 MG tablet Take 50 mg by mouth every morning.     Historical Provider, MD  azithromycin (ZITHROMAX) 250 MG tablet Take 2 tablets by mouth today, then take 1 tablet daily until gone Patient not taking: Reported on 11/29/2015 08/11/15   [name], MD  cycloSPORINE (RESTASIS) 0.05 % ophthalmic emulsion Place 1 drop into both eyes 2 (two) times daily.      Historical Provider, MD  FLUoxetine (PROZAC) 20 MG capsule Take 60 mg by mouth daily.     Historical Provider, MD  furosemide (LASIX) 20 MG tablet TAKE ONE OR TWO TABLETS DAILY Patient taking differently: Take 20 mgs twice daily as needed for swelling 02/09/15   Jettie Booze, MD  HYDROcodone-acetaminophen [facility]) 10-325 MG per tablet Take 1 tablet by mouth every 6 (six) hours as needed. For pain    Historical Provider, MD  LORazepam (ATIVAN) 1 MG tablet Take 1 tablet (1 mg total) by mouth every 8 (eight)  hours. Patient not taking: Reported on 11/29/2015 08/09/15   Mauri Pole, MD  metoprolol (LOPRESSOR) 50 MG tablet Take 1 tablet (50 mg total) by mouth 2 (two) times daily. Please call and schedule a six month follow up appointment with Dr Irish Lack per last office visit 06/16/15   Jettie Booze, MD  Multiple Vitamins-Minerals (PRESERVISION AREDS 2) CAPS Take 2 capsules by mouth 2 (two) times daily.    Historical Provider, MD  omeprazole (PRILOSEC) 40 MG capsule Take 40 mg by mouth daily.    Historical Provider, MD  ondansetron (ZOFRAN) 4 MG tablet Take 1 tablet (4 mg total) by mouth every 8 (eight) hours as needed for nausea or vomiting. 06/02/15   Mauri Pole, MD  OXYGEN Inhale 1 L/min into the lungs at bedtime. And with activity if needed    Historical Provider, MD  predniSONE (DELTASONE) 10 MG tablet Take 20 mg daily for 3 days, then 10 mg daily for 3 days then go back to your usual dose of 7.5 mg daily Patient not taking: Reported on 11/29/2015 08/11/15   [name], MD  predniSONE (DELTASONE) 5 MG tablet Take 7.5 mg daily 05/04/15   Historical Provider, MD  Probiotic CAPS Take 1 capsule by mouth daily.    Historical Provider, MD  QUEtiapine (SEROQUEL) 200 MG tablet Take 200 mg by mouth at bedtime.     Historical Provider, MD  Tiotropium Bromide Monohydrate (SPIRIVA RESPIMAT) 2.5 MCG/ACT AERS Inhale 2 puffs into the lungs daily. Reported on 08/11/2015 Patient not taking: Reported on 11/29/2015 08/12/15   [name], MD  trimethoprim (TRIMPEX) 100 MG tablet Take 100 mg by mouth at bedtime.  09/06/15   Historical Provider, MD     Family History  Problem Relation Age of Onset  . Lung cancer Sister   . Heart disease Mother   . Stroke Father   . Aneurysm Brother   . Colon cancer Maternal Aunt 34  . Cancer    . Esophageal cancer Neg Hx   . Rectal cancer Neg Hx   . Stomach cancer Neg Hx     Social History   Social History  . Marital status: Widowed    Spouse name: N/A   . Number of children: 2  . Years of education: 12   Occupational History  . Retired    Social History Main Topics  . Smoking status: Former Smoker    Packs/day: 1.00    Years: 5.00    Types: Cigarettes    Quit date: 05/30/1989  . Smokeless tobacco: Never Used  . Alcohol use No  . Drug  use: No  . Sexual activity: Not Asked   Other Topics Concern  . None   Social History Narrative   Patient is widowed with 2 children   Patient is right handed   Patient has a high school education   Patient drinks 3 cups daily          Review of Systems: A 12 point ROS discussed and pertinent positives are indicated in the HPI above.  All other systems are negative.  Review of Systems  Constitutional: Positive for activity change, appetite change, fatigue and fever.  Cardiovascular: Negative for chest pain.  Gastrointestinal: Positive for abdominal pain.  Neurological: Positive for weakness.  Psychiatric/Behavioral: Negative for behavioral problems and confusion.    Vital Signs: BP (!) 121/58   Pulse 61   Temp 99.2 F (37.3 C) (Oral)   Resp 17   Wt 113 lb 9.6 oz (51.5 kg)   SpO2 100%   BMI 18.90 kg/m   Physical Exam  Constitutional: She is oriented to person, place, and time. She appears well-developed and well-nourished.  Neck: Normal range of motion. Neck supple.  Cardiovascular: Normal rate, regular rhythm and normal heart sounds.   Pulmonary/Chest: Effort normal and breath sounds normal. No respiratory distress. She has no wheezes.  Abdominal: Soft. Bowel sounds are normal. She exhibits no distension. There is tenderness (LUQ). There is no guarding.  Musculoskeletal: Normal range of motion.  Neurological: She is alert and oriented to person, place, and time.  Skin: Skin is warm and dry. She is not diaphoretic.  Psychiatric: She has a normal mood and affect. Her behavior is normal. Judgment and thought content normal.  Nursing note and vitals reviewed.   Mallampati  Score:  MD Evaluation Airway: WNL Heart: WNL Abdomen: WNL Chest/ Lungs: WNL ASA  Classification: 3 Mallampati/Airway Score: Two  Imaging: Ct Abdomen Pelvis W Contrast  Addendum Date: 12/23/2015   ADDENDUM REPORT: 12/23/2015 15:55 ADDENDUM: Acute findings discussed with and reconfirmed by Dr.FAERA BYERLY on 12/23/2015 at 3:50 pm. Electronically Signed   By: Elon Alas M.D.   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 Now with severe LEFT upper quadrant pain, assess for abscess. History of colectomy for diverticulitis, appendectomy, kidney transplant. EXAM: CT ABDOMEN AND PELVIS WITH CONTRAST TECHNIQUE: Multidetector CT imaging of the abdomen and pelvis was performed using the standard protocol following bolus administration of intravenous contrast. CONTRAST:  167mL ISOVUE-300 IOPAMIDOL (ISOVUE-300) INJECTION 61% COMPARISON:  CT abdomen Jul 16, 2014 FINDINGS: LOWER CHEST: Bibasilar bullous changes, chronic interstitial changes. Heart size is mildly enlarged. Severe coronary artery calcifications. No pericardial effusions. HEPATOBILIARY: Liver and gallbladder are normal. PANCREAS: Interval partial pancreatectomy. 5.2 x 5.6 cm focal fluid collection with rim enhancement within the pancreatic body, at the level the surgical bed. Collection is contiguous with ill-defined expansile fluid collection LEFT abdomen with small amount of subcutaneous gas. Residual pancreatic head and proximal body are normal. SPLEEN: Heterogeneous geographic enhancement of the spleen on early phase, though this may be secondary to bolus timing and is incompletely imaged on the delayed phase. Lack of normal contrast opacification of the splenic hilar vessels. ADRENALS/URINARY TRACT: Atrophic native kidneys. Stable 12 mm LEFT upper pole cyst. Stable 6 mm dense lesion. LEFT pelvic renal transplant with normal enhancement, similar dilated LEFT interpolar calyx  without hydronephrosis, nephrolithiasis or renal masses. Normal contrast excretion on delayed phase. Urinary bladder is well distended and unremarkable. Normal adrenal glands. STOMACH/BOWEL: Mass effect  on the stomach due to LEFT abdomen fluid collection. The stomach, small and large bowel are normal in course and caliber without inflammatory changes. Normal appendix. VASCULAR/LYMPHATIC: Central thrombosis superior mesenteric vein. Severe calcific atherosclerosis, 3 cm infrarenal aortic aneurysm. Chronic infrarenal aortic dissection and aneurysm, with increasing RIGHT intramural hematoma, aneurysm is now 4.2 cm in transaxial dimension, previously 3.5 cm in 2015 and 3.8 cm in 2016. REPRODUCTIVE: Normal. OTHER: None. MUSCULOSKELETAL: Nonacute. Anterior abdominal wall scarring at site of recent surgery without focal fluid collection. LEFT anterior abdominal wall scarring. Severe degenerative change of LEFT hip. Osteopenia. Grade 2 L5-S1 anterolisthesis without spondylolysis. IMPRESSION: 5.2 x 5.6 cm seroma versus abscess within pancreatic body surgical bed. Free fluid about the pancreatic tail surgical bed, given local mass-effect this is concerning for phlegmon. Acute thrombosis superior mesenteric vein. Heterogeneous enhancement of the spleen with lack of normal splenic hilar vessels concerning for splenic vascular compromise and infarcts. Alternatively this could for represent early splenic infection. Enlarging 4.2 cm infrarenal aortic aneurysm. Recommend followup by ultrasound in 1 year. This recommendation follows ACR consensus guidelines: White Paper of the ACR Incidental Findings Committee II on Vascular Findings. J Am Coll Radiol 2013; 10:789-794. Electronically Signed: By: Elon Alas M.D. On: 12/23/2015 15:41    Labs:  CBC:  Recent Labs  12/11/15 0354 12/13/15 0832 12/23/15 1813 12/24/15 0328  WBC 9.9 13.9* 17.1* 13.9*  HGB 8.4* 9.0* 10.2* 9.4*  HCT 24.8* 27.0* 31.6* 28.7*  PLT 253 382  883* 739*    COAGS:  Recent Labs  12/23/15 1813  INR 1.21    BMP:  Recent Labs  12/11/15 0354 12/13/15 0832 12/23/15 1813 12/24/15 0328  NA 136 136 135 136  K 3.4* 3.8 4.0 4.0  CL 105 102 103 107  CO2 25 27 22  21*  GLUCOSE 122* 113* 151* 158*  BUN 8 8 5* <5*  CALCIUM 8.5* 8.7* 9.4 8.8*  CREATININE 0.65 0.69 0.82 0.82  GFRNONAA >60 >60 >60 >60  GFRAA >60 >60 >60 >60    LIVER FUNCTION TESTS:  Recent Labs  01/19/15 1154 11/30/15 1558 12/23/15 1813  BILITOT 0.4 0.7 0.3  AST 14 18 25   ALT 9 11* 23  ALKPHOS 54 54 92  PROT 7.0 7.8 6.7  ALBUMIN 4.0 4.1 2.9*    TUMOR MARKERS: No results for input(s): AFPTM, CEA, CA199, CHROMGRNA in the last 8760 hours.  Assessment and Plan:  Pancreatic surgery 12/07/15 Post surgical seroma Now for aspiration/drain placement Risks and Benefits discussed with the patient including bleeding, infection, damage to adjacent structures, bowel perforation/fistula connection, and sepsis. All of the patient's questions were answered, patient is agreeable to proceed. Consent signed and in chart.   Thank you for this interesting consult.  I greatly enjoyed meeting Morgan Cooper and look forward to participating in their care.  A copy of this report was sent to the requesting provider on this date.  Electronically Signed: Onyekachi Gathright A 12/24/2015, 10:49 AM   I spent a total of 40 Minutes    in face to face in clinical consultation, greater than 50% of which was counseling/coordinating care for seroma asp/drain

## 2015-12-24 NOTE — Progress Notes (Signed)
ANTICOAGULATION CONSULT NOTE - Follow-up Consult  Pharmacy Consult for Heparin Indication: SMV thrombus  Allergies  Allergen Reactions  . Amoxicillin Diarrhea    Has patient had a PCN reaction causing immediate rash, facial/tongue/throat swelling, SOB or lightheadedness with hypotension: No Has patient had a PCN reaction causing severe rash involving mucus membranes or skin necrosis: No Has patient had a PCN reaction that required hospitalization No Has patient had a PCN reaction occurring within the last 10 years: Yes If all of the above answers are "NO", then may proceed with Cephalosporin use.   . Tape Rash    Patient Measurements: Weight: 113 lb 9.6 oz (51.5 kg) Heparin Dosing Weight:  51.5 kg  Vital Signs: Temp: 98.3 F (36.8 C) (11/02 2100) Temp Source: Oral (11/02 2100) BP: 148/88 (11/02 2100) Pulse Rate: 80 (11/02 2100)  Labs:  Recent Labs  12/23/15 1813 12/24/15 0328  HGB 10.2* 9.4*  HCT 31.6* 28.7*  PLT 883* 739*  LABPROT 15.4*  --   INR 1.21  --   HEPARINUNFRC  --  0.42  CREATININE 0.82  --     Estimated Creatinine Clearance: 51.2 mL/min (by C-G formula based on SCr of 0.82 mg/dL).  Assessment: 70 y/o F directly admitted from [facility] s/p distal pancreatectomy 10/17. Patient c/o severe L upper quadrant pain.  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 Heparin level therapeutic (0.42) on gtt at 850 units/hr. Hgb down a bit, plt elevated.  Goal of Therapy:  Heparin level 0.3-0.7 units/ml Monitor platelets by anticoagulation protocol: Yes   Plan:  Continue heparin infusion at 850 units/hr Check confirmatory heparin level in 6 hours Daily HL and CBC  Sherlon Handing, PharmD, BCPS Clinical pharmacist, pager [phone] 12/24/2015,4:19 AM

## 2015-12-25 DIAGNOSIS — E43 Unspecified severe protein-calorie malnutrition: Secondary | ICD-10-CM | POA: Insufficient documentation

## 2015-12-25 LAB — CBC
HCT: 29.7 % — ABNORMAL LOW (ref 36.0–46.0)
HEMOGLOBIN: 9.6 g/dL — AB (ref 12.0–15.0)
MCH: 30.4 pg (ref 26.0–34.0)
MCHC: 32.3 g/dL (ref 30.0–36.0)
MCV: 94 fL (ref 78.0–100.0)
PLATELETS: 721 10*3/uL — AB (ref 150–400)
RBC: 3.16 MIL/uL — ABNORMAL LOW (ref 3.87–5.11)
RDW: 13.8 % (ref 11.5–15.5)
WBC: 11.9 10*3/uL — ABNORMAL HIGH (ref 4.0–10.5)

## 2015-12-25 LAB — HEPARIN LEVEL (UNFRACTIONATED)
HEPARIN UNFRACTIONATED: 0.27 [IU]/mL — AB (ref 0.30–0.70)
HEPARIN UNFRACTIONATED: 0.22 [IU]/mL — AB (ref 0.30–0.70)
Heparin Unfractionated: 0.39 IU/mL (ref 0.30–0.70)

## 2015-12-25 MED ORDER — WARFARIN - PHARMACIST DOSING INPATIENT
Freq: Every day | Status: DC
  Administered 2015-12-25: 18:00:00

## 2015-12-25 MED ORDER — HEPARIN (PORCINE) IN NACL 100-0.45 UNIT/ML-% IJ SOLN
1000.0000 [IU]/h | INTRAMUSCULAR | Status: DC
  Administered 2015-12-26: 1000 [IU]/h via INTRAVENOUS
  Filled 2015-12-25: qty 250

## 2015-12-25 MED ORDER — WARFARIN VIDEO
Freq: Once | Status: AC
  Administered 2015-12-25: 09:00:00

## 2015-12-25 MED ORDER — COUMADIN BOOK
Freq: Once | Status: AC
  Administered 2015-12-25: 12:00:00
  Filled 2015-12-25: qty 1

## 2015-12-25 MED ORDER — PROMETHAZINE HCL 25 MG/ML IJ SOLN: 12.5000 mg | Freq: Four times a day (QID) | INTRAMUSCULAR | Status: DC | PRN

## 2015-12-25 MED ORDER — ONDANSETRON HCL 4 MG/2ML IJ SOLN
4.0000 mg | Freq: Four times a day (QID) | INTRAMUSCULAR | Status: DC | PRN
  Administered 2015-12-25: 4 mg via INTRAVENOUS
  Filled 2015-12-25: qty 2

## 2015-12-25 MED ORDER — WARFARIN SODIUM 5 MG PO TABS
5.0000 mg | ORAL_TABLET | Freq: Once | ORAL | Status: AC
  Administered 2015-12-25: 5 mg via ORAL
  Filled 2015-12-25: qty 1

## 2015-12-25 NOTE — Discharge Instructions (Addendum)
Hold preventative antibiotic Trimethoprim (Trimpex) while taking Bactrim (sulfamethoxazole/trimethoprim).  Resume trimpex once off the higher dose medication.     Information on my medicine - Coumadin   (Warfarin)  This medication education was reviewed with me or my healthcare representative as part of my discharge preparation.  The pharmacist that spoke with me during my hospital stay was:  Saundra Shelling, [facility]  Why was Coumadin prescribed for you? Coumadin was prescribed for you because you have a blood clot or a medical condition that can cause an increased risk of forming blood clots. Blood clots can cause serious health problems by blocking the flow of blood to the heart, lung, or brain. Coumadin can prevent harmful blood clots from forming. As a reminder your indication for Coumadin is:   Blood Clot Prevention After Heart Pump Surgery  What test will check on my response to Coumadin? While on Coumadin (warfarin) you will need to have an INR test regularly to ensure that your dose is keeping you in the desired range. The INR (international normalized ratio) number is calculated from the result of the laboratory test called prothrombin time (PT).  If an INR APPOINTMENT HAS NOT ALREADY BEEN MADE FOR YOU please schedule an appointment to have this lab work done by your health care provider within 7 days. Your INR goal is usually a number between:  2 to 3 or your provider may give you a more narrow range like 2-2.5.   What  do you need to  know  About  COUMADIN? Take Coumadin (warfarin) exactly as prescribed by your healthcare provider about the same time each day.  DO NOT stop taking without talking to the doctor who prescribed the medication.  Stopping without other blood clot prevention medication to take the place of Coumadin may increase your risk of developing a new clot or stroke.  Get refills before you run out.  What do you do if you miss a dose? If you miss a dose, take it as soon  as you remember on the same day then continue your regularly scheduled regimen the next day.  Do not take two doses of Coumadin at the same time.  Important Safety Information A possible side effect of Coumadin (Warfarin) is an increased risk of bleeding. You should call your healthcare provider right away if you experience any of the following: ? Bleeding from an injury or your nose that does not stop. ? Unusual colored urine (red or dark brown) or unusual colored stools (red or black). ? Unusual bruising for unknown reasons. ? A serious fall or if you hit your head (even if there is no bleeding).  Some foods or medicines interact with Coumadin (warfarin) and might alter your response to warfarin. To help avoid this: ? Eat a balanced diet, maintaining a consistent amount of Vitamin K. ? Notify your provider about major diet changes you plan to make. ? Avoid alcohol or limit your intake to 1 drink for women and 2 drinks for men per day. (1 drink is 5 oz. wine, 12 oz. beer, or 1.5 oz. liquor.)  Make sure that ANY health care provider who prescribes medication for you knows that you are taking Coumadin (warfarin).  Also make sure the healthcare provider who is monitoring your Coumadin knows when you have started a new medication including herbals and non-prescription products.  Coumadin (Warfarin)  Major Drug Interactions  Increased Warfarin Effect Decreased Warfarin Effect  Alcohol (large quantities) Antibiotics (esp. Septra/Bactrim, Flagyl, Cipro) Amiodarone (  Cordarone) Aspirin (ASA) Cimetidine (Tagamet) Megestrol (Megace) NSAIDs (ibuprofen, naproxen, etc.) Piroxicam (Feldene) Propafenone (Rythmol SR) Propranolol (Inderal) Isoniazid (INH) Posaconazole (Noxafil) Barbiturates (Phenobarbital) Carbamazepine (Tegretol) Chlordiazepoxide (Librium) Cholestyramine (Questran) Griseofulvin Oral Contraceptives Rifampin Sucralfate (Carafate) Vitamin K   Coumadin (Warfarin) Major Herbal  Interactions  Increased Warfarin Effect Decreased Warfarin Effect  Garlic Ginseng Ginkgo biloba Coenzyme Q10 Green tea St. Johns wort    Coumadin (Warfarin) FOOD Interactions  Eat a consistent number of servings per week of foods HIGH in Vitamin K (1 serving =  cup)  Collards (cooked, or boiled & drained) Kale (cooked, or boiled & drained) Mustard greens (cooked, or boiled & drained) Parsley *serving size only =  cup Spinach (cooked, or boiled & drained) Swiss chard (cooked, or boiled & drained) Turnip greens (cooked, or boiled & drained)  Eat a consistent number of servings per week of foods MEDIUM-HIGH in Vitamin K (1 serving = 1 cup)  Asparagus (cooked, or boiled & drained) Broccoli (cooked, boiled & drained, or raw & chopped) Brussel sprouts (cooked, or boiled & drained) *serving size only =  cup Lettuce, raw (green leaf, endive, romaine) Spinach, raw Turnip greens, raw & chopped   These websites have more information on Coumadin (warfarin):  FailFactory.se; VeganReport.com.au;  Percutaneous Abscess Drain An abscess is a collection of infected fluid inside the body. Your health care provider may decide to remove or drain the infected fluid from the area by placing a thin needle into the abscess. Usually, a small tube is left in place to drain the abscess fluid. The abscess fluid may take a few days to drain. LET [facility] CARE PROVIDER KNOW ABOUT:  Any allergies you have.  All medicines you are taking, including vitamins, herbs, eye drops, creams, and over-the-counter medicines. This includes steroid medicines by mouth or cream.  Previous problems you or members of your family have had with the use of anesthetics.  Any blood disorders you have.  Previous surgeries you have had.  Possibility of pregnancy, if this applies.  Medical conditions you have.  Any history of smoking. RISKS AND COMPLICATIONS Generally, this is a safe procedure.  However, problems can occur and include:   Infection.  Allergic reaction to materials used (such as contrast dye).  Damage to a nearby organ or tissue.  Bleeding.  Blockage of a tube placed to drain the abscess, requiring placement of a new drainage tube.  A need to repeat the procedure.  Failure of the procedure to adequately drain the abscess, requiring an open surgical procedure to do so. BEFORE THE PROCEDURE   Ask your health care provider about:  Changing or stopping your regular medicines. This is especially important if you are taking diabetes medicines or blood thinners.  Taking medicines such as aspirin and ibuprofen. These medicines can thin your blood. Do not take these medicines before your procedure if your health care provider asks you not to.  Your health care provider may do some blood or urine tests. These will help your health care provider learn how well your kidneys and liver are working and how well your blood clots.  Do not eat or drink anything after midnight on the night before the procedure or as directed by your health care provider.  Make arrangements for someone to drive you home after the procedure.  PROCEDURE   An IV tube will be placed in your arm. Medicine will be able to flow directly into your body through this tube.  You will lie on an  X-ray table.  Your heart rate, blood pressure, and breathing will be monitored.   Your oxygen level will also be watched during the procedure. Supplemental oxygen may be given if necessary.  The skin around the area where the drainage tube (catheter) will be placed will be cleaned and numbed.  A small cut (incision) will then be made to insert the drainage tube. The drainage tube will be inserted using X-ray or CT scan to help direct where it should be placed.  The drainage tube will be guided into the abscess to drain the infected fluid.  The drainage tube may stay in place and be connected to a bag  outside your body. It will stay until the fluid has stopped draining and the infection is gone. AFTER THE PROCEDURE  You will be taken to a recovery area where you will stay until the medicines have worn off.  You will stay in bed for several hours.  Your progress will be monitored.   Your blood pressure and pulse will be checked often.   The area of the incision will be checked often.  You may have some pain or feel sick. Tell your health care provider.  As you begin to feel better, you may be given ice, fluids, and food.   When you can walk, drink, eat, and use the bathroom, you may be able to go home.   This information is not intended to replace advice given to you by your health care provider. Make sure you discuss any questions you have with your health care provider.   Document Released: 06/23/2013 Document Reviewed: 06/23/2013 Elsevier Interactive Patient Education [employer].

## 2015-12-25 NOTE — Progress Notes (Signed)
ANTICOAGULATION CONSULT NOTE - Follow Up Consult  Pharmacy Consult:  Heparin / Coumadin Indication:  SMV thrombus  Allergies  Allergen Reactions  . Amoxicillin Diarrhea    Has patient had a PCN reaction causing immediate rash, facial/tongue/throat swelling, SOB or lightheadedness with hypotension: No Has patient had a PCN reaction causing severe rash involving mucus membranes or skin necrosis: No Has patient had a PCN reaction that required hospitalization No Has patient had a PCN reaction occurring within the last 10 years: Yes If all of the above answers are "NO", then may proceed with Cephalosporin use.   . Tape Rash    Patient Measurements: Weight: 113 lb 9.6 oz (51.5 kg) Heparin Dosing Weight:  51 kg  Vital Signs: Temp: 99 F (37.2 C) (11/04 0426) Temp Source: Oral (11/04 0426) BP: 113/59 (11/04 0426) Pulse Rate: 65 (11/04 0426)  Labs:  Recent Labs  12/23/15 1813  12/24/15 0328 12/24/15 0940 12/25/15 0028 12/25/15 0735  HGB 10.2*  --  9.4*  --  9.6*  --   HCT 31.6*  --  28.7*  --  29.7*  --   PLT 883*  --  739*  --  721*  --   LABPROT 15.4*  --   --   --   --   --   INR 1.21  --   --   --   --   --   HEPARINUNFRC  --   < > 0.42 0.15* 0.22* 0.27*  CREATININE 0.82  --  0.82  --   --   --   < > = values in this interval not displayed.  Estimated Creatinine Clearance: 51.2 mL/min (by C-G formula based on SCr of 0.82 mg/dL).     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  Heparin level is slightly below goal; baseline INR low as expected; no bleeding reported.  No issue with heparin infusion per RN.   Goal of Therapy:  INR 2-3 Heparin level 0.3-0.7 units/ml Monitor platelets by anticoagulation protocol: Yes    Plan:  - Increase heparin gtt to 1000 units/hr - Check 8 hr heparin level - Coumadin 5mg  PO today (already ordered) -  Daily heparin level, CBC, INR - F/U resume home meds:  ASA, prednisone   Shenay Torti D. Mina Marble, PharmD, BCPS Pager:  [phone] 12/25/2015, 9:06 AM

## 2015-12-25 NOTE — Progress Notes (Signed)
Called lab. About lipase from body fluid ordered. Personal said she'll add it on.

## 2015-12-25 NOTE — Progress Notes (Signed)
Referring Physician(s): Stark Klein  Supervising Physician: Daryll Brod  Patient Status:  [facility] - In-pt  Chief Complaint:  Large postop abdominal fluid collection with fever, status post distal pancreatectomy  S/P Successful CT-guided left upper quadrant abdominal fluid collection drain insertion (10 [place]) by Dr. Annamaria Boots  Subjective:  Morgan Cooper is doing ok. She does c/o a little soreness at the drain insertion site, but states she is feeling better overall.  Allergies: Amoxicillin and Tape  Medications: Prior to Admission medications   Medication Sig Start Date End Date Taking? Authorizing Provider  atorvastatin (LIPITOR) 20 MG tablet Take 20 mg by mouth daily.    Yes Historical Provider, MD  azaTHIOprine (IMURAN) 50 MG tablet Take 50 mg by mouth daily.    Yes Historical Provider, MD  cycloSPORINE (RESTASIS) 0.05 % ophthalmic emulsion Place 1 drop into both eyes 2 (two) times daily.     Yes Historical Provider, MD  FLUoxetine (PROZAC) 20 MG capsule Take 60 mg by mouth daily.    Yes Historical Provider, MD  HYDROcodone-acetaminophen (NORCO) 10-325 MG per tablet Take 1 tablet by mouth every 6 (six) hours as needed (pain).    Yes Historical Provider, MD  metoprolol (LOPRESSOR) 50 MG tablet Take 1 tablet (50 mg total) by mouth 2 (two) times daily. Please call and schedule a six month follow up appointment with Dr Irish Lack per last office visit Patient taking differently: Take 50 mg by mouth 2 (two) times daily as needed (BP >130/70).  06/16/15  Yes Jettie Booze, MD  omeprazole (PRILOSEC) 40 MG capsule Take 40 mg by mouth daily.   Yes Historical Provider, MD  ondansetron (ZOFRAN) 4 MG tablet Take 1 tablet (4 mg total) by mouth every 8 (eight) hours as needed for nausea or vomiting. 06/02/15  Yes Mauri Pole, MD  OVER THE COUNTER MEDICATION Take 2 tablets by mouth 2 (two) times daily. OTC eye vitamin without zinc   Yes Historical Provider, MD  OXYGEN Inhale 1 L into the  lungs See admin instructions. Use at bedtime and as needed during activity   Yes Historical Provider, MD  predniSONE (DELTASONE) 5 MG tablet Take 7.5 mg by mouth daily with breakfast.  05/04/15  Yes Historical Provider, MD  Probiotic CAPS Take 1 capsule by mouth daily.   Yes Historical Provider, MD  QUEtiapine (SEROQUEL) 200 MG tablet Take 200 mg by mouth at bedtime.    Yes Historical Provider, MD  trimethoprim (TRIMPEX) 100 MG tablet Take 100 mg by mouth at bedtime. continuous 09/06/15  Yes Historical Provider, MD  aspirin 81 MG chewable tablet Chew 81 mg by mouth daily.    Historical Provider, MD  furosemide (LASIX) 20 MG tablet TAKE ONE OR TWO TABLETS DAILY Patient not taking: Reported on 12/24/2015 02/09/15   Jettie Booze, MD  LORazepam (ATIVAN) 1 MG tablet Take 1 tablet (1 mg total) by mouth every 8 (eight) hours. Patient not taking: Reported on 12/24/2015 08/09/15   Mauri Pole, MD  Tiotropium Bromide Monohydrate (SPIRIVA RESPIMAT) 2.5 MCG/ACT AERS Inhale 2 puffs into the lungs daily. Reported on 08/11/2015 Patient not taking: Reported on 12/24/2015 08/12/15   [name], MD     Vital Signs: BP (!) 113/59 (BP Location: Left Arm)   Pulse 65   Temp 99 F (37.2 C) (Oral)   Resp 17   Wt 113 lb 9.6 oz (51.5 kg)   SpO2 93%   BMI 18.90 kg/m   Physical Exam Awake and  alert Lying in bed. NAD Abdomen soft, NT Drain in place left flank Purulent tan drainage in gravity bag ~510 ml output since placement Site looks good. No erythema.  Imaging: Ct Abdomen Pelvis W Contrast  Addendum Date: 12/23/2015   ADDENDUM REPORT: 12/23/2015 15:55 ADDENDUM: Acute findings discussed with and reconfirmed by Dr.FAERA BYERLY on 12/23/2015 at 3:50 pm. Electronically Signed   By: Elon Alas M.D.   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 Now with severe LEFT upper quadrant pain, assess for abscess. History of colectomy  for diverticulitis, appendectomy, kidney transplant. EXAM: CT ABDOMEN AND PELVIS WITH CONTRAST TECHNIQUE: Multidetector CT imaging of the abdomen and pelvis was performed using the standard protocol following bolus administration of intravenous contrast. CONTRAST:  148mL ISOVUE-300 IOPAMIDOL (ISOVUE-300) INJECTION 61% COMPARISON:  CT abdomen Jul 16, 2014 FINDINGS: LOWER CHEST: Bibasilar bullous changes, chronic interstitial changes. Heart size is mildly enlarged. Severe coronary artery calcifications. No pericardial effusions. HEPATOBILIARY: Liver and gallbladder are normal. PANCREAS: Interval partial pancreatectomy. 5.2 x 5.6 cm focal fluid collection with rim enhancement within the pancreatic body, at the level the surgical bed. Collection is contiguous with ill-defined expansile fluid collection LEFT abdomen with small amount of subcutaneous gas. Residual pancreatic head and proximal body are normal. SPLEEN: Heterogeneous geographic enhancement of the spleen on early phase, though this may be secondary to bolus timing and is incompletely imaged on the delayed phase. Lack of normal contrast opacification of the splenic hilar vessels. ADRENALS/URINARY TRACT: Atrophic native kidneys. Stable 12 mm LEFT upper pole cyst. Stable 6 mm dense lesion. LEFT pelvic renal transplant with normal enhancement, similar dilated LEFT interpolar calyx without hydronephrosis, nephrolithiasis or renal masses. Normal contrast excretion on delayed phase. Urinary bladder is well distended and unremarkable. Normal adrenal glands. STOMACH/BOWEL: Mass effect on the stomach due to LEFT abdomen fluid collection. The stomach, small and large bowel are normal in course and caliber without inflammatory changes. Normal appendix. VASCULAR/LYMPHATIC: Central thrombosis superior mesenteric vein. Severe calcific atherosclerosis, 3 cm infrarenal aortic aneurysm. Chronic infrarenal aortic dissection and aneurysm, with increasing RIGHT intramural  hematoma, aneurysm is now 4.2 cm in transaxial dimension, previously 3.5 cm in 2015 and 3.8 cm in 2016. REPRODUCTIVE: Normal. OTHER: None. MUSCULOSKELETAL: Nonacute. Anterior abdominal wall scarring at site of recent surgery without focal fluid collection. LEFT anterior abdominal wall scarring. Severe degenerative change of LEFT hip. Osteopenia. Grade 2 L5-S1 anterolisthesis without spondylolysis. IMPRESSION: 5.2 x 5.6 cm seroma versus abscess within pancreatic body surgical bed. Free fluid about the pancreatic tail surgical bed, given local mass-effect this is concerning for phlegmon. Acute thrombosis superior mesenteric vein. Heterogeneous enhancement of the spleen with lack of normal splenic hilar vessels concerning for splenic vascular compromise and infarcts. Alternatively this could for represent early splenic infection. Enlarging 4.2 cm infrarenal aortic aneurysm. Recommend followup by ultrasound in 1 year. This recommendation follows ACR consensus guidelines: White Paper of the ACR Incidental Findings Committee II on Vascular Findings. J Am Coll Radiol 2013; 10:789-794. Electronically Signed: By: Elon Alas M.D. On: 12/23/2015 15:41   Ct Image Guided Fluid Drain By Catheter  Result Date: 12/24/2015 INDICATION: Large postop abdominal fluid collection with fever, status post distal pancreatectomy EXAM: CT IMAGE GUIDED FLUID DRAIN BY CATHETER MEDICATIONS: 1% lidocaine locally ANESTHESIA/SEDATION: Fentanyl 50 mcg IV; Versed 2.0 mg IV Moderate Sedation Time:  16 minutes The patient was continuously monitored during the procedure by the interventional radiology nurse under my direct supervision.  COMPLICATIONS: None immediate. PROCEDURE: Informed written consent was obtained from the patient after a thorough discussion of the procedural risks, benefits and alternatives. All questions were addressed. Maximal Sterile Barrier Technique was utilized including caps, mask, sterile gowns, sterile gloves,  sterile drape, hand hygiene and skin antiseptic. A timeout was performed prior to the initiation of the procedure. Previous imaging reviewed. Patient positioned slightly left anterior oblique. Noncontrast localization CT performed. The large complex left upper quadrant abdominal fluid collection was localized. Under sterile conditions and local anesthesia, an 18 gauge 15 cm access needle was advanced percutaneously into the central aspect of the fluid collection close to the pancreatic surgical site. Needle position confirmed with CT. Syringe aspiration yielded milky fluid. Guidewire inserted followed by tract dilatation to advance a 10 [place] drain. Drain catheter position confirmed with CT. 220 cc milky fluid aspirated. Sample sent for Gram stain and culture. Catheter connected to external gravity drainage. Sterile dressing applied. No immediate complication. Patient tolerated the procedure well. IMPRESSION: Successful CT-guided left upper quadrant abdominal fluid collection drain insertion (10 [place]) Electronically Signed   By: Jerilynn Mages.  Shick M.D.   On: 12/24/2015 15:49    Labs:  CBC:  Recent Labs  12/13/15 0832 12/23/15 1813 12/24/15 0328 12/25/15 0028  WBC 13.9* 17.1* 13.9* 11.9*  HGB 9.0* 10.2* 9.4* 9.6*  HCT 27.0* 31.6* 28.7* 29.7*  PLT 382 883* 739* 721*    COAGS:  Recent Labs  12/23/15 1813  INR 1.21    BMP:  Recent Labs  12/11/15 0354 12/13/15 0832 12/23/15 1813 12/24/15 0328  NA 136 136 135 136  K 3.4* 3.8 4.0 4.0  CL 105 102 103 107  CO2 25 27 22  21*  GLUCOSE 122* 113* 151* 158*  BUN 8 8 5* <5*  CALCIUM 8.5* 8.7* 9.4 8.8*  CREATININE 0.65 0.69 0.82 0.82  GFRNONAA >60 >60 >60 >60  GFRAA >60 >60 >60 >60    LIVER FUNCTION TESTS:  Recent Labs  01/19/15 1154 11/30/15 1558 12/23/15 1813  BILITOT 0.4 0.7 0.3  AST 14 18 25   ALT 9 11* 23  ALKPHOS 54 54 92  PROT 7.0 7.8 6.7  ALBUMIN 4.0 4.1 2.9*    Assessment and Plan:  Large postop abdominal fluid  collection with fever, status post distal pancreatectomy  S/P Successful CT-guided left upper quadrant abdominal fluid collection drain insertion (10 [place]) by Dr. Annamaria Boots  Routine drain care, flushes as ordered = 5 ml saline TID.  Empty and record output q shift.  Repeat CT scan when output significantly decreased  Will follow  Electronically Signed: Gaylynn Seiple S Antonin Meininger PA-C 12/25/2015, 10:39 AM   I spent a total of 15 Minutes at the the patient's bedside AND on the patient's hospital floor or unit, greater than 50% of which was counseling/coordinating care for f/u after drain placement.

## 2015-12-25 NOTE — Progress Notes (Signed)
Subjective: Continues to feel better.  Less nausea.    Objective: Vital signs in last 24 hours: Temp:  [97.8 F (36.6 C)-99 F (37.2 C)] 99 F (37.2 C) (11/04 0426) Pulse Rate:  [53-76] 65 (11/04 0426) Resp:  [15-21] 17 (11/04 0426) BP: (103-133)/(40-65) 113/59 (11/04 0426) SpO2:  [93 %-100 %] 93 % (11/04 0426)    Intake/Output from previous day: 11/03 0701 - 11/04 0700 In: 1638.7 [P.O.:250; I.V.:1388.7] Out: 2910 [Urine:2400; Drains:510] Intake/Output this shift: No intake/output data recorded.  General appearance: alert, cooperative and no distress Resp: breathing comfortably GI: soft, non distended, less tenderness.  Milky yellow drain output.  Lab Results:   Recent Labs  12/24/15 0328 12/25/15 0028  WBC 13.9* 11.9*  HGB 9.4* 9.6*  HCT 28.7* 29.7*  PLT 739* 721*   BMET  Recent Labs  12/23/15 1813 12/24/15 0328  NA 135 136  K 4.0 4.0  CL 103 107  CO2 22 21*  GLUCOSE 151* 158*  BUN 5* <5*  CREATININE 0.82 0.82  CALCIUM 9.4 8.8*   PT/INR  Recent Labs  12/23/15 1813  LABPROT 15.4*  INR 1.21   ABG No results for input(s): PHART, HCO3 in the last 72 hours.  Invalid input(s): PCO2, PO2  Studies/Results: Ct Abdomen Pelvis W Contrast  Addendum Date: 12/23/2015   ADDENDUM REPORT: 12/23/2015 15:55 ADDENDUM: Acute findings discussed with and reconfirmed by Dr.Azile Minardi on 12/23/2015 at 3:50 pm. Electronically Signed   By: Elon Alas M.D.   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 Now with severe LEFT upper quadrant pain, assess for abscess. History of colectomy for diverticulitis, appendectomy, kidney transplant. EXAM: CT ABDOMEN AND PELVIS WITH CONTRAST TECHNIQUE: Multidetector CT imaging of the abdomen and pelvis was performed using the standard protocol following bolus administration of intravenous contrast. CONTRAST:  161mL ISOVUE-300 IOPAMIDOL (ISOVUE-300) INJECTION 61%  COMPARISON:  CT abdomen Jul 16, 2014 FINDINGS: LOWER CHEST: Bibasilar bullous changes, chronic interstitial changes. Heart size is mildly enlarged. Severe coronary artery calcifications. No pericardial effusions. HEPATOBILIARY: Liver and gallbladder are normal. PANCREAS: Interval partial pancreatectomy. 5.2 x 5.6 cm focal fluid collection with rim enhancement within the pancreatic body, at the level the surgical bed. Collection is contiguous with ill-defined expansile fluid collection LEFT abdomen with small amount of subcutaneous gas. Residual pancreatic head and proximal body are normal. SPLEEN: Heterogeneous geographic enhancement of the spleen on early phase, though this may be secondary to bolus timing and is incompletely imaged on the delayed phase. Lack of normal contrast opacification of the splenic hilar vessels. ADRENALS/URINARY TRACT: Atrophic native kidneys. Stable 12 mm LEFT upper pole cyst. Stable 6 mm dense lesion. LEFT pelvic renal transplant with normal enhancement, similar dilated LEFT interpolar calyx without hydronephrosis, nephrolithiasis or renal masses. Normal contrast excretion on delayed phase. Urinary bladder is well distended and unremarkable. Normal adrenal glands. STOMACH/BOWEL: Mass effect on the stomach due to LEFT abdomen fluid collection. The stomach, small and large bowel are normal in course and caliber without inflammatory changes. Normal appendix. VASCULAR/LYMPHATIC: Central thrombosis superior mesenteric vein. Severe calcific atherosclerosis, 3 cm infrarenal aortic aneurysm. Chronic infrarenal aortic dissection and aneurysm, with increasing RIGHT intramural hematoma, aneurysm is now 4.2 cm in transaxial dimension, previously 3.5 cm in 2015 and 3.8 cm in 2016. REPRODUCTIVE: Normal. OTHER: None. MUSCULOSKELETAL: Nonacute. Anterior abdominal wall scarring at site of recent surgery without focal fluid collection. LEFT anterior abdominal wall scarring. Severe degenerative change of  LEFT  hip. Osteopenia. Grade 2 L5-S1 anterolisthesis without spondylolysis. IMPRESSION: 5.2 x 5.6 cm seroma versus abscess within pancreatic body surgical bed. Free fluid about the pancreatic tail surgical bed, given local mass-effect this is concerning for phlegmon. Acute thrombosis superior mesenteric vein. Heterogeneous enhancement of the spleen with lack of normal splenic hilar vessels concerning for splenic vascular compromise and infarcts. Alternatively this could for represent early splenic infection. Enlarging 4.2 cm infrarenal aortic aneurysm. Recommend followup by ultrasound in 1 year. This recommendation follows ACR consensus guidelines: White Paper of the ACR Incidental Findings Committee II on Vascular Findings. J Am Coll Radiol 2013; 10:789-794. Electronically Signed: By: Elon Alas M.D. On: 12/23/2015 15:41   Ct Image Guided Fluid Drain By Catheter  Result Date: 12/24/2015 INDICATION: Large postop abdominal fluid collection with fever, status post distal pancreatectomy EXAM: CT IMAGE GUIDED FLUID DRAIN BY CATHETER MEDICATIONS: 1% lidocaine locally ANESTHESIA/SEDATION: Fentanyl 50 mcg IV; Versed 2.0 mg IV Moderate Sedation Time:  16 minutes The patient was continuously monitored during the procedure by the interventional radiology nurse under my direct supervision. COMPLICATIONS: None immediate. PROCEDURE: Informed written consent was obtained from the patient after a thorough discussion of the procedural risks, benefits and alternatives. All questions were addressed. Maximal Sterile Barrier Technique was utilized including caps, mask, sterile gowns, sterile gloves, sterile drape, hand hygiene and skin antiseptic. A timeout was performed prior to the initiation of the procedure. Previous imaging reviewed. Patient positioned slightly left anterior oblique. Noncontrast localization CT performed. The large complex left upper quadrant abdominal fluid collection was localized. Under sterile  conditions and local anesthesia, an 18 gauge 15 cm access needle was advanced percutaneously into the central aspect of the fluid collection close to the pancreatic surgical site. Needle position confirmed with CT. Syringe aspiration yielded milky fluid. Guidewire inserted followed by tract dilatation to advance a 10 [place] drain. Drain catheter position confirmed with CT. 220 cc milky fluid aspirated. Sample sent for Gram stain and culture. Catheter connected to external gravity drainage. Sterile dressing applied. No immediate complication. Patient tolerated the procedure well. IMPRESSION: Successful CT-guided left upper quadrant abdominal fluid collection drain insertion (10 [place]) Electronically Signed   By: Jerilynn Mages.  Shick M.D.   On: 12/24/2015 15:49    Anti-infectives: Anti-infectives    Start     Dose/Rate Route Frequency Ordered Stop   12/23/15 2200  trimethoprim (TRIMPEX) tablet 100 mg     100 mg Oral Daily at bedtime 12/23/15 2009     12/23/15 1800  piperacillin-tazobactam (ZOSYN) IVPB 3.375 g     3.375 g 12.5 mL/hr over 240 Minutes Intravenous Every 8 hours 12/23/15 1739        Assessment/Plan: s/p * No surgery found * s/p distal pancreatectomy wtih spleen preservation via short gastrics\  LUQ fluid collection Dehydration S/p kidney transplant SMV thrombosis  IV antibiotics empirically Drain  IV fluids Continue immunosuppressants. Heparin gtt.  Resumed. Coumadin to start today.  Advance diet as tolerated.       LOS: 2 days    [facility] 12/25/2015

## 2015-12-25 NOTE — Progress Notes (Signed)
ANTICOAGULATION CONSULT NOTE - Initial Consult  Pharmacy Consult for Coumadin Indication: SMV thrombus  Allergies  Allergen Reactions  . Amoxicillin Diarrhea    Has patient had a PCN reaction causing immediate rash, facial/tongue/throat swelling, SOB or lightheadedness with hypotension: No Has patient had a PCN reaction causing severe rash involving mucus membranes or skin necrosis: No Has patient had a PCN reaction that required hospitalization No Has patient had a PCN reaction occurring within the last 10 years: Yes If all of the above answers are "NO", then may proceed with Cephalosporin use.   . Tape Rash    Patient Measurements: Weight: 113 lb 9.6 oz (51.5 kg)  Vital Signs: Temp: 99 F (37.2 C) (11/04 0426) Temp Source: Oral (11/04 0426) BP: 113/59 (11/04 0426) Pulse Rate: 65 (11/04 0426)  Labs:  Recent Labs  12/23/15 1813 12/24/15 0328 12/24/15 0940 12/25/15 0028  HGB 10.2* 9.4*  --  9.6*  HCT 31.6* 28.7*  --  29.7*  PLT 883* 739*  --  721*  LABPROT 15.4*  --   --   --   INR 1.21  --   --   --   HEPARINUNFRC  --  0.42 0.15* 0.22*  CREATININE 0.82 0.82  --   --     Estimated Creatinine Clearance: 51.2 mL/min (by C-G formula based on SCr of 0.82 mg/dL).   Medical History: Past Medical History:  Diagnosis Date  . Anxiety   . Arthritis    hips, legs, hand   . Barrett's esophagus   . Bone pain   . Bruises easily   . Cancer ([place])    skin cancer that has been removed- L hand  . CHF (congestive heart failure) ([place])   . Chills   . CVA (cerebral infarction)    Mini Strokes   . Depression   . Disturbed concentration   . Diverticulitis of large intestine with perforation Hx colectomy  . Esophageal stricture   . Fatigue   . Forgetfulness   . Gastroparesis   . Generalized headaches   . GERD (gastroesophageal reflux disease)   . Heart failure, diastolic, chronic ([place])   . History of hiatal hernia   . Hx of cardiovascular stress test    Lexiscan Myoview  (06/2013):  No ischemia, EF 72%; Low Risk  . Hyperlipemia   . Hypertension   . Irritable   . Itch    skin  . Keratosis   . Macular degeneration    left eye  . Nausea    little vomitting  . Osteoporosis   . Pancreatic cyst   . Pancreatic mass   . Panic attacks   . Persistent headaches   . Pulmonary hypertension   . Rapid heart rate    some times  . Renal failure    transplant - 1983,  followed by Dr. Justin Mend   . Sleep apnea    uses at bedtime- 2liters/min , negative- apnea., last study- 2/017  . Sleep difficulties   . Stroke ([place])   . TIA (transient ischemic attack)     Medications:  Prescriptions Prior to Admission  Medication Sig Dispense Refill Last Dose  . atorvastatin (LIPITOR) 20 MG tablet Take 20 mg by mouth daily.    11/1 or 11/2  . azaTHIOprine (IMURAN) 50 MG tablet Take 50 mg by mouth daily.    12/23/2015 at Unknown time  . cycloSPORINE (RESTASIS) 0.05 % ophthalmic emulsion Place 1 drop into both eyes 2 (two) times daily.  12/23/2015 at am  . FLUoxetine (PROZAC) 20 MG capsule Take 60 mg by mouth daily.    12/23/2015 at Unknown time  . HYDROcodone-acetaminophen (NORCO) 10-325 MG per tablet Take 1 tablet by mouth every 6 (six) hours as needed (pain).    12/23/2015 at am  . metoprolol (LOPRESSOR) 50 MG tablet Take 1 tablet (50 mg total) by mouth 2 (two) times daily. Please call and schedule a six month follow up appointment with Dr Irish Lack per last office visit (Patient taking differently: Take 50 mg by mouth 2 (two) times daily as needed (BP >130/70). ) 60 tablet 6 12/21/2015 at 2000-2100  . omeprazole (PRILOSEC) 40 MG capsule Take 40 mg by mouth daily.   12/23/2015 at Unknown time  . ondansetron (ZOFRAN) 4 MG tablet Take 1 tablet (4 mg total) by mouth every 8 (eight) hours as needed for nausea or vomiting. 30 tablet 1 12/23/2015 at Unknown time  . OVER THE COUNTER MEDICATION Take 2 tablets by mouth 2 (two) times daily. OTC eye vitamin without zinc   few days ago  . OXYGEN  Inhale 1 L into the lungs See admin instructions. Use at bedtime and as needed during activity   12/22/2015 at Unknown time  . predniSONE (DELTASONE) 5 MG tablet Take 7.5 mg by mouth daily with breakfast.    12/23/2015 at Unknown time  . Probiotic CAPS Take 1 capsule by mouth daily.   few weeks ago  . QUEtiapine (SEROQUEL) 200 MG tablet Take 200 mg by mouth at bedtime.    12/22/2015  . trimethoprim (TRIMPEX) 100 MG tablet Take 100 mg by mouth at bedtime. continuous   12/22/2015  . aspirin 81 MG chewable tablet Chew 81 mg by mouth daily.   prior to 10/17 procedure  . furosemide (LASIX) 20 MG tablet TAKE ONE OR TWO TABLETS DAILY (Patient not taking: Reported on 12/24/2015) 30 tablet 6 Not Taking at Unknown time  . LORazepam (ATIVAN) 1 MG tablet Take 1 tablet (1 mg total) by mouth every 8 (eight) hours. (Patient not taking: Reported on 12/24/2015) 2 tablet 0 Not Taking at Unknown time  . Tiotropium Bromide Monohydrate (SPIRIVA RESPIMAT) 2.5 MCG/ACT AERS Inhale 2 puffs into the lungs daily. Reported on 08/11/2015 (Patient not taking: Reported on 12/24/2015) 1 Inhaler 5 Not Taking at Unknown time   Scheduled:  . atorvastatin  20 mg Oral q1800  . azaTHIOprine  50 mg Oral BH-q7a  . cycloSPORINE  1 drop Both Eyes BID  . feeding supplement  1 Container Oral TID BM  . FLUoxetine  60 mg Oral Daily  . metoprolol  50 mg Oral BID  . pantoprazole  40 mg Oral Q1200  . piperacillin-tazobactam (ZOSYN)  IV  3.375 g Intravenous Q8H  . QUEtiapine  200 mg Oral QHS  . trimethoprim  100 mg Oral QHS   Infusions:  . dextrose 5 % and 0.45 % NaCl with KCl 10 mEq/L 100 mL/hr at 12/24/15 1817  . heparin 900 Units/hr (12/25/15 0135)    Assessment: 70yo female has been on heparin gtt for SMV thrombus, now to begin transition to oral anticoag w/ Coumadin.  Goal of Therapy:  INR 2-3   Plan:  Will give Coumadin 5mg  po x1 today and monitor INR for dose adjustments; begin Coumadin education.  Wynona Neat, PharmD, BCPS   12/25/2015,7:32 AM

## 2015-12-25 NOTE — Progress Notes (Signed)
ANTICOAGULATION CONSULT NOTE - Follow Up Consult  Pharmacy Consult:  Heparin Indication:  SMV thrombus  Allergies  Allergen Reactions  . Amoxicillin Diarrhea    Has patient had a PCN reaction causing immediate rash, facial/tongue/throat swelling, SOB or lightheadedness with hypotension: No Has patient had a PCN reaction causing severe rash involving mucus membranes or skin necrosis: No Has patient had a PCN reaction that required hospitalization No Has patient had a PCN reaction occurring within the last 10 years: Yes If all of the above answers are "NO", then may proceed with Cephalosporin use.   . Tape Rash    Patient Measurements: Weight: 113 lb 9.6 oz (51.5 kg) Heparin Dosing Weight:  51 kg  Vital Signs: Temp: 99.1 F (37.3 C) (11/04 1300) Temp Source: Oral (11/04 1300) BP: 124/46 (11/04 1300) Pulse Rate: 60 (11/04 1300)  Labs:  Recent Labs  12/23/15 1813 12/24/15 0328  12/25/15 0028 12/25/15 0735 12/25/15 1820  HGB 10.2* 9.4*  --  9.6*  --   --   HCT 31.6* 28.7*  --  29.7*  --   --   PLT 883* 739*  --  721*  --   --   LABPROT 15.4*  --   --   --   --   --   INR 1.21  --   --   --   --   --   HEPARINUNFRC  --  0.42  < > 0.22* 0.27* 0.39  CREATININE 0.82 0.82  --   --   --   --   < > = values in this interval not displayed.  Estimated Creatinine Clearance: 51.2 mL/min (by C-G formula based on SCr of 0.82 mg/dL).     Assessment: 69 YOF directly admitted from MD office s/p distal pancreatectomy 10/17.  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    PM heparin level therapeutic at 0.39  Goal of Therapy:  INR 2-3 Heparin level 0.3-0.7 units/ml Monitor platelets by anticoagulation protocol: Yes    Plan:  Continue heparin at 1000 units / hr Follow up AM labs  Thank you Anette Guarneri, PharmD [phone]  12/25/2015, 6:54 PM

## 2015-12-25 NOTE — Progress Notes (Signed)
ANTICOAGULATION CONSULT NOTE - Follow Up Consult  Pharmacy Consult for heparin Indication: SMV thrombus  Labs:  Recent Labs  12/23/15 1813 12/24/15 0328 12/24/15 0940 12/25/15 0028  HGB 10.2* 9.4*  --  9.6*  HCT 31.6* 28.7*  --  29.7*  PLT 883* 739*  --  721*  LABPROT 15.4*  --   --   --   INR 1.21  --   --   --   HEPARINUNFRC  --  0.42 0.15* 0.22*  CREATININE 0.82 0.82  --   --     Assessment: 70yo female subtherapeutic on heparin after resumed; was previously at goal x1 at this rate though only one level obtained and may have been d/t bolus.  Goal of Therapy:  Heparin level 0.3-0.7 units/ml   Plan:  Will increase heparin gtt slightly to 900 units/hr and check level in [place], PharmD, BCPS  12/25/2015,1:35 AM

## 2015-12-26 LAB — HEPARIN LEVEL (UNFRACTIONATED): Heparin Unfractionated: 0.3 IU/mL (ref 0.30–0.70)

## 2015-12-26 LAB — CBC
HEMATOCRIT: 30 % — AB (ref 36.0–46.0)
HEMOGLOBIN: 9.6 g/dL — AB (ref 12.0–15.0)
MCH: 30.5 pg (ref 26.0–34.0)
MCHC: 32 g/dL (ref 30.0–36.0)
MCV: 95.2 fL (ref 78.0–100.0)
Platelets: 649 10*3/uL — ABNORMAL HIGH (ref 150–400)
RBC: 3.15 MIL/uL — AB (ref 3.87–5.11)
RDW: 14 % (ref 11.5–15.5)
WBC: 9.7 10*3/uL (ref 4.0–10.5)

## 2015-12-26 LAB — PROTIME-INR
INR: 1.17
Prothrombin Time: 15 seconds (ref 11.4–15.2)

## 2015-12-26 MED ORDER — HEPARIN (PORCINE) IN NACL 100-0.45 UNIT/ML-% IJ SOLN
1050.0000 [IU]/h | INTRAMUSCULAR | Status: DC
  Administered 2015-12-26 – 2015-12-27 (×2): 1050 [IU]/h via INTRAVENOUS
  Filled 2015-12-26: qty 250

## 2015-12-26 MED ORDER — WARFARIN SODIUM 5 MG PO TABS
5.0000 mg | ORAL_TABLET | Freq: Once | ORAL | Status: AC
Start: 2015-12-26 — End: 2015-12-26
  Administered 2015-12-26: 5 mg via ORAL
  Filled 2015-12-26: qty 1

## 2015-12-26 NOTE — Progress Notes (Signed)
Subjective: Continues to feel better. Wants more than full liquids.  No fevers.     Objective: Vital signs in last 24 hours: Temp:  [98.7 F (37.1 C)-99.1 F (37.3 C)] 98.7 F (37.1 C) (11/05 0600) Pulse Rate:  [54-60] 54 (11/05 0600) Resp:  [17] 17 (11/05 0605) BP: (92-126)/(46-61) 126/61 (11/05 0600) SpO2:  [92 %-97 %] 94 % (11/05 0600) Last BM Date: 12/24/15  Intake/Output from previous day: 11/04 0701 - 11/05 0700 In: 2631.8 [P.O.:360; I.V.:2221.8; IV Piggyback:50] Out: 2845 [Urine:2750; Drains:95] Intake/Output this shift: No intake/output data recorded.  Sitting on edge of bed. General appearance: alert, cooperative and no distress Resp: breathing comfortably GI: soft, non distended, less tenderness.  Milky yellow drain output.  Lab Results:   Recent Labs  12/25/15 0028 12/26/15 0527  WBC 11.9* 9.7  HGB 9.6* 9.6*  HCT 29.7* 30.0*  PLT 721* 649*   BMET  Recent Labs  12/23/15 1813 12/24/15 0328  NA 135 136  K 4.0 4.0  CL 103 107  CO2 22 21*  GLUCOSE 151* 158*  BUN 5* <5*  CREATININE 0.82 0.82  CALCIUM 9.4 8.8*   PT/INR  Recent Labs  12/23/15 1813 12/26/15 0527  LABPROT 15.4* 15.0  INR 1.21 1.17   ABG No results for input(s): PHART, HCO3 in the last 72 hours.  Invalid input(s): PCO2, PO2  Studies/Results: Ct Image Guided Fluid Drain By Catheter  Result Date: 12/24/2015 INDICATION: Large postop abdominal fluid collection with fever, status post distal pancreatectomy EXAM: CT IMAGE GUIDED FLUID DRAIN BY CATHETER MEDICATIONS: 1% lidocaine locally ANESTHESIA/SEDATION: Fentanyl 50 mcg IV; Versed 2.0 mg IV Moderate Sedation Time:  16 minutes The patient was continuously monitored during the procedure by the interventional radiology nurse under my direct supervision. COMPLICATIONS: None immediate. PROCEDURE: Informed written consent was obtained from the patient after a thorough discussion of the procedural risks, benefits and alternatives. All  questions were addressed. Maximal Sterile Barrier Technique was utilized including caps, mask, sterile gowns, sterile gloves, sterile drape, hand hygiene and skin antiseptic. A timeout was performed prior to the initiation of the procedure. Previous imaging reviewed. Patient positioned slightly left anterior oblique. Noncontrast localization CT performed. The large complex left upper quadrant abdominal fluid collection was localized. Under sterile conditions and local anesthesia, an 18 gauge 15 cm access needle was advanced percutaneously into the central aspect of the fluid collection close to the pancreatic surgical site. Needle position confirmed with CT. Syringe aspiration yielded milky fluid. Guidewire inserted followed by tract dilatation to advance a 10 [place] drain. Drain catheter position confirmed with CT. 220 cc milky fluid aspirated. Sample sent for Gram stain and culture. Catheter connected to external gravity drainage. Sterile dressing applied. No immediate complication. Patient tolerated the procedure well. IMPRESSION: Successful CT-guided left upper quadrant abdominal fluid collection drain insertion (10 [place]) Electronically Signed   By: Jerilynn Mages.  Shick M.D.   On: 12/24/2015 15:49    Anti-infectives: Anti-infectives    Start     Dose/Rate Route Frequency Ordered Stop   12/23/15 2200  trimethoprim (TRIMPEX) tablet 100 mg     100 mg Oral Daily at bedtime 12/23/15 2009     12/23/15 1800  piperacillin-tazobactam (ZOSYN) IVPB 3.375 g     3.375 g 12.5 mL/hr over 240 Minutes Intravenous Every 8 hours 12/23/15 1739        Assessment/Plan: s/p * No surgery found * s/p distal pancreatectomy wtih spleen preservation via short gastrics\  LUQ fluid collection Dehydration S/p kidney transplant SMV  thrombosis  IV antibiotics empirically.  Will convert to PO upon discharge.   Drain  IV fluids Continue immunosuppressants. Heparin gtt.  Resumed. Coumadin started.  Consider switch to lovenox for  d/c home to become therapeutic.  Advance diet as tolerated.       LOS: 3 days    [facility] 12/26/2015

## 2015-12-26 NOTE — Progress Notes (Signed)
ANTICOAGULATION CONSULT NOTE - Follow Up Consult  Pharmacy Consult:  Heparin / Coumadin Indication:  SMV thrombus  Allergies  Allergen Reactions  . Amoxicillin Diarrhea    Has patient had a PCN reaction causing immediate rash, facial/tongue/throat swelling, SOB or lightheadedness with hypotension: No Has patient had a PCN reaction causing severe rash involving mucus membranes or skin necrosis: No Has patient had a PCN reaction that required hospitalization No Has patient had a PCN reaction occurring within the last 10 years: Yes If all of the above answers are "NO", then may proceed with Cephalosporin use.   . Tape Rash    Patient Measurements: Weight: 113 lb 9.6 oz (51.5 kg) Heparin Dosing Weight:  51 kg  Vital Signs: Temp: 98.7 F (37.1 C) (11/05 0600) Temp Source: Oral (11/04 2153) BP: 126/61 (11/05 0600) Pulse Rate: 54 (11/05 0600)  Labs:  Recent Labs  12/23/15 1813 12/24/15 0328  12/25/15 0028 12/25/15 0735 12/25/15 1820 12/26/15 0527  HGB 10.2* 9.4*  --  9.6*  --   --  9.6*  HCT 31.6* 28.7*  --  29.7*  --   --  30.0*  PLT 883* 739*  --  721*  --   --  649*  LABPROT 15.4*  --   --   --   --   --  15.0  INR 1.21  --   --   --   --   --  1.17  HEPARINUNFRC  --  0.42  < > 0.22* 0.27* 0.39 0.30  CREATININE 0.82 0.82  --   --   --   --   --   < > = values in this interval not displayed.  Estimated Creatinine Clearance: 51.2 mL/min (by C-G formula based on SCr of 0.82 mg/dL).     Assessment: 70 YOF directly admitted from MD office s/p distal pancreatectomy 10/17.  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  Heparin level is low normal; INR sub-therapeutic as expected; no bleeding reported.     Goal of Therapy:  INR 2-3 Heparin level 0.3-0.7 units/ml Monitor platelets by anticoagulation protocol: Yes    Plan:  - Increase heparin gtt slightly to 1050 units/hr - Repeat Coumadin 5mg  PO today - Daily heparin level,  CBC, INR - F/U resume home meds:  ASA, prednisone - Consider holding TMP (currently on Zosyn)   Charlisa Cham D. Mina Marble, PharmD, BCPS Pager:  [phone] 12/26/2015, 7:50 AM

## 2015-12-27 ENCOUNTER — Other Ambulatory Visit: Payer: Self-pay | Admitting: General Surgery

## 2015-12-27 DIAGNOSIS — K8689 Other specified diseases of pancreas: Secondary | ICD-10-CM

## 2015-12-27 LAB — PROTIME-INR
INR: 1.51
PROTHROMBIN TIME: 18.4 s — AB (ref 11.4–15.2)

## 2015-12-27 LAB — HEPARIN LEVEL (UNFRACTIONATED): Heparin Unfractionated: 0.41 IU/mL (ref 0.30–0.70)

## 2015-12-27 LAB — CBC
HCT: 28.6 % — ABNORMAL LOW (ref 36.0–46.0)
Hemoglobin: 9.2 g/dL — ABNORMAL LOW (ref 12.0–15.0)
MCH: 30.6 pg (ref 26.0–34.0)
MCHC: 32.2 g/dL (ref 30.0–36.0)
MCV: 95 fL (ref 78.0–100.0)
PLATELETS: 596 10*3/uL — AB (ref 150–400)
RBC: 3.01 MIL/uL — ABNORMAL LOW (ref 3.87–5.11)
RDW: 13.9 % (ref 11.5–15.5)
WBC: 7.6 10*3/uL (ref 4.0–10.5)

## 2015-12-27 MED ORDER — WARFARIN SODIUM 2.5 MG PO TABS: 2.5000 mg | ORAL_TABLET | Freq: Every day | ORAL | 0 refills | Status: DC

## 2015-12-27 MED ORDER — SULFAMETHOXAZOLE-TRIMETHOPRIM 800-160 MG PO TABS
2.0000 | ORAL_TABLET | Freq: Two times a day (BID) | ORAL | Status: DC
  Administered 2015-12-27: 2 via ORAL
  Filled 2015-12-27: qty 2

## 2015-12-27 MED ORDER — TRIMETHOPRIM 100 MG PO TABS: 100.0000 mg | ORAL_TABLET | Freq: Every day | ORAL | Status: DC

## 2015-12-27 MED ORDER — SULFAMETHOXAZOLE-TRIMETHOPRIM 800-160 MG PO TABS: 2.0000 | ORAL_TABLET | Freq: Two times a day (BID) | ORAL | 0 refills | Status: DC

## 2015-12-27 NOTE — Care Management Note (Signed)
Case Management Note  Patient Details  Name: Morgan Cooper MRN: [id number] Date of Birth: [birth year]  Subjective/Objective:                    Action/Plan:   Expected Discharge Date:                  Expected Discharge Plan:  [place]  In-House Referral:     Discharge planning Services  CM Consult  Post Acute Care Choice:    Choice offered to:  Patient  DME Arranged:    DME Agency:     HH Arranged:  RN [place] Agency:  [place]  Status of Service:  Completed, signed off  If discussed at [place] of Stay Meetings, dates discussed:    Additional Comments:  Marilu Favre, RN 12/27/2015, 10:21 AM

## 2015-12-27 NOTE — Progress Notes (Signed)
Referring Physician(s): Stark Klein  Supervising Physician: Corrie Mckusick  Patient Status:  Morgan Cooper  Chief Complaint:  pancreatectomy with post abd fluid collection  Subjective:  Drain placed 11/3 Draining well Cx - no growth Pt feeling better daily - plan for discharge today per Dr Barry Dienes  Allergies: Amoxicillin and Tape  Medications: Prior to Admission medications   Medication Sig Start Date End Date Taking? Authorizing Provider  atorvastatin (LIPITOR) 20 MG tablet Take 20 mg by mouth daily.    Yes Historical Provider, MD  azaTHIOprine (IMURAN) 50 MG tablet Take 50 mg by mouth daily.    Yes Historical Provider, MD  cycloSPORINE (RESTASIS) 0.05 % ophthalmic emulsion Place 1 drop into both eyes 2 (two) times daily.     Yes Historical Provider, MD  FLUoxetine (PROZAC) 20 MG capsule Take 60 mg by mouth daily.    Yes Historical Provider, MD  HYDROcodone-acetaminophen (NORCO) 10-325 MG per tablet Take 1 tablet by mouth every 6 (six) hours as needed (pain).    Yes Historical Provider, MD  metoprolol (LOPRESSOR) 50 MG tablet Take 1 tablet (50 mg total) by mouth 2 (two) times daily. Please call and schedule a six month follow up appointment with Dr Irish Lack per last office visit Patient taking differently: Take 50 mg by mouth 2 (two) times daily as needed (BP >130/70).  06/16/15  Yes Jettie Booze, MD  omeprazole (PRILOSEC) 40 MG capsule Take 40 mg by mouth daily.   Yes Historical Provider, MD  ondansetron (ZOFRAN) 4 MG tablet Take 1 tablet (4 mg total) by mouth every 8 (eight) hours as needed for nausea or vomiting. 06/02/15  Yes Mauri Pole, MD  OVER THE COUNTER MEDICATION Take 2 tablets by mouth 2 (two) times daily. OTC eye vitamin without zinc   Yes Historical Provider, MD  OXYGEN Inhale 1 L into the lungs See admin instructions. Use at bedtime and as needed during activity   Yes Historical Provider, MD  predniSONE (DELTASONE) 5 MG tablet Take 7.5 mg by mouth  daily with breakfast.  05/04/15  Yes Historical Provider, MD  Probiotic CAPS Take 1 capsule by mouth daily.   Yes Historical Provider, MD  QUEtiapine (SEROQUEL) 200 MG tablet Take 200 mg by mouth at bedtime.    Yes Historical Provider, MD  aspirin 81 MG chewable tablet Chew 81 mg by mouth daily.    Historical Provider, MD  furosemide (LASIX) 20 MG tablet TAKE ONE OR TWO TABLETS DAILY Patient not taking: Reported on 12/24/2015 02/09/15   Jettie Booze, MD  LORazepam (ATIVAN) 1 MG tablet Take 1 tablet (1 mg total) by mouth every 8 (eight) hours. Patient not taking: Reported on 12/24/2015 08/09/15   Mauri Pole, MD  sulfamethoxazole-trimethoprim (BACTRIM DS,SEPTRA DS) 800-160 MG tablet Take 2 tablets by mouth every 12 (twelve) hours. Hold trimpex while on this antibiotic.  Restart trimpex once this antibiotic is complete. 12/27/15   Stark Klein, MD  Tiotropium Bromide Monohydrate (SPIRIVA RESPIMAT) 2.5 MCG/ACT AERS Inhale 2 puffs into the lungs daily. Reported on 08/11/2015 Patient not taking: Reported on 12/24/2015 08/12/15   [name], MD  trimethoprim (TRIMPEX) 100 MG tablet Take 1 tablet (100 mg total) by mouth at bedtime. continuous 01/06/16   Stark Klein, MD  warfarin (COUMADIN) 2.5 MG tablet Take 1 tablet (2.5 mg total) by mouth daily. Take as directed.  Start one per day. 12/27/15   Stark Klein, MD     Vital Signs: BP 140/66 (  BP Location: Right Arm)   Pulse (!) 58   Temp 99.1 F (37.3 C) (Oral)   Resp 18   Wt 113 lb 9.6 oz (51.5 kg)   SpO2 98%   BMI 18.90 kg/m   Physical Exam  Constitutional: She is oriented to person, place, and time.  Neurological: She is alert and oriented to person, place, and time.  Skin: Skin is warm and dry.  Site of LUQ drain is clean and dry NT no bleeding Output great Cloudy/yellow fluid  No growth  Nursing note and vitals reviewed.   Imaging: Ct Abdomen Pelvis W Contrast  Addendum Date: 12/23/2015   ADDENDUM REPORT: 12/23/2015  15:55 ADDENDUM: Acute findings discussed with and reconfirmed by Dr.FAERA BYERLY on 12/23/2015 at 3:50 pm. Electronically Signed   By: Elon Alas M.D.   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 Now with severe LEFT upper quadrant pain, assess for abscess. History of colectomy for diverticulitis, appendectomy, kidney transplant. EXAM: CT ABDOMEN AND PELVIS WITH CONTRAST TECHNIQUE: Multidetector CT imaging of the abdomen and pelvis was performed using the standard protocol following bolus administration of intravenous contrast. CONTRAST:  113mL ISOVUE-300 IOPAMIDOL (ISOVUE-300) INJECTION 61% COMPARISON:  CT abdomen Jul 16, 2014 FINDINGS: LOWER CHEST: Bibasilar bullous changes, chronic interstitial changes. Heart size is mildly enlarged. Severe coronary artery calcifications. No pericardial effusions. HEPATOBILIARY: Liver and gallbladder are normal. PANCREAS: Interval partial pancreatectomy. 5.2 x 5.6 cm focal fluid collection with rim enhancement within the pancreatic body, at the level the surgical bed. Collection is contiguous with ill-defined expansile fluid collection LEFT abdomen with small amount of subcutaneous gas. Residual pancreatic head and proximal body are normal. SPLEEN: Heterogeneous geographic enhancement of the spleen on early phase, though this may be secondary to bolus timing and is incompletely imaged on the delayed phase. Lack of normal contrast opacification of the splenic hilar vessels. ADRENALS/URINARY TRACT: Atrophic native kidneys. Stable 12 mm LEFT upper pole cyst. Stable 6 mm dense lesion. LEFT pelvic renal transplant with normal enhancement, similar dilated LEFT interpolar calyx without hydronephrosis, nephrolithiasis or renal masses. Normal contrast excretion on delayed phase. Urinary bladder is well distended and unremarkable. Normal adrenal glands. STOMACH/BOWEL: Mass effect on the stomach due to LEFT abdomen  fluid collection. The stomach, small and large bowel are normal in course and caliber without inflammatory changes. Normal appendix. VASCULAR/LYMPHATIC: Central thrombosis superior mesenteric vein. Severe calcific atherosclerosis, 3 cm infrarenal aortic aneurysm. Chronic infrarenal aortic dissection and aneurysm, with increasing RIGHT intramural hematoma, aneurysm is now 4.2 cm in transaxial dimension, previously 3.5 cm in 2015 and 3.8 cm in 2016. REPRODUCTIVE: Normal. OTHER: None. MUSCULOSKELETAL: Nonacute. Anterior abdominal wall scarring at site of recent surgery without focal fluid collection. LEFT anterior abdominal wall scarring. Severe degenerative change of LEFT hip. Osteopenia. Grade 2 L5-S1 anterolisthesis without spondylolysis. IMPRESSION: 5.2 x 5.6 cm seroma versus abscess within pancreatic body surgical bed. Free fluid about the pancreatic tail surgical bed, given local mass-effect this is concerning for phlegmon. Acute thrombosis superior mesenteric vein. Heterogeneous enhancement of the spleen with lack of normal splenic hilar vessels concerning for splenic vascular compromise and infarcts. Alternatively this could for represent early splenic infection. Enlarging 4.2 cm infrarenal aortic aneurysm. Recommend followup by ultrasound in 1 year. This recommendation follows ACR consensus guidelines: White Paper of the ACR Incidental Findings Committee II on Vascular Findings. J Am Coll Radiol 2013; 10:789-794. Electronically Signed: By: Elon Alas M.D. On: 12/23/2015 15:41  Ct Image Guided Fluid Drain By Catheter  Result Date: 12/24/2015 INDICATION: Large postop abdominal fluid collection with fever, status post distal pancreatectomy EXAM: CT IMAGE GUIDED FLUID DRAIN BY CATHETER MEDICATIONS: 1% lidocaine locally ANESTHESIA/SEDATION: Fentanyl 50 mcg IV; Versed 2.0 mg IV Moderate Sedation Time:  16 minutes The patient was continuously monitored during the procedure by the interventional radiology  nurse under my direct supervision. COMPLICATIONS: None immediate. PROCEDURE: Informed written consent was obtained from the patient after a thorough discussion of the procedural risks, benefits and alternatives. All questions were addressed. Maximal Sterile Barrier Technique was utilized including caps, mask, sterile gowns, sterile gloves, sterile drape, hand hygiene and skin antiseptic. A timeout was performed prior to the initiation of the procedure. Previous imaging reviewed. Patient positioned slightly left anterior oblique. Noncontrast localization CT performed. The large complex left upper quadrant abdominal fluid collection was localized. Under sterile conditions and local anesthesia, an 18 gauge 15 cm access needle was advanced percutaneously into the central aspect of the fluid collection close to the pancreatic surgical site. Needle position confirmed with CT. Syringe aspiration yielded milky fluid. Guidewire inserted followed by tract dilatation to advance a 10 [place] drain. Drain catheter position confirmed with CT. 220 cc milky fluid aspirated. Sample sent for Gram stain and culture. Catheter connected to external gravity drainage. Sterile dressing applied. No immediate complication. Patient tolerated the procedure well. IMPRESSION: Successful CT-guided left upper quadrant abdominal fluid collection drain insertion (10 [place]) Electronically Signed   By: Jerilynn Mages.  Shick M.D.   On: 12/24/2015 15:49    Labs:  CBC:  Recent Labs  12/24/15 0328 12/25/15 0028 12/26/15 0527 12/27/15 0152  WBC 13.9* 11.9* 9.7 7.6  HGB 9.4* 9.6* 9.6* 9.2*  HCT 28.7* 29.7* 30.0* 28.6*  PLT 739* 721* 649* 596*    COAGS:  Recent Labs  12/23/15 1813 12/26/15 0527 12/27/15 0152  INR 1.21 1.17 1.51    BMP:  Recent Labs  12/11/15 0354 12/13/15 0832 12/23/15 1813 12/24/15 0328  NA 136 136 135 136  K 3.4* 3.8 4.0 4.0  CL 105 102 103 107  CO2 25 27 22  21*  GLUCOSE 122* 113* 151* 158*  BUN 8 8 5* <5*    CALCIUM 8.5* 8.7* 9.4 8.8*  CREATININE 0.65 0.69 0.82 0.82  GFRNONAA >60 >60 >60 >60  GFRAA >60 >60 >60 >60    LIVER FUNCTION TESTS:  Recent Labs  01/19/15 1154 11/30/15 1558 12/23/15 1813  BILITOT 0.4 0.7 0.3  AST 14 18 25   ALT 9 11* 23  ALKPHOS 54 54 92  PROT 7.0 7.8 6.7  ALBUMIN 4.0 4.1 2.9*    Assessment and Plan:  Pancreatectomy Post abd abscess Drain placed 11/3 Pt will hear from IR drain clinic for follow up Will need to flush with 5cc sterile saline daily at home   Electronically Signed: Samanthajo Payano A 12/27/2015, 10:25 AM   I spent a total of 15 Minutes at the the patient's bedside AND on the patient's hospital floor or unit, greater than 50% of which was counseling/coordinating care for LUQ abscess drain

## 2015-12-27 NOTE — Discharge Summary (Signed)
Physician Discharge Summary  Patient ID: Morgan Cooper MRN: [id number] DOB/AGE: 71-Nov-1947 71 y.o.  Admit date: 12/23/2015 Discharge date: 12/27/2015  Admission Diagnoses: Dehydration LUQ fluid collection S/p distal pancreatectomy  Discharge Diagnoses:  Active Problems:   Dehydration   Protein-calorie malnutrition, severe LUQ fluid collection SMV thrombosis  Discharged Condition: stable  Hospital Course: Pt was directly admitted to the floor following a CT showing a left upper quadrant fluid collection and a SMV thrombosis following a distal pancreatectomy.  She was placed on a heparin gtt, and IR was consulted for percutaneous drainage.  A perc drain was placed, and results from fluid analysis are not yet available, however, gram stain was negative and cultures are negative to date.  She was on IV antibiotics while in house, and coumadin was started.  Her diet was advanced, and her appetite is improving.  Her white count normalized.  Electrolytes were essentially normal.    INR at the time of discharge was 1.5 up from 1.17.    Prophylactic trimethoprim was held while patient was on zosyn.    Anticoagulation discussed with PCP, Dr. Fara Olden.  PT/INR will be drawn on 11/8 and she will be seen 11/9.    Consults: Pharmacy  Significant Diagnostic Studies: labs: see epic  Treatments: IV hydration, antibiotics: Zosyn and perc drain placement  Discharge Exam: Blood pressure 140/66, pulse (!) 58, temperature 99.1 F (37.3 C), temperature source Oral, resp. rate 18, weight 51.5 kg (113 lb 9.6 oz), SpO2 98 %. General appearance: alert, cooperative and no distress Resp: breathing comfortably Cardio: regular rate and rhythm GI: soft, non distended, non tender.  drain output milky yellow  Disposition: 06-Home-Health Care Svc  Discharge Instructions    Call MD for:  persistant nausea and vomiting    Complete by:  As directed    Call MD for:  redness, tenderness, or signs of  infection (pain, swelling, redness, odor or green/yellow discharge around incision site)    Complete by:  As directed    Call MD for:  severe uncontrolled pain    Complete by:  As directed    Call MD for:  temperature >100.4    Complete by:  As directed    Change dressing (specify)    Complete by:  As directed    Measure and record drain output at least once daily.  Bring record to clinic.   Diet - low sodium heart healthy    Complete by:  As directed    Increase activity slowly    Complete by:  As directed        Medication List    TAKE these medications   aspirin 81 MG chewable tablet Chew 81 mg by mouth daily.   atorvastatin 20 MG tablet Commonly known as:  LIPITOR Take 20 mg by mouth daily.   azaTHIOprine 50 MG tablet Commonly known as:  IMURAN Take 50 mg by mouth daily.   cycloSPORINE 0.05 % ophthalmic emulsion Commonly known as:  RESTASIS Place 1 drop into both eyes 2 (two) times daily.   FLUoxetine 20 MG capsule Commonly known as:  PROZAC Take 60 mg by mouth daily.   furosemide 20 MG tablet Commonly known as:  LASIX TAKE ONE OR TWO TABLETS DAILY   HYDROcodone-acetaminophen 10-325 MG tablet Commonly known as:  NORCO Take 1 tablet by mouth every 6 (six) hours as needed (pain).   LORazepam 1 MG tablet Commonly known as:  ATIVAN Take 1 tablet (1 mg total) by mouth every 8 (  eight) hours.   metoprolol 50 MG tablet Commonly known as:  LOPRESSOR Take 1 tablet (50 mg total) by mouth 2 (two) times daily. Please call and schedule a six month follow up appointment with Dr Irish Lack per last office visit What changed:  when to take this  reasons to take this  additional instructions   omeprazole 40 MG capsule Commonly known as:  PRILOSEC Take 40 mg by mouth daily.   ondansetron 4 MG tablet Commonly known as:  ZOFRAN Take 1 tablet (4 mg total) by mouth every 8 (eight) hours as needed for nausea or vomiting.   OVER THE COUNTER MEDICATION Take 2 tablets by  mouth 2 (two) times daily. OTC eye vitamin without zinc   OXYGEN Inhale 1 L into the lungs See admin instructions. Use at bedtime and as needed during activity   predniSONE 5 MG tablet Commonly known as:  DELTASONE Take 7.5 mg by mouth daily with breakfast.   Probiotic Caps Take 1 capsule by mouth daily.   QUEtiapine 200 MG tablet Commonly known as:  SEROQUEL Take 200 mg by mouth at bedtime.   sulfamethoxazole-trimethoprim 800-160 MG tablet Commonly known as:  BACTRIM DS,SEPTRA DS Take 2 tablets by mouth every 12 (twelve) hours. Hold trimpex while on this antibiotic.  Restart trimpex once this antibiotic is complete.   Tiotropium Bromide Monohydrate 2.5 MCG/ACT Aers Commonly known as:  SPIRIVA RESPIMAT Inhale 2 puffs into the lungs daily. Reported on 08/11/2015   trimethoprim 100 MG tablet Commonly known as:  TRIMPEX Take 1 tablet (100 mg total) by mouth at bedtime. continuous Start taking on:  01/06/2016   warfarin 2.5 MG tablet Commonly known as:  COUMADIN Take 1 tablet (2.5 mg total) by mouth daily. Take as directed.  Start one per day.        SignedStark Klein 12/27/2015, 8:18 AM

## 2015-12-28 LAB — CULTURE, BLOOD (ROUTINE X 2)
Culture: NO GROWTH
Culture: NO GROWTH

## 2015-12-29 ENCOUNTER — Encounter: Payer: Self-pay | Admitting: Genetic Counselor

## 2015-12-29 DIAGNOSIS — I5032 Chronic diastolic (congestive) heart failure: Secondary | ICD-10-CM | POA: Diagnosis not present

## 2015-12-29 DIAGNOSIS — Z483 Aftercare following surgery for neoplasm: Secondary | ICD-10-CM | POA: Diagnosis not present

## 2015-12-29 DIAGNOSIS — Z90411 Acquired partial absence of pancreas: Secondary | ICD-10-CM | POA: Diagnosis not present

## 2015-12-29 DIAGNOSIS — J8410 Pulmonary fibrosis, unspecified: Secondary | ICD-10-CM | POA: Diagnosis not present

## 2015-12-29 DIAGNOSIS — I2729 Other secondary pulmonary hypertension: Secondary | ICD-10-CM | POA: Diagnosis not present

## 2015-12-29 DIAGNOSIS — I110 Hypertensive heart disease with heart failure: Secondary | ICD-10-CM | POA: Diagnosis not present

## 2015-12-29 LAB — LIPASE, FLUID

## 2015-12-29 LAB — AEROBIC/ANAEROBIC CULTURE (SURGICAL/DEEP WOUND)
CULTURE: NO GROWTH
GRAM STAIN: NONE SEEN

## 2015-12-30 DIAGNOSIS — N390 Urinary tract infection, site not specified: Secondary | ICD-10-CM | POA: Diagnosis not present

## 2015-12-30 DIAGNOSIS — Z5181 Encounter for therapeutic drug level monitoring: Secondary | ICD-10-CM | POA: Diagnosis not present

## 2015-12-30 DIAGNOSIS — I81 Portal vein thrombosis: Secondary | ICD-10-CM | POA: Diagnosis not present

## 2015-12-30 DIAGNOSIS — D638 Anemia in other chronic diseases classified elsewhere: Secondary | ICD-10-CM | POA: Diagnosis not present

## 2015-12-30 DIAGNOSIS — I10 Essential (primary) hypertension: Secondary | ICD-10-CM | POA: Diagnosis not present

## 2015-12-30 DIAGNOSIS — Z7901 Long term (current) use of anticoagulants: Secondary | ICD-10-CM | POA: Diagnosis not present

## 2015-12-30 DIAGNOSIS — Z940 Kidney transplant status: Secondary | ICD-10-CM | POA: Diagnosis not present

## 2015-12-31 DIAGNOSIS — I110 Hypertensive heart disease with heart failure: Secondary | ICD-10-CM | POA: Diagnosis not present

## 2015-12-31 DIAGNOSIS — J8410 Pulmonary fibrosis, unspecified: Secondary | ICD-10-CM | POA: Diagnosis not present

## 2015-12-31 DIAGNOSIS — Z90411 Acquired partial absence of pancreas: Secondary | ICD-10-CM | POA: Diagnosis not present

## 2015-12-31 DIAGNOSIS — I5032 Chronic diastolic (congestive) heart failure: Secondary | ICD-10-CM | POA: Diagnosis not present

## 2015-12-31 DIAGNOSIS — Z483 Aftercare following surgery for neoplasm: Secondary | ICD-10-CM | POA: Diagnosis not present

## 2015-12-31 DIAGNOSIS — I2729 Other secondary pulmonary hypertension: Secondary | ICD-10-CM | POA: Diagnosis not present

## 2016-01-03 DIAGNOSIS — Z90411 Acquired partial absence of pancreas: Secondary | ICD-10-CM | POA: Diagnosis not present

## 2016-01-03 DIAGNOSIS — I5032 Chronic diastolic (congestive) heart failure: Secondary | ICD-10-CM | POA: Diagnosis not present

## 2016-01-03 DIAGNOSIS — I110 Hypertensive heart disease with heart failure: Secondary | ICD-10-CM | POA: Diagnosis not present

## 2016-01-03 DIAGNOSIS — J8410 Pulmonary fibrosis, unspecified: Secondary | ICD-10-CM | POA: Diagnosis not present

## 2016-01-03 DIAGNOSIS — I2729 Other secondary pulmonary hypertension: Secondary | ICD-10-CM | POA: Diagnosis not present

## 2016-01-03 DIAGNOSIS — Z483 Aftercare following surgery for neoplasm: Secondary | ICD-10-CM | POA: Diagnosis not present

## 2016-01-06 ENCOUNTER — Ambulatory Visit
Admission: RE | Admit: 2016-01-06 | Discharge: 2016-01-06 | Disposition: A | Discharge status: Home / Self Care | Payer: Medicare Other | Source: Ambulatory Visit | Attending: Radiology | Admitting: Radiology

## 2016-01-06 ENCOUNTER — Other Ambulatory Visit (HOSPITAL_COMMUNITY): Payer: Self-pay | Admitting: Interventional Radiology

## 2016-01-06 ENCOUNTER — Ambulatory Visit
Admission: RE | Admit: 2016-01-06 | Discharge: 2016-01-06 | Disposition: A | Discharge status: Home / Self Care | Payer: Medicare Other | Source: Ambulatory Visit | Attending: General Surgery | Admitting: General Surgery

## 2016-01-06 DIAGNOSIS — K8689 Other specified diseases of pancreas: Secondary | ICD-10-CM

## 2016-01-06 DIAGNOSIS — D378 Neoplasm of uncertain behavior of other specified digestive organs: Secondary | ICD-10-CM | POA: Diagnosis not present

## 2016-01-06 DIAGNOSIS — K869 Disease of pancreas, unspecified: Secondary | ICD-10-CM | POA: Diagnosis not present

## 2016-01-06 HISTORY — PX: IR GENERIC HISTORICAL: IMG1180011

## 2016-01-06 MED ORDER — IOPAMIDOL (ISOVUE-300) INJECTION 61%
100.0000 mL | Freq: Once | INTRAVENOUS | Status: AC | PRN
  Administered 2016-01-06: 100 mL via INTRAVENOUS

## 2016-01-06 NOTE — Progress Notes (Signed)
Referring Physician(s): Byerly,F  Chief Complaint: The patient is seen in follow up today s/p CT guided drainage of left upper quadrant abdominal fluid collection on 12/24/15.  History of present illness: Ms. Deist is a 70 year old white female who is status post distal pancreatectomy on 12/07/15 with pathology revealing microcystic serous cystadenoma. On 12/24/15 she underwent drainage of a postop left upper abdominal fluid collection. Also present on the postoperative CT scan was acute SMV thrombosis, and she is currently on Coumadin therapy. Fluid cultures were negative. She was discharged from  [facility] on 12/27/15. She presents today for follow-up CT scan and assessment of her left upper quadrant drain. Since her discharge home the patient has continued to feel weak. Other complaints include mild abdominal/back pain, intermittent nausea/ vomiting, weight loss, diminished appetite, constipation, occasional headache, intermittent chest discomfort as well as dyspnea. She denies fevers or chills. Drain output has averaged between 50-60 mL per day of beige-colored fluid. She is not flushing the drain at home. She will take her last dose of Bactrim today. She is scheduled to meet with Dr. Marlou Starks later this afternoon.   Past Medical History:  Diagnosis Date  . Anxiety   . Arthritis    hips, legs, hand   . Barrett's esophagus   . Bone pain   . Bruises easily   . Cancer ([place])    skin cancer that has been removed- L hand  . CHF (congestive heart failure) ([place])   . Chills   . CVA (cerebral infarction)    Mini Strokes   . Depression   . Disturbed concentration   . Diverticulitis of large intestine with perforation Hx colectomy  . Esophageal stricture   . Fatigue   . Forgetfulness   . Gastroparesis   . Generalized headaches   . GERD (gastroesophageal reflux disease)   . Heart failure, diastolic, chronic ([place])   . History of hiatal hernia   . Hx of cardiovascular stress test    Lexiscan Myoview  (06/2013):  No ischemia, EF 72%; Low Risk  . Hyperlipemia   . Hypertension   . Irritable   . Itch    skin  . Keratosis   . Macular degeneration    left eye  . Nausea    little vomitting  . Osteoporosis   . Pancreatic cyst   . Pancreatic mass   . Panic attacks   . Persistent headaches   . Pulmonary hypertension   . Rapid heart rate    some times  . Renal failure    transplant - 1983,  followed by Dr. Justin Mend   . Sleep apnea    uses at bedtime- 2liters/min , negative- apnea., last study- 2/017  . Sleep difficulties   . Stroke ([place])   . TIA (transient ischemic attack)     Past Surgical History:  Procedure Laterality Date  . APPENDECTOMY    . cataract surgery bilateral    . COLON RESECTION  2010   Colostomy  . COLOSTOMY  2011   Reversal   . colostomy reversal    . ESOPHAGEAL DILATION    . KIDNEY TRANSPLANT  1984  . left arm surgery     due to injury, two plates in place   . PANCREATECTOMY N/A 12/07/2015   Procedure: HAND ASSISTED LAPAROSCOPIC DISTAL PANCREATECTOMY;  Surgeon: Stark Klein, MD;  Location: [facility];  Service: General;  Laterality: N/A;  . PARATHYROIDECTOMY  11/04/2010   left inferior parathyroid    Allergies: Amoxicillin and Tape  Medications: Prior to Admission medications   Medication Sig Start Date End Date Taking? Authorizing Provider  azaTHIOprine (IMURAN) 50 MG tablet Take 50 mg by mouth daily.    Yes Historical Provider, MD  cycloSPORINE (RESTASIS) 0.05 % ophthalmic emulsion Place 1 drop into both eyes 2 (two) times daily.     Yes Historical Provider, MD  diazepam (VALIUM) 2 MG tablet Take 2 mg by mouth daily as needed for anxiety.   Yes Historical Provider, MD  FLUoxetine (PROZAC) 20 MG capsule Take 60 mg by mouth daily.    Yes Historical Provider, MD  HYDROcodone-acetaminophen (NORCO) 10-325 MG per tablet Take 1 tablet by mouth every 6 (six) hours as needed (pain).    Yes Historical Provider, MD  omeprazole (PRILOSEC) 40 MG capsule Take 40 mg by  mouth daily.   Yes Historical Provider, MD  ondansetron (ZOFRAN) 4 MG tablet Take 1 tablet (4 mg total) by mouth every 8 (eight) hours as needed for nausea or vomiting. 06/02/15  Yes Mauri Pole, MD  OVER THE COUNTER MEDICATION Take 2 tablets by mouth 2 (two) times daily. OTC eye vitamin without zinc   Yes Historical Provider, MD  OXYGEN Inhale 1 L into the lungs See admin instructions. Use at bedtime and as needed during activity   Yes Historical Provider, MD  predniSONE (DELTASONE) 5 MG tablet Take 7.5 mg by mouth daily with breakfast.  05/04/15  Yes Historical Provider, MD  Probiotic CAPS Take 1 capsule by mouth daily.   Yes Historical Provider, MD  QUEtiapine (SEROQUEL) 200 MG tablet Take 200 mg by mouth at bedtime.    Yes Historical Provider, MD  sulfamethoxazole-trimethoprim (BACTRIM DS,SEPTRA DS) 800-160 MG tablet Take 2 tablets by mouth every 12 (twelve) hours. Hold trimpex while on this antibiotic.  Restart trimpex once this antibiotic is complete. 12/27/15  Yes Stark Klein, MD  tizanidine (ZANAFLEX) 2 MG capsule Take 2 mg by mouth at bedtime as needed for muscle spasms.   Yes Historical Provider, MD  warfarin (COUMADIN) 2.5 MG tablet Take 1 tablet (2.5 mg total) by mouth daily. Take as directed.  Start one per day. 12/27/15  Yes Stark Klein, MD  aspirin 81 MG chewable tablet Chew 81 mg by mouth daily.    Historical Provider, MD  atorvastatin (LIPITOR) 20 MG tablet Take 20 mg by mouth daily.     Historical Provider, MD  furosemide (LASIX) 20 MG tablet TAKE ONE OR TWO TABLETS DAILY Patient not taking: Reported on 01/06/2016 02/09/15   Jettie Booze, MD  LORazepam (ATIVAN) 1 MG tablet Take 1 tablet (1 mg total) by mouth every 8 (eight) hours. Patient not taking: Reported on 01/06/2016 08/09/15   Mauri Pole, MD  metoprolol (LOPRESSOR) 50 MG tablet Take 1 tablet (50 mg total) by mouth 2 (two) times daily. Please call and schedule a six month follow up appointment with Dr  Irish Lack per last office visit Patient not taking: Reported on 01/06/2016 06/16/15   Jettie Booze, MD  Tiotropium Bromide Monohydrate (SPIRIVA RESPIMAT) 2.5 MCG/ACT AERS Inhale 2 puffs into the lungs daily. Reported on 08/11/2015 Patient not taking: Reported on 01/06/2016 08/12/15   [name], MD  trimethoprim (TRIMPEX) 100 MG tablet Take 1 tablet (100 mg total) by mouth at bedtime. continuous Patient not taking: Reported on 01/06/2016 01/06/16   Stark Klein, MD     Family History  Problem Relation Age of Onset  . Lung cancer Sister   . Heart disease Mother   .  Stroke Father   . Aneurysm Brother   . Colon cancer Maternal Aunt 35  . Cancer    . Esophageal cancer Neg Hx   . Rectal cancer Neg Hx   . Stomach cancer Neg Hx     Social History   Social History  . Marital status: Widowed    Spouse name: N/A  . Number of children: 2  . Years of education: 12   Occupational History  . Retired    Social History Main Topics  . Smoking status: Former Smoker    Packs/day: 1.00    Years: 5.00    Types: Cigarettes    Quit date: 05/30/1989  . Smokeless tobacco: Never Used  . Alcohol use No  . Drug use: No  . Sexual activity: Not on file   Other Topics Concern  . Not on file   Social History Narrative   Patient is widowed with 2 children   Patient is right handed   Patient has a high school education   Patient drinks 3 cups daily           Vital Signs: BP 127/71 (BP Location: Left Arm, Patient Position: Sitting, Cuff Size: Normal)   Pulse (!) 104   Temp 98.1 F (36.7 C) (Oral)   Resp 15   Ht 5\' 5"  (1.651 m)   Wt 110 lb (49.9 kg)   SpO2 93%   BMI 18.30 kg/m   Physical Exam patient awake, alert. Chest with clear breath sounds bilaterally. Heart with regular rate and rhythm. Abdomen soft, positive bowel sounds, currently nontender. Left upper quadrant drain intact, insertion site okay, mild tenderness. Small amount of turbid beige fluid in drain bag; lower  extremities with no edema.  Imaging: No results found.  Labs:  CBC:  Recent Labs  12/24/15 0328 12/25/15 0028 12/26/15 0527 12/27/15 0152  WBC 13.9* 11.9* 9.7 7.6  HGB 9.4* 9.6* 9.6* 9.2*  HCT 28.7* 29.7* 30.0* 28.6*  PLT 739* 721* 649* 596*    COAGS:  Recent Labs  12/23/15 1813 12/26/15 0527 12/27/15 0152  INR 1.21 1.17 1.51    BMP:  Recent Labs  12/11/15 0354 12/13/15 0832 12/23/15 1813 12/24/15 0328  NA 136 136 135 136  K 3.4* 3.8 4.0 4.0  CL 105 102 103 107  CO2 25 27 22  21*  GLUCOSE 122* 113* 151* 158*  BUN 8 8 5* <5*  CALCIUM 8.5* 8.7* 9.4 8.8*  CREATININE 0.65 0.69 0.82 0.82  GFRNONAA >60 >60 >60 >60  GFRAA >60 >60 >60 >60    LIVER FUNCTION TESTS:  Recent Labs  01/19/15 1154 11/30/15 1558 12/23/15 1813  BILITOT 0.4 0.7 0.3  AST 14 18 25   ALT 9 11* 23  ALKPHOS 54 54 92  PROT 7.0 7.8 6.7  ALBUMIN 4.0 4.1 2.9*    Assessment: Ms. Frangella is a 70 year old white female who is status post distal pancreatectomy on 12/07/15 with pathology revealing microcystic serous cystadenoma. On 12/24/15 she underwent drainage of a postop left upper abdominal fluid collection. Also present on the postoperative CT scan was acute SMV thrombosis, and she is currently on Coumadin therapy. Fluid cultures were negative. She was discharged from  [facility] on 12/27/15. She presented today for follow-up CT scan and assessment of her left upper quadrant drain. Preliminary findings from today's CT revealed significant reduction in size of left upper quadrant fluid collection. At this time recommend continued drainage until output is minimal. She will be scheduled for follow-up visit in IR  clinic in 2 weeks without imaging. She was given prefilled normal saline syringes for drain irrigation if needed and told to contact our service with any additional questions or concerns.      Signed: D. Rowe Robert 01/06/2016, 3:24 PM   Please refer to Dr. Fritz Pickerel  attestation of this  note for management and plan.      Patient ID: LATORY DELOERA, female   DOB: 1945/12/13, 70 y.o.   MRN: [id number]

## 2016-01-10 DIAGNOSIS — Z90411 Acquired partial absence of pancreas: Secondary | ICD-10-CM | POA: Diagnosis not present

## 2016-01-10 DIAGNOSIS — I2729 Other secondary pulmonary hypertension: Secondary | ICD-10-CM | POA: Diagnosis not present

## 2016-01-10 DIAGNOSIS — I5032 Chronic diastolic (congestive) heart failure: Secondary | ICD-10-CM | POA: Diagnosis not present

## 2016-01-10 DIAGNOSIS — J8410 Pulmonary fibrosis, unspecified: Secondary | ICD-10-CM | POA: Diagnosis not present

## 2016-01-10 DIAGNOSIS — Z483 Aftercare following surgery for neoplasm: Secondary | ICD-10-CM | POA: Diagnosis not present

## 2016-01-10 DIAGNOSIS — I110 Hypertensive heart disease with heart failure: Secondary | ICD-10-CM | POA: Diagnosis not present

## 2016-01-11 ENCOUNTER — Encounter: Payer: Self-pay | Admitting: Interventional Radiology

## 2016-01-11 DIAGNOSIS — I10 Essential (primary) hypertension: Secondary | ICD-10-CM | POA: Diagnosis not present

## 2016-01-11 DIAGNOSIS — D638 Anemia in other chronic diseases classified elsewhere: Secondary | ICD-10-CM | POA: Diagnosis not present

## 2016-01-11 DIAGNOSIS — N185 Chronic kidney disease, stage 5: Secondary | ICD-10-CM | POA: Diagnosis not present

## 2016-01-11 DIAGNOSIS — Z7901 Long term (current) use of anticoagulants: Secondary | ICD-10-CM | POA: Diagnosis not present

## 2016-01-14 IMAGING — CT CT CHEST HIGH RESOLUTION W/O CM
2 of 6 series · 14 of 36 positions shown, 17 images · non-contrast
Comparison: No prior chest CT.  03/09/2014 chest radiograph.

CLINICAL DATA: Provided history of dyspnea an interstitial lung
disease. Remote history of renal transplant. History of CHF per
electronic medical records. Former smoker, quit in 0220.

EXAM:
CT CHEST WITHOUT CONTRAST
TECHNIQUE: Multidetector CT imaging of the chest was performed following the
standard protocol without intravenous contrast. High resolution
imaging of the lungs, as well as inspiratory and expiratory imaging,
was performed.

[Series 4: high resolution · axial · 0.56mm/px · z∈[+946,+1156]mm · 11 of 50 slices shown, 14 images]
[im 4/50  mediastinal]
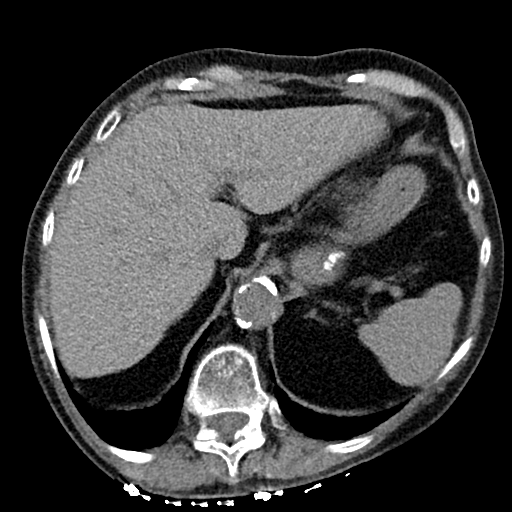
[im 4/50  lung]
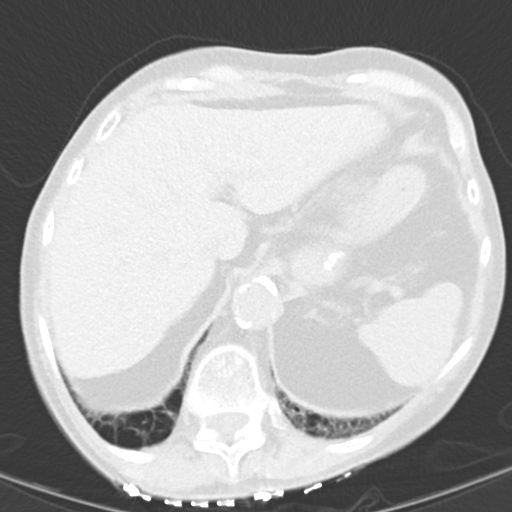
[im 7/50  lung]
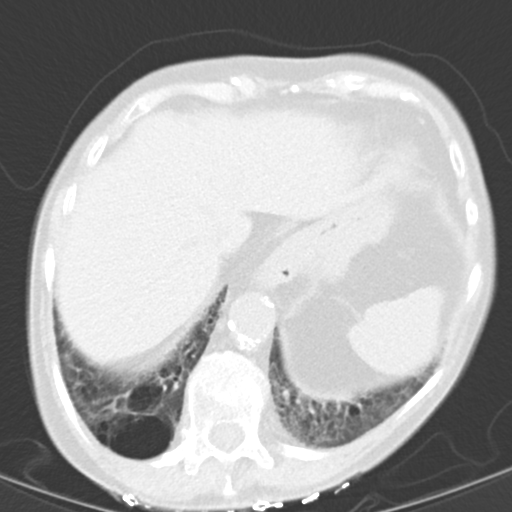
[im 14/50  lung]
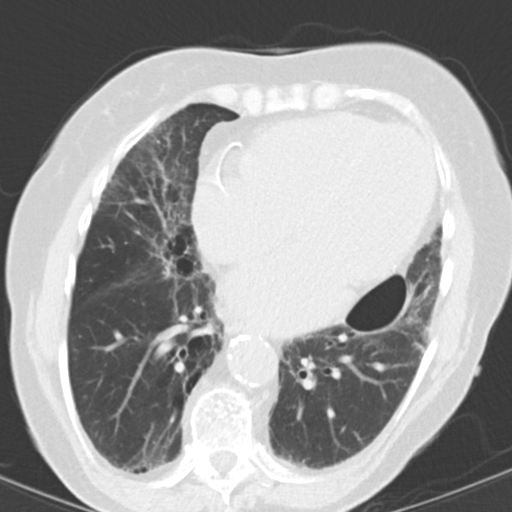
[im 17/50  lung]
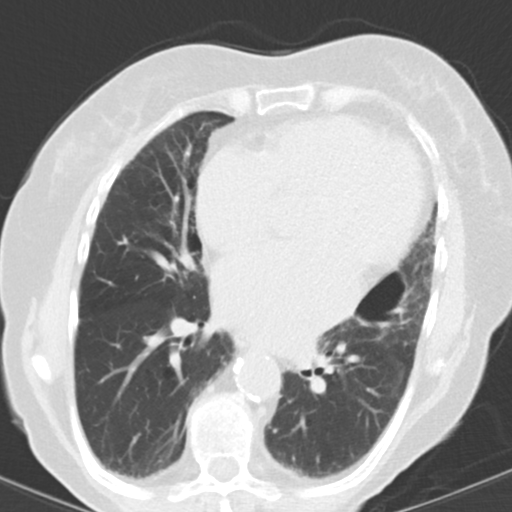
[im 20/50  mediastinal]
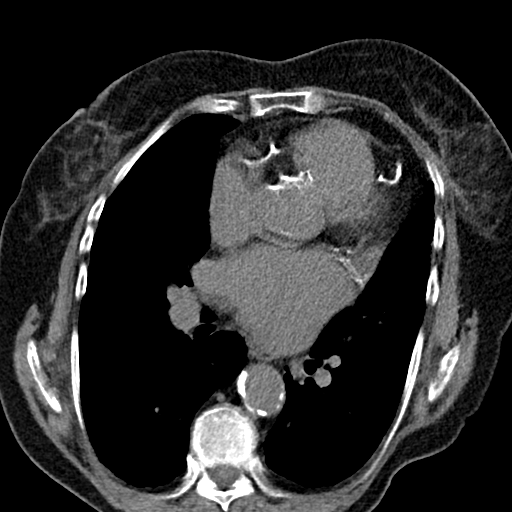
[im 20/50  lung]
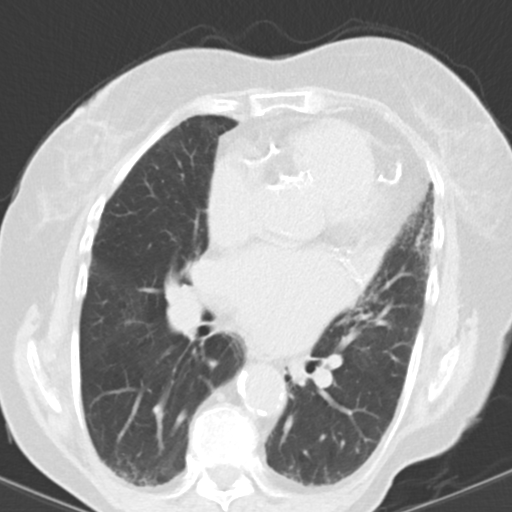
[im 27/50  lung]
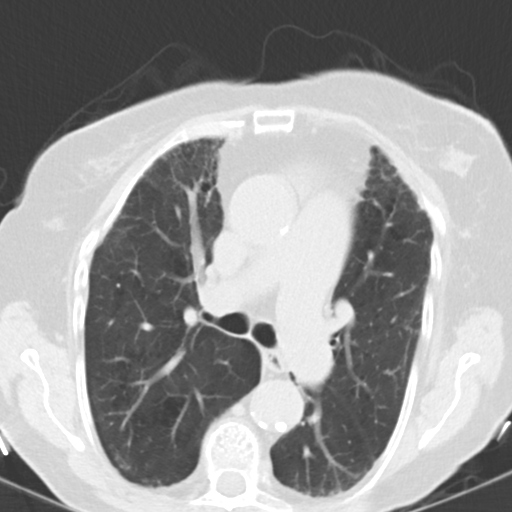
[im 30/50  lung]
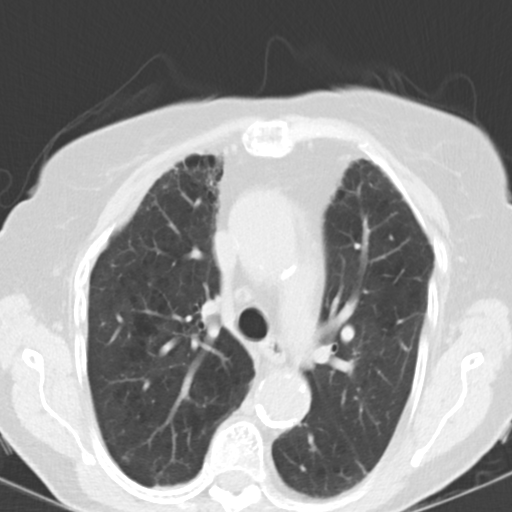
[im 33/50  lung]
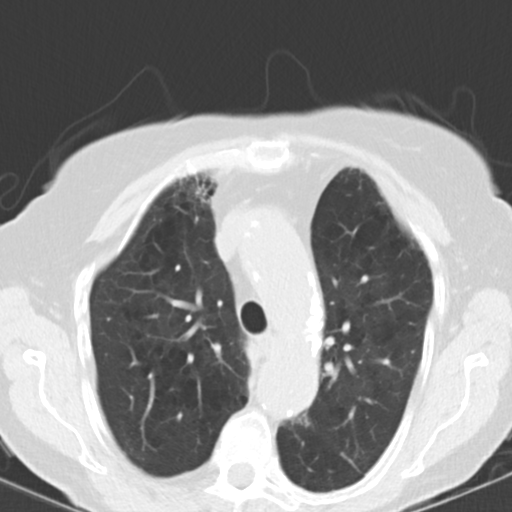
[im 36/50  mediastinal]
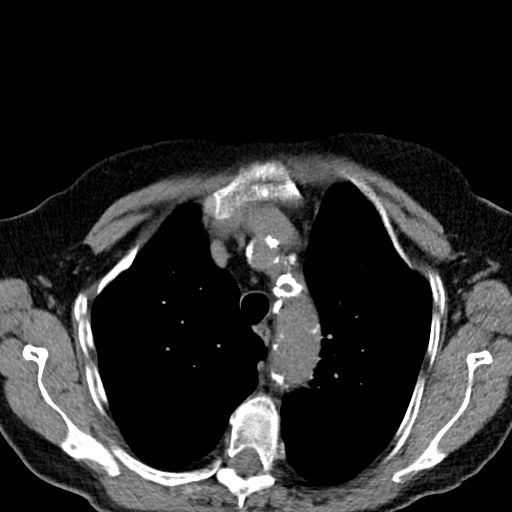
[im 36/50  lung]
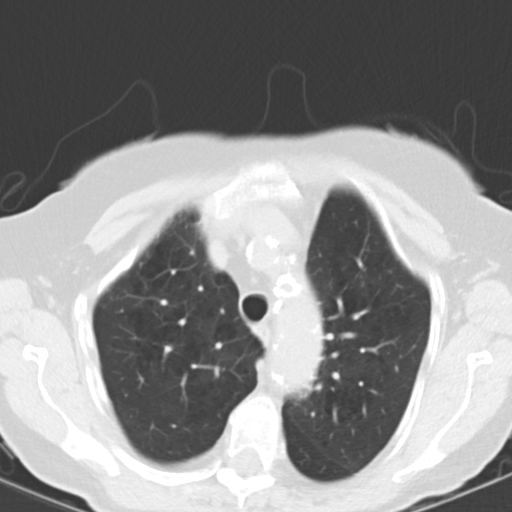
[im 43/50  lung]
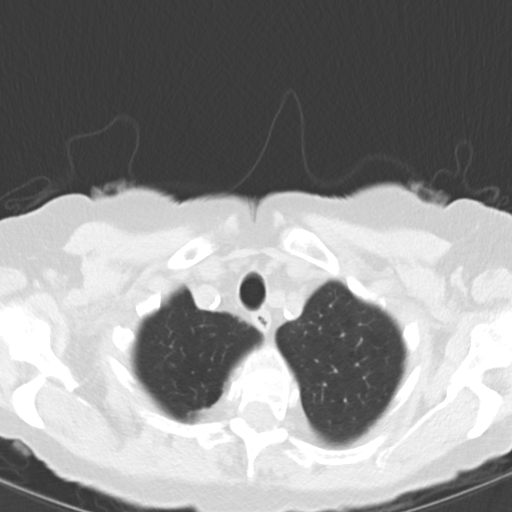
[im 46/50  lung]
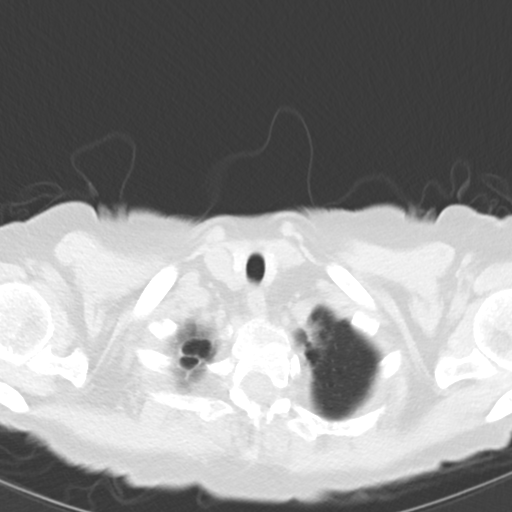

[Series 8: coronal · coronal · 0.56mm/px · 3 of 127 slices shown]
[im 26/127  lung]
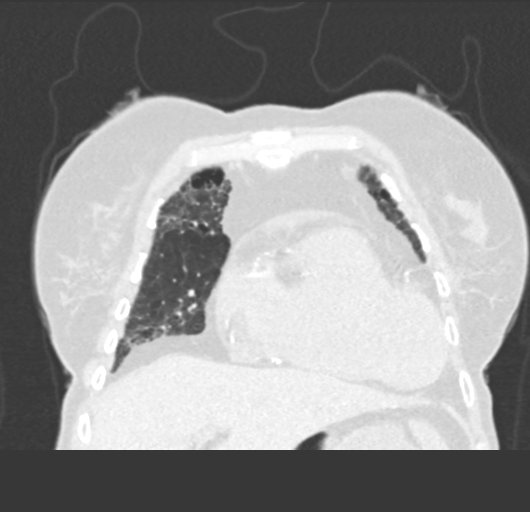
[im 51/127  lung]
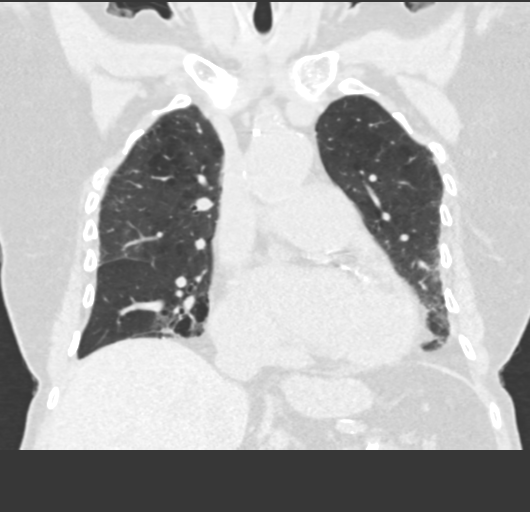
[im 76/127  lung]
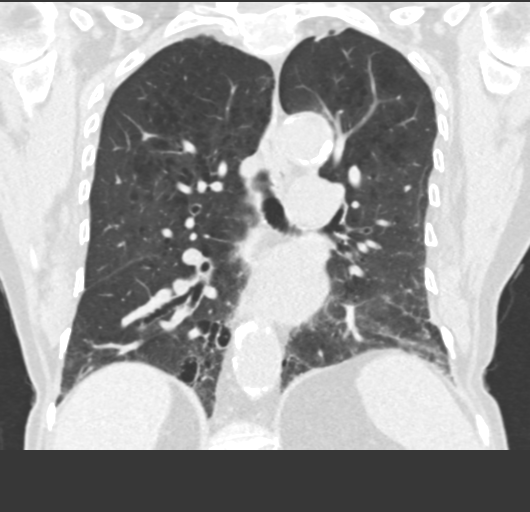

[14 of 36 positions shown; findings below may reference images not displayed]

FINDINGS: Mediastinum/Nodes: Mild cardiomegaly, slightly increased since
07/16/2014 CT abdomen study. No pericardial fluid/thickening. Left
main, left anterior descending, left circumflex and right coronary
atherosclerosis. Atherosclerotic nonaneurysmal thoracic aorta.
Dilated main pulmonary artery (3.4 cm diameter). Normal visualized
thyroid. Normal esophagus. No pathologically enlarged axillary,
mediastinal or gross hilar lymph nodes, noting limited sensitivity
for the detection of hilar adenopathy on this noncontrast study.

Lungs/Pleura: No pneumothorax. No pleural effusion. Mild
centrilobular emphysema. Mild bullous and paraseptal emphysema in
the mid to lower lungs. There is a 3 mm solid peripheral left upper
lobe pulmonary nodule (series 5/image 9). No acute consolidative
airspace disease, additional significant pulmonary nodules or lung
masses. No significant air trapping on the expiration sequence.
There are patchy regions of subpleural reticulation, mild
ground-glass attenuation and traction bronchiectasis throughout both
lungs, most prominent in the anterior basilar upper lobes, right
middle lobe and posterior/basilar lower lobes. There is a small
focus of honeycombing in the anterior right upper lobe (series
2/image 5). There are no additional convincing regions of frank
honeycombing (for example subjacent subpleural cysts in the medial
right lower lobe on series 5/image 39 have been present back to the
02/06/2007 CT abdomen study where the additional findings of
fibrosis were absent, most in keeping with paraseptal emphysema).
These findings were largely absent at the lung bases on the
02/06/2007 CT abdomen study, and have demonstrated slow progression
at the lung bases on multiple subsequent CT abdomen studies back to
12/02/2010.

Upper abdomen: Partially visualized is a hypodense 0.9 cm exophytic
renal cortical lesion with coarse mural calcification in the upper
left kidney, stable since 02/06/2007 and benign. Partially
visualized is a lobulated cystic mass in the left upper quadrant of
the abdomen, which was seen to arise from the distal pancreatic body
on on the 07/16/2014 CT abdomen study, and was characterized as a
probably benign microcystic serous cystadenoma on the 05/05/2013
enhanced CT study.

Musculoskeletal: No aggressive appearing focal osseous lesions.
Healed deformity in the upper sternum. Mild degenerative changes in
the thoracic spine.
IMPRESSION: 1. Interstitial lung disease characterized by patchy regions of
subpleural reticulation, mild ground-glass attenuation and traction
bronchiectasis in the mid to lower lungs. Small region of
honeycombing in the right upper lobe. Findings have slowly
progressed at the lung bases on multiple CT abdomen studies back to
12/02/2010 and were absent on the 02/06/2007 CT abdomen study.
Findings are most suggestive of slowly progressive usual
interstitial pneumonia (UIP). Follow-up high-resolution chest CT in
6-12 months would be useful to assess for temporal change.
2. Mild centrilobular, paraseptal and bullous emphysema.
3. Solitary 3 mm left upper lobe pulmonary nodule. Given risk
factors for bronchogenic carcinoma, follow-up chest CT at 1 year is
recommended. This recommendation follows the consensus statement:
Guidelines for Management of Small Pulmonary Nodules Detected on CT
Scans: A Statement from the [HOSPITAL] as published in
4. Mild cardiomegaly, increased. Left main and 3 vessel coronary
atherosclerosis.
5. Dilated main pulmonary artery, suggesting pulmonary arterial
hypertension.
6. Partially visualized large lobulated cystic mass in the left
upper quadrant of the abdomen, previously characterized as a
probably benign microcystic serous cystadenoma of the pancreatic
body on the 05/05/2013 enhanced CT abdomen study.

## 2016-01-18 ENCOUNTER — Telehealth: Payer: Self-pay | Admitting: Hematology and Oncology

## 2016-01-18 DIAGNOSIS — Z483 Aftercare following surgery for neoplasm: Secondary | ICD-10-CM | POA: Diagnosis not present

## 2016-01-18 DIAGNOSIS — I2729 Other secondary pulmonary hypertension: Secondary | ICD-10-CM | POA: Diagnosis not present

## 2016-01-18 DIAGNOSIS — J8410 Pulmonary fibrosis, unspecified: Secondary | ICD-10-CM | POA: Diagnosis not present

## 2016-01-18 DIAGNOSIS — I110 Hypertensive heart disease with heart failure: Secondary | ICD-10-CM | POA: Diagnosis not present

## 2016-01-18 DIAGNOSIS — E43 Unspecified severe protein-calorie malnutrition: Secondary | ICD-10-CM | POA: Diagnosis not present

## 2016-01-18 DIAGNOSIS — K558 Other vascular disorders of intestine: Secondary | ICD-10-CM | POA: Diagnosis not present

## 2016-01-18 DIAGNOSIS — I5032 Chronic diastolic (congestive) heart failure: Secondary | ICD-10-CM | POA: Diagnosis not present

## 2016-01-18 DIAGNOSIS — Z90411 Acquired partial absence of pancreas: Secondary | ICD-10-CM | POA: Diagnosis not present

## 2016-01-18 NOTE — Telephone Encounter (Signed)
Tammy from [facility] wanting to know why the referral was cancelled and that Dr. Fara Olden, RUPASHREE needed the pt to see a hematologist due to fhx of dvt. Made appt on 12/4 w/Gudena at 345pm. She will contact the pt with the appt.

## 2016-01-19 ENCOUNTER — Encounter (INDEPENDENT_AMBULATORY_CARE_PROVIDER_SITE_OTHER): Payer: Medicare Other | Admitting: Ophthalmology

## 2016-01-20 ENCOUNTER — Ambulatory Visit
Admission: RE | Admit: 2016-01-20 | Discharge: 2016-01-20 | Disposition: A | Discharge status: Home / Self Care | Payer: Medicare Other | Source: Ambulatory Visit | Attending: Interventional Radiology | Admitting: Interventional Radiology

## 2016-01-20 ENCOUNTER — Other Ambulatory Visit (HOSPITAL_COMMUNITY): Payer: Self-pay | Admitting: Interventional Radiology

## 2016-01-20 DIAGNOSIS — Z4682 Encounter for fitting and adjustment of non-vascular catheter: Secondary | ICD-10-CM | POA: Diagnosis not present

## 2016-01-20 DIAGNOSIS — K8689 Other specified diseases of pancreas: Secondary | ICD-10-CM

## 2016-01-20 HISTORY — PX: IR GENERIC HISTORICAL: IMG1180011

## 2016-01-20 NOTE — Progress Notes (Signed)
Patient ID: Morgan Cooper, female   DOB: 03/01/1945, 70 y.o.   MRN: [id number]         Chief Complaint: Pancreatic drainage catheter  Referring Physician(s): Byerly  History of Present Illness: Morgan Cooper is a 70 y.o. female with past medical history significant for CHF, anxiety, arthritis and CVA who underwent a distal pancreatectomy on 12/07/2015 with pathology revealing a microcystic serous adenoma. Patient underwent CT-guided percutaneous drainage catheter placement into a pancreatic bed fluid collection on 12/24/2015. Subsequent contrast enhanced CT scan of the abdomen and pelvis performed 01/06/2016 demonstrated near complete resolution of the peripancreatic fluid collection.  The patient was seen in consultation at the interventional radiology clinic on 01/06/2016 however reported persistent high-volume output (approximately 50-60) cc of fluid per day. As such, the decision was made to continue with the pertinent drainage catheter. She returns today for transcatheter evaluation and management. She is accompanied by her sister though serves as her own historian.  The patient is no longer flushing the percutaneous drainage catheter. She is not currently on antibiotics.  Patient states she has a minimal amount of discomfort regarding the entrance site of the percutaneous drainage catheter. She also reports fatigue and weakness. She denies fever or chills.  Past Medical History:  Diagnosis Date  . Anxiety   . Arthritis    hips, legs, hand   . Barrett's esophagus   . Bone pain   . Bruises easily   . Cancer ([place])    skin cancer that has been removed- L hand  . CHF (congestive heart failure) ([place])   . Chills   . CVA (cerebral infarction)    Mini Strokes   . Depression   . Disturbed concentration   . Diverticulitis of large intestine with perforation Hx colectomy  . Esophageal stricture   . Fatigue   . Forgetfulness   . Gastroparesis   . Generalized headaches   . GERD  (gastroesophageal reflux disease)   . Heart failure, diastolic, chronic ([place])   . History of hiatal hernia   . Hx of cardiovascular stress test    Lexiscan Myoview (06/2013):  No ischemia, EF 72%; Low Risk  . Hyperlipemia   . Hypertension   . Irritable   . Itch    skin  . Keratosis   . Macular degeneration    left eye  . Nausea    little vomitting  . Osteoporosis   . Pancreatic cyst   . Pancreatic mass   . Panic attacks   . Persistent headaches   . Pulmonary hypertension   . Rapid heart rate    some times  . Renal failure    transplant - 1983,  followed by Dr. Justin Mend   . Sleep apnea    uses at bedtime- 2liters/min , negative- apnea., last study- 2/017  . Sleep difficulties   . Stroke ([place])   . TIA (transient ischemic attack)     Past Surgical History:  Procedure Laterality Date  . APPENDECTOMY    . cataract surgery bilateral    . COLON RESECTION  2010   Colostomy  . COLOSTOMY  2011   Reversal   . colostomy reversal    . ESOPHAGEAL DILATION    . IR GENERIC HISTORICAL  01/06/2016   IR RADIOLOGIST EVAL & MGMT 01/06/2016 Greggory Keen, MD [facility] INTERV RAD  . KIDNEY TRANSPLANT  1984  . left arm surgery     due to injury, two plates in place   . PANCREATECTOMY N/A  12/07/2015   Procedure: HAND ASSISTED LAPAROSCOPIC DISTAL PANCREATECTOMY;  Surgeon: Stark Klein, MD;  Location: [facility];  Service: General;  Laterality: N/A;  . PARATHYROIDECTOMY  11/04/2010   left inferior parathyroid    Allergies: Amoxicillin and Tape  Medications: Prior to Admission medications   Medication Sig Start Date End Date Taking? Authorizing Provider  aspirin 81 MG chewable tablet Chew 81 mg by mouth daily.    Historical Provider, MD  atorvastatin (LIPITOR) 20 MG tablet Take 20 mg by mouth daily.     Historical Provider, MD  azaTHIOprine (IMURAN) 50 MG tablet Take 50 mg by mouth daily.     Historical Provider, MD  cycloSPORINE (RESTASIS) 0.05 % ophthalmic emulsion Place 1 drop into both eyes 2  (two) times daily.      Historical Provider, MD  diazepam (VALIUM) 2 MG tablet Take 2 mg by mouth daily as needed for anxiety.    Historical Provider, MD  FLUoxetine (PROZAC) 20 MG capsule Take 60 mg by mouth daily.     Historical Provider, MD  furosemide (LASIX) 20 MG tablet TAKE ONE OR TWO TABLETS DAILY Patient not taking: Reported on 01/06/2016 02/09/15   Morgan Booze, MD  HYDROcodone-acetaminophen [facility]) 10-325 MG per tablet Take 1 tablet by mouth every 6 (six) hours as needed (pain).     Historical Provider, MD  LORazepam (ATIVAN) 1 MG tablet Take 1 tablet (1 mg total) by mouth every 8 (eight) hours. Patient not taking: Reported on 01/06/2016 08/09/15   Mauri Pole, MD  metoprolol (LOPRESSOR) 50 MG tablet Take 1 tablet (50 mg total) by mouth 2 (two) times daily. Please call and schedule a six month follow up appointment with Dr Irish Lack per last office visit Patient not taking: Reported on 01/06/2016 06/16/15   Morgan Booze, MD  omeprazole (PRILOSEC) 40 MG capsule Take 40 mg by mouth daily.    Historical Provider, MD  ondansetron (ZOFRAN) 4 MG tablet Take 1 tablet (4 mg total) by mouth every 8 (eight) hours as needed for nausea or vomiting. 06/02/15   Mauri Pole, MD  OVER THE COUNTER MEDICATION Take 2 tablets by mouth 2 (two) times daily. OTC eye vitamin without zinc    Historical Provider, MD  OXYGEN Inhale 1 L into the lungs See admin instructions. Use at bedtime and as needed during activity    Historical Provider, MD  predniSONE (DELTASONE) 5 MG tablet Take 7.5 mg by mouth daily with breakfast.  05/04/15   Historical Provider, MD  Probiotic CAPS Take 1 capsule by mouth daily.    Historical Provider, MD  QUEtiapine (SEROQUEL) 200 MG tablet Take 200 mg by mouth at bedtime.     Historical Provider, MD  sulfamethoxazole-trimethoprim (BACTRIM DS,SEPTRA DS) 800-160 MG tablet Take 2 tablets by mouth every 12 (twelve) hours. Hold trimpex while on this antibiotic.  Restart  trimpex once this antibiotic is complete. 12/27/15   Stark Klein, MD  Tiotropium Bromide Monohydrate (SPIRIVA RESPIMAT) 2.5 MCG/ACT AERS Inhale 2 puffs into the lungs daily. Reported on 08/11/2015 Patient not taking: Reported on 01/06/2016 08/12/15   [name], MD  tizanidine (ZANAFLEX) 2 MG capsule Take 2 mg by mouth at bedtime as needed for muscle spasms.    Historical Provider, MD  trimethoprim (TRIMPEX) 100 MG tablet Take 1 tablet (100 mg total) by mouth at bedtime. continuous Patient not taking: Reported on 01/06/2016 01/06/16   Stark Klein, MD  warfarin (COUMADIN) 2.5 MG tablet Take 1 tablet (2.5 mg  total) by mouth daily. Take as directed.  Start one per day. 12/27/15   Stark Klein, MD     Family History  Problem Relation Age of Onset  . Lung cancer Sister   . Heart disease Mother   . Stroke Father   . Aneurysm Brother   . Colon cancer Maternal Aunt 44  . Cancer    . Esophageal cancer Neg Hx   . Rectal cancer Neg Hx   . Stomach cancer Neg Hx     Social History   Social History  . Marital status: Widowed    Spouse name: N/A  . Number of children: 2  . Years of education: 12   Occupational History  . Retired    Social History Main Topics  . Smoking status: Former Smoker    Packs/day: 1.00    Years: 5.00    Types: Cigarettes    Quit date: 05/30/1989  . Smokeless tobacco: Never Used  . Alcohol use No  . Drug use: No  . Sexual activity: Not on file   Other Topics Concern  . Not on file   Social History Narrative   Patient is widowed with 2 children   Patient is right handed   Patient has a high school education   Patient drinks 3 cups daily          ECOG Status: 2 - Symptomatic, <50% confined to bed  Review of Systems: A 12 point ROS discussed and pertinent positives are indicated in the HPI above.  All other systems are negative.  Review of Systems  Constitutional: Positive for activity change and fatigue. Negative for chills and fever.    Gastrointestinal: Positive for abdominal pain.       Patient reports a minimal amount of pain at the drainage catheter insertion site.    Vital Signs: BP (!) 154/74 (BP Location: Left Arm, Patient Position: Sitting, Cuff Size: Normal)   Pulse (!) 56   Temp 98 F (36.7 C) (Oral)   Resp 15   Ht 5\' 5"  (1.651 m)   Wt 110 lb (49.9 kg)   SpO2 98%   BMI 18.30 kg/m   Physical Exam  Constitutional: She appears well-developed and well-nourished.  Abdominal:    Location of the percutaneous drainage catheter.     Imaging:  Selected images from CT scan of the abdomen and pelvis performed 01/06/2016, 01/06/2016 and CT-guided percutaneous drainage catheter placement performed 12/24/2015 were reviewed in detail.  Ct Abdomen Pelvis W Contrast  Result Date: 01/06/2016 CLINICAL DATA:  Status post distal pancreatectomy for a microcystic serous cystadenoma complicated by pancreatic leak and large fluid collection. Status post percutaneous drainage 12/24/2015. Outpatient follow-up. EXAM: CT ABDOMEN AND PELVIS WITH CONTRAST TECHNIQUE: Multidetector CT imaging of the abdomen and pelvis was performed using the standard protocol following bolus administration of intravenous contrast. CONTRAST:  150mL ISOVUE-300 IOPAMIDOL (ISOVUE-300) INJECTION 61% COMPARISON:  12/24/2015 FINDINGS: Lower chest: Bibasilar fibrosis and bullous emphysema as before. No lower lobe pneumonia, collapse or consolidation. Heart is enlarged. Coronary atherosclerosis noted. No pericardial or pleural effusion. Thoracic aortic atherosclerosis evident. Hepatobiliary: No focal liver abnormality is seen. No gallstones, gallbladder wall thickening, or biliary dilatation. Pancreas: Again, patient is status post distal pancreatectomy. Uncinate process and pancreatic head remain with normal enhancement. Resolution of the left upper quadrant pancreatic surgical site fluid collection status post percutaneous drain. Stable drain catheter position.  Minimal ill-defined and strandy edema/ inflammation in the left upper quadrant surgical site. No new abdominal fluid collections.  Spleen: Similar heterogeneous enhancement, suspect degree of evolving splenic infarct in the upper aspect of the spleen. Adrenals/Urinary Tract: Normal adrenal glands for age. Severe atrophy and cystic change of the native kidneys. Left pelvic renal transplant noted. Urinary bladder unremarkable. No renal obstruction or hydronephrosis. Stomach/Bowel: Negative for bowel obstruction, significant dilatation, ileus, or free air. Moderate formed stool throughout the colon. Vascular/Lymphatic: Extensive abdominal atherosclerosis and small infrarenal aortic aneurysm measuring 3 cm as before. Iliac atherosclerosis also noted. No occlusive process. No retroperitoneal hemorrhage. Reproductive: Uterus and bilateral adnexa are unremarkable. Other: No abdominal wall hernia or abnormality. No abdominopelvic ascites. Musculoskeletal: Bones are osteopenic. Degenerative changes of the spine with an associated scoliosis. Grade 1 anterolisthesis of L5 upon S1 without spondylolysis. Advanced degenerative arthropathy of the left hip and acetabulum with cystic formation. IMPRESSION: Resolved left upper quadrant pancreatic surgical bed large fluid collection. Stable drain catheter position. Residual strandy edema/inflammation in in the surgical site. No new abdominal or pelvic fluid collections. Electronically Signed   By: Jerilynn Mages.  Shick M.D.   On: 01/06/2016 16:45   Ct Abdomen Pelvis W Contrast  Addendum Date: 12/23/2015   ADDENDUM REPORT: 12/23/2015 15:55 ADDENDUM: Acute findings discussed with and reconfirmed by Dr.FAERA BYERLY on 12/23/2015 at 3:50 pm. Electronically Signed   By: Elon Alas M.D.   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 Now with severe LEFT upper quadrant pain, assess for abscess. History of colectomy for  diverticulitis, appendectomy, kidney transplant. EXAM: CT ABDOMEN AND PELVIS WITH CONTRAST TECHNIQUE: Multidetector CT imaging of the abdomen and pelvis was performed using the standard protocol following bolus administration of intravenous contrast. CONTRAST:  124mL ISOVUE-300 IOPAMIDOL (ISOVUE-300) INJECTION 61% COMPARISON:  CT abdomen Jul 16, 2014 FINDINGS: LOWER CHEST: Bibasilar bullous changes, chronic interstitial changes. Heart size is mildly enlarged. Severe coronary artery calcifications. No pericardial effusions. HEPATOBILIARY: Liver and gallbladder are normal. PANCREAS: Interval partial pancreatectomy. 5.2 x 5.6 cm focal fluid collection with rim enhancement within the pancreatic body, at the level the surgical bed. Collection is contiguous with ill-defined expansile fluid collection LEFT abdomen with small amount of subcutaneous gas. Residual pancreatic head and proximal body are normal. SPLEEN: Heterogeneous geographic enhancement of the spleen on early phase, though this may be secondary to bolus timing and is incompletely imaged on the delayed phase. Lack of normal contrast opacification of the splenic hilar vessels. ADRENALS/URINARY TRACT: Atrophic native kidneys. Stable 12 mm LEFT upper pole cyst. Stable 6 mm dense lesion. LEFT pelvic renal transplant with normal enhancement, similar dilated LEFT interpolar calyx without hydronephrosis, nephrolithiasis or renal masses. Normal contrast excretion on delayed phase. Urinary bladder is well distended and unremarkable. Normal adrenal glands. STOMACH/BOWEL: Mass effect on the stomach due to LEFT abdomen fluid collection. The stomach, small and large bowel are normal in course and caliber without inflammatory changes. Normal appendix. VASCULAR/LYMPHATIC: Central thrombosis superior mesenteric vein. Severe calcific atherosclerosis, 3 cm infrarenal aortic aneurysm. Chronic infrarenal aortic dissection and aneurysm, with increasing RIGHT intramural hematoma,  aneurysm is now 4.2 cm in transaxial dimension, previously 3.5 cm in 2015 and 3.8 cm in 2016. REPRODUCTIVE: Normal. OTHER: None. MUSCULOSKELETAL: Nonacute. Anterior abdominal wall scarring at site of recent surgery without focal fluid collection. LEFT anterior abdominal wall scarring. Severe degenerative change of LEFT hip. Osteopenia. Grade 2 L5-S1 anterolisthesis without spondylolysis. IMPRESSION: 5.2 x 5.6 cm seroma versus abscess within pancreatic body surgical bed. Free fluid about the pancreatic tail surgical bed, given local mass-effect  this is concerning for phlegmon. Acute thrombosis superior mesenteric vein. Heterogeneous enhancement of the spleen with lack of normal splenic hilar vessels concerning for splenic vascular compromise and infarcts. Alternatively this could for represent early splenic infection. Enlarging 4.2 cm infrarenal aortic aneurysm. Recommend followup by ultrasound in 1 year. This recommendation follows ACR consensus guidelines: White Paper of the ACR Incidental Findings Committee II on Vascular Findings. J Am Coll Radiol 2013; 10:789-794. Electronically Signed: By: Elon Alas M.D. On: 12/23/2015 15:41   Ct Image Guided Fluid Drain By Catheter  Result Date: 12/24/2015 INDICATION: Large postop abdominal fluid collection with fever, status post distal pancreatectomy EXAM: CT IMAGE GUIDED FLUID DRAIN BY CATHETER MEDICATIONS: 1% lidocaine locally ANESTHESIA/SEDATION: Fentanyl 50 mcg IV; Versed 2.0 mg IV Moderate Sedation Time:  16 minutes The patient was continuously monitored during the procedure by the interventional radiology nurse under my direct supervision. COMPLICATIONS: None immediate. PROCEDURE: Informed written consent was obtained from the patient after a thorough discussion of the procedural risks, benefits and alternatives. All questions were addressed. Maximal Sterile Barrier Technique was utilized including caps, mask, sterile gowns, sterile gloves, sterile drape,  hand hygiene and skin antiseptic. A timeout was performed prior to the initiation of the procedure. Previous imaging reviewed. Patient positioned slightly left anterior oblique. Noncontrast localization CT performed. The large complex left upper quadrant abdominal fluid collection was localized. Under sterile conditions and local anesthesia, an 18 gauge 15 cm access needle was advanced percutaneously into the central aspect of the fluid collection close to the pancreatic surgical site. Needle position confirmed with CT. Syringe aspiration yielded milky fluid. Guidewire inserted followed by tract dilatation to advance a 10 [place] drain. Drain catheter position confirmed with CT. 220 cc milky fluid aspirated. Sample sent for Gram stain and culture. Catheter connected to external gravity drainage. Sterile dressing applied. No immediate complication. Patient tolerated the procedure well. IMPRESSION: Successful CT-guided left upper quadrant abdominal fluid collection drain insertion (10 [place]) Electronically Signed   By: Jerilynn Mages.  Shick M.D.   On: 12/24/2015 15:49   Ir Radiologist Eval & Mgmt  Result Date: 01/11/2016 Please refer to notes tab for details about interventional procedure. (Op Note)   Labs:  CBC:  Recent Labs  12/24/15 0328 12/25/15 0028 12/26/15 0527 12/27/15 0152  WBC 13.9* 11.9* 9.7 7.6  HGB 9.4* 9.6* 9.6* 9.2*  HCT 28.7* 29.7* 30.0* 28.6*  PLT 739* 721* 649* 596*    COAGS:  Recent Labs  12/23/15 1813 12/26/15 0527 12/27/15 0152  INR 1.21 1.17 1.51    BMP:  Recent Labs  12/11/15 0354 12/13/15 0832 12/23/15 1813 12/24/15 0328  NA 136 136 135 136  K 3.4* 3.8 4.0 4.0  CL 105 102 103 107  CO2 25 27 22  21*  GLUCOSE 122* 113* 151* 158*  BUN 8 8 5* <5*  CALCIUM 8.5* 8.7* 9.4 8.8*  CREATININE 0.65 0.69 0.82 0.82  GFRNONAA >60 >60 >60 >60  GFRAA >60 >60 >60 >60    LIVER FUNCTION TESTS:  Recent Labs  11/30/15 1558 12/23/15 1813  BILITOT 0.7 0.3  AST 18 25    ALT 11* 23  ALKPHOS 54 92  PROT 7.8 6.7  ALBUMIN 4.1 2.9*    TUMOR MARKERS: No results for input(s): AFPTM, CEA, CA199, CHROMGRNA in the last 8760 hours.  Assessment and Plan:  SHAELYNNE TSUTSUI is a 70 y.o. female with past medical history significant for CHF, anxiety, arthritis and CVA who underwent a distal pancreatectomy on 12/07/2015 with pathology  revealing a microcystic serous adenoma. Patient underwent CT-guided percutaneous drainage catheter placement into a pancreatic bed fluid collection on 12/24/2015 with subsequent contrast enhanced CT scan of the abdomen and pelvis performed 01/06/2016 demonstrating near complete resolution of the peripancreatic fluid collection.    Unfortunately, the patient reports persistent high-volume outputs from the percutaneous drainage catheter recorded at approximately 30-60 mL of fluid per day, similar to the last time she was seen in the interventional radiology drain clinic on 01/06/2016.  As such, the drainage catheter could not be removed at this time.  We will continue to pursue a conservative management and the patient will return to the interventional radiology clinic in 2 weeks for post procedural evaluation and potential percutaneous drainage catheter injection.  The patient was again encouraged to maintain diligent records regarding the output from the percutaneous drainage catheter. Again, she was instructed to no longer flush the percutaneous drainage catheter.  If the patient continues to experience high-volume output from the drainage catheter with injection demonstrating communication to the pancreatic duct, would consider fluoroscopic guided drainage catheter downsizing as indicated.  Patient was encouraged to call the interventional radiology clinic with any interval questions or concerns.  A copy of this report was sent to the requesting provider on this date.  Electronically Signed: Sandi Mariscal 01/20/2016, 11:44 AM   I spent a  total of 10 Minutes in face to face in clinical consultation, greater than 50% of which was counseling/coordinating care for percutaneous drainage catheter management

## 2016-01-21 ENCOUNTER — Telehealth: Payer: Self-pay | Admitting: Hematology

## 2016-01-21 NOTE — Telephone Encounter (Signed)
Pt called to reschedule her appt. Appt was rescheduled w/Kale on 12/12 at 11am per pt request. Pt aware to arrive 30 minutes early. Location given.

## 2016-01-24 ENCOUNTER — Encounter: Payer: Medicare Other | Admitting: Hematology and Oncology

## 2016-01-25 ENCOUNTER — Encounter: Payer: Medicare Other | Admitting: Hematology and Oncology

## 2016-01-26 ENCOUNTER — Ambulatory Visit (INDEPENDENT_AMBULATORY_CARE_PROVIDER_SITE_OTHER)
Admit: 2016-01-26 | Discharge: 2016-01-26 | Disposition: A | Discharge status: Home / Self Care | Payer: Medicare Other | Attending: Emergency Medicine | Admitting: Emergency Medicine

## 2016-01-26 DIAGNOSIS — J849 Interstitial pulmonary disease, unspecified: Secondary | ICD-10-CM | POA: Diagnosis not present

## 2016-01-26 DIAGNOSIS — I5032 Chronic diastolic (congestive) heart failure: Secondary | ICD-10-CM | POA: Diagnosis not present

## 2016-01-26 DIAGNOSIS — Z483 Aftercare following surgery for neoplasm: Secondary | ICD-10-CM | POA: Diagnosis not present

## 2016-01-26 DIAGNOSIS — I110 Hypertensive heart disease with heart failure: Secondary | ICD-10-CM | POA: Diagnosis not present

## 2016-01-26 DIAGNOSIS — Z90411 Acquired partial absence of pancreas: Secondary | ICD-10-CM | POA: Diagnosis not present

## 2016-01-26 DIAGNOSIS — J8410 Pulmonary fibrosis, unspecified: Secondary | ICD-10-CM | POA: Diagnosis not present

## 2016-01-26 DIAGNOSIS — I2729 Other secondary pulmonary hypertension: Secondary | ICD-10-CM | POA: Diagnosis not present

## 2016-02-01 ENCOUNTER — Ambulatory Visit (HOSPITAL_BASED_OUTPATIENT_CLINIC_OR_DEPARTMENT_OTHER): Payer: Medicare Other | Admitting: Hematology

## 2016-02-01 ENCOUNTER — Encounter: Payer: Self-pay | Admitting: Hematology

## 2016-02-01 VITALS — BP 148/65 | HR 65 | Temp 98.5°F | Resp 18 | Ht 65.0 in | Wt 109.3 lb

## 2016-02-01 DIAGNOSIS — D473 Essential (hemorrhagic) thrombocythemia: Secondary | ICD-10-CM | POA: Diagnosis not present

## 2016-02-01 DIAGNOSIS — K55069 Acute infarction of intestine, part and extent unspecified: Secondary | ICD-10-CM

## 2016-02-01 DIAGNOSIS — Z800 Family history of malignant neoplasm of digestive organs: Secondary | ICD-10-CM

## 2016-02-01 DIAGNOSIS — Z801 Family history of malignant neoplasm of trachea, bronchus and lung: Secondary | ICD-10-CM | POA: Diagnosis not present

## 2016-02-01 DIAGNOSIS — IMO0002 Reserved for concepts with insufficient information to code with codable children: Principal | ICD-10-CM

## 2016-02-01 DIAGNOSIS — I81 Portal vein thrombosis: Secondary | ICD-10-CM | POA: Diagnosis not present

## 2016-02-01 DIAGNOSIS — Z87891 Personal history of nicotine dependence: Secondary | ICD-10-CM | POA: Diagnosis not present

## 2016-02-01 NOTE — Progress Notes (Signed)
Marland Kitchen    HEMATOLOGY/ONCOLOGY CONSULTATION NOTE  Date of Service: 02/01/2016  Patient Care Team: Lottie Dawson, MD as PCP - General (Internal Medicine) PCP: Leeroy Cha MD  CHIEF COMPLAINTS/PURPOSE OF CONSULTATION:   Superior mesenteric vein thrombosis postoperatively.  HISTORY OF PRESENTING ILLNESS:   Morgan Cooper is a wonderful 70 y.o. 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  Patient has multiple medical comorbidities as noted below including chronic kidney disease status post renal transplant on Imuran and prednisone, GERD, hypertension, dyslipidemia, sleep apnea, CVA/TIA, Barrett's esophagus, arthritis who underwent a laparoscopic resection of cystic pancreatic tail lesion on 10/70/2017 by Dr. Barry Dienes. 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  Patient had increasing abdominal pain which led to a repeat CT abdomen pelvis on 12/23/2015 which revealed an acute thrombosis of the superior mesenteric vein and the seroma versus abscess in the surgical bed requiring drain placement. Patient still has a drain in place.  Patient was started on IV heparin and transitioned to Coumadin for anticoagulation. Her Coumadin is being managed by her primary care physician at this time and the last levels on 01/26/2016 were subtherapeutic with an INR of 1.2. Dose of Coumadin has been increased and the patient has continued follow-up with primary care physician for anticoagulation management.  She notes that her abdominal pain has continued to improve. No issues with excessive bleeding. She has been referred to [place] to determine duration of anticoagulation.  She reports no previous personal history of venous thromboembolism despite 2 pregnancies and being on oral contraceptive pills for about 6 months to a year. No family history of thrombophilia, venous  thromboembolism. She reports her mother had a CVA in her 50s to 17s and was on Coumadin. Her brother had an aneurysm. No other family history of venous thromboembolism.  She has had an elevated platelet count up to 883k postoperatively with platelet counts being normal prior to her surgery. Her platelet counts have been progressively improving and her down to 593k suggesting that this was likely reactive to her surgery and does not suggest a myeloproliferative neoplasm.  Patient reports no other acute concerns at this time. Patient is having regular bowel movements with no GI bleeding   MEDICAL HISTORY:  Past Medical History:  Diagnosis Date  . Anxiety   . Arthritis    hips, legs, hand   . Barrett's esophagus   . Bone pain   . Bruises easily   . Cancer ([place])    skin cancer that has been removed- L hand  . CHF (congestive heart failure) ([place])   . Chills   . CVA (cerebral infarction)    [place] Strokes   . Depression   . Disturbed concentration   . Diverticulitis of large intestine with perforation Hx colectomy  . Esophageal stricture   . Fatigue   . Forgetfulness   . Gastroparesis   . Generalized headaches   . GERD (gastroesophageal reflux disease)   . Heart failure, diastolic, chronic ([place])   . History of hiatal hernia   . Hx of cardiovascular stress test    Lexiscan Myoview (06/2013):  No ischemia, EF 72%; Low Risk  . Hyperlipemia   . Hypertension   . Irritable   . Itch    skin  . Keratosis   . Macular degeneration    left eye  . Nausea    little vomitting  . Osteoporosis   .  Pancreatic cyst   . Pancreatic mass   . Panic attacks   . Persistent headaches   . Pulmonary hypertension   . Rapid heart rate    some times  . Renal failure    transplant - 1983,  followed by Dr. Justin Mend   . Sleep apnea    uses at bedtime- 2liters/min , negative- apnea., last study- 2/017  . Sleep difficulties   . Stroke ([place])   . TIA (transient ischemic attack)     SURGICAL  HISTORY: Past Surgical History:  Procedure Laterality Date  . APPENDECTOMY    . cataract surgery bilateral    . COLON RESECTION  2010   Colostomy  . COLOSTOMY  2011   Reversal   . colostomy reversal    . ESOPHAGEAL DILATION    . IR GENERIC HISTORICAL  01/06/2016   IR RADIOLOGIST EVAL & MGMT 01/06/2016 Greggory Keen, MD [facility] INTERV RAD  . KIDNEY TRANSPLANT  1984  . left arm surgery     due to injury, two plates in place   . PANCREATECTOMY N/A 12/07/2015   Procedure: HAND ASSISTED LAPAROSCOPIC DISTAL PANCREATECTOMY;  Surgeon: Stark Klein, MD;  Location: [facility];  Service: General;  Laterality: N/A;  . PARATHYROIDECTOMY  11/04/2010   left inferior parathyroid    SOCIAL HISTORY: Social History   Social History  . Marital status: Widowed    Spouse name: N/A  . Number of children: 2  . Years of education: 12   Occupational History  . Retired    Social History Main Topics  . Smoking status: Former Smoker    Packs/day: 1.00    Years: 5.00    Types: Cigarettes    Quit date: 05/30/1989  . Smokeless tobacco: Never Used  . Alcohol use No  . Drug use: No  . Sexual activity: Not on file   Other Topics Concern  . Not on file   Social History Narrative   Patient is widowed with 2 children   Patient is right handed   Patient has a high school education   Patient drinks 3 cups daily          FAMILY HISTORY: Family History  Problem Relation Age of Onset  . Lung cancer Sister   . Heart disease Mother   . Stroke Father   . Aneurysm Brother   . Colon cancer Maternal Aunt 61  . Cancer    . Esophageal cancer Neg Hx   . Rectal cancer Neg Hx   . Stomach cancer Neg Hx     ALLERGIES:  is allergic to amoxicillin and tape.  MEDICATIONS:  Current Outpatient Prescriptions  Medication Sig Dispense Refill  . aspirin 81 MG chewable tablet Chew 81 mg by mouth daily.    Marland Kitchen atorvastatin (LIPITOR) 20 MG tablet Take 20 mg by mouth daily.     Marland Kitchen azaTHIOprine (IMURAN) 50 MG tablet  Take 50 mg by mouth daily.     . cycloSPORINE (RESTASIS) 0.05 % ophthalmic emulsion Place 1 drop into both eyes 2 (two) times daily.      . diazepam (VALIUM) 2 MG tablet Take 2 mg by mouth daily as needed for anxiety.    Marland Kitchen FLUoxetine (PROZAC) 20 MG capsule Take 60 mg by mouth daily.     . furosemide (LASIX) 20 MG tablet TAKE ONE OR TWO TABLETS DAILY (Patient not taking: Reported on 01/06/2016) 30 tablet 6  . HYDROcodone-acetaminophen (NORCO) 10-325 MG per tablet Take 1 tablet by mouth every 6 (  six) hours as needed (pain).     . LORazepam (ATIVAN) 1 MG tablet Take 1 tablet (1 mg total) by mouth every 8 (eight) hours. (Patient not taking: Reported on 01/06/2016) 2 tablet 0  . metoprolol (LOPRESSOR) 50 MG tablet Take 1 tablet (50 mg total) by mouth 2 (two) times daily. Please call and schedule a six month follow up appointment with Dr Irish Lack per last office visit (Patient not taking: Reported on 01/06/2016) 60 tablet 6  . omeprazole (PRILOSEC) 40 MG capsule Take 40 mg by mouth daily.    . ondansetron (ZOFRAN) 4 MG tablet Take 1 tablet (4 mg total) by mouth every 8 (eight) hours as needed for nausea or vomiting. 30 tablet 1  . OVER THE COUNTER MEDICATION Take 2 tablets by mouth 2 (two) times daily. OTC eye vitamin without zinc    . OXYGEN Inhale 1 L into the lungs See admin instructions. Use at bedtime and as needed during activity    . predniSONE (DELTASONE) 5 MG tablet Take 7.5 mg by mouth daily with breakfast.     . Probiotic CAPS Take 1 capsule by mouth daily.    . QUEtiapine (SEROQUEL) 200 MG tablet Take 200 mg by mouth at bedtime.     . sulfamethoxazole-trimethoprim (BACTRIM DS,SEPTRA DS) 800-160 MG tablet Take 2 tablets by mouth every 12 (twelve) hours. Hold trimpex while on this antibiotic.  Restart trimpex once this antibiotic is complete. 40 tablet 0  . Tiotropium Bromide Monohydrate (SPIRIVA RESPIMAT) 2.5 MCG/ACT AERS Inhale 2 puffs into the lungs daily. Reported on 08/11/2015 (Patient not  taking: Reported on 01/06/2016) 1 Inhaler 5  . tizanidine (ZANAFLEX) 2 MG capsule Take 2 mg by mouth at bedtime as needed for muscle spasms.    Marland Kitchen trimethoprim (TRIMPEX) 100 MG tablet Take 1 tablet (100 mg total) by mouth at bedtime. continuous (Patient not taking: Reported on 01/06/2016)    . warfarin (COUMADIN) 2.5 MG tablet Take 1 tablet (2.5 mg total) by mouth daily. Take as directed.  Start one per day. 30 tablet 0   No current facility-administered medications for this visit.     REVIEW OF SYSTEMS:    10 Point review of Systems was done is negative except as noted above.  PHYSICAL EXAMINATION: ECOG PERFORMANCE STATUS: 2 - Symptomatic, <50% confined to bed  . Vitals:   02/01/16 1141  BP: (!) 148/65  Pulse: 65  Resp: 18  Temp: 98.5 F (36.9 C)   Filed Weights   02/01/16 1141  Weight: 109 lb 4.8 oz (49.6 kg)   .Body mass index is 18.19 kg/m.  GENERAL:alert, in no acute distress and comfortable SKIN: No acute rashes  EYES: normal, conjunctiva are pink and non-injected, sclera clear OROPHARYNX:no exudate, no erythema and lips, buccal mucosa, and tongue normal  NECK: supple, no JVD, thyroid normal size, non-tender, without nodularity LYMPH:  no palpable lymphadenopathy in the cervical, axillary or inguinal LUNGS: clear to auscultation with normal respiratory effort HEART: regular rate & rhythm,  no murmurs and no lower extremity edema ABDOMEN: abdomen soft, Abdominal drain in situ , mild tenderness to palpation of her abdomen no guarding rigidity or rebound . Musculoskeletal: no cyanosis of digits and no clubbing  PSYCH: alert & oriented x 3 with fluent speech NEURO: no focal motor/sensory deficits  LABORATORY DATA:  I have reviewed the data as listed  . CBC Latest Ref Rng & Units 12/27/2015 12/26/2015 12/25/2015  WBC 4.0 - 10.5 K/uL 7.6 9.7 11.9(H)  Hemoglobin 12.0 -  15.0 g/dL 9.2(L) 9.6(L) 9.6(L)  Hematocrit 36.0 - 46.0 % 28.6(L) 30.0(L) 29.7(L)  Platelets 150 - 400  K/uL 596(H) 649(H) 721(H)    . CMP Latest Ref Rng & Units 12/24/2015 12/23/2015 12/13/2015  Glucose 65 - 99 mg/dL 158(H) 151(H) 113(H)  BUN 6 - 20 mg/dL <5(L) 5(L) 8  Creatinine 0.44 - 1.00 mg/dL 0.82 0.82 0.69  Sodium 135 - 145 mmol/L 136 135 136  Potassium 3.5 - 5.1 mmol/L 4.0 4.0 3.8  Chloride 101 - 111 mmol/L 107 103 102  CO2 22 - 32 mmol/L 21(L) 22 27  Calcium 8.9 - 10.3 mg/dL 8.8(L) 9.4 8.7(L)  Total Protein 6.5 - 8.1 g/dL - 6.7 -  Total Bilirubin 0.3 - 1.2 mg/dL - 0.3 -  Alkaline Phos 38 - 126 U/L - 92 -  AST 15 - 41 U/L - 25 -  ALT 14 - 54 U/L - 23 -       RADIOGRAPHIC STUDIES: I have personally reviewed the radiological images as listed and agreed with the findings in the report. Ct Abdomen Pelvis W Contrast  Result Date: 01/06/2016 CLINICAL DATA:  Status post distal pancreatectomy for a microcystic serous cystadenoma complicated by pancreatic leak and large fluid collection. Status post percutaneous drainage 12/24/2015. Outpatient follow-up. EXAM: CT ABDOMEN AND PELVIS WITH CONTRAST TECHNIQUE: Multidetector CT imaging of the abdomen and pelvis was performed using the standard protocol following bolus administration of intravenous contrast. CONTRAST:  146mL ISOVUE-300 IOPAMIDOL (ISOVUE-300) INJECTION 61% COMPARISON:  12/24/2015 FINDINGS: Lower chest: Bibasilar fibrosis and bullous emphysema as before. No lower lobe pneumonia, collapse or consolidation. Heart is enlarged. Coronary atherosclerosis noted. No pericardial or pleural effusion. Thoracic aortic atherosclerosis evident. Hepatobiliary: No focal liver abnormality is seen. No gallstones, gallbladder wall thickening, or biliary dilatation. Pancreas: Again, patient is status post distal pancreatectomy. Uncinate process and pancreatic head remain with normal enhancement. Resolution of the left upper quadrant pancreatic surgical site fluid collection status post percutaneous drain. Stable drain catheter position. Minimal  ill-defined and strandy edema/ inflammation in the left upper quadrant surgical site. No new abdominal fluid collections. Spleen: Similar heterogeneous enhancement, suspect degree of evolving splenic infarct in the upper aspect of the spleen. Adrenals/Urinary Tract: Normal adrenal glands for age. Severe atrophy and cystic change of the native kidneys. Left pelvic renal transplant noted. Urinary bladder unremarkable. No renal obstruction or hydronephrosis. Stomach/Bowel: Negative for bowel obstruction, significant dilatation, ileus, or free air. Moderate formed stool throughout the colon. Vascular/Lymphatic: Extensive abdominal atherosclerosis and small infrarenal aortic aneurysm measuring 3 cm as before. Iliac atherosclerosis also noted. No occlusive process. No retroperitoneal hemorrhage. Reproductive: Uterus and bilateral adnexa are unremarkable. Other: No abdominal wall hernia or abnormality. No abdominopelvic ascites. Musculoskeletal: Bones are osteopenic. Degenerative changes of the spine with an associated scoliosis. Grade 1 anterolisthesis of L5 upon S1 without spondylolysis. Advanced degenerative arthropathy of the left hip and acetabulum with cystic formation. IMPRESSION: Resolved left upper quadrant pancreatic surgical bed large fluid collection. Stable drain catheter position. Residual strandy edema/inflammation in in the surgical site. No new abdominal or pelvic fluid collections. Electronically Signed   By: Jerilynn Mages.  Shick M.D.   On: 01/06/2016 16:45   Ct Chest High Resolution  Result Date: 01/26/2016 CLINICAL DATA:  Follow-up interstitial lung disease. 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 EXAM: CT CHEST WITHOUT CONTRAST TECHNIQUE: Multidetector CT imaging of the chest was performed following the standard protocol without intravenous contrast. High resolution imaging of the lungs, as well as inspiratory and expiratory  imaging, was performed. COMPARISON:   02/26/2015 high-resolution chest CT. FINDINGS: Cardiovascular: Mild cardiomegaly. Stable trace pericardial effusion/ thickening. Left main, left anterior descending, left circumflex and right coronary atherosclerosis. Atherosclerotic nonaneurysmal thoracic aorta. Stable dilated main pulmonary artery (3.4 cm diameter). Mediastinum/Nodes: No discrete thyroid nodules. Unremarkable esophagus. No pathologically enlarged axillary, mediastinal or gross hilar lymph nodes, noting limited sensitivity for the detection of hilar adenopathy on this noncontrast study. Lungs/Pleura: No pneumothorax. No pleural effusion. Moderate centrilobular and paraseptal emphysema. Anterior left upper lobe 3 mm pulmonary nodule (series 5/ image 22) is stable since 02/26/2015. No acute consolidative airspace disease, lung masses or new significant pulmonary nodules. There is patchy subpleural reticulation with associated traction bronchiectasis and architectural distortion throughout both lungs with a basilar predominance. There are foci of honeycombing in both lower lobes. As noted on the prior high-resolution chest CT report, these findings were absent at the lung bases on 02/06/2007 CT abdomen/pelvis study. There is slight progression of findings compared to the 02/26/2015 chest CT. No significant lobular air trapping on the expiration sequence. Upper abdomen: Symmetric atrophy of the kidneys. Simple appearing exophytic 1.4 cm renal cyst in the posterior upper left kidney. Partially visualized are postsurgical changes from partial distal pancreatectomy with resection of the cystic distal pancreatic mass seen on the 02/26/2015 chest CT study. Percutaneous surgical drain terminates at the pancreatic resection margin. There is fat stranding throughout the surgical bed. There is focal soft tissue density with central macroscopic fat density adjacent to the surgical bed, most consistent with fat necrosis. No focal drainable fluid collection is  seen in the visualized upper abdomen. Musculoskeletal: No aggressive appearing focal osseous lesions. Moderate thoracic spondylosis. IMPRESSION: 1. 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 2. Mild cardiomegaly.  Stable trace pericardial effusion/thickening. 3. Stable dilated main pulmonary artery, suggesting chronic pulmonary arterial hypertension. 4. Aortic atherosclerosis. Left main and 3 vessel coronary atherosclerosis. 5. Moderate emphysema. 6. Postsurgical changes as described in the left upper quadrant from interval partial distal pancreatectomy. Electronically Signed   By: Ilona Sorrel M.D.   On: 01/26/2016 15:41   Ir Radiologist Eval & Mgmt  Result Date: 01/11/2016 Please refer to notes tab for details about interventional procedure. (Op Note)  CT abdomen pelvis 12/23/2015:  IMPRESSION: 5.2 x 5.6 cm seroma versus abscess within pancreatic body surgical bed. Free fluid about the pancreatic tail surgical bed, given local mass-effect this is concerning for phlegmon.  Acute thrombosis superior mesenteric vein.  Heterogeneous enhancement of the spleen with lack of normal splenic hilar vessels concerning for splenic vascular compromise and infarcts. Alternatively this could for represent early splenic infection.  Enlarging 4.2 cm infrarenal aortic aneurysm. Recommend followup by ultrasound in 1 year. This recommendation follows ACR consensus guidelines: White Paper of the ACR Incidental Findings Committee II on Vascular Findings. J Am Coll Radiol 2013; 10:789-794.  Electronically Signed: By: Elon Alas M.D. On: 12/23/2015 15:41    ASSESSMENT & PLAN:   70 year old Caucasian female with  #1 acute superior mesenteric venous thrombosis diagnosed 12/24/2015 This is clearly triggered her pancreatic surgery done on 12/07/2015 (microcystic serous cystadenoma)  which was complicated with a local postsurgical fluid collection/abscess requiring drainage  In the absence of a personal history of venous thromboembolism and no family history of venous thromboembolism the likelihood of a genetic thrombophilia would be quite unlikely . Patient has had 2 pregnancies and been on oral contraceptive pills in the past without  any issues with VTE  #2 thrombocytosis - postoperatively. Platelet counts were normal preoperatively. This strongly suggests reactive etiology and not a myeloproliferative neoplasm. Her platelet counts have improved from 883k down to the 500k range. Expect this to continue resolving as her post surgical inflammation resolves. PLan -Would recommend continued uninterrupted anticoagulation for 6 months in the absence of any overt thrombophilia or malignancy or ongoing local risk factors for mechanical compression or inflammation . -I discussed the option of doing some basic thrombophilia workup including factor V Leiden and prothrombin gene mutation as well as an antiphospholipid antibody panel with the patient . I reported that my clinical suspicion was low but we could do it for sake of completion of workup . She is not inclined to have this worked up currently which is quite reasonable . She notes "I have to many other medical problems to deal with " -It would be reasonable to place the patient on low-dose aspirin after completion of 6 months of therapeutic anticoagulation . -Continue Coumadin monitoring and management with primary care physician . -Monitor platelet counts for resolution .  Continue follow-up with primary care physician and Dr Barry Dienes for other management.   All of the patients questions were answered with apparent satisfaction. The patient knows to call the clinic with any problems, questions or concerns.  I spent 45 minutes counseling the patient face to face. The total time spent in the appointment was 60 minutes and more than  50% was on counseling and direct patient cares.    Sullivan Lone MD [place] AAHIVMS [facility] Hematology/Oncology Physician [facility]  (Office):       [phone] (Work cell):  [phone] (Fax):           [phone]  02/01/2016 11:50 AM

## 2016-02-02 DIAGNOSIS — I110 Hypertensive heart disease with heart failure: Secondary | ICD-10-CM | POA: Diagnosis not present

## 2016-02-02 DIAGNOSIS — I2729 Other secondary pulmonary hypertension: Secondary | ICD-10-CM | POA: Diagnosis not present

## 2016-02-02 DIAGNOSIS — F330 Major depressive disorder, recurrent, mild: Secondary | ICD-10-CM | POA: Diagnosis not present

## 2016-02-02 DIAGNOSIS — Z90411 Acquired partial absence of pancreas: Secondary | ICD-10-CM | POA: Diagnosis not present

## 2016-02-02 DIAGNOSIS — I5032 Chronic diastolic (congestive) heart failure: Secondary | ICD-10-CM | POA: Diagnosis not present

## 2016-02-02 DIAGNOSIS — Z483 Aftercare following surgery for neoplasm: Secondary | ICD-10-CM | POA: Diagnosis not present

## 2016-02-02 DIAGNOSIS — J8410 Pulmonary fibrosis, unspecified: Secondary | ICD-10-CM | POA: Diagnosis not present

## 2016-02-03 ENCOUNTER — Ambulatory Visit
Admission: RE | Admit: 2016-02-03 | Discharge: 2016-02-03 | Disposition: A | Discharge status: Home / Self Care | Payer: Medicare Other | Source: Ambulatory Visit | Attending: Interventional Radiology | Admitting: Interventional Radiology

## 2016-02-03 ENCOUNTER — Other Ambulatory Visit (HOSPITAL_COMMUNITY): Payer: Self-pay | Admitting: Interventional Radiology

## 2016-02-03 DIAGNOSIS — K8689 Other specified diseases of pancreas: Secondary | ICD-10-CM

## 2016-02-03 DIAGNOSIS — K869 Disease of pancreas, unspecified: Secondary | ICD-10-CM | POA: Diagnosis not present

## 2016-02-03 HISTORY — PX: IR GENERIC HISTORICAL: IMG1180011

## 2016-02-03 NOTE — Progress Notes (Signed)
Chief Complaint: Patient was seen in consultation today for follow up of percutaneous abdominal drain at the request of Stark Klein on 12-07-15.  Referring Physician(s): Stark Klein  Supervising Physician: Daryll Brod  History of Present Illness: Morgan Cooper is a 70 y.o. female well know to the IR service as she had an abdominal drain placed on 12-24-15 by Dr. Annamaria Boots for a fluid collection s/p distal pancreatectomy performed by Dr. Barry Dienes.  She has followed up in clinic a couple of times, but her drain output has remained too high in order to remove her drain.  She is not on antibiotic therapy and not have any fevers.  Her drain is putting out anywhere from 20-40cc of brown output a day.  She is not flushing her drain as directed by [place] the last time she was here.  Past Medical History:  Diagnosis Date  . Anxiety   . Arthritis    hips, legs, hand   . Barrett's esophagus   . Bone pain   . Bruises easily   . Cancer ([place])    skin cancer that has been removed- L hand  . CHF (congestive heart failure) ([place])   . Chills   . CVA (cerebral infarction)    [place] Strokes   . Depression   . Disturbed concentration   . Diverticulitis of large intestine with perforation Hx colectomy  . Esophageal stricture   . Fatigue   . Forgetfulness   . Gastroparesis   . Generalized headaches   . GERD (gastroesophageal reflux disease)   . Heart failure, diastolic, chronic ([place])   . History of hiatal hernia   . Hx of cardiovascular stress test    Lexiscan Myoview (06/2013):  No ischemia, EF 72%; Low Risk  . Hyperlipemia   . Hypertension   . Irritable   . Itch    skin  . Keratosis   . Macular degeneration    left eye  . Nausea    little vomitting  . Osteoporosis   . Pancreatic cyst   . Pancreatic mass   . Panic attacks   . Persistent headaches   . Pulmonary hypertension   . Rapid heart rate    some times  . Renal failure    transplant - 1983,  followed by Dr. Justin Mend   . Sleep apnea     uses at bedtime- 2liters/min , negative- apnea., last study- 2/017  . Sleep difficulties   . Stroke ([place])   . TIA (transient ischemic attack)     Past Surgical History:  Procedure Laterality Date  . APPENDECTOMY    . cataract surgery bilateral    . COLON RESECTION  2010   Colostomy  . COLOSTOMY  2011   Reversal   . colostomy reversal    . ESOPHAGEAL DILATION    . IR GENERIC HISTORICAL  01/06/2016   IR RADIOLOGIST EVAL & MGMT 01/06/2016 Greggory Keen, MD [facility] INTERV RAD  . KIDNEY TRANSPLANT  1984  . left arm surgery     due to injury, two plates in place   . PANCREATECTOMY N/A 12/07/2015   Procedure: HAND ASSISTED LAPAROSCOPIC DISTAL PANCREATECTOMY;  Surgeon: Stark Klein, MD;  Location: [facility];  Service: General;  Laterality: N/A;  . PARATHYROIDECTOMY  11/04/2010   left inferior parathyroid    Allergies: Amoxicillin and Tape  Medications: Prior to Admission medications   Medication Sig Start Date End Date Taking? Authorizing Provider  atorvastatin (LIPITOR) 20 MG tablet Take 20 mg by  mouth daily.     Historical Provider, MD  azaTHIOprine (IMURAN) 50 MG tablet Take 50 mg by mouth daily.     Historical Provider, MD  cycloSPORINE (RESTASIS) 0.05 % ophthalmic emulsion Place 1 drop into both eyes 2 (two) times daily.      Historical Provider, MD  FLUoxetine (PROZAC) 20 MG capsule Take 60 mg by mouth daily.     Historical Provider, MD  furosemide (LASIX) 20 MG tablet TAKE ONE OR TWO TABLETS DAILY 02/09/15   Jettie Booze, MD  HYDROcodone-acetaminophen [facility]) 10-325 MG per tablet Take 1 tablet by mouth every 6 (six) hours as needed (pain).     Historical Provider, MD  metoprolol (LOPRESSOR) 50 MG tablet Take 1 tablet (50 mg total) by mouth 2 (two) times daily. Please call and schedule a six month follow up appointment with Dr Irish Lack per last office visit 06/16/15   Jettie Booze, MD  omeprazole (PRILOSEC) 40 MG capsule Take 40 mg by mouth daily.    Historical  Provider, MD  ondansetron (ZOFRAN) 4 MG tablet Take 1 tablet (4 mg total) by mouth every 8 (eight) hours as needed for nausea or vomiting. 06/02/15   Mauri Pole, MD  OVER THE COUNTER MEDICATION Take 2 tablets by mouth 2 (two) times daily. OTC eye vitamin without zinc    Historical Provider, MD  OXYGEN Inhale 1 L into the lungs See admin instructions. Use at bedtime and as needed during activity    Historical Provider, MD  predniSONE (DELTASONE) 5 MG tablet Take 7.5 mg by mouth daily with breakfast.  05/04/15   Historical Provider, MD  Probiotic CAPS Take 1 capsule by mouth daily.    Historical Provider, MD  QUEtiapine (SEROQUEL) 200 MG tablet Take 200 mg by mouth at bedtime.     Historical Provider, MD  Tiotropium Bromide Monohydrate (SPIRIVA RESPIMAT) 2.5 MCG/ACT AERS Inhale 2 puffs into the lungs daily. Reported on 08/11/2015 08/12/15   [name], MD  trimethoprim (TRIMPEX) 100 MG tablet Take 1 tablet (100 mg total) by mouth at bedtime. continuous 01/06/16   Stark Klein, MD  warfarin (COUMADIN) 2.5 MG tablet Take 1 tablet (2.5 mg total) by mouth daily. Take as directed.  Start one per day. 12/27/15   Stark Klein, MD     Family History  Problem Relation Age of Onset  . Lung cancer Sister   . Heart disease Mother   . Stroke Father   . Aneurysm Brother   . Colon cancer Maternal Aunt 87  . Cancer    . Esophageal cancer Neg Hx   . Rectal cancer Neg Hx   . Stomach cancer Neg Hx     Social History   Social History  . Marital status: Widowed    Spouse name: N/A  . Number of children: 2  . Years of education: 12   Occupational History  . Retired    Social History Main Topics  . Smoking status: Former Smoker    Packs/day: 1.00    Years: 5.00    Types: Cigarettes    Quit date: 05/30/1989  . Smokeless tobacco: Never Used  . Alcohol use No  . Drug use: No  . Sexual activity: Not on file   Other Topics Concern  . Not on file   Social History Narrative   Patient is  widowed with 2 children   Patient is right handed   Patient has a high school education   Patient drinks 3 cups  daily           Review of Systems: A 12 point ROS discussed and pertinent positives are indicated in the HPI above.  All other systems are negative.  Review of Systems  Vital Signs: BP (!) 169/80 (BP Location: Right Arm)   Pulse 80   Temp 98.2 F (36.8 C) (Oral)   SpO2 95%   Physical Exam Gen: NAD Abd: soft, NT, Nd, drain in good position in LUQ/left flank.  Her drain site has some minimally reactive erythema secondary to prolonged drain placement, but no evidence of infection.  Her drain was injected today which did not reveal a definitive pancreatic fistula to her pancreatic duct, but several small accessory pathways that were likely the continued source of drainage present in her bag.  The bag has brown, pancreatic type output present.     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 Status post percutaneous drainage 12/24/2015. Outpatient follow-up. EXAM: CT ABDOMEN AND PELVIS WITH CONTRAST TECHNIQUE: Multidetector CT imaging of the abdomen and pelvis was performed using the standard protocol following bolus administration of intravenous contrast. CONTRAST:  188mL ISOVUE-300 IOPAMIDOL (ISOVUE-300) INJECTION 61% COMPARISON:  12/24/2015 FINDINGS: Lower chest: Bibasilar fibrosis and bullous emphysema as before. No lower lobe pneumonia, collapse or consolidation. Heart is enlarged. Coronary atherosclerosis noted. No pericardial or pleural effusion. Thoracic aortic atherosclerosis evident. Hepatobiliary: No focal liver abnormality is seen. No gallstones, gallbladder wall thickening, or biliary dilatation. Pancreas: Again, patient is status post distal pancreatectomy. Uncinate process and pancreatic head remain with  normal enhancement. Resolution of the left upper quadrant pancreatic surgical site fluid collection status post percutaneous drain. Stable drain catheter position. Minimal ill-defined and strandy edema/ inflammation in the left upper quadrant surgical site. No new abdominal fluid collections. Spleen: Similar heterogeneous enhancement, suspect degree of evolving splenic infarct in the upper aspect of the spleen. Adrenals/Urinary Tract: Normal adrenal glands for age. Severe atrophy and cystic change of the native kidneys. Left pelvic renal transplant noted. Urinary bladder unremarkable. No renal obstruction or hydronephrosis. Stomach/Bowel: Negative for bowel obstruction, significant dilatation, ileus, or free air. Moderate formed stool throughout the colon. Vascular/Lymphatic: Extensive abdominal atherosclerosis and small infrarenal aortic aneurysm measuring 3 cm as before. Iliac atherosclerosis also noted. No occlusive process. No retroperitoneal hemorrhage. Reproductive: Uterus and bilateral adnexa are unremarkable. Other: No abdominal wall hernia or abnormality. No abdominopelvic ascites. Musculoskeletal: Bones are osteopenic. Degenerative changes of the spine with an associated scoliosis. Grade 1 anterolisthesis of L5 upon S1 without spondylolysis. Advanced degenerative arthropathy of the left hip and acetabulum with cystic formation. IMPRESSION: Resolved left upper quadrant pancreatic surgical bed large fluid collection. Stable drain catheter position. Residual strandy edema/inflammation in in the surgical site. No new abdominal or pelvic fluid collections. Electronically Signed   By: Jerilynn Mages.  Shick M.D.   On: 01/06/2016 16:45   Ct Chest High Resolution  Result Date: 01/26/2016 CLINICAL DATA:  Follow-up interstitial lung disease. 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 EXAM: CT CHEST WITHOUT CONTRAST TECHNIQUE: Multidetector CT imaging of the chest was performed following  the standard protocol without intravenous contrast. High resolution imaging of the lungs, as well as inspiratory and expiratory imaging, was performed. COMPARISON:  02/26/2015 high-resolution chest CT. FINDINGS: Cardiovascular: Mild cardiomegaly. Stable trace pericardial effusion/ thickening. Left main, left anterior descending, left circumflex and right coronary atherosclerosis. Atherosclerotic nonaneurysmal thoracic  aorta. Stable dilated main pulmonary artery (3.4 cm diameter). Mediastinum/Nodes: No discrete thyroid nodules. Unremarkable esophagus. No pathologically enlarged axillary, mediastinal or gross hilar lymph nodes, noting limited sensitivity for the detection of hilar adenopathy on this noncontrast study. Lungs/Pleura: No pneumothorax. No pleural effusion. Moderate centrilobular and paraseptal emphysema. Anterior left upper lobe 3 mm pulmonary nodule (series 5/ image 22) is stable since 02/26/2015. No acute consolidative airspace disease, lung masses or new significant pulmonary nodules. There is patchy subpleural reticulation with associated traction bronchiectasis and architectural distortion throughout both lungs with a basilar predominance. There are foci of honeycombing in both lower lobes. As noted on the prior high-resolution chest CT report, these findings were absent at the lung bases on 02/06/2007 CT abdomen/pelvis study. There is slight progression of findings compared to the 02/26/2015 chest CT. No significant lobular air trapping on the expiration sequence. Upper abdomen: Symmetric atrophy of the kidneys. Simple appearing exophytic 1.4 cm renal cyst in the posterior upper left kidney. Partially visualized are postsurgical changes from partial distal pancreatectomy with resection of the cystic distal pancreatic mass seen on the 02/26/2015 chest CT study. Percutaneous surgical drain terminates at the pancreatic resection margin. There is fat stranding throughout the surgical bed. There is focal  soft tissue density with central macroscopic fat density adjacent to the surgical bed, most consistent with fat necrosis. No focal drainable fluid collection is seen in the visualized upper abdomen. Musculoskeletal: No aggressive appearing focal osseous lesions. Moderate thoracic spondylosis. IMPRESSION: 1. 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 2. Mild cardiomegaly.  Stable trace pericardial effusion/thickening. 3. Stable dilated main pulmonary artery, suggesting chronic pulmonary arterial hypertension. 4. Aortic atherosclerosis. Left main and 3 vessel coronary atherosclerosis. 5. Moderate emphysema. 6. Postsurgical changes as described in the left upper quadrant from interval partial distal pancreatectomy. Electronically Signed   By: Ilona Sorrel M.D.   On: 01/26/2016 15:41   Ir Radiologist Eval & Mgmt  Result Date: 01/11/2016 Please refer to notes tab for details about interventional procedure. (Op Note)   Labs:  CBC:  Recent Labs  12/24/15 0328 12/25/15 0028 12/26/15 0527 12/27/15 0152  WBC 13.9* 11.9* 9.7 7.6  HGB 9.4* 9.6* 9.6* 9.2*  HCT 28.7* 29.7* 30.0* 28.6*  PLT 739* 721* 649* 596*    COAGS:  Recent Labs  12/23/15 1813 12/26/15 0527 12/27/15 0152  INR 1.21 1.17 1.51    BMP:  Recent Labs  12/11/15 0354 12/13/15 0832 12/23/15 1813 12/24/15 0328  NA 136 136 135 136  K 3.4* 3.8 4.0 4.0  CL 105 102 103 107  CO2 25 27 22  21*  GLUCOSE 122* 113* 151* 158*  BUN 8 8 5* <5*  CALCIUM 8.5* 8.7* 9.4 8.8*  CREATININE 0.65 0.69 0.82 0.82  GFRNONAA >60 >60 >60 >60  GFRAA >60 >60 >60 >60    LIVER FUNCTION TESTS:  Recent Labs  11/30/15 1558 12/23/15 1813  BILITOT 0.7 0.3  AST 18 25  ALT 11* 23  ALKPHOS 54 92  PROT 7.8 6.7  ALBUMIN 4.1 2.9*    TUMOR MARKERS: No results for input(s): AFPTM, CEA, CA199, CHROMGRNA in the last 8760  hours.  Assessment:  1. S/p distal pancreatectomy with placement of a percutaneous abdominal drain on 12-24-15 by Dr. Annamaria Boots  The patient is doing well overall.  She is not having any issues with her drain.  However, she is still having about 20-40cc of output a day  which by appearance alone, is consistent with continued pancreatic leakage.  The drain injection done today does not show a fistula to her pancreatic duct, but a couple of small tracts leading to her pancreas/pancreatic bed area that are likely the source of her persistent leaking.  We will keep her drain in place, still with no flushing, and she will return in 3-4 weeks with a repeat drain evaluation and drain injection.  She is told that if she stops draining for several days straight to call us before the 3-4 weeks as it's possibly the leaking has stopped and we could inject her then.  She is to follow up with Dr. Barry Dienes on 02-09-16.  She will call as needed in the interim.   Electronically Signed: Henreitta Cea 02/03/2016, 1:44 PM   Please refer to Dr. Fritz Pickerel attestation of this note for management and plan.

## 2016-02-08 ENCOUNTER — Ambulatory Visit (INDEPENDENT_AMBULATORY_CARE_PROVIDER_SITE_OTHER): Payer: Medicare Other | Admitting: Emergency Medicine

## 2016-02-08 ENCOUNTER — Encounter: Payer: Self-pay | Admitting: Emergency Medicine

## 2016-02-08 DIAGNOSIS — J441 Chronic obstructive pulmonary disease with (acute) exacerbation: Secondary | ICD-10-CM

## 2016-02-08 DIAGNOSIS — J849 Interstitial pulmonary disease, unspecified: Secondary | ICD-10-CM | POA: Diagnosis not present

## 2016-02-08 DIAGNOSIS — R110 Nausea: Secondary | ICD-10-CM | POA: Diagnosis not present

## 2016-02-08 DIAGNOSIS — K55069 Acute infarction of intestine, part and extent unspecified: Secondary | ICD-10-CM | POA: Diagnosis not present

## 2016-02-08 DIAGNOSIS — Z7901 Long term (current) use of anticoagulants: Secondary | ICD-10-CM | POA: Diagnosis not present

## 2016-02-08 DIAGNOSIS — I2729 Other secondary pulmonary hypertension: Secondary | ICD-10-CM | POA: Diagnosis not present

## 2016-02-08 NOTE — Assessment & Plan Note (Addendum)
Will defer bronchodilators - she does not believe they help, do cause side effects. We can revisit as we go forward.

## 2016-02-08 NOTE — Assessment & Plan Note (Signed)
Etiology unclear, ? Nitrofurantoin. No evidence GGi or active pneumonitis, infxn (on Imuran and pred for her renal tx). Will follow her on these meds with serial Ct';s, next in 12/18

## 2016-02-08 NOTE — Patient Instructions (Addendum)
We will plan to repeat your Ct scan of the chest in December 2018.  We will not start any new inhaled medications at this time.  Flu shot up to date.  Follow with Dr Lamonte Sakai in 12 months or sooner if you have any problems

## 2016-02-08 NOTE — Progress Notes (Addendum)
Subjective:    Patient ID: Morgan Cooper, female    DOB: [birth year], 70 y.o.   MRN: [id number]  Shortness of Breath  Associated symptoms include wheezing. Pertinent negatives include no ear pain, fever, headaches, leg swelling, rash, rhinorrhea, sore throat or vomiting.   70  yo former tobacco ( 16 pk-yrs) , hx of HTN + diastolic CHF, s/p renal tx on imuran and pred 7.5 qd, GERD / Barrett's / esophageal stricture. She has been having SOB for several years, but certainly worse for the last few months. She has occasional CP both at rest and w exertion. Dr Irish Lack performed a low risk Lexiscan in 5/'15. Occasionally some wheeze. No real cough or sputum. She is on a higher dose metoprolol, starting 1 month ago. She has been tried on BD's in the distant past, not recently.   ROV 04/09/14 -- follow-up visit for dyspnea. She has mixed obstruction and restriction on her function testing so we tried albuterol when necessary to see if she would benefit. She used it about 10 times, didn't notice any real changes in her breathing. She is having back pain and leg pain that play a role.   02/24/15 with [name] --  70 yo former tobacco, hx of HTN + diastolic CHF, s/p renal tx on imuran and pred 7.5 qd, GERD / Barrett's / esophageal stricture. She has been having SOB for several years, but certainly worse for the last few months. She has occasional CP both at rest and w exertion. No real cough or sputum. She has been tried on BD's 11/2014 which she said made no difference to her dyspnea, and she has stopped using them. She has been referred to [place] by [name], NP cardiology as the patient has not been seen by pulmonary in over 1 year, and she has worsening dyspnea, in addition to elevated PAP on recent Echo  03/18/15 -- follow-up visit for history of dyspnea that is multifactorial in the setting of obstructive disease, restrictive disease, diastolic CHF. She also has a history of renal transplant on Imuran and  prednisone. She underwent cardiac stress testing on 02/03/15 that was reassuring. An echocardiogram from 12/13/216 showed normal systolic function, grade 2 diastolic dysfunction, normal right ventricular size and function with an estimated PASP of 59 mmHg. Based on increasing dyspnea over the last several months and and the pulmonary hypertension suggested by her echocardiogram overnight oximetry was performed that confirmed desaturation. She also underwent immune testing that showed a weakly positive ANA (1:80), negative dsDNA, normal ACE level, negative scleroderma antibody, negative RF, negative Sjogren's antibodies. A high-resolution CT scan of the chest performed on 02/26/15 that I have personally reviewed. This shows mild anterior basilar interstitial changes without significant groundglass. There is some traction bronchiectasis and evidence of mild honeycomb change. There is a solitary 3 mm left upper lobe pulmonary nodule that needs to be followed in one year. She tells me that she was on nitrofurantoin from Spring into the fall 2016 for chronic UTI's. She has a family hx lung CA.  Her ONO showed desaturations.   ROV 04/26/15 -- follow-up for multifactorial dyspnea in the setting of interstitial changes on CT scan, obstructive lung disease on pulmonary function testing, nocturnal hypoxemia as documented on overnight oximetry. Since last time I treated her with prednisone 30 mg daily for 4 weeks for possible interstitial disease related to nitrofurantoin. She is unclear whether it has helped her in any way. A sleep study performed on 03/24/15 showed  no evidence of obstructive sleep apnea or central sleep apnea. She did have mild oxygen desaturation which was treated with 1 L/m effectively. She remains on pred 30, did not decrease to 7.5 yet. She has had HA, she is having more eye burning and eye burning. She she has had night sweats.   ROV 08/11/15 -- patient with a history of renal transplant, abnormal CT scan  characterized by interstitial changes, also with obstructive lung disease on poor function testing and nocturnal hypoxemia. 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 She has been well since last visit until about 2 weeks ago - developed URI sx, now with increased chest tightness, congestion, drainage, nausea. Remains on imuran and pred. She is back on methanamine qd for her recurrent UTI's. Stopped Spiriva a month ago - wasn't sure it was helping her.   ROV 02/08/16 -- this follow-up visit for abnormal CT scan of the chest with interstitial lung disease possibly related to nitrofurantoin exposure. She was treated with prednisone and the medication was stopped. Also with a hx COPD. Last visit we restarted Spiriva - she took it for a month, stopped it because she was having chest burning, did not help her breathing. She underwent repeat Ct chest 01/26/16 that I have reviewed. This shows continued patchy subpleural reticulation with associated traction bronchiectasis. There are foci of bilateral lower lobe honeycomb change that were absent on remote CT 2008. Since last visit she had a pancreatic cyst removed, still has a pancreatic drain in place. She still has some reflux sx, has some aspiration sx.   Patient is using home O2 at night while sleeping and appears to be benefiting from it   Review of Systems  Constitutional: Negative for fever and unexpected weight change.  HENT: Positive for trouble swallowing. Negative for congestion, dental problem, ear pain, nosebleeds, postnasal drip, rhinorrhea, sinus pressure, sneezing and sore throat.   Eyes: Negative for redness and itching.  Respiratory: Positive for cough, shortness of breath and wheezing. Negative for chest tightness.   Cardiovascular: Negative for palpitations and leg swelling.  Gastrointestinal: Negative for nausea and vomiting.  Genitourinary: Negative for dysuria.    Musculoskeletal: Negative for arthralgias and joint swelling.  Skin: Negative for rash.  Neurological: Negative for headaches.  Hematological: Does not bruise/bleed easily.  Psychiatric/Behavioral: Negative for dysphoric mood. The patient is nervous/anxious.        Objective:   Physical Exam Vitals:   02/08/16 1112  BP: 122/70  BP Location: Left Arm  Cuff Size: Normal  Pulse: 60  SpO2: 93%  Weight: 107 lb (48.5 kg)  Height: 5\' 5"  (1.651 m)   Gen: Pleasant, well-nourished, in no distress,  normal affect  ENT: No lesions,  mouth clear,  oropharynx clear, no postnasal drip  Neck: No JVD, no TMG, no carotid bruits  Lungs: No use of accessory muscles, clear without rales or rhonchi  Cardiovascular: RRR, heart sounds normal, no murmur or gallops, no peripheral edema  Musculoskeletal: No deformities, no cyanosis or clubbing  Neuro: alert, non focal  Skin: Warm, no lesions or rashes  01/26/16 --  COMPARISON:  02/26/2015 high-resolution chest CT.  FINDINGS: Cardiovascular: Mild cardiomegaly. Stable trace pericardial effusion/ thickening. Left main, left anterior descending, left circumflex and right coronary atherosclerosis. Atherosclerotic nonaneurysmal thoracic aorta. Stable dilated main pulmonary artery (3.4 cm diameter).  Mediastinum/Nodes: No discrete thyroid nodules. Unremarkable esophagus. No pathologically enlarged axillary, mediastinal or gross hilar lymph nodes, noting  limited sensitivity for the detection of hilar adenopathy on this noncontrast study.  Lungs/Pleura: No pneumothorax. No pleural effusion. Moderate centrilobular and paraseptal emphysema. Anterior left upper lobe 3 mm pulmonary nodule (series 5/ image 22) is stable since 02/26/2015. No acute consolidative airspace disease, lung masses or new significant pulmonary nodules. There is patchy subpleural reticulation with associated traction bronchiectasis and architectural distortion throughout  both lungs with a basilar predominance. There are foci of honeycombing in both lower lobes. As noted on the prior high-resolution chest CT report, these findings were absent at the lung bases on 02/06/2007 CT abdomen/pelvis study. There is slight progression of findings compared to the 02/26/2015 chest CT. No significant lobular air trapping on the expiration sequence.  Upper abdomen: Symmetric atrophy of the kidneys. Simple appearing exophytic 1.4 cm renal cyst in the posterior upper left kidney. Partially visualized are postsurgical changes from partial distal pancreatectomy with resection of the cystic distal pancreatic mass seen on the 02/26/2015 chest CT study. Percutaneous surgical drain terminates at the pancreatic resection margin. There is fat stranding throughout the surgical bed. There is focal soft tissue density with central macroscopic fat density adjacent to the surgical bed, most consistent with fat necrosis. No focal drainable fluid collection is seen in the visualized upper abdomen.  Musculoskeletal: No aggressive appearing focal osseous lesions. Moderate thoracic spondylosis.  IMPRESSION: 1. 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 2. Mild cardiomegaly.  Stable trace pericardial effusion/thickening. 3. Stable dilated main pulmonary artery, suggesting chronic pulmonary arterial hypertension. 4. Aortic atherosclerosis. Left main and 3 vessel coronary atherosclerosis. 5. Moderate emphysema. 6. Postsurgical changes as described in the left upper quadrant from interval partial distal pancreatectomy.     Assessment & Plan:  Obstructive lung disease ([place]) Will defer bronchodilators - she does not believe they help, do cause side effects. We can revisit as we go forward.   Interstitial lung disease ([place]) Etiology unclear, ? Nitrofurantoin. No  evidence GGi or active pneumonitis, infxn (on Imuran and pred for her renal tx). Will follow her on these meds with serial Ct';s, next in 12/18  Baltazar Apo, MD, PhD 02/08/2016, 11:44 AM [facility] [phone] or if no answer [phone]

## 2016-02-09 ENCOUNTER — Encounter: Payer: Self-pay | Admitting: Interventional Radiology

## 2016-02-09 DIAGNOSIS — I110 Hypertensive heart disease with heart failure: Secondary | ICD-10-CM | POA: Diagnosis not present

## 2016-02-09 DIAGNOSIS — I5032 Chronic diastolic (congestive) heart failure: Secondary | ICD-10-CM | POA: Diagnosis not present

## 2016-02-09 DIAGNOSIS — Z483 Aftercare following surgery for neoplasm: Secondary | ICD-10-CM | POA: Diagnosis not present

## 2016-02-09 DIAGNOSIS — J8410 Pulmonary fibrosis, unspecified: Secondary | ICD-10-CM | POA: Diagnosis not present

## 2016-02-09 DIAGNOSIS — Z90411 Acquired partial absence of pancreas: Secondary | ICD-10-CM | POA: Diagnosis not present

## 2016-02-09 DIAGNOSIS — I2729 Other secondary pulmonary hypertension: Secondary | ICD-10-CM | POA: Diagnosis not present

## 2016-02-11 ENCOUNTER — Ambulatory Visit (INDEPENDENT_AMBULATORY_CARE_PROVIDER_SITE_OTHER): Payer: Medicare Other | Admitting: Interventional Cardiology

## 2016-02-11 ENCOUNTER — Encounter: Payer: Self-pay | Admitting: Interventional Cardiology

## 2016-02-11 VITALS — BP 122/68 | HR 76 | Ht 65.0 in | Wt 107.8 lb

## 2016-02-11 DIAGNOSIS — I5032 Chronic diastolic (congestive) heart failure: Secondary | ICD-10-CM | POA: Diagnosis not present

## 2016-02-11 DIAGNOSIS — I10 Essential (primary) hypertension: Secondary | ICD-10-CM

## 2016-02-11 DIAGNOSIS — E782 Mixed hyperlipidemia: Secondary | ICD-10-CM | POA: Diagnosis not present

## 2016-02-11 DIAGNOSIS — Z5181 Encounter for therapeutic drug level monitoring: Secondary | ICD-10-CM

## 2016-02-11 DIAGNOSIS — Z7901 Long term (current) use of anticoagulants: Secondary | ICD-10-CM

## 2016-02-11 NOTE — Patient Instructions (Signed)
Medication Instructions:  The current medical regimen is effective;  continue present plan and medications.  Follow-Up: Follow up in 1 year with Dr. Irish Lack.  You will receive a letter in the mail 2 months before you are due.  Please call us when you receive this letter to schedule your follow up appointment.  If you need a refill on your cardiac medications before your next appointment, please call your pharmacy.  Thank you for choosing [facility]!!

## 2016-02-11 NOTE — Progress Notes (Signed)
Cardiology Office Note   Date:  02/11/2016   ID:  Morgan Cooper, Morgan Cooper Mar 25, 1945, MRN [id number]  PCP:  Ileana Roup, MD    No chief complaint on file. f/u chronic diastolic heart failure    Wt Readings from Last 3 Encounters:  02/11/16 107 lb 12.8 oz (48.9 kg)  02/08/16 107 lb (48.5 kg)  02/01/16 109 lb 4.8 oz (49.6 kg)       History of Present Illness: ALETIA TOBAR is a 70 y.o. female   has a history of diastolic HF, HTN, HLD, prior remote kidney transplant, past TIA/strokes and past atrial tach noted on an event monitor in 2015. Other issues as noted below. Negative stress test in 2015. Remote echo with grade 2 diastolic dysfunction from 2013.   She has had a cold recently.    She had part of her pancreas removed due to a cyst.  She lost 20 lbs with that surgery.  She still has an abdominal drain to the pancreas as well.   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      Past Medical History:  Diagnosis Date  . Anxiety   . Arthritis    hips, legs, hand   . Barrett's esophagus   . Bone pain   . Bruises easily   . Cancer ([place])    skin cancer that has been removed- L hand  . CHF (congestive heart failure) ([place])   . Chills   . CVA (cerebral infarction)    Mini Strokes   . Depression   . Disturbed concentration   . Diverticulitis of large intestine with perforation Hx colectomy  . Esophageal stricture   . Fatigue   . Forgetfulness   . Gastroparesis   . Generalized headaches   . GERD (gastroesophageal reflux disease)   . Heart failure, diastolic, chronic ([place])   . History of hiatal hernia   . Hx of cardiovascular stress test    Lexiscan Myoview (06/2013):  No ischemia, EF 72%; Low Risk  . Hyperlipemia   . Hypertension   . Irritable   . Itch    skin  . Keratosis   . Macular degeneration    left eye  . Nausea    little vomitting  . Osteoporosis   . Pancreatic cyst   . Pancreatic mass   . Panic attacks   .  Persistent headaches   . Pulmonary hypertension   . Rapid heart rate    some times  . Renal failure    transplant - 1983,  followed by Dr. Justin Mend   . Sleep apnea    uses at bedtime- 2liters/min , negative- apnea., last study- 2/017  . Sleep difficulties   . Stroke ([place])   . TIA (transient ischemic attack)     Past Surgical History:  Procedure Laterality Date  . APPENDECTOMY    . cataract surgery bilateral    . COLON RESECTION  2010   Colostomy  . COLOSTOMY  2011   Reversal   . colostomy reversal    . ESOPHAGEAL DILATION    . IR GENERIC HISTORICAL  01/06/2016   IR RADIOLOGIST EVAL & MGMT 01/06/2016 Greggory Keen, MD [facility] INTERV RAD  . IR GENERIC HISTORICAL  01/20/2016   IR RADIOLOGIST EVAL & MGMT 01/20/2016 Sandi Mariscal, MD [facility] INTERV RAD  . KIDNEY TRANSPLANT  1984  . left arm surgery     due to injury, two plates in place   .  PANCREATECTOMY N/A 12/07/2015   Procedure: HAND ASSISTED LAPAROSCOPIC DISTAL PANCREATECTOMY;  Surgeon: Stark Klein, MD;  Location: [facility];  Service: General;  Laterality: N/A;  . PARATHYROIDECTOMY  11/04/2010   left inferior parathyroid     Current Outpatient Prescriptions  Medication Sig Dispense Refill  . atorvastatin (LIPITOR) 20 MG tablet Take 20 mg by mouth daily.     Marland Kitchen azaTHIOprine (IMURAN) 50 MG tablet Take 50 mg by mouth daily.     . cycloSPORINE (RESTASIS) 0.05 % ophthalmic emulsion Place 1 drop into both eyes 2 (two) times daily.      Marland Kitchen FLUoxetine (PROZAC) 20 MG capsule Take 60 mg by mouth daily.     Marland Kitchen HYDROcodone-acetaminophen (NORCO) 10-325 MG per tablet Take 1 tablet by mouth every 6 (six) hours as needed (pain).     . metoprolol (LOPRESSOR) 50 MG tablet Take 1 tablet (50 mg total) by mouth 2 (two) times daily. Please call and schedule a six month follow up appointment with Dr Irish Lack per last office visit 60 tablet 6  . omeprazole (PRILOSEC) 40 MG capsule Take 40 mg by mouth daily.    . ondansetron (ZOFRAN) 4 MG tablet Take 1 tablet  (4 mg total) by mouth every 8 (eight) hours as needed for nausea or vomiting. 30 tablet 1  . OVER THE COUNTER MEDICATION Take 2 tablets by mouth 2 (two) times daily. OTC eye vitamin without zinc    . OXYGEN Inhale 1 L into the lungs See admin instructions. Use at bedtime and as needed during activity    . predniSONE (DELTASONE) 5 MG tablet Take 7.5 mg by mouth daily with breakfast.     . Probiotic CAPS Take 1 capsule by mouth daily.    . QUEtiapine (SEROQUEL) 200 MG tablet Take 200 mg by mouth at bedtime.     Marland Kitchen trimethoprim (TRIMPEX) 100 MG tablet Take 1 tablet (100 mg total) by mouth at bedtime. continuous    . warfarin (COUMADIN) 2.5 MG tablet Take 1 tablet (2.5 mg total) by mouth daily. Take as directed.  Start one per day. 30 tablet 0   No current facility-administered medications for this visit.     Allergies:   Amoxicillin and Tape    Social History:  The patient  reports that she quit smoking about 26 years ago. Her smoking use included Cigarettes. She has a 5.00 pack-year smoking history. She has never used smokeless tobacco. She reports that she does not drink alcohol or use drugs.   Family History:  The patient's family history includes Aneurysm in her brother; Colon cancer (age of onset: 27) in her maternal aunt; Heart disease in her mother; Lung cancer in her sister; Stroke in her father.    ROS:  Please see the history of present illness.   Otherwise, review of systems are positive for cold sx .   All other systems are reviewed and negative.    PHYSICAL EXAM: VS:  BP 122/68   Pulse 76   Ht 5\' 5"  (1.651 m)   Wt 107 lb 12.8 oz (48.9 kg)   BMI 17.94 kg/m  , BMI Body mass index is 17.94 kg/m. GEN: Well nourished, well developed, in no acute distress  HEENT: normal  Neck: no JVD, carotid bruits, or masses Cardiac: RRR; no murmurs, rubs, or gallops,no edema  Respiratory:  clear to auscultation bilaterally, normal work of breathing GI: soft, nontender, nondistended, + BS;  drain bag on left side of abdomen MS: no deformity or atrophy  Skin: warm and dry, no rash Neuro:  Strength and sensation are intact Psych: euthymic mood, full affect   EKG:   The ekg ordered today demonstrates NSR, nonspecific ST segment changes   Recent Labs: 12/23/2015: ALT 23; Magnesium 2.1 12/24/2015: BUN <5; Creatinine, Ser 0.82; Potassium 4.0; Sodium 136 12/27/2015: Hemoglobin 9.2; Platelets 596   Lipid Panel    Component Value Date/Time   CHOL 157 01/19/2015 1154   TRIG 131 01/19/2015 1154   HDL 47 01/19/2015 1154   CHOLHDL 3.3 01/19/2015 1154   VLDL 26 01/19/2015 1154   LDLCALC 84 01/19/2015 1154     Other studies Reviewed: Additional studies/ records that were reviewed today with results demonstrating: .   ASSESSMENT AND PLAN:  1. Chronic diastolic heart failure:  She appears euvolemic and off of Lasix. She has some to take if she has fluid retention. She has had significant weight loss in the last year with her surgeries. I would not have her take daily Lasix. She is aware of symptoms of fluid retention due to her history of kidney disease.  2. Anticoagulated for superior mesenteric vein thrombosis. 3. Hyperlipidemia: Controlled on atorvastatin. She will need fasting lipid profile done.  Liver function tests were done in November and were normal. 4. Congestion: OK to use [place] HBP for cold sx.   Current medicines are reviewed at length with the patient today.  The patient concerns regarding her medicines were addressed.  The following changes have been made:  No change  Labs/ tests ordered today include:  No orders of the defined types were placed in this encounter.   Recommend 150 minutes/week of aerobic exercise Low fat, low carb, high fiber diet recommended  Disposition:   FU in 1 year   Signed, Larae Grooms, MD  02/11/2016 2:55 PM    [address], [place], [place]  [address] Phone: [phone]; Fax:  [phone]

## 2016-02-13 DIAGNOSIS — Z483 Aftercare following surgery for neoplasm: Secondary | ICD-10-CM | POA: Diagnosis not present

## 2016-02-13 DIAGNOSIS — Z940 Kidney transplant status: Secondary | ICD-10-CM | POA: Diagnosis not present

## 2016-02-13 DIAGNOSIS — I2729 Other secondary pulmonary hypertension: Secondary | ICD-10-CM | POA: Diagnosis not present

## 2016-02-13 DIAGNOSIS — K558 Other vascular disorders of intestine: Secondary | ICD-10-CM | POA: Diagnosis not present

## 2016-02-13 DIAGNOSIS — Z7952 Long term (current) use of systemic steroids: Secondary | ICD-10-CM | POA: Diagnosis not present

## 2016-02-13 DIAGNOSIS — J8410 Pulmonary fibrosis, unspecified: Secondary | ICD-10-CM | POA: Diagnosis not present

## 2016-02-13 DIAGNOSIS — Z90411 Acquired partial absence of pancreas: Secondary | ICD-10-CM | POA: Diagnosis not present

## 2016-02-13 DIAGNOSIS — I5032 Chronic diastolic (congestive) heart failure: Secondary | ICD-10-CM | POA: Diagnosis not present

## 2016-02-13 DIAGNOSIS — Z7982 Long term (current) use of aspirin: Secondary | ICD-10-CM | POA: Diagnosis not present

## 2016-02-13 DIAGNOSIS — I110 Hypertensive heart disease with heart failure: Secondary | ICD-10-CM | POA: Diagnosis not present

## 2016-02-13 DIAGNOSIS — E43 Unspecified severe protein-calorie malnutrition: Secondary | ICD-10-CM | POA: Diagnosis not present

## 2016-02-16 DIAGNOSIS — E43 Unspecified severe protein-calorie malnutrition: Secondary | ICD-10-CM | POA: Diagnosis not present

## 2016-02-16 DIAGNOSIS — I110 Hypertensive heart disease with heart failure: Secondary | ICD-10-CM | POA: Diagnosis not present

## 2016-02-16 DIAGNOSIS — I5032 Chronic diastolic (congestive) heart failure: Secondary | ICD-10-CM | POA: Diagnosis not present

## 2016-02-16 DIAGNOSIS — K558 Other vascular disorders of intestine: Secondary | ICD-10-CM | POA: Diagnosis not present

## 2016-02-16 DIAGNOSIS — J8410 Pulmonary fibrosis, unspecified: Secondary | ICD-10-CM | POA: Diagnosis not present

## 2016-02-16 DIAGNOSIS — Z483 Aftercare following surgery for neoplasm: Secondary | ICD-10-CM | POA: Diagnosis not present

## 2016-02-17 ENCOUNTER — Ambulatory Visit
Admission: RE | Admit: 2016-02-17 | Discharge: 2016-02-17 | Disposition: A | Discharge status: Home / Self Care | Payer: Medicare Other | Source: Ambulatory Visit | Attending: Nurse Practitioner | Admitting: Nurse Practitioner

## 2016-02-17 ENCOUNTER — Other Ambulatory Visit: Payer: Self-pay | Admitting: Nurse Practitioner

## 2016-02-17 DIAGNOSIS — J069 Acute upper respiratory infection, unspecified: Secondary | ICD-10-CM

## 2016-02-17 DIAGNOSIS — R05 Cough: Secondary | ICD-10-CM | POA: Diagnosis not present

## 2016-02-23 DIAGNOSIS — E43 Unspecified severe protein-calorie malnutrition: Secondary | ICD-10-CM | POA: Diagnosis not present

## 2016-02-23 DIAGNOSIS — J8410 Pulmonary fibrosis, unspecified: Secondary | ICD-10-CM | POA: Diagnosis not present

## 2016-02-23 DIAGNOSIS — K558 Other vascular disorders of intestine: Secondary | ICD-10-CM | POA: Diagnosis not present

## 2016-02-23 DIAGNOSIS — Z483 Aftercare following surgery for neoplasm: Secondary | ICD-10-CM | POA: Diagnosis not present

## 2016-02-23 DIAGNOSIS — I110 Hypertensive heart disease with heart failure: Secondary | ICD-10-CM | POA: Diagnosis not present

## 2016-02-23 DIAGNOSIS — I5032 Chronic diastolic (congestive) heart failure: Secondary | ICD-10-CM | POA: Diagnosis not present

## 2016-02-24 ENCOUNTER — Other Ambulatory Visit (HOSPITAL_COMMUNITY): Payer: Self-pay | Admitting: Interventional Radiology

## 2016-02-24 ENCOUNTER — Other Ambulatory Visit: Payer: Self-pay | Admitting: General Surgery

## 2016-02-24 ENCOUNTER — Ambulatory Visit
Admission: RE | Admit: 2016-02-24 | Discharge: 2016-02-24 | Disposition: A | Discharge status: Home / Self Care | Payer: Medicare Other | Source: Ambulatory Visit | Attending: Interventional Radiology | Admitting: Interventional Radiology

## 2016-02-24 DIAGNOSIS — K8689 Other specified diseases of pancreas: Secondary | ICD-10-CM

## 2016-02-24 DIAGNOSIS — K869 Disease of pancreas, unspecified: Secondary | ICD-10-CM | POA: Diagnosis not present

## 2016-02-24 DIAGNOSIS — Z4803 Encounter for change or removal of drains: Secondary | ICD-10-CM | POA: Diagnosis not present

## 2016-02-24 HISTORY — PX: IR GENERIC HISTORICAL: IMG1180011

## 2016-02-24 NOTE — Progress Notes (Signed)
Chief Complaint: LUQ drain assessment  Referring Physician(s): Dr. Barry Dienes  History of Present Illness: Morgan Cooper is a 71 y.o. female presenting today to vascular and interventional radiology clinic for assessment of existing left upper quadrant drain. She is here with her sister.  She has had a prior distal pancreatectomy performed by Dr. Barry Dienes 12/07/2015. A postoperative fluid collection had formed which required CT guided drainage 12/24/2015.  She has had persistent drainage from the catheter, although has since gone home from the hospital. Today she tells me she has had no fevers or chills. Currently she is not taking antibiotics related to the abdomen, although she was started recently on a trial of antibiotics for a sinus infection after Christmas.  She has had a prior abscess cavity injection performed by my partner Dr. Annamaria Boots, 02/03/2016. At that time the abscess cavity was demonstrated to have collapsed, with insinuating of contrast within the upper abdominal fat.  Today she shows me the detailed log of output, which has persistently been between 20 mL and 60 mL several times per day. This would be greater than a 15-20 mL daily output each day that she has recorded.  Injection of the tube shows [place] there has been slight withdrawal of the catheter from the prior, with the radiopaque marker closer to the abdominal wall. The injection shows similar results to the comparison, with no significant focal abscess cavity, with insinuation throughout the soft tissues of the upper abdomen.  She tells me she has an upcoming appointment with Dr. Barry Dienes on January 12.  Past Medical History:  Diagnosis Date  . Anxiety   . Arthritis    hips, legs, hand   . Barrett's esophagus   . Bone pain   . Bruises easily   . Cancer ([place])    skin cancer that has been removed- L hand  . CHF (congestive heart failure) ([place])   . Chills   . CVA (cerebral infarction)    [place]   .  Depression   . Disturbed concentration   . Diverticulitis of large intestine with perforation Hx colectomy  . Esophageal stricture   . Fatigue   . Forgetfulness   . Gastroparesis   . Generalized headaches   . GERD (gastroesophageal reflux disease)   . Heart failure, diastolic, chronic ([place])   . History of hiatal hernia   . Hx of cardiovascular stress test    Lexiscan Myoview (06/2013):  No ischemia, EF 72%; Low Risk  . Hyperlipemia   . Hypertension   . Irritable   . Itch    skin  . Keratosis   . Macular degeneration    left eye  . Nausea    little vomitting  . Osteoporosis   . Pancreatic cyst   . Pancreatic mass   . Panic attacks   . Persistent headaches   . Pulmonary hypertension   . Rapid heart rate    some times  . Renal failure    transplant - 1983,  followed by Dr. Justin Mend   . Sleep apnea    uses at bedtime- 2liters/min , negative- apnea., last study- 2/017  . Sleep difficulties   . Stroke ([place])   . TIA (transient ischemic attack)     Past Surgical History:  Procedure Laterality Date  . APPENDECTOMY    . cataract surgery bilateral    . COLON RESECTION  2010   Colostomy  . COLOSTOMY  2011   Reversal   . colostomy reversal    .  ESOPHAGEAL DILATION    . IR GENERIC HISTORICAL  01/06/2016   IR RADIOLOGIST EVAL & MGMT 01/06/2016 Greggory Keen, MD [facility] INTERV RAD  . IR GENERIC HISTORICAL  01/20/2016   IR RADIOLOGIST EVAL & MGMT 01/20/2016 Sandi Mariscal, MD [facility] INTERV RAD  . KIDNEY TRANSPLANT  1984  . left arm surgery     due to injury, two plates in place   . PANCREATECTOMY N/A 12/07/2015   Procedure: HAND ASSISTED LAPAROSCOPIC DISTAL PANCREATECTOMY;  Surgeon: Stark Klein, MD;  Location: [place];  Service: General;  Laterality: N/A;  . PARATHYROIDECTOMY  11/04/2010   left inferior parathyroid    Allergies: Amoxicillin and Tape  Medications: Prior to Admission medications   Medication Sig Start Date End Date Taking? Authorizing Provider  atorvastatin  (LIPITOR) 20 MG tablet Take 20 mg by mouth daily.     Historical Provider, MD  azaTHIOprine (IMURAN) 50 MG tablet Take 50 mg by mouth daily.     Historical Provider, MD  cycloSPORINE (RESTASIS) 0.05 % ophthalmic emulsion Place 1 drop into both eyes 2 (two) times daily.      Historical Provider, MD  FLUoxetine (PROZAC) 20 MG capsule Take 60 mg by mouth daily.     Historical Provider, MD  HYDROcodone-acetaminophen (NORCO) 10-325 MG per tablet Take 1 tablet by mouth every 6 (six) hours as needed (pain).     Historical Provider, MD  metoprolol (LOPRESSOR) 50 MG tablet Take 1 tablet (50 mg total) by mouth 2 (two) times daily. Please call and schedule a six month follow up appointment with Dr Irish Lack per last office visit 06/16/15   Jettie Booze, MD  omeprazole (PRILOSEC) 40 MG capsule Take 40 mg by mouth daily.    Historical Provider, MD  ondansetron (ZOFRAN) 4 MG tablet Take 1 tablet (4 mg total) by mouth every 8 (eight) hours as needed for nausea or vomiting. 06/02/15   Mauri Pole, MD  OVER THE COUNTER MEDICATION Take 2 tablets by mouth 2 (two) times daily. OTC eye vitamin without zinc    Historical Provider, MD  OXYGEN Inhale 1 L into the lungs See admin instructions. Use at bedtime and as needed during activity    Historical Provider, MD  predniSONE (DELTASONE) 5 MG tablet Take 7.5 mg by mouth daily with breakfast.  05/04/15   Historical Provider, MD  Probiotic CAPS Take 1 capsule by mouth daily.    Historical Provider, MD  QUEtiapine (SEROQUEL) 200 MG tablet Take 200 mg by mouth at bedtime.     Historical Provider, MD  trimethoprim (TRIMPEX) 100 MG tablet Take 1 tablet (100 mg total) by mouth at bedtime. continuous 01/06/16   Stark Klein, MD  warfarin (COUMADIN) 2.5 MG tablet Take 1 tablet (2.5 mg total) by mouth daily. Take as directed.  Start one per day. 12/27/15   Stark Klein, MD     Family History  Problem Relation Age of Onset  . Lung cancer Sister   . Heart disease Mother     . Stroke Father   . Aneurysm Brother   . Colon cancer Maternal Aunt 42  . Cancer    . Esophageal cancer Neg Hx   . Rectal cancer Neg Hx   . Stomach cancer Neg Hx     Social History   Social History  . Marital status: Widowed    Spouse name: N/A  . Number of children: 2  . Years of education: 12   Occupational History  . Retired  Social History Main Topics  . Smoking status: Former Smoker    Packs/day: 1.00    Years: 5.00    Types: Cigarettes    Quit date: 05/30/1989  . Smokeless tobacco: Never Used  . Alcohol use No  . Drug use: No  . Sexual activity: Not on file   Other Topics Concern  . Not on file   Social History Narrative   Patient is widowed with 2 children   Patient is right handed   Patient has a high school education   Patient drinks 3 cups daily             Review of Systems: A 12 point ROS discussed and pertinent positives are indicated in the HPI above.  All other systems are negative.  Review of Systems  Vital Signs: BP (!) 147/70 (BP Location: Right Arm)   Pulse 92   Temp 98.1 F (36.7 C) (Oral)   SpO2 94%   Physical Exam  Mallampati Score:     Imaging: Dg Chest 2 View  Result Date: 02/17/2016 CLINICAL DATA:  Cough, congestion several days, hx HTN, no smoke EXAM: CHEST  2 VIEW COMPARISON:  Chest CT, 01/26/2016.  Chest radiographs, 05/11/2015. FINDINGS: Cardiac silhouette is mildly enlarged. No mediastinal or hilar masses. No convincing adenopathy. Heterogeneous interstitial thickening, most evident at the left lung base, stable consistent with fibrosis. No evidence of pneumonia or pulmonary edema. No pleural effusion or pneumothorax. Skeletal structures are demineralized but grossly intact. Pigtail percutaneous catheter projects in the left posterior upper abdomen IMPRESSION: 1. No acute cardiopulmonary disease. Electronically Signed   By: Lajean Manes M.D.   On: 02/17/2016 14:45   Ct Chest High Resolution  Result Date:  01/26/2016 CLINICAL DATA:  Follow-up interstitial lung disease. 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 EXAM: CT CHEST WITHOUT CONTRAST TECHNIQUE: Multidetector CT imaging of the chest was performed following the standard protocol without intravenous contrast. High resolution imaging of the lungs, as well as inspiratory and expiratory imaging, was performed. COMPARISON:  02/26/2015 high-resolution chest CT. FINDINGS: Cardiovascular: Mild cardiomegaly. Stable trace pericardial effusion/ thickening. Left main, left anterior descending, left circumflex and right coronary atherosclerosis. Atherosclerotic nonaneurysmal thoracic aorta. Stable dilated main pulmonary artery (3.4 cm diameter). Mediastinum/Nodes: No discrete thyroid nodules. Unremarkable esophagus. No pathologically enlarged axillary, mediastinal or gross hilar lymph nodes, noting limited sensitivity for the detection of hilar adenopathy on this noncontrast study. Lungs/Pleura: No pneumothorax. No pleural effusion. Moderate centrilobular and paraseptal emphysema. Anterior left upper lobe 3 mm pulmonary nodule (series 5/ image 22) is stable since 02/26/2015. No acute consolidative airspace disease, lung masses or new significant pulmonary nodules. There is patchy subpleural reticulation with associated traction bronchiectasis and architectural distortion throughout both lungs with a basilar predominance. There are foci of honeycombing in both lower lobes. As noted on the prior high-resolution chest CT report, these findings were absent at the lung bases on 02/06/2007 CT abdomen/pelvis study. There is slight progression of findings compared to the 02/26/2015 chest CT. No significant lobular air trapping on the expiration sequence. Upper abdomen: Symmetric atrophy of the kidneys. Simple appearing exophytic 1.4 cm renal cyst in the posterior upper left kidney. Partially visualized are postsurgical changes from partial  distal pancreatectomy with resection of the cystic distal pancreatic mass seen on the 02/26/2015 chest CT study. Percutaneous surgical drain terminates at the pancreatic resection margin. There is fat stranding throughout the surgical bed. There is focal soft tissue density with central macroscopic fat density adjacent  to the surgical bed, most consistent with fat necrosis. No focal drainable fluid collection is seen in the visualized upper abdomen. Musculoskeletal: No aggressive appearing focal osseous lesions. Moderate thoracic spondylosis. IMPRESSION: 1. 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 2. Mild cardiomegaly.  Stable trace pericardial effusion/thickening. 3. Stable dilated main pulmonary artery, suggesting chronic pulmonary arterial hypertension. 4. Aortic atherosclerosis. Left main and 3 vessel coronary atherosclerosis. 5. Moderate emphysema. 6. Postsurgical changes as described in the left upper quadrant from interval partial distal pancreatectomy. Electronically Signed   By: Ilona Sorrel M.D.   On: 01/26/2016 15:41   Dg Sinus/fist Tube Chk-non Gi  Result Date: 02/03/2016 INDICATION: Status post distal pancreatectomy, postop pancreatic fluid collection, status post percutaneous drain 12/24/2015 EXAM: ABSCESS INJECTION MEDICATIONS: None. ANESTHESIA/SEDATION: None. COMPLICATIONS: None immediate. PROCEDURE: Under fluoroscopy, the existing left upper quadrant pancreatic fluid collection drain was injected with contrast. Fluoroscopic imaging performed. Pancreatic abscess cavity is collapsed however small residual soft tissue tracts persist throughout the left upper quadrant surgical site but no visualized fistula to the pancreatic duct. IMPRESSION: Collapsed abscess cavity at the pancreatic bed surgical site. Irregular soft tissue sinus tracts throughout the left upper quadrant  surgical site remain but no evidence of fistula to the pancreatic duct. Electronically Signed   By: Jerilynn Mages.  Shick M.D.   On: 02/03/2016 14:00    Labs:  CBC:  Recent Labs  12/24/15 0328 12/25/15 0028 12/26/15 0527 12/27/15 0152  WBC 13.9* 11.9* 9.7 7.6  HGB 9.4* 9.6* 9.6* 9.2*  HCT 28.7* 29.7* 30.0* 28.6*  PLT 739* 721* 649* 596*    COAGS:  Recent Labs  12/23/15 1813 12/26/15 0527 12/27/15 0152  INR 1.21 1.17 1.51    BMP:  Recent Labs  12/11/15 0354 12/13/15 0832 12/23/15 1813 12/24/15 0328  NA 136 136 135 136  K 3.4* 3.8 4.0 4.0  CL 105 102 103 107  CO2 25 27 22  21*  GLUCOSE 122* 113* 151* 158*  BUN 8 8 5* <5*  CALCIUM 8.5* 8.7* 9.4 8.8*  CREATININE 0.65 0.69 0.82 0.82  GFRNONAA >60 >60 >60 >60  GFRAA >60 >60 >60 >60    LIVER FUNCTION TESTS:  Recent Labs  11/30/15 1558 12/23/15 1813  BILITOT 0.7 0.3  AST 18 25  ALT 11* 23  ALKPHOS 54 92  PROT 7.8 6.7  ALBUMIN 4.1 2.9*    TUMOR MARKERS: No results for input(s): AFPTM, CEA, CA199, CHROMGRNA in the last 8760 hours.  Assessment and Plan:  Ms Radon presents for assessment of left upper quadrant drain, which was placed November 3..  The injection again shows there is no focal fluid collection, with a contrast insinuating within the upper abdominal soft tissues. Of note, the catheter has been slightly withdrawn from the comparison, with a radiopaque marker closer to the abdominal wall than previously.  I have encouraged her to observe her upcoming appointment with Dr. Barry Dienes on January 12. At this time I would not advise removing the drain, given that she has persisting fluid greater than 20 mL per day.  We will tentatively schedule her for further abscess injection during the first week of February.  Electronically Signed: Corrie Mckusick 02/24/2016, 11:52 AM   I spent a total of    10 Minutes in face to face in clinical consultation, greater than 50% of which was counseling/coordinating care for  left upper quadrant fluid collection

## 2016-02-25 ENCOUNTER — Encounter: Payer: Self-pay | Admitting: General Surgery

## 2016-03-01 ENCOUNTER — Telehealth: Payer: Self-pay | Admitting: Interventional Cardiology

## 2016-03-01 DIAGNOSIS — E43 Unspecified severe protein-calorie malnutrition: Secondary | ICD-10-CM | POA: Diagnosis not present

## 2016-03-01 DIAGNOSIS — Z483 Aftercare following surgery for neoplasm: Secondary | ICD-10-CM | POA: Diagnosis not present

## 2016-03-01 DIAGNOSIS — I110 Hypertensive heart disease with heart failure: Secondary | ICD-10-CM | POA: Diagnosis not present

## 2016-03-01 DIAGNOSIS — K558 Other vascular disorders of intestine: Secondary | ICD-10-CM | POA: Diagnosis not present

## 2016-03-01 DIAGNOSIS — J8410 Pulmonary fibrosis, unspecified: Secondary | ICD-10-CM | POA: Diagnosis not present

## 2016-03-01 DIAGNOSIS — I5032 Chronic diastolic (congestive) heart failure: Secondary | ICD-10-CM | POA: Diagnosis not present

## 2016-03-01 NOTE — Telephone Encounter (Signed)
**Note De-Identified  Obfuscation** The pts [name], Morgan Cooper, states that the pt was advised by one of her physicians on 01/08/16  to place her Metoprolol on hold due to low BP and weakness. Morgan Cooper is unsure which of the pts MDs told the pt to hold her Metoprolol but states that she has called the pts PCP who advised her to call the pts cardiologist as they did not advise the pt to hold her Metoprolol. I do not see where we advised the pt not to take her Metoprolol as the pt was seen in an OV with Dr Irish Lack on 12/22 and her BP was 122/68 and she did not tell us that her Metoprolol was on hold.  Morgan Cooper states that the pts BP has been elevated for several days and that today it was 166/88. Morgan Cooper states that the pt is nervous to resume Metoprolol because she was so weak when it was placed on hold. Morgan Cooper is advised to have the pt resume her Metoprolol 50 mg once a day, monitor her BP daily and that if her BP remains elevated to go back to Metoprolol 50 mg BID.  Morgan Cooper verbalized understanding and thanked me for my assistance.  Will forward to Dr Irish Lack as Juluis Rainier.

## 2016-03-01 NOTE — Telephone Encounter (Signed)
°  New Prob  Has a question regarding metoprolol (LOPRESSOR) 50 MG tablet medication. Please call.

## 2016-03-07 DIAGNOSIS — G894 Chronic pain syndrome: Secondary | ICD-10-CM | POA: Diagnosis not present

## 2016-03-07 DIAGNOSIS — G8929 Other chronic pain: Secondary | ICD-10-CM | POA: Diagnosis not present

## 2016-03-07 DIAGNOSIS — Z79891 Long term (current) use of opiate analgesic: Secondary | ICD-10-CM | POA: Diagnosis not present

## 2016-03-07 DIAGNOSIS — M5442 Lumbago with sciatica, left side: Secondary | ICD-10-CM | POA: Diagnosis not present

## 2016-03-10 DIAGNOSIS — K558 Other vascular disorders of intestine: Secondary | ICD-10-CM | POA: Diagnosis not present

## 2016-03-10 DIAGNOSIS — Z483 Aftercare following surgery for neoplasm: Secondary | ICD-10-CM | POA: Diagnosis not present

## 2016-03-10 DIAGNOSIS — I110 Hypertensive heart disease with heart failure: Secondary | ICD-10-CM | POA: Diagnosis not present

## 2016-03-10 DIAGNOSIS — E43 Unspecified severe protein-calorie malnutrition: Secondary | ICD-10-CM | POA: Diagnosis not present

## 2016-03-10 DIAGNOSIS — J8410 Pulmonary fibrosis, unspecified: Secondary | ICD-10-CM | POA: Diagnosis not present

## 2016-03-10 DIAGNOSIS — I5032 Chronic diastolic (congestive) heart failure: Secondary | ICD-10-CM | POA: Diagnosis not present

## 2016-03-13 ENCOUNTER — Telehealth: Payer: Self-pay | Admitting: Emergency Medicine

## 2016-03-13 NOTE — Telephone Encounter (Signed)
Spoke with the Jeani Hawking at [facility]  She states pt needing almost due for o2 recert  She wants to know if RB can add an addendum to the 02/08/16 note stating that pt is using noct o2 and benefiting from it  Please advise, thanks

## 2016-03-15 DIAGNOSIS — K558 Other vascular disorders of intestine: Secondary | ICD-10-CM | POA: Diagnosis not present

## 2016-03-15 DIAGNOSIS — Z483 Aftercare following surgery for neoplasm: Secondary | ICD-10-CM | POA: Diagnosis not present

## 2016-03-15 DIAGNOSIS — J8410 Pulmonary fibrosis, unspecified: Secondary | ICD-10-CM | POA: Diagnosis not present

## 2016-03-15 DIAGNOSIS — I110 Hypertensive heart disease with heart failure: Secondary | ICD-10-CM | POA: Diagnosis not present

## 2016-03-15 DIAGNOSIS — E43 Unspecified severe protein-calorie malnutrition: Secondary | ICD-10-CM | POA: Diagnosis not present

## 2016-03-15 DIAGNOSIS — I5032 Chronic diastolic (congestive) heart failure: Secondary | ICD-10-CM | POA: Diagnosis not present

## 2016-03-15 NOTE — Telephone Encounter (Signed)
OV note has been faxed to [facility]. Morgan Cooper is aware of this. Nothing further was needed.

## 2016-03-15 NOTE — Telephone Encounter (Signed)
Addendum made and signed

## 2016-03-16 DIAGNOSIS — K558 Other vascular disorders of intestine: Secondary | ICD-10-CM | POA: Diagnosis not present

## 2016-03-16 DIAGNOSIS — J8410 Pulmonary fibrosis, unspecified: Secondary | ICD-10-CM | POA: Diagnosis not present

## 2016-03-16 DIAGNOSIS — I5032 Chronic diastolic (congestive) heart failure: Secondary | ICD-10-CM | POA: Diagnosis not present

## 2016-03-16 DIAGNOSIS — E43 Unspecified severe protein-calorie malnutrition: Secondary | ICD-10-CM | POA: Diagnosis not present

## 2016-03-16 DIAGNOSIS — I110 Hypertensive heart disease with heart failure: Secondary | ICD-10-CM | POA: Diagnosis not present

## 2016-03-16 DIAGNOSIS — Z483 Aftercare following surgery for neoplasm: Secondary | ICD-10-CM | POA: Diagnosis not present

## 2016-03-22 DIAGNOSIS — I5032 Chronic diastolic (congestive) heart failure: Secondary | ICD-10-CM | POA: Diagnosis not present

## 2016-03-22 DIAGNOSIS — K558 Other vascular disorders of intestine: Secondary | ICD-10-CM | POA: Diagnosis not present

## 2016-03-22 DIAGNOSIS — I110 Hypertensive heart disease with heart failure: Secondary | ICD-10-CM | POA: Diagnosis not present

## 2016-03-22 DIAGNOSIS — E43 Unspecified severe protein-calorie malnutrition: Secondary | ICD-10-CM | POA: Diagnosis not present

## 2016-03-22 DIAGNOSIS — J8410 Pulmonary fibrosis, unspecified: Secondary | ICD-10-CM | POA: Diagnosis not present

## 2016-03-22 DIAGNOSIS — Z483 Aftercare following surgery for neoplasm: Secondary | ICD-10-CM | POA: Diagnosis not present

## 2016-03-23 ENCOUNTER — Ambulatory Visit
Admission: RE | Admit: 2016-03-23 | Discharge: 2016-03-23 | Disposition: A | Discharge status: Home / Self Care | Payer: Medicare Other | Source: Ambulatory Visit | Attending: Interventional Radiology | Admitting: Interventional Radiology

## 2016-03-23 ENCOUNTER — Ambulatory Visit
Admission: RE | Admit: 2016-03-23 | Discharge: 2016-03-23 | Disposition: A | Discharge status: Home / Self Care | Payer: Medicare Other | Source: Ambulatory Visit | Attending: General Surgery | Admitting: General Surgery

## 2016-03-23 ENCOUNTER — Encounter: Payer: Self-pay | Admitting: Radiology

## 2016-03-23 ENCOUNTER — Other Ambulatory Visit: Payer: Self-pay | Admitting: General Surgery

## 2016-03-23 DIAGNOSIS — K869 Disease of pancreas, unspecified: Secondary | ICD-10-CM | POA: Diagnosis not present

## 2016-03-23 DIAGNOSIS — K8689 Other specified diseases of pancreas: Secondary | ICD-10-CM

## 2016-03-23 HISTORY — PX: IR GENERIC HISTORICAL: IMG1180011

## 2016-03-23 NOTE — Progress Notes (Signed)
Chief Complaint: LUQ   Referring Physician(s): [id number]  Supervising Physician: Markus Daft  History of Present Illness: Morgan Cooper is a 71 y.o. female seen today in drain clinic for assessment of existing left upper quadrant drain. She is here with her sister.  She has had a prior distal pancreatectomy performed by Dr. Barry Dienes 12/07/2015. A postoperative fluid collection had formed which required CT guided drainage 12/24/2015 by Dr. Annamaria Boots.  She has had persistent drainage from the catheter.   She has a detailed log of output, which has persistently been between 30 mL and 40 mL per day. This is an improvement since last visit which showed 30-60 mL several times per day.  She reports a low grade fever about 3 days ago, but no fever since. She denies abdominal pain.  Currently she is not taking antibiotics related to the abdomen.  She has had a prior abscess cavity injection performed by Dr. Annamaria Boots, 02/03/2016. At that time the abscess cavity was demonstrated to have collapsed.  Previous injection of the tube by Dr. Earleen Newport showed there had been slight withdrawal of the catheter from the prior, with the radiopaque marker closer to the abdominal wall.   Today the injection shows similar results to the comparison, with no significant focal abscess cavity.  She saw Dr. Barry Dienes on Jan 12 but couldn't really tell me what she said except she told her to eat more protein to try and gain weight.  She tells me she has an upcoming appointment with Dr. Barry Dienes this coming Monday.  We do not have access to those notes and we have called and requested they be faxed to [place].   Past Medical History:  Diagnosis Date  . Anxiety   . Arthritis    hips, legs, hand   . Barrett's esophagus   . Bone pain   . Bruises easily   . Cancer ([place])    skin cancer that has been removed- L hand  . CHF (congestive heart failure) ([place])   . Chills   . CVA (cerebral infarction)    Mini Strokes     . Depression   . Disturbed concentration   . Diverticulitis of large intestine with perforation Hx colectomy  . Esophageal stricture   . Fatigue   . Forgetfulness   . Gastroparesis   . Generalized headaches   . GERD (gastroesophageal reflux disease)   . Heart failure, diastolic, chronic ([place])   . History of hiatal hernia   . Hx of cardiovascular stress test    Lexiscan Myoview (06/2013):  No ischemia, EF 72%; Low Risk  . Hyperlipemia   . Hypertension   . Irritable   . Itch    skin  . Keratosis   . Macular degeneration    left eye  . Nausea    little vomitting  . Osteoporosis   . Pancreatic cyst   . Pancreatic mass   . Panic attacks   . Persistent headaches   . Pulmonary hypertension   . Rapid heart rate    some times  . Renal failure    transplant - 1983,  followed by Dr. Justin Mend   . Sleep apnea    uses at bedtime- 2liters/min , negative- apnea., last study- 2/017  . Sleep difficulties   . Stroke ([place])   . TIA (transient ischemic attack)     Past Surgical History:  Procedure Laterality Date  . APPENDECTOMY    . cataract surgery bilateral    . COLON  RESECTION  2010   Colostomy  . COLOSTOMY  2011   Reversal   . colostomy reversal    . ESOPHAGEAL DILATION    . IR GENERIC HISTORICAL  01/06/2016   IR RADIOLOGIST EVAL & MGMT 01/06/2016 Greggory Keen, MD [facility]  . IR GENERIC HISTORICAL  01/20/2016   IR RADIOLOGIST EVAL & MGMT 01/20/2016 Sandi Mariscal, MD [facility]  . IR GENERIC HISTORICAL  02/03/2016   IR RADIOLOGIST EVAL & MGMT 02/03/2016 Saverio Danker, PA-C [facility]  . IR GENERIC HISTORICAL  02/24/2016   IR RADIOLOGIST EVAL & MGMT 02/24/2016 Corrie Mckusick, DO [facility]  . KIDNEY TRANSPLANT  1984  . left arm surgery     due to injury, two plates in place   . PANCREATECTOMY N/A 12/07/2015   Procedure: HAND ASSISTED LAPAROSCOPIC DISTAL PANCREATECTOMY;  Surgeon: Stark Klein, MD;  Location: [facility];  Service: General;  Laterality: N/A;  .  PARATHYROIDECTOMY  11/04/2010   left inferior parathyroid    Allergies: Amoxicillin and Tape  Medications: Prior to Admission medications   Medication Sig Start Date End Date Taking? Authorizing Provider  atorvastatin (LIPITOR) 20 MG tablet Take 20 mg by mouth daily.     Historical Provider, MD  azaTHIOprine (IMURAN) 50 MG tablet Take 50 mg by mouth daily.     Historical Provider, MD  cycloSPORINE (RESTASIS) 0.05 % ophthalmic emulsion Place 1 drop into both eyes 2 (two) times daily.      Historical Provider, MD  FLUoxetine (PROZAC) 20 MG capsule Take 60 mg by mouth daily.     Historical Provider, MD  HYDROcodone-acetaminophen (NORCO) 10-325 MG per tablet Take 1 tablet by mouth every 6 (six) hours as needed (pain).     Historical Provider, MD  metoprolol (LOPRESSOR) 50 MG tablet Take 1 tablet (50 mg total) by mouth 2 (two) times daily. Please call and schedule a six month follow up appointment with Dr Irish Lack per last office visit 06/16/15   Jettie Booze, MD  omeprazole (PRILOSEC) 40 MG capsule Take 40 mg by mouth daily.    Historical Provider, MD  ondansetron (ZOFRAN) 4 MG tablet Take 1 tablet (4 mg total) by mouth every 8 (eight) hours as needed for nausea or vomiting. 06/02/15   Mauri Pole, MD  OVER THE COUNTER MEDICATION Take 2 tablets by mouth 2 (two) times daily. OTC eye vitamin without zinc    Historical Provider, MD  OXYGEN Inhale 1 L into the lungs See admin instructions. Use at bedtime and as needed during activity    Historical Provider, MD  predniSONE (DELTASONE) 5 MG tablet Take 7.5 mg by mouth daily with breakfast.  05/04/15   Historical Provider, MD  Probiotic CAPS Take 1 capsule by mouth daily.    Historical Provider, MD  QUEtiapine (SEROQUEL) 200 MG tablet Take 200 mg by mouth at bedtime.     Historical Provider, MD  trimethoprim (TRIMPEX) 100 MG tablet Take 1 tablet (100 mg total) by mouth at bedtime. continuous 01/06/16   Stark Klein, MD  warfarin (COUMADIN) 2.5  MG tablet Take 1 tablet (2.5 mg total) by mouth daily. Take as directed.  Start one per day. 12/27/15   Stark Klein, MD     Family History  Problem Relation Age of Onset  . Lung cancer Sister   . Heart disease Mother   . Stroke Father   . Aneurysm Brother   . Colon cancer Maternal Aunt 41  . Cancer    .  Esophageal cancer Neg Hx   . Rectal cancer Neg Hx   . Stomach cancer Neg Hx     Social History   Social History  . Marital status: Widowed    Spouse name: N/A  . Number of children: 2  . Years of education: 12   Occupational History  . Retired    Social History Main Topics  . Smoking status: Former Smoker    Packs/day: 1.00    Years: 5.00    Types: Cigarettes    Quit date: 05/30/1989  . Smokeless tobacco: Never Used  . Alcohol use No  . Drug use: No  . Sexual activity: Not on file   Other Topics Concern  . Not on file   Social History Narrative   Patient is widowed with 2 children   Patient is right handed   Patient has a high school education   Patient drinks 3 cups daily          Review of Systems  Vital Signs: There were no vitals taken for this visit.  Physical Exam  Awake and alert Very thin. NAD Abdomen concave, soft, NT Drain in place LUQ, site OK ~30 mL output since last evening. Output is milky tan in appearance.  Mallampati Score:     Imaging: Dg Sinus/fist Tube Chk-non Gi  Result Date: 02/24/2016 INDICATION: 71 year old female with distal pancreatectomy and fluid collection. Drain was placed November 3rd, and she returns for injection. EXAM: ABSCESS INJECTION MEDICATIONS: None ANESTHESIA/SEDATION: None COMPLICATIONS: None PROCEDURE: Informed written consent was obtained from the patient after a thorough discussion of the procedural risks, benefits and alternatives. Patient positioned supine position. Scout images of the upper abdomen were performed. Contrast was injected through the indwelling tube with images stored and sent to PACs. The  drain was then reattached to gravity drainage. Results were discussed with the patient and the family. IMPRESSION: Status post left upper quadrant drain injection demonstrates that the catheter has been slightly withdrawn from the position from prior drain injection. There is no significant abscess cavity, with contrast insinuating within the soft tissues of the left upper quadrant. Signed, Dulcy Fanny. Earleen Newport DO [facility] Radiology PLAN: She has upcoming appointment with Dr. Barry Dienes January 12. We will tentatively schedule her for repeat drain injection during early February. Electronically Signed   By: Corrie Mckusick D.O.   On: 02/24/2016 12:02   Ir Radiologist Eval & Mgmt  Result Date: 02/25/2016 Please refer to "Notes" to see consult details.   Labs:  CBC:  Recent Labs  12/24/15 0328 12/25/15 0028 12/26/15 0527 12/27/15 0152  WBC 13.9* 11.9* 9.7 7.6  HGB 9.4* 9.6* 9.6* 9.2*  HCT 28.7* 29.7* 30.0* 28.6*  PLT 739* 721* 649* 596*    COAGS:  Recent Labs  12/23/15 1813 12/26/15 0527 12/27/15 0152  INR 1.21 1.17 1.51    BMP:  Recent Labs  12/11/15 0354 12/13/15 0832 12/23/15 1813 12/24/15 0328  NA 136 136 135 136  K 3.4* 3.8 4.0 4.0  CL 105 102 103 107  CO2 25 27 22  21*  GLUCOSE 122* 113* 151* 158*  BUN 8 8 5* <5*  CALCIUM 8.5* 8.7* 9.4 8.8*  CREATININE 0.65 0.69 0.82 0.82  GFRNONAA >60 >60 >60 >60  GFRAA >60 >60 >60 >60    LIVER FUNCTION TESTS:  Recent Labs  11/30/15 1558 12/23/15 1813  BILITOT 0.7 0.3  AST 18 25  ALT 11* 23  ALKPHOS 54 92  PROT 7.8  6.7  ALBUMIN 4.1 2.9*    TUMOR MARKERS: No results for input(s): AFPTM, CEA, CA199, CHROMGRNA in the last 8760 hours.  Assessment:  Left upper quadrant drain, which was placed November 3..  The injection again today shows there is no focal fluid collection and more collapse of the cavity.  She has an upcoming appointment with Dr. Barry Dienes on Monday.    Will leave the drain in place since she has persisting drainage greater than 30 mL per day.  Return in 2 weeks for repeat drain injection.    Electronically Signed: Murrell Redden PA-C 03/23/2016, 11:37 AM   Please refer to Dr. Moises Blood attestation of this note for management and plan.

## 2016-03-27 DIAGNOSIS — K8689 Other specified diseases of pancreas: Secondary | ICD-10-CM | POA: Diagnosis not present

## 2016-03-29 DIAGNOSIS — J8410 Pulmonary fibrosis, unspecified: Secondary | ICD-10-CM | POA: Diagnosis not present

## 2016-03-29 DIAGNOSIS — I110 Hypertensive heart disease with heart failure: Secondary | ICD-10-CM | POA: Diagnosis not present

## 2016-03-29 DIAGNOSIS — K558 Other vascular disorders of intestine: Secondary | ICD-10-CM | POA: Diagnosis not present

## 2016-03-29 DIAGNOSIS — E43 Unspecified severe protein-calorie malnutrition: Secondary | ICD-10-CM | POA: Diagnosis not present

## 2016-03-29 DIAGNOSIS — I5032 Chronic diastolic (congestive) heart failure: Secondary | ICD-10-CM | POA: Diagnosis not present

## 2016-03-29 DIAGNOSIS — F330 Major depressive disorder, recurrent, mild: Secondary | ICD-10-CM | POA: Diagnosis not present

## 2016-03-29 DIAGNOSIS — Z483 Aftercare following surgery for neoplasm: Secondary | ICD-10-CM | POA: Diagnosis not present

## 2016-04-05 DIAGNOSIS — J8410 Pulmonary fibrosis, unspecified: Secondary | ICD-10-CM | POA: Diagnosis not present

## 2016-04-05 DIAGNOSIS — E43 Unspecified severe protein-calorie malnutrition: Secondary | ICD-10-CM | POA: Diagnosis not present

## 2016-04-05 DIAGNOSIS — K558 Other vascular disorders of intestine: Secondary | ICD-10-CM | POA: Diagnosis not present

## 2016-04-05 DIAGNOSIS — Z483 Aftercare following surgery for neoplasm: Secondary | ICD-10-CM | POA: Diagnosis not present

## 2016-04-05 DIAGNOSIS — I5032 Chronic diastolic (congestive) heart failure: Secondary | ICD-10-CM | POA: Diagnosis not present

## 2016-04-05 DIAGNOSIS — I110 Hypertensive heart disease with heart failure: Secondary | ICD-10-CM | POA: Diagnosis not present

## 2016-04-06 ENCOUNTER — Ambulatory Visit
Admission: RE | Admit: 2016-04-06 | Discharge: 2016-04-06 | Disposition: A | Discharge status: Home / Self Care | Payer: Medicare Other | Source: Ambulatory Visit | Attending: General Surgery | Admitting: General Surgery

## 2016-04-06 ENCOUNTER — Other Ambulatory Visit: Payer: Self-pay | Admitting: General Surgery

## 2016-04-06 ENCOUNTER — Encounter: Payer: Self-pay | Admitting: Radiology

## 2016-04-06 DIAGNOSIS — K8689 Other specified diseases of pancreas: Secondary | ICD-10-CM | POA: Diagnosis not present

## 2016-04-06 DIAGNOSIS — K632 Fistula of intestine: Secondary | ICD-10-CM

## 2016-04-06 DIAGNOSIS — K869 Disease of pancreas, unspecified: Secondary | ICD-10-CM | POA: Diagnosis not present

## 2016-04-06 HISTORY — PX: IR GENERIC HISTORICAL: IMG1180011

## 2016-04-06 NOTE — Progress Notes (Signed)
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 Postop she developed a left upper quadrant fluid collection with subsequent drain placement on 12/24/15. Drain fluid cultures were negative. Her most recent drain injection on 03/23/16 revealed persistent irregular collections in the left upper abdomen surrounding the drainage catheter. She presents again today for follow-up drain injection. Since her last visit she has been doing fair. Her appetite is not great. She denies high fevers, vomiting or abnormal bleeding. She does have occasional night sweats, occasional headache, some dyspnea with exertion, intermittent reflux, occasional epigastric/left upper quadrant discomfort as well as intermittent nausea. She was instructed not to flush the drain at her last IR visit. She is on Bactrim for chronic UTIs. Current drain output has increased since her last visit and now averaging approximately 80-90 mL of turbid beige colored fluid per day. She was seen recently by Dr. Barry Dienes and instructed to modify her diet. No further surgical plans were arranged at this time.   Past Medical History:  Diagnosis Date  . Anxiety   . Arthritis    hips, legs, hand   . Barrett's esophagus   . Bone pain   . Bruises easily   . Cancer ([place])    skin cancer that has been removed- L hand  . CHF (congestive heart failure) ([place])   . Chills   . CVA (cerebral infarction)    Mini Strokes   . Depression   . Disturbed concentration   . Diverticulitis of large intestine with perforation Hx colectomy  . Esophageal stricture   . Fatigue   . Forgetfulness   . Gastroparesis   . Generalized headaches   . GERD (gastroesophageal reflux disease)   . Heart failure,  diastolic, chronic ([place])   . History of hiatal hernia   . Hx of cardiovascular stress test    Lexiscan Myoview (06/2013):  No ischemia, EF 72%; Low Risk  . Hyperlipemia   . Hypertension   . Irritable   . Itch    skin  . Keratosis   . Macular degeneration    left eye  . Nausea    little vomitting  . Osteoporosis   . Pancreatic cyst   . Pancreatic mass   . Panic attacks   . Persistent headaches   . Pulmonary hypertension   . Rapid heart rate    some times  . Renal failure    transplant - 1983,  followed by Dr. Justin Mend   . Sleep apnea    uses at bedtime- 2liters/min , negative- apnea., last study- 2/017  . Sleep difficulties   . Stroke ([place])   . TIA (transient ischemic attack)     Past Surgical History:  Procedure Laterality Date  . APPENDECTOMY    . cataract surgery bilateral    . COLON RESECTION  2010   Colostomy  . COLOSTOMY  2011   Reversal   . colostomy reversal    . ESOPHAGEAL DILATION    . IR GENERIC HISTORICAL  01/06/2016   IR RADIOLOGIST EVAL & MGMT 01/06/2016 Greggory Keen, MD [facility]  . IR GENERIC HISTORICAL  01/20/2016   IR RADIOLOGIST EVAL & MGMT 01/20/2016 Sandi Mariscal, MD [facility]  . IR GENERIC HISTORICAL  02/03/2016   IR RADIOLOGIST EVAL &  MGMT 02/03/2016 Saverio Danker, PA-C [facility]  . IR GENERIC HISTORICAL  02/24/2016   IR RADIOLOGIST EVAL & MGMT 02/24/2016 Corrie Mckusick, DO [facility]  . IR GENERIC HISTORICAL  03/23/2016   IR RADIOLOGIST EVAL & MGMT 03/23/2016 Ardis Rowan, PA-C [facility]  . KIDNEY TRANSPLANT  1984  . left arm surgery     due to injury, two plates in place   . PANCREATECTOMY N/A 12/07/2015   Procedure: HAND ASSISTED LAPAROSCOPIC DISTAL PANCREATECTOMY;  Surgeon: Stark Klein, MD;  Location: [facility];  Service: General;  Laterality: N/A;  . PARATHYROIDECTOMY  11/04/2010   left inferior parathyroid    Allergies: Amoxicillin and Tape  Medications: Prior to Admission medications   Medication  Sig Start Date End Date Taking? Authorizing Provider  atorvastatin (LIPITOR) 20 MG tablet Take 20 mg by mouth daily.     Historical Provider, MD  azaTHIOprine (IMURAN) 50 MG tablet Take 50 mg by mouth daily.     Historical Provider, MD  cycloSPORINE (RESTASIS) 0.05 % ophthalmic emulsion Place 1 drop into both eyes 2 (two) times daily.      Historical Provider, MD  FLUoxetine (PROZAC) 20 MG capsule Take 60 mg by mouth daily.     Historical Provider, MD  HYDROcodone-acetaminophen (NORCO) 10-325 MG per tablet Take 1 tablet by mouth every 6 (six) hours as needed (pain).     Historical Provider, MD  metoprolol (LOPRESSOR) 50 MG tablet Take 1 tablet (50 mg total) by mouth 2 (two) times daily. Please call and schedule a six month follow up appointment with Dr Irish Lack per last office visit 06/16/15   Jettie Booze, MD  omeprazole (PRILOSEC) 40 MG capsule Take 40 mg by mouth daily.    Historical Provider, MD  ondansetron (ZOFRAN) 4 MG tablet Take 1 tablet (4 mg total) by mouth every 8 (eight) hours as needed for nausea or vomiting. 06/02/15   Mauri Pole, MD  OVER THE COUNTER MEDICATION Take 2 tablets by mouth 2 (two) times daily. OTC eye vitamin without zinc    Historical Provider, MD  OXYGEN Inhale 1 L into the lungs See admin instructions. Use at bedtime and as needed during activity    Historical Provider, MD  predniSONE (DELTASONE) 5 MG tablet Take 7.5 mg by mouth daily with breakfast.  05/04/15   Historical Provider, MD  Probiotic CAPS Take 1 capsule by mouth daily.    Historical Provider, MD  QUEtiapine (SEROQUEL) 200 MG tablet Take 200 mg by mouth at bedtime.     Historical Provider, MD  trimethoprim (TRIMPEX) 100 MG tablet Take 1 tablet (100 mg total) by mouth at bedtime. continuous 01/06/16   Stark Klein, MD  warfarin (COUMADIN) 2.5 MG tablet Take 1 tablet (2.5 mg total) by mouth daily. Take as directed.  Start one per day. 12/27/15   Stark Klein, MD     Family History  Problem  Relation Age of Onset  . Lung cancer Sister   . Heart disease Mother   . Stroke Father   . Aneurysm Brother   . Colon cancer Maternal Aunt 79  . Cancer    . Esophageal cancer Neg Hx   . Rectal cancer Neg Hx   . Stomach cancer Neg Hx     Social History   Social History  . Marital status: Widowed    Spouse name: N/A  . Number of children: 2  . Years of education: 12   Occupational History  .  Retired    Social History Main Topics  . Smoking status: Former Smoker    Packs/day: 1.00    Years: 5.00    Types: Cigarettes    Quit date: 05/30/1989  . Smokeless tobacco: Never Used  . Alcohol use No  . Drug use: No  . Sexual activity: Not on file   Other Topics Concern  . Not on file   Social History Narrative   Patient is widowed with 2 children   Patient is right handed   Patient has a high school education   Patient drinks 3 cups daily           Vital Signs:  Vitals:   04/06/16 1100  BP: 139/67  Pulse: 95  Temp: 98 F (36.7 C)        Physical Exam patient awake, alert. Chest with few left basilar crackles, right clear. Heart with regular rate and rhythm. Abdomen soft, few bowel sounds, left upper quadrant drain intact, insertion site okay, mildly tender to palpation, approximately 30 mL of beige colored fluid in bag  Imaging: No results found.  Labs:  CBC:  Recent Labs  12/24/15 0328 12/25/15 0028 12/26/15 0527 12/27/15 0152  WBC 13.9* 11.9* 9.7 7.6  HGB 9.4* 9.6* 9.6* 9.2*  HCT 28.7* 29.7* 30.0* 28.6*  PLT 739* 721* 649* 596*    COAGS:  Recent Labs  12/23/15 1813 12/26/15 0527 12/27/15 0152  INR 1.21 1.17 1.51    BMP:  Recent Labs  12/11/15 0354 12/13/15 0832 12/23/15 1813 12/24/15 0328  NA 136 136 135 136  K 3.4* 3.8 4.0 4.0  CL 105 102 103 107  CO2 25 27 22  21*  GLUCOSE 122* 113* 151* 158*  BUN 8 8 5* <5*  CALCIUM 8.5* 8.7* 9.4 8.8*  CREATININE 0.65 0.69 0.82 0.82  GFRNONAA >60 >60 >60 >60  GFRAA >60 >60 >60 >60     LIVER FUNCTION TESTS:  Recent Labs  11/30/15 1558 12/23/15 1813  BILITOT 0.7 0.3  AST 18 25  ALT 11* 23  ALKPHOS 54 92  PROT 7.8 6.7  ALBUMIN 4.1 2.9*    Assessment: Patient  status post distal pancreatectomy on 12/07/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 Developed postop left upper quadrant fluid collection which was drained on 12/24/15. Drain fluid cultures were negative. Follow-up left upper quadrant drain injection today reveals communication with small bowel. See Dr. Moises Blood separate dictation for full details. She was instructed to maintain the current drain; continue not to flush drain but record output and change dressing as needed.  She was told to contact our service with any additional questions or concerns. She will be scheduled for follow-up drain injection in 2 weeks.   Signed: D. Rowe Robert 04/06/2016, 11:26 AM   Please refer to Dr. Moises Blood attestation of this note for management and plan.    I reviewed the history and physical examination and have formulated the assessment and plan in the presence of the patient.  Assessment and Plan:  Residual fluid collection in left upper quadrant with increased drainage and new fistula communication with small bowel.  This is a very difficult situation because the patient has had this drain for a long time and the output is increasing with new bowel fistula.  From IR standpoint, will continue to follow drain output and repeat drain injection in 2 weeks.    A copy of this report was sent to the requesting provider on this date.  Electronically Signed: Carylon Perches 04/06/2016, 12:00  PM   I spent a total of 15 Minutes in face to face in clinical consultation, greater than 50% of which was counseling/coordinating care for drain management.   Patient ID: Morgan Cooper, female   DOB: November 08, 1945, 71 y.o.   MRN: [id number]

## 2016-04-07 ENCOUNTER — Ambulatory Visit
Admission: RE | Admit: 2016-04-07 | Discharge: 2016-04-07 | Disposition: A | Discharge status: Home / Self Care | Payer: Medicare Other | Source: Ambulatory Visit | Attending: General Surgery | Admitting: General Surgery

## 2016-04-07 DIAGNOSIS — K632 Fistula of intestine: Secondary | ICD-10-CM

## 2016-04-07 MED ORDER — IOPAMIDOL (ISOVUE-300) INJECTION 61%
100.0000 mL | Freq: Once | INTRAVENOUS | Status: AC | PRN
  Administered 2016-04-07: 100 mL via INTRAVENOUS

## 2016-04-11 DIAGNOSIS — K632 Fistula of intestine: Secondary | ICD-10-CM | POA: Diagnosis not present

## 2016-04-11 DIAGNOSIS — J849 Interstitial pulmonary disease, unspecified: Secondary | ICD-10-CM | POA: Diagnosis not present

## 2016-04-11 DIAGNOSIS — K861 Other chronic pancreatitis: Secondary | ICD-10-CM | POA: Diagnosis not present

## 2016-04-11 DIAGNOSIS — D638 Anemia in other chronic diseases classified elsewhere: Secondary | ICD-10-CM | POA: Diagnosis not present

## 2016-04-11 DIAGNOSIS — I5032 Chronic diastolic (congestive) heart failure: Secondary | ICD-10-CM | POA: Diagnosis not present

## 2016-04-11 DIAGNOSIS — N185 Chronic kidney disease, stage 5: Secondary | ICD-10-CM | POA: Diagnosis not present

## 2016-04-11 DIAGNOSIS — I10 Essential (primary) hypertension: Secondary | ICD-10-CM | POA: Diagnosis not present

## 2016-04-11 DIAGNOSIS — M6281 Muscle weakness (generalized): Secondary | ICD-10-CM | POA: Diagnosis not present

## 2016-04-11 DIAGNOSIS — I272 Pulmonary hypertension, unspecified: Secondary | ICD-10-CM | POA: Diagnosis not present

## 2016-04-11 DIAGNOSIS — J449 Chronic obstructive pulmonary disease, unspecified: Secondary | ICD-10-CM | POA: Diagnosis not present

## 2016-04-11 DIAGNOSIS — J329 Chronic sinusitis, unspecified: Secondary | ICD-10-CM | POA: Diagnosis not present

## 2016-04-11 DIAGNOSIS — K8689 Other specified diseases of pancreas: Secondary | ICD-10-CM | POA: Diagnosis not present

## 2016-04-12 DIAGNOSIS — K558 Other vascular disorders of intestine: Secondary | ICD-10-CM | POA: Diagnosis not present

## 2016-04-12 DIAGNOSIS — Z483 Aftercare following surgery for neoplasm: Secondary | ICD-10-CM | POA: Diagnosis not present

## 2016-04-12 DIAGNOSIS — E43 Unspecified severe protein-calorie malnutrition: Secondary | ICD-10-CM | POA: Diagnosis not present

## 2016-04-12 DIAGNOSIS — I5032 Chronic diastolic (congestive) heart failure: Secondary | ICD-10-CM | POA: Diagnosis not present

## 2016-04-12 DIAGNOSIS — I110 Hypertensive heart disease with heart failure: Secondary | ICD-10-CM | POA: Diagnosis not present

## 2016-04-12 DIAGNOSIS — J8410 Pulmonary fibrosis, unspecified: Secondary | ICD-10-CM | POA: Diagnosis not present

## 2016-04-13 DIAGNOSIS — K558 Other vascular disorders of intestine: Secondary | ICD-10-CM | POA: Diagnosis not present

## 2016-04-13 DIAGNOSIS — E43 Unspecified severe protein-calorie malnutrition: Secondary | ICD-10-CM | POA: Diagnosis not present

## 2016-04-13 DIAGNOSIS — J8410 Pulmonary fibrosis, unspecified: Secondary | ICD-10-CM | POA: Diagnosis not present

## 2016-04-13 DIAGNOSIS — Z7901 Long term (current) use of anticoagulants: Secondary | ICD-10-CM | POA: Diagnosis not present

## 2016-04-13 DIAGNOSIS — Z90411 Acquired partial absence of pancreas: Secondary | ICD-10-CM | POA: Diagnosis not present

## 2016-04-13 DIAGNOSIS — I5032 Chronic diastolic (congestive) heart failure: Secondary | ICD-10-CM | POA: Diagnosis not present

## 2016-04-13 DIAGNOSIS — Z7952 Long term (current) use of systemic steroids: Secondary | ICD-10-CM | POA: Diagnosis not present

## 2016-04-13 DIAGNOSIS — Z7982 Long term (current) use of aspirin: Secondary | ICD-10-CM | POA: Diagnosis not present

## 2016-04-13 DIAGNOSIS — I110 Hypertensive heart disease with heart failure: Secondary | ICD-10-CM | POA: Diagnosis not present

## 2016-04-13 DIAGNOSIS — Z940 Kidney transplant status: Secondary | ICD-10-CM | POA: Diagnosis not present

## 2016-04-13 DIAGNOSIS — Z483 Aftercare following surgery for neoplasm: Secondary | ICD-10-CM | POA: Diagnosis not present

## 2016-04-13 DIAGNOSIS — I2729 Other secondary pulmonary hypertension: Secondary | ICD-10-CM | POA: Diagnosis not present

## 2016-04-13 DIAGNOSIS — Z5181 Encounter for therapeutic drug level monitoring: Secondary | ICD-10-CM | POA: Diagnosis not present

## 2016-04-17 DIAGNOSIS — K8689 Other specified diseases of pancreas: Secondary | ICD-10-CM | POA: Diagnosis not present

## 2016-04-18 DIAGNOSIS — J8410 Pulmonary fibrosis, unspecified: Secondary | ICD-10-CM | POA: Diagnosis not present

## 2016-04-18 DIAGNOSIS — I110 Hypertensive heart disease with heart failure: Secondary | ICD-10-CM | POA: Diagnosis not present

## 2016-04-18 DIAGNOSIS — Z483 Aftercare following surgery for neoplasm: Secondary | ICD-10-CM | POA: Diagnosis not present

## 2016-04-18 DIAGNOSIS — I5032 Chronic diastolic (congestive) heart failure: Secondary | ICD-10-CM | POA: Diagnosis not present

## 2016-04-18 DIAGNOSIS — K558 Other vascular disorders of intestine: Secondary | ICD-10-CM | POA: Diagnosis not present

## 2016-04-18 DIAGNOSIS — E43 Unspecified severe protein-calorie malnutrition: Secondary | ICD-10-CM | POA: Diagnosis not present

## 2016-04-19 DIAGNOSIS — J8410 Pulmonary fibrosis, unspecified: Secondary | ICD-10-CM | POA: Diagnosis not present

## 2016-04-19 DIAGNOSIS — K558 Other vascular disorders of intestine: Secondary | ICD-10-CM | POA: Diagnosis not present

## 2016-04-19 DIAGNOSIS — I110 Hypertensive heart disease with heart failure: Secondary | ICD-10-CM | POA: Diagnosis not present

## 2016-04-19 DIAGNOSIS — I5032 Chronic diastolic (congestive) heart failure: Secondary | ICD-10-CM | POA: Diagnosis not present

## 2016-04-19 DIAGNOSIS — Z483 Aftercare following surgery for neoplasm: Secondary | ICD-10-CM | POA: Diagnosis not present

## 2016-04-19 DIAGNOSIS — E43 Unspecified severe protein-calorie malnutrition: Secondary | ICD-10-CM | POA: Diagnosis not present

## 2016-04-20 ENCOUNTER — Other Ambulatory Visit: Payer: Self-pay | Admitting: General Surgery

## 2016-04-20 ENCOUNTER — Ambulatory Visit
Admission: RE | Admit: 2016-04-20 | Discharge: 2016-04-20 | Disposition: A | Discharge status: Home / Self Care | Payer: Medicare Other | Source: Ambulatory Visit | Attending: General Surgery | Admitting: General Surgery

## 2016-04-20 DIAGNOSIS — K869 Disease of pancreas, unspecified: Secondary | ICD-10-CM | POA: Diagnosis not present

## 2016-04-20 DIAGNOSIS — K8689 Other specified diseases of pancreas: Secondary | ICD-10-CM

## 2016-04-20 HISTORY — PX: IR GENERIC HISTORICAL: IMG1180011

## 2016-04-20 NOTE — Progress Notes (Signed)
Chief Complaint: Patient was seen in consultation today for f/u drain at the request of [place]  Referring Physician(s): [place]  Supervising Physician: Daryll Brod  History of Present Illness: Morgan Cooper is a 71 y.o. 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   Postop she developed a left upper quadrant fluid collection with subsequent drain placement on 12/24/15.   Drain fluid cultures were negative.   Drain injection on 03/23/16 revealed persistent irregular collections in the left upper abdomen surrounding the drainage catheter.   Drain injection on 04/06/2016 revealed revealed communication with small bowel.  She presents again today for follow-up drain injection.   She was instructed not to flush the drain.  Current drain output has decreased since her last visit and now averaging approximately 30-40 mL of brown/beige colored fluid per day.    Past Medical History:  Diagnosis Date  . Anxiety   . Arthritis    hips, legs, hand   . Barrett's esophagus   . Bone pain   . Bruises easily   . Cancer ([place])    skin cancer that has been removed- L hand  . CHF (congestive heart failure) ([place])   . Chills   . CVA (cerebral infarction)    Mini Strokes   . Depression   . Disturbed concentration   . Diverticulitis of large intestine with perforation Hx colectomy  . Esophageal stricture   . Fatigue   . Forgetfulness   . Gastroparesis   . Generalized headaches   . GERD (gastroesophageal reflux disease)   . Heart failure, diastolic, chronic ([place])   . History of hiatal hernia   . Hx of cardiovascular stress test    Lexiscan Myoview (06/2013):  No ischemia, EF 72%; Low Risk  . Hyperlipemia   . Hypertension   . Irritable   . Itch    skin  . Keratosis   . Macular degeneration    left eye  . Nausea    little vomitting  . Osteoporosis   . Pancreatic cyst   . Pancreatic mass   . Panic attacks     . Persistent headaches   . Pulmonary hypertension   . Rapid heart rate    some times  . Renal failure    transplant - 1983,  followed by Dr. Justin Mend   . Sleep apnea    uses at bedtime- 2liters/min , negative- apnea., last study- 2/017  . Sleep difficulties   . Stroke ([place])   . TIA (transient ischemic attack)     Past Surgical History:  Procedure Laterality Date  . APPENDECTOMY    . cataract surgery bilateral    . COLON RESECTION  2010   Colostomy  . COLOSTOMY  2011   Reversal   . colostomy reversal    . ESOPHAGEAL DILATION    . IR GENERIC HISTORICAL  01/06/2016   IR RADIOLOGIST EVAL & MGMT 01/06/2016 Greggory Keen, MD [facility] INTER[facility]  . IR GENERIC HISTORICAL  01/20/2016   IR RADIOLOGIST EVAL & MGMT 01/20/2016 Sandi Mariscal, MD [facility]  . IR GENERIC HISTORICAL  02/03/2016   IR RADIOLOGIST EVAL & MGMT 02/03/2016 Saverio Danker, PA-C [facility]  . IR GENERIC HISTORICAL  02/24/2016   IR RADIOLOGIST EVAL & MGMT 02/24/2016 Corrie Mckusick, DO [facility]  . IR GENERIC HISTORICAL  03/23/2016   IR RADIOLOGIST EVAL & MGMT 03/23/2016 Ardis Rowan, PA-C [facility]  . IR  GENERIC HISTORICAL  04/06/2016   IR RADIOLOGIST EVAL & MGMT 04/06/2016 Darrell K Allred, PA-C [facility]  . KIDNEY TRANSPLANT  1984  . left arm surgery     due to injury, two plates in place   . PANCREATECTOMY N/A 12/07/2015   Procedure: HAND ASSISTED LAPAROSCOPIC DISTAL PANCREATECTOMY;  Surgeon: Stark Klein, MD;  Location: [facility];  Service: General;  Laterality: N/A;  . PARATHYROIDECTOMY  11/04/2010   left inferior parathyroid    Allergies: Amoxicillin and Tape  Medications: Prior to Admission medications   Medication Sig Start Date End Date Taking? Authorizing Provider  atorvastatin (LIPITOR) 20 MG tablet Take 20 mg by mouth daily.     Historical Provider, MD  azaTHIOprine (IMURAN) 50 MG tablet Take 50 mg by mouth daily.     Historical Provider, MD  cycloSPORINE (RESTASIS) 0.05 %  ophthalmic emulsion Place 1 drop into both eyes 2 (two) times daily.      Historical Provider, MD  FLUoxetine (PROZAC) 20 MG capsule Take 60 mg by mouth daily.     Historical Provider, MD  HYDROcodone-acetaminophen (NORCO) 10-325 MG per tablet Take 1 tablet by mouth every 6 (six) hours as needed (pain).     Historical Provider, MD  metoprolol (LOPRESSOR) 50 MG tablet Take 1 tablet (50 mg total) by mouth 2 (two) times daily. Please call and schedule a six month follow up appointment with Dr Irish Lack per last office visit 06/16/15   Jettie Booze, MD  omeprazole (PRILOSEC) 40 MG capsule Take 40 mg by mouth daily.    Historical Provider, MD  ondansetron (ZOFRAN) 4 MG tablet Take 1 tablet (4 mg total) by mouth every 8 (eight) hours as needed for nausea or vomiting. 06/02/15   Mauri Pole, MD  OVER THE COUNTER MEDICATION Take 2 tablets by mouth 2 (two) times daily. OTC eye vitamin without zinc    Historical Provider, MD  OXYGEN Inhale 1 L into the lungs See admin instructions. Use at bedtime and as needed during activity    Historical Provider, MD  predniSONE (DELTASONE) 5 MG tablet Take 7.5 mg by mouth daily with breakfast.  05/04/15   Historical Provider, MD  Probiotic CAPS Take 1 capsule by mouth daily.    Historical Provider, MD  QUEtiapine (SEROQUEL) 200 MG tablet Take 200 mg by mouth at bedtime.     Historical Provider, MD  trimethoprim (TRIMPEX) 100 MG tablet Take 1 tablet (100 mg total) by mouth at bedtime. continuous 01/06/16   Stark Klein, MD  warfarin (COUMADIN) 2.5 MG tablet Take 1 tablet (2.5 mg total) by mouth daily. Take as directed.  Start one per day. 12/27/15   Stark Klein, MD     Family History  Problem Relation Age of Onset  . Lung cancer Sister   . Heart disease Mother   . Stroke Father   . Aneurysm Brother   . Colon cancer Maternal Aunt 64  . Cancer    . Esophageal cancer Neg Hx   . Rectal cancer Neg Hx   . Stomach cancer Neg Hx     Social History   Social  History  . Marital status: Widowed    Spouse name: N/A  . Number of children: 2  . Years of education: 12   Occupational History  . Retired    Social History Main Topics  . Smoking status: Former Smoker    Packs/day: 1.00    Years: 5.00    Types: Cigarettes  Quit date: 05/30/1989  . Smokeless tobacco: Never Used  . Alcohol use No  . Drug use: No  . Sexual activity: Not on file   Other Topics Concern  . Not on file   Social History Narrative   Patient is widowed with 2 children   Patient is right handed   Patient has a high school education   Patient drinks 3 cups daily          Review of Systems: A 12 point ROS discussed  Review of Systems  Constitutional: Positive for fatigue. Negative for activity change, appetite change, chills and fever.  HENT: Negative.   Respiratory: Negative.   Cardiovascular: Negative.   Gastrointestinal: Positive for abdominal pain. Negative for nausea and vomiting.  Genitourinary: Negative.   Musculoskeletal: Negative.   Skin: Negative.   Neurological: Negative.   Hematological: Negative.   Psychiatric/Behavioral: Negative.     Vital Signs: There were no vitals taken for this visit.  Physical Exam  Constitutional: She is oriented to person, place, and time.  Thin, elderly, frail  HENT:  Head: Normocephalic and atraumatic.  Eyes: EOM are normal.  Neck: Normal range of motion.  Pulmonary/Chest: Effort normal.  Abdominal: Soft.    Drain in place, intact Output ~ 30-40 mL per day brownish/beige fluid.  Musculoskeletal: Normal range of motion.  Neurological: She is alert and oriented to person, place, and time.  Skin: Skin is warm and dry.     Imaging: Ct Abdomen Pelvis W Contrast  Result Date: 04/07/2016 CLINICAL DATA:  Small bowel fistula. EXAM: CT ABDOMEN AND PELVIS WITH CONTRAST TECHNIQUE: Multidetector CT imaging of the abdomen and pelvis was performed using the standard protocol following bolus administration of  intravenous contrast. Creatinine was obtained on site at [facility] at [address] Results: Creatinine 0.7 mg/dL. CONTRAST:  176mL ISOVUE-300 IOPAMIDOL (ISOVUE-300) INJECTION 61% COMPARISON:  01/06/2016 FINDINGS: Lower chest: Chronic cardiomegaly. Chronic lung disease with emphysematous spaces and interstitial coarsening. Hepatobiliary: No focal liver abnormality.No evidence of biliary obstruction or stone. Pancreas: Status post distal pancreatectomy with coarse calcifications of the stomach. Chronic percutaneous catheter in the left upper quadrant with fully formed retention loops, contiguous with a rim enhancing collection measuring 56 mm in length by 25 mm in diameter. Internal material has a semi-solid appearance. Spleen: Unremarkable. Adrenals/Urinary Tract: Negative adrenals. Native renal atrophy. Stable appearance of presumed complex renal cyst in the upper pole on the left. Transplant kidney in the left lower quadrant with chronic upper pole scarring and pelviectasis, stable. Unremarkable bladder. Stomach/Bowel: Prominent formed stool in the sigmoid and cecum. No obstruction or visible inflammation. 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 Vascular/Lymphatic: Extensive atherosclerosis with diffuse aneurysmal enlargement aorta. The distal aorta measures up to 41 mm in diameter, unchanged based on coronal reformats. Extensive atherosclerotic plaque in the proximal SMA with multiple vascular collaterals seen at the pancreatic head. Postoperative distortion of the portal venous system. Reproductive:No pathologic findings. Other: No ascites or pneumoperitoneum. Musculoskeletal: No acute abnormalities. Severe left hip osteoarthritis with posterior joint narrowing and subchondral cysts. Underlying avascular necrosis may be present. Lumbar spine degeneration with scoliosis IMPRESSION: Left  upper quadrant thick walled collection around the percutaneous catheter is 56 x 25 mm and contains semi-solid material. 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 Electronically Signed   By: Angelica Chessman  Watts M.D.   On: 04/07/2016 14:51   Dg Sinus/fist Tube Chk-non Gi  Result Date: 04/06/2016 INDICATION: History of distal pancreatectomy and persistent drainage from a left upper quadrant fluid collection. Patient reports increased drainage volume since the last appointment. EXAM: ABSCESS DRAIN INJECTION MEDICATIONS: None ANESTHESIA/SEDATION: None FLUOROSCOPY TIME:  8 seconds, 49 mGy CONTRAST:  15 mL Omnipaque [id number] COMPLICATIONS: None immediate. PROCEDURE: Patient was placed supine on the fluoroscopic table. The drain was injected with contrast under fluoroscopic evaluation. Drain was attached to gravity bag at the end of the procedure. FINDINGS: Percutaneous drain remains in the left upper quadrant and no significant change in the position. Contrast injection demonstrates filling of an irregular fluid collection around the catheter which is similar to the previous examination but now there is a fistula tract at a loop of small bowel in the left abdomen. IMPRESSION: New fistula connection between the left upper quadrant fluid collection and small bowel. Residual irregular fluid collection surrounding the drain. Patient will be scheduled for follow-up drain injection in approximately 2 weeks. Electronically Signed   By: Markus Daft M.D.   On: 04/06/2016 11:46   Dg Sinus/fist Tube Chk-non Gi  Result Date: 03/23/2016 INDICATION: History of distal pancreatectomy and long-term postoperative fluid collection with percutaneous drainage catheter. Patient continues to have 30-40 mL of fluid drainage per day. EXAM: ABSCESS INJECTION MEDICATIONS: None ANESTHESIA/SEDATION: None COMPLICATIONS: None immediate. FLUOROSCOPY TIME:  42 seconds, 2 mGy  CONTRAST:  3 mL Omnipaque 300 PROCEDURE: The left abdominal drain was injected with contrast under fluoroscopy. Drain was flushed at the procedure and attached to drainage bag. FINDINGS: Contrast fills multiple small cavities or channels around the percutaneous drain. There is not a large focal abscess collection. The morphology of this irregular collection is similar to previous study. IMPRESSION: Persistent irregular collections in the left upper abdomen surrounding the percutaneous drainage catheter. Catheter was left in place. Electronically Signed   By: Markus Daft M.D.   On: 03/23/2016 16:49   Ir Radiologist Eval & Mgmt  Result Date: 04/06/2016 Please refer to "Notes" to see consult details.  Ir Radiologist Eval & Mgmt  Result Date: 03/23/2016 Please refer to "Notes" to see consult details.   Labs:  CBC:  Recent Labs  12/24/15 0328 12/25/15 0028 12/26/15 0527 12/27/15 0152  WBC 13.9* 11.9* 9.7 7.6  HGB 9.4* 9.6* 9.6* 9.2*  HCT 28.7* 29.7* 30.0* 28.6*  PLT 739* 721* 649* 596*    COAGS:  Recent Labs  12/23/15 1813 12/26/15 0527 12/27/15 0152  INR 1.21 1.17 1.51    BMP:  Recent Labs  12/11/15 0354 12/13/15 0832 12/23/15 1813 12/24/15 0328  NA 136 136 135 136  K 3.4* 3.8 4.0 4.0  CL 105 102 103 107  CO2 25 27 22  21*  GLUCOSE 122* 113* 151* 158*  BUN 8 8 5* <5*  CALCIUM 8.5* 8.7* 9.4 8.8*  CREATININE 0.65 0.69 0.82 0.82  GFRNONAA >60 >60 >60 >60  GFRAA >60 >60 >60 >60    LIVER FUNCTION TESTS:  Recent Labs  11/30/15 1558 12/23/15 1813  BILITOT 0.7 0.3  AST 18 25  ALT 11* 23  ALKPHOS 54 92  PROT 7.8 6.7  ALBUMIN 4.1 2.9*    TUMOR MARKERS: No results for input(s): AFPTM, CEA, CA199, CHROMGRNA in the last 8760 hours.  Assessment:  Status post distal pancreatectomy on 12/07/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   Developed postop left upper quadrant fluid collection which was drained on 12/24/15. Drain fluid cultures  were negative.  Follow-up  left upper quadrant drain injection today reveals NO communication with small bowel. It appears the previous fistula has healed.  Given the continued amount of output daily, will leave the drain in place and have her follow up again in 2 weeks for another injection.  She understands to continue routine drain care and documenting output daily.   Electronically Signed: Murrell Redden PA-C 04/20/2016, 10:49 AM   Please refer to Dr. Fritz Pickerel attestation of this note for management and plan.

## 2016-04-26 DIAGNOSIS — I110 Hypertensive heart disease with heart failure: Secondary | ICD-10-CM | POA: Diagnosis not present

## 2016-04-26 DIAGNOSIS — I5032 Chronic diastolic (congestive) heart failure: Secondary | ICD-10-CM | POA: Diagnosis not present

## 2016-04-26 DIAGNOSIS — E43 Unspecified severe protein-calorie malnutrition: Secondary | ICD-10-CM | POA: Diagnosis not present

## 2016-04-26 DIAGNOSIS — K558 Other vascular disorders of intestine: Secondary | ICD-10-CM | POA: Diagnosis not present

## 2016-04-26 DIAGNOSIS — Z483 Aftercare following surgery for neoplasm: Secondary | ICD-10-CM | POA: Diagnosis not present

## 2016-04-26 DIAGNOSIS — J8410 Pulmonary fibrosis, unspecified: Secondary | ICD-10-CM | POA: Diagnosis not present

## 2016-04-28 ENCOUNTER — Encounter: Payer: Self-pay | Admitting: Interventional Radiology

## 2016-05-03 ENCOUNTER — Ambulatory Visit
Admission: RE | Admit: 2016-05-03 | Discharge: 2016-05-03 | Disposition: A | Discharge status: Home / Self Care | Payer: Medicare Other | Source: Ambulatory Visit | Attending: General Surgery | Admitting: General Surgery

## 2016-05-03 DIAGNOSIS — K8689 Other specified diseases of pancreas: Secondary | ICD-10-CM

## 2016-05-03 DIAGNOSIS — K9189 Other postprocedural complications and disorders of digestive system: Secondary | ICD-10-CM | POA: Diagnosis not present

## 2016-05-03 HISTORY — PX: IR RADIOLOGIST EVAL & MGMT: IMG5224

## 2016-05-03 NOTE — Progress Notes (Signed)
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 Drain fluid cultures were negative. Drain injection on 03/23/16 revealedpersistent irregular collections in the left upper abdomen surrounding the drainage catheter. Drain injection on 04/06/2016 revealed revealed communication with small bowel. 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 She presents again today for follow-up . According to the patient she has been eating better since her last visit. Drain output averages between 20-40 mL per day of turbid beige colored fluid. She continues not to flush drain as instructed. Otherwise she reports no significant clinical changes.    Past Medical History:  Diagnosis Date  . Anxiety   . Arthritis    hips, legs, hand   . Barrett's esophagus   . Bone pain   . Bruises easily   . Cancer ([place])    skin cancer that has been removed- L hand  . CHF (congestive heart failure) ([place])   . Chills   . CVA (cerebral infarction)    Mini Strokes   . Depression   . Disturbed concentration   . Diverticulitis of large intestine with perforation Hx colectomy  . Esophageal stricture   . Fatigue   . Forgetfulness   . Gastroparesis   . Generalized headaches   . GERD (gastroesophageal reflux disease)   . Heart failure, diastolic, chronic ([place])   . History of hiatal hernia   . Hx of cardiovascular stress test    Lexiscan Myoview (06/2013):  No ischemia, EF 72%; Low Risk  . Hyperlipemia   . Hypertension   . Irritable   . Itch    skin  . Keratosis   .  Macular degeneration    left eye  . Nausea    little vomitting  . Osteoporosis   . Pancreatic cyst   . Pancreatic mass   . Panic attacks   . Persistent headaches   . Pulmonary hypertension   . Rapid heart rate    some times  . Renal failure    transplant - 1983,  followed by Dr. Justin Mend   . Sleep apnea    uses at bedtime- 2liters/min , negative- apnea., last study- 2/017  . Sleep difficulties   . Stroke ([place])   . TIA (transient ischemic attack)     Past Surgical History:  Procedure Laterality Date  . APPENDECTOMY    . cataract surgery bilateral    . COLON RESECTION  2010   Colostomy  . COLOSTOMY  2011   Reversal   . colostomy reversal    . ESOPHAGEAL DILATION    . IR GENERIC HISTORICAL  01/06/2016   IR RADIOLOGIST EVAL & MGMT 01/06/2016 Greggory Keen, MD [facility]  . IR GENERIC HISTORICAL  01/20/2016   IR RADIOLOGIST EVAL & MGMT 01/20/2016 Sandi Mariscal, MD [facility]  . IR GENERIC HISTORICAL  02/03/2016   IR RADIOLOGIST EVAL & MGMT 02/03/2016 Saverio Danker, PA-C [facility]  . IR GENERIC HISTORICAL  02/24/2016   IR RADIOLOGIST EVAL & MGMT 02/24/2016 Corrie Mckusick, DO [facility]  . IR GENERIC HISTORICAL  03/23/2016   IR RADIOLOGIST EVAL & MGMT 03/23/2016  Ardis Rowan, PA-C [facility]  . IR GENERIC HISTORICAL  04/06/2016   IR RADIOLOGIST EVAL & MGMT 04/06/2016 Darrell K Allred, PA-C [facility]  . IR GENERIC HISTORICAL  04/20/2016   IR RADIOLOGIST EVAL & MGMT 04/20/2016 Greggory Keen, MD [facility]  . KIDNEY TRANSPLANT  1984  . left arm surgery     due to injury, two plates in place   . PANCREATECTOMY N/A 12/07/2015   Procedure: HAND ASSISTED LAPAROSCOPIC DISTAL PANCREATECTOMY;  Surgeon: Stark Klein, MD;  Location: [facility];  Service: General;  Laterality: N/A;  . PARATHYROIDECTOMY  11/04/2010   left inferior parathyroid    Allergies: Amoxicillin and Tape  Medications: Prior to Admission medications   Medication Sig Start Date  End Date Taking? Authorizing Provider  atorvastatin (LIPITOR) 20 MG tablet Take 20 mg by mouth daily.     Historical Provider, MD  azaTHIOprine (IMURAN) 50 MG tablet Take 50 mg by mouth daily.     Historical Provider, MD  cycloSPORINE (RESTASIS) 0.05 % ophthalmic emulsion Place 1 drop into both eyes 2 (two) times daily.      Historical Provider, MD  FLUoxetine (PROZAC) 20 MG capsule Take 60 mg by mouth daily.     Historical Provider, MD  HYDROcodone-acetaminophen (NORCO) 10-325 MG per tablet Take 1 tablet by mouth every 6 (six) hours as needed (pain).     Historical Provider, MD  metoprolol (LOPRESSOR) 50 MG tablet Take 1 tablet (50 mg total) by mouth 2 (two) times daily. Please call and schedule a six month follow up appointment with Dr Irish Lack per last office visit 06/16/15   Jettie Booze, MD  omeprazole (PRILOSEC) 40 MG capsule Take 40 mg by mouth daily.    Historical Provider, MD  ondansetron (ZOFRAN) 4 MG tablet Take 1 tablet (4 mg total) by mouth every 8 (eight) hours as needed for nausea or vomiting. 06/02/15   Mauri Pole, MD  OVER THE COUNTER MEDICATION Take 2 tablets by mouth 2 (two) times daily. OTC eye vitamin without zinc    Historical Provider, MD  OXYGEN Inhale 1 L into the lungs See admin instructions. Use at bedtime and as needed during activity    Historical Provider, MD  predniSONE (DELTASONE) 5 MG tablet Take 7.5 mg by mouth daily with breakfast.  05/04/15   Historical Provider, MD  Probiotic CAPS Take 1 capsule by mouth daily.    Historical Provider, MD  QUEtiapine (SEROQUEL) 200 MG tablet Take 200 mg by mouth at bedtime.     Historical Provider, MD  trimethoprim (TRIMPEX) 100 MG tablet Take 1 tablet (100 mg total) by mouth at bedtime. continuous 01/06/16   Stark Klein, MD  warfarin (COUMADIN) 2.5 MG tablet Take 1 tablet (2.5 mg total) by mouth daily. Take as directed.  Start one per day. 12/27/15   Stark Klein, MD     Family History  Problem Relation Age of Onset   . Lung cancer Sister   . Heart disease Mother   . Stroke Father   . Aneurysm Brother   . Colon cancer Maternal Aunt 71  . Cancer    . Esophageal cancer Neg Hx   . Rectal cancer Neg Hx   . Stomach cancer Neg Hx     Social History   Social History  . Marital status: Widowed    Spouse name: N/A  . Number of children: 2  . Years of education: 12   Occupational History  . Retired  Social History Main Topics  . Smoking status: Former Smoker    Packs/day: 1.00    Years: 5.00    Types: Cigarettes    Quit date: 05/30/1989  . Smokeless tobacco: Never Used  . Alcohol use No  . Drug use: No  . Sexual activity: Not on file   Other Topics Concern  . Not on file   Social History Narrative   Patient is widowed with 2 children   Patient is right handed   Patient has a high school education   Patient drinks 3 cups daily           Vital Signs: Vitals:   05/03/16 0900  BP: (!) 189/102  Pulse: 71  Temp: 98.2 F (36.8 C)     Physical Exam awake, alert. Chest clear to auscultation bilaterally. Heart with regular rate and rhythm. Abdomen soft, positive bowel sounds, left upper quadrant drain intact, some crusting noted around entry site. New StatLock device placed today. Drain bag with approximately 30-40 mL of turbid beige colored fluid.  Imaging: No results found.  Labs:  CBC:  Recent Labs  12/24/15 0328 12/25/15 0028 12/26/15 0527 12/27/15 0152  WBC 13.9* 11.9* 9.7 7.6  HGB 9.4* 9.6* 9.6* 9.2*  HCT 28.7* 29.7* 30.0* 28.6*  PLT 739* 721* 649* 596*    COAGS:  Recent Labs  12/23/15 1813 12/26/15 0527 12/27/15 0152  INR 1.21 1.17 1.51    BMP:  Recent Labs  12/11/15 0354 12/13/15 0832 12/23/15 1813 12/24/15 0328  NA 136 136 135 136  K 3.4* 3.8 4.0 4.0  CL 105 102 103 107  CO2 25 27 22  21*  GLUCOSE 122* 113* 151* 158*  BUN 8 8 5* <5*  CALCIUM 8.5* 8.7* 9.4 8.8*  CREATININE 0.65 0.69 0.82 0.82  GFRNONAA >60 >60 >60 >60  GFRAA >60 >60 >60  >60    LIVER FUNCTION TESTS:  Recent Labs  11/30/15 1558 12/23/15 1813  BILITOT 0.7 0.3  AST 18 25  ALT 11* 23  ALKPHOS 54 92  PROT 7.8 6.7  ALBUMIN 4.1 2.9*    Assessment: Patient is status post distal pancreatectomy on 12/07/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 Status post drainage of a postop left upper quadrant fluid collection on 12/24/15. Drain fluid cultures were negative. Follow-up drain injection on 04/20/16 revealed no communication of abscess with small bowel. Patient continues to have significant output from drain, averaging between 20-40 mL per day. She is stable otherwise and is eating more than in the past. Follow-up drain injection not warranted today based on previous imaging and current output. She is scheduled to follow up with Dr. Barry Dienes this coming Monday. In the meantime, patient was instructed to contact our service when there is no significant output from the drain for 2-3 days. At that point she will be scheduled for follow up CT and possible drain injection prior to possible removal. She was told to contact our service in the interim with any additional questions or concerns.   Signed: D. Rowe Robert 05/03/2016, 10:19 AM   Please refer to Dr. Katrinka Blazing attestation of this note for management and plan.      Patient ID: Morgan Cooper, female   DOB: [birth year], 71 y.o.   MRN: [id number]

## 2016-05-04 DIAGNOSIS — I110 Hypertensive heart disease with heart failure: Secondary | ICD-10-CM | POA: Diagnosis not present

## 2016-05-04 DIAGNOSIS — I5032 Chronic diastolic (congestive) heart failure: Secondary | ICD-10-CM | POA: Diagnosis not present

## 2016-05-04 DIAGNOSIS — J8410 Pulmonary fibrosis, unspecified: Secondary | ICD-10-CM | POA: Diagnosis not present

## 2016-05-04 DIAGNOSIS — K558 Other vascular disorders of intestine: Secondary | ICD-10-CM | POA: Diagnosis not present

## 2016-05-04 DIAGNOSIS — E43 Unspecified severe protein-calorie malnutrition: Secondary | ICD-10-CM | POA: Diagnosis not present

## 2016-05-04 DIAGNOSIS — Z483 Aftercare following surgery for neoplasm: Secondary | ICD-10-CM | POA: Diagnosis not present

## 2016-05-08 DIAGNOSIS — K558 Other vascular disorders of intestine: Secondary | ICD-10-CM | POA: Diagnosis not present

## 2016-05-08 DIAGNOSIS — I110 Hypertensive heart disease with heart failure: Secondary | ICD-10-CM | POA: Diagnosis not present

## 2016-05-08 DIAGNOSIS — J8410 Pulmonary fibrosis, unspecified: Secondary | ICD-10-CM | POA: Diagnosis not present

## 2016-05-08 DIAGNOSIS — K8689 Other specified diseases of pancreas: Secondary | ICD-10-CM | POA: Diagnosis not present

## 2016-05-08 DIAGNOSIS — I5032 Chronic diastolic (congestive) heart failure: Secondary | ICD-10-CM | POA: Diagnosis not present

## 2016-05-08 DIAGNOSIS — E43 Unspecified severe protein-calorie malnutrition: Secondary | ICD-10-CM | POA: Diagnosis not present

## 2016-05-08 DIAGNOSIS — Z483 Aftercare following surgery for neoplasm: Secondary | ICD-10-CM | POA: Diagnosis not present

## 2016-05-15 DIAGNOSIS — I10 Essential (primary) hypertension: Secondary | ICD-10-CM | POA: Diagnosis not present

## 2016-05-15 DIAGNOSIS — N185 Chronic kidney disease, stage 5: Secondary | ICD-10-CM | POA: Diagnosis not present

## 2016-05-15 DIAGNOSIS — E46 Unspecified protein-calorie malnutrition: Secondary | ICD-10-CM | POA: Diagnosis not present

## 2016-05-15 DIAGNOSIS — J449 Chronic obstructive pulmonary disease, unspecified: Secondary | ICD-10-CM | POA: Diagnosis not present

## 2016-05-15 DIAGNOSIS — Z7901 Long term (current) use of anticoagulants: Secondary | ICD-10-CM | POA: Diagnosis not present

## 2016-05-15 DIAGNOSIS — I5032 Chronic diastolic (congestive) heart failure: Secondary | ICD-10-CM | POA: Diagnosis not present

## 2016-05-22 ENCOUNTER — Other Ambulatory Visit: Payer: Self-pay | Admitting: General Surgery

## 2016-05-22 DIAGNOSIS — K8689 Other specified diseases of pancreas: Secondary | ICD-10-CM

## 2016-05-23 ENCOUNTER — Ambulatory Visit
Admission: RE | Admit: 2016-05-23 | Discharge: 2016-05-23 | Disposition: A | Discharge status: Home / Self Care | Payer: Medicare Other | Source: Ambulatory Visit | Attending: General Surgery | Admitting: General Surgery

## 2016-05-23 DIAGNOSIS — K869 Disease of pancreas, unspecified: Secondary | ICD-10-CM | POA: Diagnosis not present

## 2016-05-23 DIAGNOSIS — K8689 Other specified diseases of pancreas: Secondary | ICD-10-CM

## 2016-05-23 DIAGNOSIS — I714 Abdominal aortic aneurysm, without rupture: Secondary | ICD-10-CM | POA: Diagnosis not present

## 2016-05-23 HISTORY — PX: IR RADIOLOGIST EVAL & MGMT: IMG5224

## 2016-05-23 MED ORDER — IOPAMIDOL (ISOVUE-300) INJECTION 61%
100.0000 mL | Freq: Once | INTRAVENOUS | Status: AC | PRN
  Administered 2016-05-23: 100 mL via INTRAVENOUS

## 2016-05-23 NOTE — Progress Notes (Signed)
Patient ID: Morgan Cooper, female   DOB: [birth year], 71 y.o.   MRN: [id number]       Chief Complaint: Patient was seen in consultation today for No chief complaint on file.  at the request of [place]  Referring Physician(s): Byerly,Faera  History of Present Illness: Morgan Cooper is a 71 y.o. female who presents for follow-up of her left upper quadrant drain catheter. 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 The drain output about 5 mL per day. No leaking around the drain catheter.  Past Medical History:  Diagnosis Date  . Anxiety   . Arthritis    hips, legs, hand   . Barrett's esophagus   . Bone pain   . Bruises easily   . Cancer ([place])    skin cancer that has been removed- L hand  . CHF (congestive heart failure) ([place])   . Chills   . CVA (cerebral infarction)    Mini Strokes   . Depression   . Disturbed concentration   . Diverticulitis of large intestine with perforation Hx colectomy  . Esophageal stricture   . Fatigue   . Forgetfulness   . Gastroparesis   . Generalized headaches   . GERD (gastroesophageal reflux disease)   . Heart failure, diastolic, chronic ([place])   . History of hiatal hernia   . Hx of cardiovascular stress test    Lexiscan Myoview (06/2013):  No ischemia, EF 72%; Low Risk  . Hyperlipemia   . Hypertension   . Irritable   . Itch    skin  . Keratosis   . Macular degeneration    left eye  . Nausea    little vomitting  . Osteoporosis   . Pancreatic cyst   . Pancreatic mass   . Panic attacks   .  Persistent headaches   . Pulmonary hypertension   . Rapid heart rate    some times  . Renal failure    transplant - 1983,  followed by Dr. Justin Mend   . Sleep apnea    uses at bedtime- 2liters/min , negative- apnea., last study- 2/017  . Sleep difficulties   . Stroke ([place])   . TIA (transient ischemic attack)     Past Surgical History:  Procedure Laterality Date  . APPENDECTOMY    . cataract surgery bilateral    . COLON RESECTION  2010   Colostomy  . COLOSTOMY  2011   Reversal   . colostomy reversal    . ESOPHAGEAL DILATION    . IR GENERIC HISTORICAL  01/06/2016   IR RADIOLOGIST EVAL & MGMT 01/06/2016 Greggory Keen, MD [facility]  . IR GENERIC HISTORICAL  01/20/2016   IR RADIOLOGIST EVAL & MGMT 01/20/2016 Sandi Mariscal, MD [facility]  . IR GENERIC HISTORICAL  02/03/2016   IR RADIOLOGIST EVAL & MGMT 02/03/2016 Saverio Danker, PA-C [facility]  . IR GENERIC HISTORICAL  02/24/2016   IR RADIOLOGIST EVAL & MGMT 02/24/2016 Corrie Mckusick, DO [facility]  . IR GENERIC HISTORICAL  03/23/2016   IR RADIOLOGIST EVAL & MGMT 03/23/2016 Ardis Rowan, PA-C [facility]  .  IR GENERIC HISTORICAL  04/06/2016   IR RADIOLOGIST EVAL & MGMT 04/06/2016 Darrell K Allred, PA-C [facility]  . IR GENERIC HISTORICAL  04/20/2016   IR RADIOLOGIST EVAL & MGMT 04/20/2016 Greggory Keen, MD [facility]  . KIDNEY TRANSPLANT  1984  . left arm surgery     due to injury, two plates in place   . PANCREATECTOMY N/A 12/07/2015   Procedure: HAND ASSISTED LAPAROSCOPIC DISTAL PANCREATECTOMY;  Surgeon: Stark Klein, MD;  Location: [place];  Service: General;  Laterality: N/A;  . PARATHYROIDECTOMY  11/04/2010   left inferior parathyroid    Allergies: Amoxicillin and Tape  Medications: Prior to Admission medications   Medication Sig Start Date End Date Taking? Authorizing Provider  atorvastatin (LIPITOR) 20 MG tablet Take 20 mg by mouth daily.     Historical Provider, MD  azaTHIOprine  (IMURAN) 50 MG tablet Take 50 mg by mouth daily.     Historical Provider, MD  cycloSPORINE (RESTASIS) 0.05 % ophthalmic emulsion Place 1 drop into both eyes 2 (two) times daily.      Historical Provider, MD  FLUoxetine (PROZAC) 20 MG capsule Take 60 mg by mouth daily.     Historical Provider, MD  HYDROcodone-acetaminophen (NORCO) 10-325 MG per tablet Take 1 tablet by mouth every 6 (six) hours as needed (pain).     Historical Provider, MD  metoprolol (LOPRESSOR) 50 MG tablet Take 1 tablet (50 mg total) by mouth 2 (two) times daily. Please call and schedule a six month follow up appointment with Dr Irish Lack per last office visit 06/16/15   Jettie Booze, MD  omeprazole (PRILOSEC) 40 MG capsule Take 40 mg by mouth daily.    Historical Provider, MD  ondansetron (ZOFRAN) 4 MG tablet Take 1 tablet (4 mg total) by mouth every 8 (eight) hours as needed for nausea or vomiting. 06/02/15   Mauri Pole, MD  OVER THE COUNTER MEDICATION Take 2 tablets by mouth 2 (two) times daily. OTC eye vitamin without zinc    Historical Provider, MD  OXYGEN Inhale 1 L into the lungs See admin instructions. Use at bedtime and as needed during activity    Historical Provider, MD  predniSONE (DELTASONE) 5 MG tablet Take 7.5 mg by mouth daily with breakfast.  05/04/15   Historical Provider, MD  Probiotic CAPS Take 1 capsule by mouth daily.    Historical Provider, MD  QUEtiapine (SEROQUEL) 200 MG tablet Take 200 mg by mouth at bedtime.     Historical Provider, MD  trimethoprim (TRIMPEX) 100 MG tablet Take 1 tablet (100 mg total) by mouth at bedtime. continuous 01/06/16   Stark Klein, MD  warfarin (COUMADIN) 2.5 MG tablet Take 1 tablet (2.5 mg total) by mouth daily. Take as directed.  Start one per day. 12/27/15   Stark Klein, MD     Family History  Problem Relation Age of Onset  . Lung cancer Sister   . Heart disease Mother   . Stroke Father   . Aneurysm Brother   . Colon cancer Maternal Aunt 29  . Cancer    .  Esophageal cancer Neg Hx   . Rectal cancer Neg Hx   . Stomach cancer Neg Hx     Social History   Social History  . Marital status: Widowed    Spouse name: N/A  . Number of children: 2  . Years of education: 12   Occupational History  . Retired    Social History Main Topics  .  Smoking status: Former Smoker    Packs/day: 1.00    Years: 5.00    Types: Cigarettes    Quit date: 05/30/1989  . Smokeless tobacco: Never Used  . Alcohol use No  . Drug use: No  . Sexual activity: Not on file   Other Topics Concern  . Not on file   Social History Narrative   Patient is widowed with 2 children   Patient is right handed   Patient has a high school education   Patient drinks 3 cups daily          ECOG Status: 0 - Asymptomatic  Review of Systems: A 12 point ROS discussed and pertinent positives are indicated in the HPI above.  All other systems are negative.  Review of Systems  Vital Signs: BP (!) 163/72   Pulse (!) 47   Temp 98.1 F (36.7 C)   SpO2 94%   Physical Exam  Mallampati Score:     Imaging: Ct Abdomen Pelvis W Contrast  Result Date: 05/23/2016 CLINICAL DATA:  history of distal pancreatectomy on 12/07/15 for a microcystic serous cystadenoma.Postop she developed a left upper quadrant fluid collection with subsequent drain placement on 12/24/15. Drain fluid cultures were negative. Drain injection on 03/23/16 revealed persistent irregular collections in the left upper abdomen surrounding the drainage catheter. Drain injection on 04/06/2016 revealed revealed communication with small bowel. 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 She presents again today for follow-up . EXAM: CT ABDOMEN AND PELVIS WITH CONTRAST TECHNIQUE: Multidetector CT imaging of the abdomen and pelvis was performed using the standard protocol following bolus administration of intravenous contrast. CONTRAST:   14mL ISOVUE-300 IOPAMIDOL (ISOVUE-300) INJECTION 61% COMPARISON:  04/07/2016 and previous FINDINGS: Lower chest: Coronary calcifications. Mild four-chamber cardiac enlargement. Coarse chronic emphysematous parenchymal changes in the visualized lung bases. No pleural or pericardial effusion. Hepatobiliary: Heterogeneous parenchymal enhancement without focal lesion or biliary ductal dilatation. Gallbladder nondilated. Pancreas: Coarse calcification in the region of the pancreatic tail. Postop changes of partial pancreatectomy. The body and head are unremarkable. Spleen: Normal in size without focal abnormality. Adrenals/Urinary Tract: Normal adrenal glands. Marked bilateral native renal parenchymal atrophy. 11 mm left upper renal cyst. Unremarkable transplant kidney in the left iliac fossa. No hydronephrosis. Stomach/Bowel: Stomach decompressed. Fluid distended mid abdominal small bowel loops, decompressed distally,. Appendix surgically absent. Focal fecal distention of the mid sigmoid colon in the region of anastomotic staple line, as seen previously. Remainder of colon is nondilated and unremarkable. Vascular/Lymphatic: Aortoiliac arterial calcifications. 4.3 cm infrarenal saccular abdominal aortic aneurysm, previously 4.2 cm by my measurement. Reproductive: Uterus and bilateral adnexa are unremarkable. Other: Stable pigtail drain catheter in the left upper abdomen. No residual adjacent fluid collection. The regional inflammation seen previously is improved. Trace pelvic ascites. No free air. Musculoskeletal: Narrowing of the L5-S1 interspace with grade 1/2 anterolisthesis. No pars defect. Advanced left hip osteoarthritis. Negative for fracture or other worrisome bone lesion. IMPRESSION: 1. Stable left upper quadrant drain catheter, with interval resolution of left upper quadrant collection and improving regional edema/inflammation. 2. 4.3 cm infrarenal abdominal aortic aneurysm. Recommend followup by ultrasound  in 1 year. This recommendation follows ACR consensus guidelines: White Paper of the ACR Incidental Findings Committee II on Vascular Findings. J Am Coll Radiol 2013; 10:789-794. 3. Coronary calcifications 4. Lumbar and left hip degenerative change. Electronically Signed   By: Lucrezia Europe M.D.   On: 05/23/2016 09:57   LEFT  UPPER QUADRANT ABSCESS DRAIN CATHETER INJECTION UNDER FLUOROSCOPY  TECHNIQUE: The procedure, risks (including but not limited to bleeding, infection, organ damage ), benefits, and alternatives were explained to the patient. Questions regarding the procedure were encouraged and answered. The patient understands and consents to the procedure.  Survey fluoroscopic inspection reveals stable position of the left upper quadrant pigtail drain catheter.  Injection demonstrates no residual abscess cavity. All the injected contrast leaks back along the catheter tube and exits at the skin entry site of the catheter. No fistula.  IMPRESSION: 1. Resolution of abscess cavity. 2. No evidence of fistula to bowel nor to pancreatic duct.   Electronically Signed   By: Lucrezia Europe M.D.   On: 05/23/2016 10:19 Labs:  CBC:  Recent Labs  12/24/15 0328 12/25/15 0028 12/26/15 0527 12/27/15 0152  WBC 13.9* 11.9* 9.7 7.6  HGB 9.4* 9.6* 9.6* 9.2*  HCT 28.7* 29.7* 30.0* 28.6*  PLT 739* 721* 649* 596*    COAGS:  Recent Labs  12/23/15 1813 12/26/15 0527 12/27/15 0152  INR 1.21 1.17 1.51    BMP:  Recent Labs  12/11/15 0354 12/13/15 0832 12/23/15 1813 12/24/15 0328  NA 136 136 135 136  K 3.4* 3.8 4.0 4.0  CL 105 102 103 107  CO2 25 27 22  21*  GLUCOSE 122* 113* 151* 158*  BUN 8 8 5* <5*  CALCIUM 8.5* 8.7* 9.4 8.8*  CREATININE 0.65 0.69 0.82 0.82  GFRNONAA >60 >60 >60 >60  GFRAA >60 >60 >60 >60    LIVER FUNCTION TESTS:  Recent Labs  11/30/15 1558 12/23/15 1813  BILITOT 0.7 0.3  AST 18 25  ALT 11* 23  ALKPHOS 54 92  PROT 7.8 6.7  ALBUMIN 4.1 2.9*     TUMOR MARKERS: No results for input(s): AFPTM, CEA, CA199, CHROMGRNA in the last 8760 hours.  Assessment and Plan:  My impression is that the patient has made significant progress since previous studies. The CT shows no residual abscess cavity and decrease in regional inflammation. Drain injection shows no residual abscess cavity or fistula. Her outputs are very low. It is time to start thinking about drain removal.  I reviewed the findings with Dr. Barry Dienes over the telephone. The patient is scheduled to see her in 2 weeks and will be reassessed at that point.  For now, we'll keep her drain tube gravity bag with no flushings. I reviewed the findings and plan with the patient.  Thank you for this interesting consult.  I greatly enjoyed meeting STEPAHNIE CAMPO and look forward to participating in their care.  A copy of this report was sent to the requesting provider on this date.  Electronically Signed: Verlie Liotta III, DAYNE Ronelle Smallman 05/23/2016, 10:20 AM   I spent a total of    25 Minutes in face to face in clinical consultation, greater than 50% of which was counseling/coordinating care for left upper abdominal abscess drain catheter.

## 2016-05-24 DIAGNOSIS — I5032 Chronic diastolic (congestive) heart failure: Secondary | ICD-10-CM | POA: Diagnosis not present

## 2016-05-24 DIAGNOSIS — Z483 Aftercare following surgery for neoplasm: Secondary | ICD-10-CM | POA: Diagnosis not present

## 2016-05-24 DIAGNOSIS — J8410 Pulmonary fibrosis, unspecified: Secondary | ICD-10-CM | POA: Diagnosis not present

## 2016-05-24 DIAGNOSIS — K558 Other vascular disorders of intestine: Secondary | ICD-10-CM | POA: Diagnosis not present

## 2016-05-24 DIAGNOSIS — E43 Unspecified severe protein-calorie malnutrition: Secondary | ICD-10-CM | POA: Diagnosis not present

## 2016-05-24 DIAGNOSIS — I110 Hypertensive heart disease with heart failure: Secondary | ICD-10-CM | POA: Diagnosis not present

## 2016-05-25 ENCOUNTER — Other Ambulatory Visit: Payer: Medicare Other

## 2016-06-12 DIAGNOSIS — N2581 Secondary hyperparathyroidism of renal origin: Secondary | ICD-10-CM | POA: Diagnosis not present

## 2016-06-12 DIAGNOSIS — N185 Chronic kidney disease, stage 5: Secondary | ICD-10-CM | POA: Diagnosis not present

## 2016-06-12 DIAGNOSIS — I10 Essential (primary) hypertension: Secondary | ICD-10-CM | POA: Diagnosis not present

## 2016-06-12 DIAGNOSIS — Z1389 Encounter for screening for other disorder: Secondary | ICD-10-CM | POA: Diagnosis not present

## 2016-06-12 DIAGNOSIS — Z940 Kidney transplant status: Secondary | ICD-10-CM | POA: Diagnosis not present

## 2016-06-12 DIAGNOSIS — D638 Anemia in other chronic diseases classified elsewhere: Secondary | ICD-10-CM | POA: Diagnosis not present

## 2016-06-12 DIAGNOSIS — Z0000 Encounter for general adult medical examination without abnormal findings: Secondary | ICD-10-CM | POA: Diagnosis not present

## 2016-06-12 DIAGNOSIS — F329 Major depressive disorder, single episode, unspecified: Secondary | ICD-10-CM | POA: Diagnosis not present

## 2016-06-12 DIAGNOSIS — J449 Chronic obstructive pulmonary disease, unspecified: Secondary | ICD-10-CM | POA: Diagnosis not present

## 2016-06-12 DIAGNOSIS — E46 Unspecified protein-calorie malnutrition: Secondary | ICD-10-CM | POA: Diagnosis not present

## 2016-06-12 DIAGNOSIS — I5032 Chronic diastolic (congestive) heart failure: Secondary | ICD-10-CM | POA: Diagnosis not present

## 2016-06-12 DIAGNOSIS — Z7901 Long term (current) use of anticoagulants: Secondary | ICD-10-CM | POA: Diagnosis not present

## 2016-06-12 DIAGNOSIS — R636 Underweight: Secondary | ICD-10-CM | POA: Diagnosis not present

## 2016-06-14 DIAGNOSIS — N2581 Secondary hyperparathyroidism of renal origin: Secondary | ICD-10-CM | POA: Diagnosis not present

## 2016-06-14 DIAGNOSIS — D631 Anemia in chronic kidney disease: Secondary | ICD-10-CM | POA: Diagnosis not present

## 2016-06-14 DIAGNOSIS — I10 Essential (primary) hypertension: Secondary | ICD-10-CM | POA: Diagnosis not present

## 2016-06-14 DIAGNOSIS — E785 Hyperlipidemia, unspecified: Secondary | ICD-10-CM | POA: Diagnosis not present

## 2016-06-14 DIAGNOSIS — N185 Chronic kidney disease, stage 5: Secondary | ICD-10-CM | POA: Diagnosis not present

## 2016-06-20 DIAGNOSIS — D136 Benign neoplasm of pancreas: Secondary | ICD-10-CM | POA: Diagnosis not present

## 2016-06-21 DIAGNOSIS — F330 Major depressive disorder, recurrent, mild: Secondary | ICD-10-CM | POA: Diagnosis not present

## 2016-06-30 DIAGNOSIS — F329 Major depressive disorder, single episode, unspecified: Secondary | ICD-10-CM | POA: Diagnosis not present

## 2016-06-30 DIAGNOSIS — I10 Essential (primary) hypertension: Secondary | ICD-10-CM | POA: Diagnosis not present

## 2016-06-30 DIAGNOSIS — R51 Headache: Secondary | ICD-10-CM | POA: Diagnosis not present

## 2016-07-06 DIAGNOSIS — Z79891 Long term (current) use of opiate analgesic: Secondary | ICD-10-CM | POA: Diagnosis not present

## 2016-07-06 DIAGNOSIS — G894 Chronic pain syndrome: Secondary | ICD-10-CM | POA: Diagnosis not present

## 2016-07-06 DIAGNOSIS — M5442 Lumbago with sciatica, left side: Secondary | ICD-10-CM | POA: Diagnosis not present

## 2016-07-06 DIAGNOSIS — G8929 Other chronic pain: Secondary | ICD-10-CM | POA: Diagnosis not present

## 2016-07-11 ENCOUNTER — Encounter: Payer: Self-pay | Admitting: Interventional Cardiology

## 2016-07-11 ENCOUNTER — Ambulatory Visit (INDEPENDENT_AMBULATORY_CARE_PROVIDER_SITE_OTHER): Payer: Medicare Other | Admitting: Interventional Cardiology

## 2016-07-11 VITALS — BP 170/100 | HR 50 | Ht 65.0 in | Wt 104.6 lb

## 2016-07-11 DIAGNOSIS — I10 Essential (primary) hypertension: Secondary | ICD-10-CM

## 2016-07-11 DIAGNOSIS — Z940 Kidney transplant status: Secondary | ICD-10-CM

## 2016-07-11 DIAGNOSIS — R51 Headache: Secondary | ICD-10-CM | POA: Diagnosis not present

## 2016-07-11 DIAGNOSIS — R519 Headache, unspecified: Secondary | ICD-10-CM

## 2016-07-11 MED ORDER — AMLODIPINE BESYLATE 5 MG PO TABS: 5.0000 mg | ORAL_TABLET | Freq: Every day | ORAL | 3 refills | Status: DC

## 2016-07-11 NOTE — Patient Instructions (Signed)
Medication Instructions:  START amlodipine 5mg  daily  Labwork: None ordered  Testing/Procedures: None ordered  Follow-Up: Your physician recommends that you schedule a follow-up appointment in: 2 weeks in the Hypertension Clinic.   Any Other Special Instructions Will Be Listed Below (If Applicable).     If you need a refill on your cardiac medications before your next appointment, please call your pharmacy.

## 2016-07-11 NOTE — Progress Notes (Signed)
Cardiology Office Note   Date:  07/11/2016   ID:  [name], DOB September 11, 1945, MRN [id number]  PCP:  Lottie Dawson, MD    No chief complaint on file. f/u chronic diastolic heart failure    Wt Readings from Last 3 Encounters:  07/11/16 104 lb 9.6 oz (47.4 kg)  02/11/16 107 lb 12.8 oz (48.9 kg)  02/08/16 107 lb (48.5 kg)       History of Present Illness: Morgan Cooper is a 71 y.o. female   has a history of diastolic HF, HTN, HLD, prior remote kidney transplant, past TIA/strokes and past atrial tach noted on an event monitor in 2015. Other issues as noted below. Negative stress test in 2015. Remote echo with grade 2 diastolic dysfunction from 2013.   In 2017, she had part of her pancreas removed due to a cyst.  She lost 20 lbs with that surgery.   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  She is now off of Coumadin.  She feels headaches frequently.  She has had high BP readings.  Her BP fluctuates between 027- 741 systolic.  She is referred back here for BP management.  Metoprolol was increased but BP remains high.    Past Medical History:  Diagnosis Date  . Anxiety   . Arthritis    hips, legs, hand   . Barrett's esophagus   . Bone pain   . Bruises easily   . Cancer ([place])    skin cancer that has been removed- L hand  . CHF (congestive heart failure) ([place])   . Chills   . CVA (cerebral infarction)    Mini Strokes   . Depression   . Disturbed concentration   . Diverticulitis of large intestine with perforation Hx colectomy  . Esophageal stricture   . Fatigue   . Forgetfulness   . Gastroparesis   . Generalized headaches   . GERD (gastroesophageal reflux disease)   . Heart failure, diastolic, chronic ([place])   . History of hiatal hernia   . Hx of cardiovascular stress test    Lexiscan Myoview (06/2013):  No ischemia, EF 72%; Low Risk  . Hyperlipemia   . Hypertension   . Irritable   . Itch    skin  . Keratosis   .  Macular degeneration    left eye  . Nausea    little vomitting  . Osteoporosis   . Pancreatic cyst   . Pancreatic mass   . Panic attacks   . Persistent headaches   . Pulmonary hypertension ([place])   . Rapid heart rate    some times  . Renal failure    transplant - 1983,  followed by Dr. Justin Mend   . Sleep apnea    uses at bedtime- 2liters/min , negative- apnea., last study- 2/017  . Sleep difficulties   . Stroke ([place])   . TIA (transient ischemic attack)     Past Surgical History:  Procedure Laterality Date  . APPENDECTOMY    . cataract surgery bilateral    . COLON RESECTION  2010   Colostomy  . COLOSTOMY  2011   Reversal   . colostomy reversal    . ESOPHAGEAL DILATION    . IR GENERIC HISTORICAL  01/06/2016   IR RADIOLOGIST EVAL & MGMT 01/06/2016 Greggory Keen, MD [facility]  . IR GENERIC HISTORICAL  01/20/2016   IR RADIOLOGIST EVAL & MGMT 01/20/2016 Sandi Mariscal, MD [facility]  .  IR GENERIC HISTORICAL  02/03/2016   IR RADIOLOGIST EVAL & MGMT 02/03/2016 Saverio Danker, PA-C [facility]  . IR GENERIC HISTORICAL  02/24/2016   IR RADIOLOGIST EVAL & MGMT 02/24/2016 Corrie Mckusick, DO [facility]  . IR GENERIC HISTORICAL  03/23/2016   IR RADIOLOGIST EVAL & MGMT 03/23/2016 Ardis Rowan, PA-C [facility]  . IR GENERIC HISTORICAL  04/06/2016   IR RADIOLOGIST EVAL & MGMT 04/06/2016 Darrell K Allred, PA-C [facility]  . IR GENERIC HISTORICAL  04/20/2016   IR RADIOLOGIST EVAL & MGMT 04/20/2016 Greggory Keen, MD [facility]  . KIDNEY TRANSPLANT  1984  . left arm surgery     due to injury, two plates in place   . PANCREATECTOMY N/A 12/07/2015   Procedure: HAND ASSISTED LAPAROSCOPIC DISTAL PANCREATECTOMY;  Surgeon: Stark Klein, MD;  Location: [place];  Service: General;  Laterality: N/A;  . PARATHYROIDECTOMY  11/04/2010   left inferior parathyroid     Current Outpatient Prescriptions  Medication Sig Dispense Refill  . atorvastatin (LIPITOR) 20 MG  tablet Take 20 mg by mouth daily.     Marland Kitchen azaTHIOprine (IMURAN) 50 MG tablet Take 50 mg by mouth daily.     . cycloSPORINE (RESTASIS) 0.05 % ophthalmic emulsion Place 1 drop into both eyes 2 (two) times daily.      Marland Kitchen FLUoxetine (PROZAC) 20 MG capsule Take 60 mg by mouth daily.     Marland Kitchen HYDROcodone-acetaminophen (NORCO) 10-325 MG per tablet Take 1 tablet by mouth every 6 (six) hours as needed (pain).     . metoprolol (LOPRESSOR) 50 MG tablet Take 1 tablet (50 mg total) by mouth 2 (two) times daily. Please call and schedule a six month follow up appointment with Dr Irish Lack per last office visit 60 tablet 6  . omeprazole (PRILOSEC) 40 MG capsule Take 40 mg by mouth daily.    . ondansetron (ZOFRAN) 4 MG tablet Take 1 tablet (4 mg total) by mouth every 8 (eight) hours as needed for nausea or vomiting. 30 tablet 1  . OVER THE COUNTER MEDICATION Take 2 tablets by mouth 2 (two) times daily. OTC eye vitamin without zinc    . OXYGEN Inhale 1 L into the lungs See admin instructions. Use at bedtime and as needed during activity    . predniSONE (DELTASONE) 5 MG tablet Take 7.5 mg by mouth daily with breakfast.     . Probiotic CAPS Take 1 capsule by mouth daily.    . QUEtiapine (SEROQUEL) 200 MG tablet Take 200 mg by mouth at bedtime.     Marland Kitchen trimethoprim (TRIMPEX) 100 MG tablet Take 1 tablet (100 mg total) by mouth at bedtime. continuous     No current facility-administered medications for this visit.     Allergies:   Amoxicillin and Tape    Social History:  The patient  reports that she quit smoking about 27 years ago. Her smoking use included Cigarettes. She has a 5.00 pack-year smoking history. She has never used smokeless tobacco. She reports that she does not drink alcohol or use drugs.   Family History:  The patient's family history includes Aneurysm in her brother; Colon cancer (age of onset: 49) in her maternal aunt; Heart disease in her mother; Lung cancer in her sister; Stroke in her father.    ROS:   Please see the history of present illness.   Otherwise, review of systems are positive for headache .   All other systems are reviewed  and negative.    PHYSICAL EXAM: VS:  BP (!) 170/100 (BP Location: Left Arm, Patient Position: Sitting, Cuff Size: Normal)   Pulse (!) 50   Ht 5\' 5"  (1.651 m)   Wt 104 lb 9.6 oz (47.4 kg)   SpO2 93%   BMI 17.41 kg/m  , BMI Body mass index is 17.41 kg/m. GEN: Well nourished, well developed, in no acute distress  HEENT: normal  Neck: no JVD, carotid bruits, or masses Cardiac: RRR; no murmurs, rubs, or gallops,no edema  Respiratory:  clear to auscultation bilaterally, normal work of breathing GI: soft, nontender, nondistended,  MS: no deformity or atrophy  Skin: warm and dry, no rash Neuro:  Strength and sensation are intact Psych: euthymic mood, flat affect    EKG:   The ekg ordered today demonstrates NSR, nonspecific ST segment changes   Recent Labs: 12/23/2015: ALT 23; Magnesium 2.1 12/24/2015: BUN <5; Creatinine, Ser 0.82; Potassium 4.0; Sodium 136 12/27/2015: Hemoglobin 9.2; Platelets 596   Lipid Panel    Component Value Date/Time   CHOL 157 01/19/2015 1154   TRIG 131 01/19/2015 1154   HDL 47 01/19/2015 1154   CHOLHDL 3.3 01/19/2015 1154   VLDL 26 01/19/2015 1154   LDLCALC 84 01/19/2015 1154     Other studies Reviewed: Additional studies/ records that were reviewed today with results demonstrating: labs reviewed as noted beklow from PMD.   ASSESSMENT AND PLAN:  1. Chronic diastolic heart failure:  She appears euvolemic and off of Lasix. Currently no fluid retention.  I would have her take as needed Lasix.  2. HTN: BP readigs high- may be causing her headache.  Start Amlodipine 5 mg daily.  COuld change metoprolol to carvedilol if BP stays high.  Checked with pharmD.  Amlodipine OK with her immunosuppression.  Avoiding ACE-I given renal transplant. 3. Now of anticoagulation.   4. Hyperlipidemia: Controlled on atorvastatin. She will  need fasting lipid profile done in August 2018.  Liver function tests were done in 3/18 and were normal.    Current medicines are reviewed at length with the patient today.  The patient concerns regarding her medicines were addressed.  The following changes have been made:  No change  Labs/ tests ordered today include:  No orders of the defined types were placed in this encounter.   Recommend 150 minutes/week of aerobic exercise Low fat, low carb, high fiber diet recommended  Disposition:   FU in 2 weeks with PharmD HTN clinic   Signed, Larae Grooms, MD  07/11/2016 10:53 AM    [facility] [address], [place], [place]  [address] Phone: [phone]; Fax: [phone]

## 2016-07-14 DIAGNOSIS — I5032 Chronic diastolic (congestive) heart failure: Secondary | ICD-10-CM | POA: Diagnosis not present

## 2016-07-14 DIAGNOSIS — J449 Chronic obstructive pulmonary disease, unspecified: Secondary | ICD-10-CM | POA: Diagnosis not present

## 2016-07-14 DIAGNOSIS — G518 Other disorders of facial nerve: Secondary | ICD-10-CM | POA: Diagnosis not present

## 2016-07-14 DIAGNOSIS — F329 Major depressive disorder, single episode, unspecified: Secondary | ICD-10-CM | POA: Diagnosis not present

## 2016-07-14 DIAGNOSIS — M810 Age-related osteoporosis without current pathological fracture: Secondary | ICD-10-CM | POA: Diagnosis not present

## 2016-07-14 DIAGNOSIS — K861 Other chronic pancreatitis: Secondary | ICD-10-CM | POA: Diagnosis not present

## 2016-07-25 ENCOUNTER — Ambulatory Visit (INDEPENDENT_AMBULATORY_CARE_PROVIDER_SITE_OTHER): Payer: Medicare Other | Admitting: Pharmacist

## 2016-07-25 VITALS — BP 118/78 | HR 59

## 2016-07-25 DIAGNOSIS — I10 Essential (primary) hypertension: Secondary | ICD-10-CM

## 2016-07-25 NOTE — Progress Notes (Signed)
Patient ID: Morgan Cooper                 DOB: 1946/02/07                      MRN: [id number]     HPI: Morgan Cooper is a 71 y.o. female referred by Dr. Irish Lack to HTN clinic. PMH includes diastolic HF, hyperlipidemia, hypertension, past TIA/stroke and remote kidney transplant. Amlodipine 5mg  daily started on 07/11/16 due to elevated HTN reading in office and recent history of headaches. Will avoid ACEi/ARB due to history of renal transplant per MD's request. Patient presents for hypertension evaluation.  Patient reports a fall last week non-related to dizziness. Reports fatigue, some dizziness, SOB, and pain on her shoulder but none as new symptoms.  Current HTN meds:  Amlodipine 5mg  daily Metoprolol 75mg  twice daily  Previously tried:  Furosemide 20mg  daily Hydralazine 25mg   BP goal: 140/80  Family History: The patient's family history includes Aneurysm in her brother;  Heart disease in her mother; Lung cancer in her sister; Stroke in her father.   Social History: former smoker (quit ~27 years ago); never used smokeless tobacco. She reports that she does not drink alcohol or use drugs.   Diet: regula coffee (2 cups per day) ; mostly home cooked meals, avoid adding extra salt   Exercise: uses a walker every time she goes out. Not much walking.  Home BP readings: no records available; per patient- recollection 155/77 this morning (before taking medication)  Wt Readings from Last 3 Encounters:  07/11/16 104 lb 9.6 oz (47.4 kg)  02/11/16 107 lb 12.8 oz (48.9 kg)  02/08/16 107 lb (48.5 kg)   BP Readings from Last 3 Encounters:  07/25/16 118/78  07/11/16 (!) 170/100  05/23/16 (!) 163/72   Pulse Readings from Last 3 Encounters:  07/25/16 (!) 59  07/11/16 (!) 50  05/23/16 (!) 47     Past Medical History:  Diagnosis Date  . Anxiety   . Arthritis    hips, legs, hand   . Barrett's esophagus   . Bone pain   . Bruises easily   . Cancer ([place])    skin cancer that has been  removed- L hand  . CHF (congestive heart failure) ([place])   . Chills   . CVA (cerebral infarction)    Mini Strokes   . Depression   . Disturbed concentration   . Diverticulitis of large intestine with perforation Hx colectomy  . Esophageal stricture   . Fatigue   . Forgetfulness   . Gastroparesis   . Generalized headaches   . GERD (gastroesophageal reflux disease)   . Heart failure, diastolic, chronic ([place])   . History of hiatal hernia   . Hx of cardiovascular stress test    Lexiscan Myoview (06/2013):  No ischemia, EF 72%; Low Risk  . Hyperlipemia   . Hypertension   . Irritable   . Itch    skin  . Keratosis   . Macular degeneration    left eye  . Nausea    little vomitting  . Osteoporosis   . Pancreatic cyst   . Pancreatic mass   . Panic attacks   . Persistent headaches   . Pulmonary hypertension ([place])   . Rapid heart rate    some times  . Renal failure    transplant - 1983,  followed by Dr. Justin Mend   . Sleep apnea    uses at bedtime- 2liters/min ,  negative- apnea., last study- 2/017  . Sleep difficulties   . Stroke ([place])   . TIA (transient ischemic attack)     Current Outpatient Prescriptions on File Prior to Visit  Medication Sig Dispense Refill  . amLODipine (NORVASC) 5 MG tablet Take 1 tablet (5 mg total) by mouth daily. 90 tablet 3  . atorvastatin (LIPITOR) 20 MG tablet Take 20 mg by mouth daily.     Marland Kitchen azaTHIOprine (IMURAN) 50 MG tablet Take 50 mg by mouth daily.     . cycloSPORINE (RESTASIS) 0.05 % ophthalmic emulsion Place 1 drop into both eyes 2 (two) times daily.      Marland Kitchen FLUoxetine (PROZAC) 20 MG capsule Take 60 mg by mouth daily.     Marland Kitchen HYDROcodone-acetaminophen (NORCO) 10-325 MG per tablet Take 1 tablet by mouth every 6 (six) hours as needed (pain).     . metoprolol (LOPRESSOR) 50 MG tablet Take 1 tablet (50 mg total) by mouth 2 (two) times daily. Please call and schedule a six month follow up appointment with Dr Irish Lack per last office visit 60 tablet 6  .  omeprazole (PRILOSEC) 40 MG capsule Take 40 mg by mouth daily.    . ondansetron (ZOFRAN) 4 MG tablet Take 1 tablet (4 mg total) by mouth every 8 (eight) hours as needed for nausea or vomiting. 30 tablet 1  . OVER THE COUNTER MEDICATION Take 2 tablets by mouth 2 (two) times daily. OTC eye vitamin without zinc    . OXYGEN Inhale 1 L into the lungs See admin instructions. Use at bedtime and as needed during activity    . predniSONE (DELTASONE) 5 MG tablet Take 7.5 mg by mouth daily with breakfast.     . Probiotic CAPS Take 1 capsule by mouth daily.    . QUEtiapine (SEROQUEL) 200 MG tablet Take 200 mg by mouth at bedtime.     Marland Kitchen trimethoprim (TRIMPEX) 100 MG tablet Take 1 tablet (100 mg total) by mouth at bedtime. continuous     No current facility-administered medications on file prior to visit.     Allergies  Allergen Reactions  . Amoxicillin Diarrhea    Has patient had a PCN reaction causing immediate rash, facial/tongue/throat swelling, SOB or lightheadedness with hypotension: No Has patient had a PCN reaction causing severe rash involving mucus membranes or skin necrosis: No Has patient had a PCN reaction that required hospitalization No Has patient had a PCN reaction occurring within the last 10 years: Yes If all of the above answers are "NO", then may proceed with Cephalosporin use.   . Tape Rash    Blood pressure 118/78, pulse (!) 59, SpO2 96 %.  Essential hypertension: Blood pressure at goal with no reported ADR to therapy. Will keep close monitoring due to history of fatigue and dizziness.  Noted pulse at 59 bpm but reported as normal by patient.  Will continue current therapy without changes and follow up in 3 weeks. Patient instructed to take blood pressure twice daily and keep records to bring to next appointment.  Plan to decrease metoprolol back to 50mg  twice daily if BP remains at goal but dizziness continues or worsen.   Donnivan Villena Rodriguez-Guzman PharmD, [place] [address] 07/25/2016 3:12 PM

## 2016-07-25 NOTE — Patient Instructions (Addendum)
Return for a  follow up appointment in 3 weeks  Your blood pressure today is 118/78 pulse 59  Check your blood pressure at home daily (if able) and keep record of the readings.  Take your BP meds as follows: **ALL medication as prescribed**  Bring all of your meds, your BP cuff and your record of home blood pressures to your next appointment.  Exercise as you're able, try to walk approximately 30 minutes per day.  Keep salt intake to a minimum, especially watch canned and prepared boxed foods.  Eat more fresh fruits and vegetables and fewer canned items.  Avoid eating in fast food restaurants.    HOW TO TAKE YOUR BLOOD PRESSURE: . Rest 5 minutes before taking your blood pressure. .  Don't smoke or drink caffeinated beverages for at least 30 minutes before. . Take your blood pressure before (not after) you eat. . Sit comfortably with your back supported and both feet on the floor (don't cross your legs). . Elevate your arm to heart level on a table or a desk. . Use the proper sized cuff. It should fit smoothly and snugly around your bare upper arm. There should be enough room to slip a fingertip under the cuff. The bottom edge of the cuff should be 1 inch above the crease of the elbow. . Ideally, take 3 measurements at one sitting and record the average.

## 2016-08-04 ENCOUNTER — Encounter: Payer: Self-pay | Admitting: Interventional Radiology

## 2016-08-07 ENCOUNTER — Encounter: Payer: Self-pay | Admitting: Radiology

## 2016-08-15 ENCOUNTER — Ambulatory Visit (INDEPENDENT_AMBULATORY_CARE_PROVIDER_SITE_OTHER): Payer: Medicare Other | Admitting: Pharmacist

## 2016-08-15 VITALS — BP 124/84 | HR 47

## 2016-08-15 DIAGNOSIS — I10 Essential (primary) hypertension: Secondary | ICD-10-CM | POA: Diagnosis not present

## 2016-08-15 NOTE — Patient Instructions (Signed)
Return for a follow up appointment as recommended with Dr. Irish Lack in Dec 2018.  Check your blood pressure at home daily (if able) and keep record of the readings.  Take your BP meds as follows: Continue all medications as prescribed  Bring all of your meds, your BP cuff and your record of home blood pressures to your next appointment.  Exercise as you're able, try to walk approximately 30 minutes per day.  Keep salt intake to a minimum, especially watch canned and prepared boxed foods.  Eat more fresh fruits and vegetables and fewer canned items.  Avoid eating in fast food restaurants.    HOW TO TAKE YOUR BLOOD PRESSURE: . Rest 5 minutes before taking your blood pressure. .  Don't smoke or drink caffeinated beverages for at least 30 minutes before. . Take your blood pressure before (not after) you eat. . Sit comfortably with your back supported and both feet on the floor (don't cross your legs). . Elevate your arm to heart level on a table or a desk. . Use the proper sized cuff. It should fit smoothly and snugly around your bare upper arm. There should be enough room to slip a fingertip under the cuff. The bottom edge of the cuff should be 1 inch above the crease of the elbow. . Ideally, take 3 measurements at one sitting and record the average.

## 2016-08-15 NOTE — Progress Notes (Signed)
Patient ID: Morgan Cooper                 DOB: 05-13-1945                      MRN: [id number]     HPI: Morgan Cooper is a 71 y.o. female patient of Dr. Irish Lack who presents today for hypertension follow up.  PMH includes diastolic HF, hyperlipidemia, hypertension, past TIA/stroke and remote kidney transplant. Amlodipine 5mg  daily started on 07/11/16 due to elevated HTN reading in office and recent history of headaches. Will avoid ACEi/ARB due to history of renal transplant per MD's request. At her most recent visit her pressure was at goal and no medication changes were made.   She presents today for follow up. She states she had an episode of feeling weak and tired where her heart rate was 157. She states this episode lasted longer than her ususal episodes (about 1 hour) this happened about 10 days ago. She has not had any repeats of this since then and feels well today.   Her pressures tend to fluctuate ranging from 98-165/63-93 (most 130s/70s). Her HR usually runs in the 50-60s, but there are several on her record that are in the 100s.   She states she usually can't tell when her HR is high except that episode of 157. She also does not notice when pressure is high, but feels lethargic when it is low (<086 systolic).    Current HTN meds:  Amlodipine 5mg  daily  Metoprolol 75mg  BID  Previously tried:  Furosemide 20mg  daily Hydralazine 25mg   BP goal: <140/90  Family History: The patient's family history includes Aneurysm in her brother;  Heart disease in her mother; Lung cancer in her sister; Stroke in her father.  Social History: former smoker (quit ~27 years ago); never used smokeless tobacco. She reports that she does not drink alcohol or use drugs.  Diet: regula coffee (2 cups per day) ; mostly home cooked meals, avoid adding extra salt   Exercise: uses a walker every time she goes out. Not much walking.  Home BP readings: see above.   Wt Readings from Last 3 Encounters:    07/11/16 104 lb 9.6 oz (47.4 kg)  02/11/16 107 lb 12.8 oz (48.9 kg)  02/08/16 107 lb (48.5 kg)   BP Readings from Last 3 Encounters:  08/15/16 124/84  07/25/16 118/78  07/11/16 (!) 170/100   Pulse Readings from Last 3 Encounters:  08/15/16 (!) 47  07/25/16 (!) 59  07/11/16 (!) 50    Renal function: CrCl cannot be calculated (Patient's most recent lab result is older than the maximum 21 days allowed.).  Past Medical History:  Diagnosis Date  . Anxiety   . Arthritis    hips, legs, hand   . Barrett's esophagus   . Bone pain   . Bruises easily   . Cancer ([place])    skin cancer that has been removed- L hand  . CHF (congestive heart failure) ([place])   . Chills   . CVA (cerebral infarction)    Mini Strokes   . Depression   . Disturbed concentration   . Diverticulitis of large intestine with perforation Hx colectomy  . Esophageal stricture   . Fatigue   . Forgetfulness   . Gastroparesis   . Generalized headaches   . GERD (gastroesophageal reflux disease)   . Heart failure, diastolic, chronic ([place])   . History of hiatal hernia   .  Hx of cardiovascular stress test    Lexiscan Myoview (06/2013):  No ischemia, EF 72%; Low Risk  . Hyperlipemia   . Hypertension   . Irritable   . Itch    skin  . Keratosis   . Macular degeneration    left eye  . Nausea    little vomitting  . Osteoporosis   . Pancreatic cyst   . Pancreatic mass   . Panic attacks   . Persistent headaches   . Pulmonary hypertension ([place])   . Rapid heart rate    some times  . Renal failure    transplant - 1983,  followed by Dr. Justin Mend   . Sleep apnea    uses at bedtime- 2liters/min , negative- apnea., last study- 2/017  . Sleep difficulties   . Stroke ([place])   . TIA (transient ischemic attack)     Current Outpatient Prescriptions on File Prior to Visit  Medication Sig Dispense Refill  . amLODipine (NORVASC) 5 MG tablet Take 1 tablet (5 mg total) by mouth daily. 90 tablet 3  . atorvastatin (LIPITOR) 20  MG tablet Take 20 mg by mouth daily.     Marland Kitchen azaTHIOprine (IMURAN) 50 MG tablet Take 50 mg by mouth daily.     . cycloSPORINE (RESTASIS) 0.05 % ophthalmic emulsion Place 1 drop into both eyes 2 (two) times daily.      Marland Kitchen FLUoxetine (PROZAC) 20 MG capsule Take 60 mg by mouth daily.     Marland Kitchen HYDROcodone-acetaminophen (NORCO) 10-325 MG per tablet Take 1 tablet by mouth every 6 (six) hours as needed (pain).     . metoprolol (LOPRESSOR) 50 MG tablet Take 1 tablet (50 mg total) by mouth 2 (two) times daily. Please call and schedule a six month follow up appointment with Dr Irish Lack per last office visit (Patient taking differently: Take 75 mg by mouth 2 (two) times daily. Please call and schedule a six month follow up appointment with Dr Irish Lack per last office visit) 60 tablet 6  . omeprazole (PRILOSEC) 40 MG capsule Take 40 mg by mouth daily.    . predniSONE (DELTASONE) 5 MG tablet Take 7.5 mg by mouth daily with breakfast.     . Probiotic CAPS Take 1 capsule by mouth daily.    . QUEtiapine (SEROQUEL) 200 MG tablet Take 200 mg by mouth at bedtime.     Marland Kitchen trimethoprim (TRIMPEX) 100 MG tablet Take 1 tablet (100 mg total) by mouth at bedtime. continuous    . ondansetron (ZOFRAN) 4 MG tablet Take 1 tablet (4 mg total) by mouth every 8 (eight) hours as needed for nausea or vomiting. (Patient not taking: Reported on 08/15/2016) 30 tablet 1  . OXYGEN Inhale 1 L into the lungs See admin instructions. Use at bedtime and as needed during activity     No current facility-administered medications on file prior to visit.     Allergies  Allergen Reactions  . Amoxicillin Diarrhea    Has patient had a PCN reaction causing immediate rash, facial/tongue/throat swelling, SOB or lightheadedness with hypotension: No Has patient had a PCN reaction causing severe rash involving mucus membranes or skin necrosis: No Has patient had a PCN reaction that required hospitalization No Has patient had a PCN reaction occurring within  the last 10 years: Yes If all of the above answers are "NO", then may proceed with Cephalosporin use.   . Tape Rash    Blood pressure 124/84, pulse (!) 47.   Assessment/Plan: Hypertension: BP at  goal today in office. For the most part her home pressures are <140/90. Given her age and feeling of lethargy when low will not change medications today. Advised if she has another episode of high HR and/or is symptomatic to proceed to ER.    Thank you, Lelan Pons. Patterson [place], [place]

## 2016-08-18 ENCOUNTER — Encounter: Payer: Self-pay | Admitting: Pharmacist

## 2016-08-30 DIAGNOSIS — M810 Age-related osteoporosis without current pathological fracture: Secondary | ICD-10-CM | POA: Diagnosis not present

## 2016-09-14 DIAGNOSIS — F330 Major depressive disorder, recurrent, mild: Secondary | ICD-10-CM | POA: Diagnosis not present

## 2016-09-22 DIAGNOSIS — D136 Benign neoplasm of pancreas: Secondary | ICD-10-CM | POA: Diagnosis not present

## 2016-10-11 DIAGNOSIS — J449 Chronic obstructive pulmonary disease, unspecified: Secondary | ICD-10-CM | POA: Diagnosis not present

## 2016-10-11 DIAGNOSIS — I10 Essential (primary) hypertension: Secondary | ICD-10-CM | POA: Diagnosis not present

## 2016-10-11 DIAGNOSIS — D638 Anemia in other chronic diseases classified elsewhere: Secondary | ICD-10-CM | POA: Diagnosis not present

## 2016-10-11 DIAGNOSIS — Z681 Body mass index (BMI) 19 or less, adult: Secondary | ICD-10-CM | POA: Diagnosis not present

## 2016-10-11 DIAGNOSIS — F329 Major depressive disorder, single episode, unspecified: Secondary | ICD-10-CM | POA: Diagnosis not present

## 2016-10-11 DIAGNOSIS — R635 Abnormal weight gain: Secondary | ICD-10-CM | POA: Diagnosis not present

## 2016-10-11 DIAGNOSIS — R300 Dysuria: Secondary | ICD-10-CM | POA: Diagnosis not present

## 2016-10-11 DIAGNOSIS — E46 Unspecified protein-calorie malnutrition: Secondary | ICD-10-CM | POA: Diagnosis not present

## 2016-10-11 DIAGNOSIS — D1724 Benign lipomatous neoplasm of skin and subcutaneous tissue of left leg: Secondary | ICD-10-CM | POA: Diagnosis not present

## 2016-10-12 DIAGNOSIS — H26492 Other secondary cataract, left eye: Secondary | ICD-10-CM | POA: Diagnosis not present

## 2016-10-12 DIAGNOSIS — H353112 Nonexudative age-related macular degeneration, right eye, intermediate dry stage: Secondary | ICD-10-CM | POA: Diagnosis not present

## 2016-10-12 DIAGNOSIS — H353221 Exudative age-related macular degeneration, left eye, with active choroidal neovascularization: Secondary | ICD-10-CM | POA: Diagnosis not present

## 2016-10-12 DIAGNOSIS — H35033 Hypertensive retinopathy, bilateral: Secondary | ICD-10-CM | POA: Diagnosis not present

## 2016-10-26 DIAGNOSIS — G894 Chronic pain syndrome: Secondary | ICD-10-CM | POA: Diagnosis not present

## 2016-10-26 DIAGNOSIS — G8929 Other chronic pain: Secondary | ICD-10-CM | POA: Diagnosis not present

## 2016-10-26 DIAGNOSIS — M5442 Lumbago with sciatica, left side: Secondary | ICD-10-CM | POA: Diagnosis not present

## 2016-11-08 ENCOUNTER — Other Ambulatory Visit: Payer: Self-pay | Admitting: Internal Medicine

## 2016-11-08 DIAGNOSIS — Z1231 Encounter for screening mammogram for malignant neoplasm of breast: Secondary | ICD-10-CM

## 2016-11-09 DIAGNOSIS — Z23 Encounter for immunization: Secondary | ICD-10-CM | POA: Diagnosis not present

## 2016-12-04 ENCOUNTER — Ambulatory Visit: Payer: Medicare Other

## 2016-12-05 ENCOUNTER — Ambulatory Visit
Admission: RE | Admit: 2016-12-05 | Discharge: 2016-12-05 | Disposition: A | Discharge status: Home / Self Care | Payer: Medicare Other | Source: Ambulatory Visit | Attending: Internal Medicine | Admitting: Internal Medicine

## 2016-12-05 DIAGNOSIS — Z1231 Encounter for screening mammogram for malignant neoplasm of breast: Secondary | ICD-10-CM

## 2016-12-06 ENCOUNTER — Other Ambulatory Visit: Payer: Self-pay | Admitting: Internal Medicine

## 2016-12-06 DIAGNOSIS — R928 Other abnormal and inconclusive findings on diagnostic imaging of breast: Secondary | ICD-10-CM

## 2016-12-12 ENCOUNTER — Other Ambulatory Visit: Payer: Self-pay | Admitting: Internal Medicine

## 2016-12-12 ENCOUNTER — Ambulatory Visit
Admission: RE | Admit: 2016-12-12 | Discharge: 2016-12-12 | Disposition: A | Discharge status: Home / Self Care | Payer: Medicare Other | Source: Ambulatory Visit | Attending: Internal Medicine | Admitting: Internal Medicine

## 2016-12-12 DIAGNOSIS — R928 Other abnormal and inconclusive findings on diagnostic imaging of breast: Secondary | ICD-10-CM | POA: Diagnosis not present

## 2016-12-12 DIAGNOSIS — N6310 Unspecified lump in the right breast, unspecified quadrant: Secondary | ICD-10-CM

## 2016-12-12 DIAGNOSIS — N6320 Unspecified lump in the left breast, unspecified quadrant: Secondary | ICD-10-CM | POA: Diagnosis not present

## 2016-12-13 ENCOUNTER — Telehealth: Payer: Self-pay | Admitting: Interventional Cardiology

## 2016-12-13 DIAGNOSIS — R000 Tachycardia, unspecified: Secondary | ICD-10-CM

## 2016-12-13 NOTE — Telephone Encounter (Signed)
Received call directly from operator from patient who c/o fast HR. She also c/o SOB, mild leg swelling, and feels like she is retaining some fluid. She denies recent fever or illness.  States was unable to sleep the night before last and that was the first time she noticed the elevated HR; Unsure whether HR is regular or irregular.  When asked about chest discomfort she c/o heartburn, and states she takes omeprazole for this. I advised her that I will review with Dr. Irish Lack, who is in the office today, and will call her back with his advice. She verbalized understanding and agreement.

## 2016-12-13 NOTE — Telephone Encounter (Signed)
STAT if HR is under 50 or over 120 (normal HR is 60-100 beats per minute)  1) What is your heart rate? 96  2) Do you have a log of your heart rate readings (document readings)? Tuesday  //  110/80  121 hr.  115/88  110 hr    112/90  101hr  120/79  118hr      Today did not give times  112/87  95   92/80    104hr      85/66   122hr  3) Do you have any other symptoms? SOB , weak

## 2016-12-13 NOTE — Telephone Encounter (Signed)
Follow up    Pt letting you know bp is 109/77 hr 92 it has come down since she last spoke with nurse, she is leaving the house and needs you to call her on cell phone.

## 2016-12-13 NOTE — Telephone Encounter (Signed)
Spoke with patient and advised her that Dr. Irish Lack wants patient to wear a 30 day monitor for evaluation of her fast HR. She asks if she should continue the amlodipine based on her BP readings. I asked her about dizziness or light-headedness and she states she does have this occasionally. I advised her to continue current medications and to continue to monitor BP and HR. I advised her that BP medication changes need to be made based on several weeks worth of data. I advised that someone from our office will call her to schedule her monitor. She verbalized understanding and agreement and thanked me for the call.

## 2016-12-21 IMAGING — RF DG SINUS / FISTULA TRACT / ABSCESSOGRAM
2 series · 5 of 5 positions shown · non-contrast
Comparison: none

INDICATION: Status post distal pancreatectomy, postop pancreatic fluid
collection, status post percutaneous drain 12/24/2015

[Series 1: one shot · 1 of 1 slices shown]
[im 1/1]
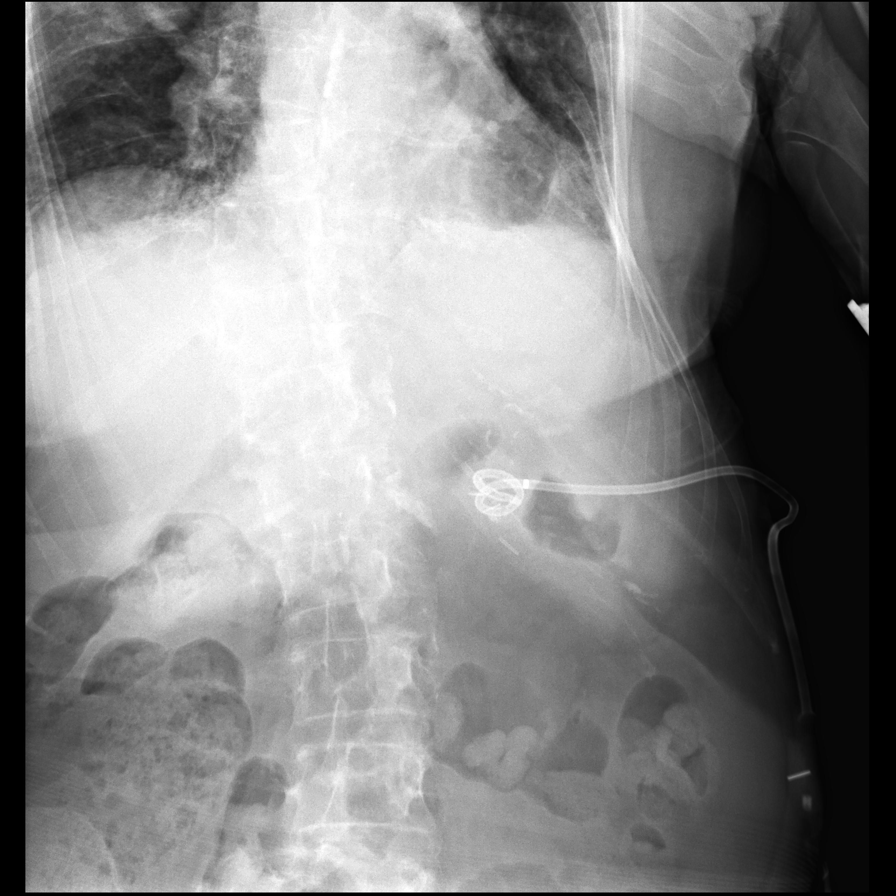

[Series 2: sequence · 4 of 88 frames shown]
[frame 2/88]
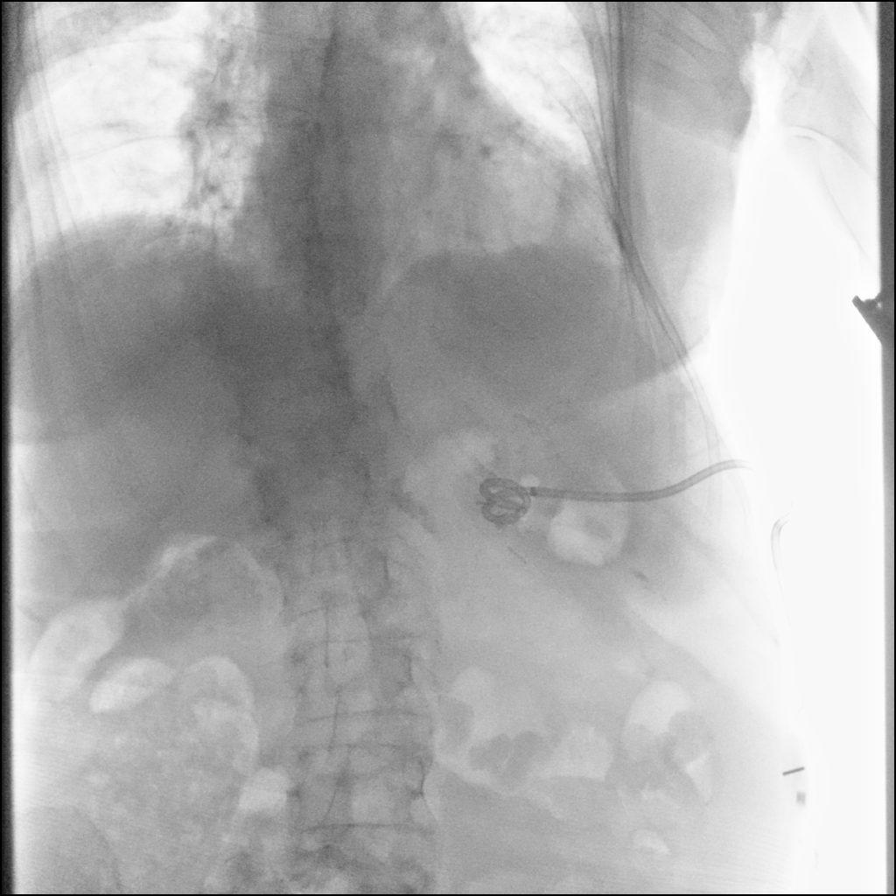
[frame 14/88]
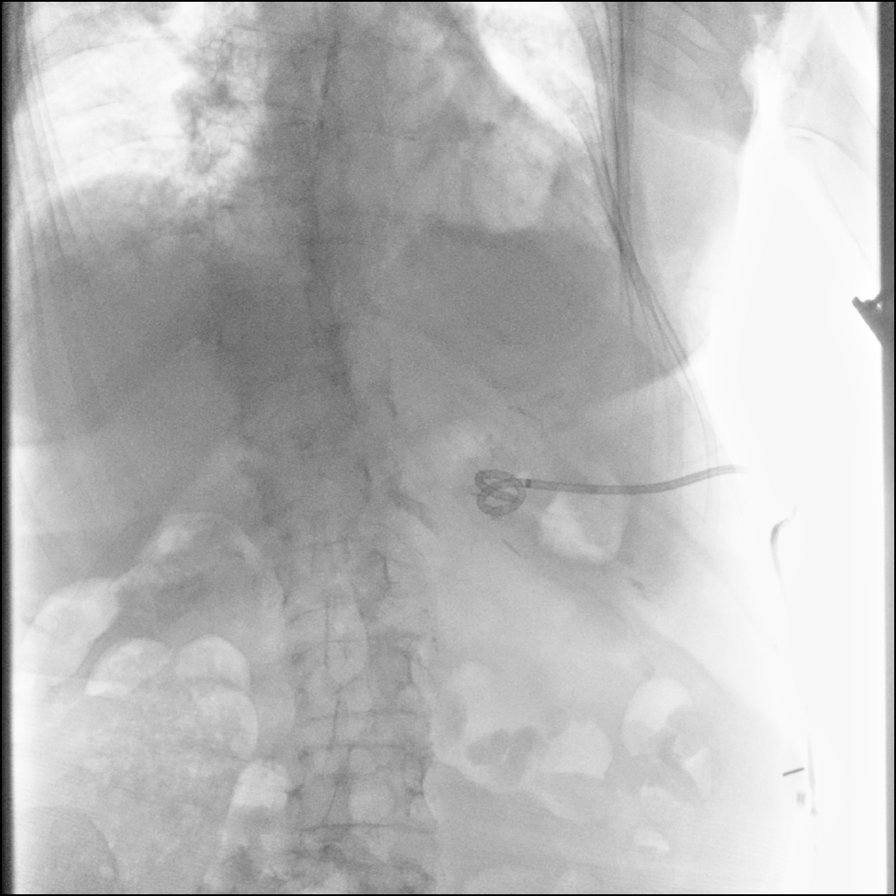
[frame 45/88]
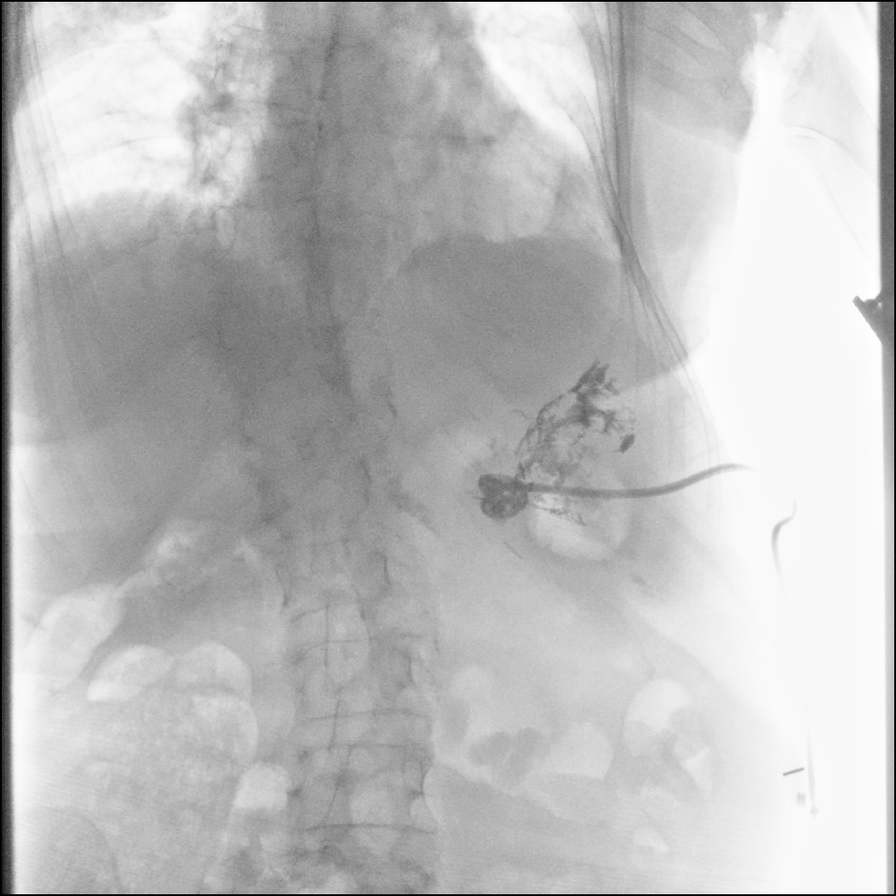
[frame 75/88]
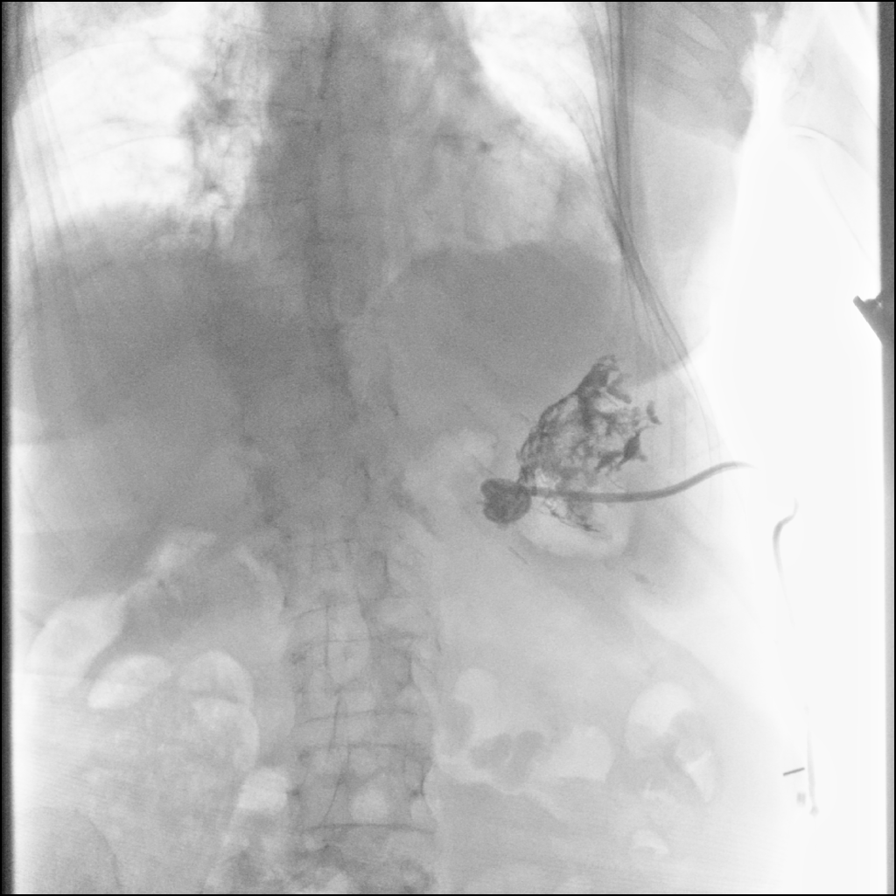

[5 of 5 positions shown; findings below may reference images not displayed]

EXAM:
ABSCESS INJECTION

MEDICATIONS:
None.

ANESTHESIA/SEDATION:
None.

COMPLICATIONS:
None immediate.

PROCEDURE:
Under fluoroscopy, the existing left upper quadrant pancreatic fluid
collection drain was injected with contrast. Fluoroscopic imaging
performed. Pancreatic abscess cavity is collapsed however small
residual soft tissue tracts persist throughout the left upper
quadrant surgical site but no visualized fistula to the pancreatic
duct.
IMPRESSION: Collapsed abscess cavity at the pancreatic bed surgical site.

Irregular soft tissue sinus tracts throughout the left upper
quadrant surgical site remain but no evidence of fistula to the
pancreatic duct.

## 2016-12-26 DIAGNOSIS — F330 Major depressive disorder, recurrent, mild: Secondary | ICD-10-CM | POA: Diagnosis not present

## 2016-12-28 ENCOUNTER — Ambulatory Visit (INDEPENDENT_AMBULATORY_CARE_PROVIDER_SITE_OTHER): Payer: Medicare Other

## 2016-12-28 ENCOUNTER — Telehealth: Payer: Self-pay

## 2016-12-28 DIAGNOSIS — Z7901 Long term (current) use of anticoagulants: Secondary | ICD-10-CM

## 2016-12-28 DIAGNOSIS — R000 Tachycardia, unspecified: Secondary | ICD-10-CM

## 2016-12-28 DIAGNOSIS — R002 Palpitations: Secondary | ICD-10-CM | POA: Diagnosis not present

## 2016-12-28 DIAGNOSIS — I4891 Unspecified atrial fibrillation: Secondary | ICD-10-CM

## 2016-12-28 MED ORDER — APIXABAN 5 MG PO TABS: 5.0000 mg | ORAL_TABLET | Freq: Two times a day (BID) | ORAL | 11 refills | Status: AC

## 2016-12-28 MED ORDER — METOPROLOL TARTRATE 50 MG PO TABS
100.0000 mg | ORAL_TABLET | Freq: Two times a day (BID) | ORAL | 11 refills | Status: DC
Start: 2016-12-28 — End: 2017-01-24

## 2016-12-28 NOTE — Telephone Encounter (Signed)
Reviewed with Dr. Irish Lack who recommends that the patient increase metoprolol to 75 mg BID, schedule F/U appointment in 1 week, and start anticoagulation. 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 She is no longer taking coumadin. Dr. Irish Lack requests that I speak with [facility] given the patient's Hx of Renal Transplant and other medications. Discussed with Raquel, RPH. Patient's most recent labs through [facility] are: Hbg- 11.6, Cr- 0.85. Raquel, RPH recommends that the patient start Eliquis 5 mg BID and be seen in the Coumadin Clinic with BMET and CBC in one month.   Called patient and made her aware of recommendations. Patient states that she is already taking metoprolol 75 mg BID. Discussed with Dr. Irish Lack. Advised patient to increase metoprolol to 100 mg BID. Made patient aware that Eliquis samples will be left up front for her to pick up in the morning along with a 30 day free card. Appointment scheduled for patient to follow up with Dr. Irish Lack on 11/15 at  11:40 AM. Appointment scheduled in Coumadin Clinic on 12/10 at 1:00 PM as well as Lab appointment for BMET and CBC on 12/10. Reviewed signs and symptoms of bleeding. Advised patient to call if she has any questions and to be sure to keep appointments. Patient verbalized understanding and thanked me for the call.

## 2016-12-28 NOTE — Telephone Encounter (Signed)
Received call from [facility] stating that patient is in afib with a HR 100-120. Patient stated that when her HR went up to 119 she felt "weak and short of breath , like I couldn't catch my breath" . Patient confirmed that she is taking metoprolol 50 mg BID. Informed patient that I would send event monitor report to Dr. Irish Lack to review for further recommendations and call back with further instructions.

## 2016-12-28 NOTE — Addendum Note (Signed)
Addended by: Drue Novel I on: 12/28/2016 06:24 PM   Modules accepted: Orders

## 2016-12-30 ENCOUNTER — Encounter: Payer: Self-pay | Admitting: Interventional Cardiology

## 2017-01-01 ENCOUNTER — Telehealth: Payer: Self-pay

## 2017-01-01 NOTE — Telephone Encounter (Signed)
MyChart Message from patient:   From: GERI HEPLER  Sent: 12/30/2016  1:41 PM  To: [facility] Triage  Subject: Visit Follow-Up Question               ----- Message from [place], Generic sent at 12/30/2016 1:41 PM EST -----    Heart rate still running in 120s. Feel like I'm retaining fluid. Should I restart ladies? Please call my daughter, [name] at [phone]. Thanks

## 2017-01-01 NOTE — Telephone Encounter (Signed)
Called and spoke to daughter Danton Clap (DPR on file) per patient's MyChart request who states that the patient feels like she may be retaining fluid. She states that the patient is SOB with activity and that her weight has been fluctuating. She states that the patient weighs in the morning and can gain 5 pounds by the evening. She states that she is unsure if she has any LE swelling but thinks that she does. She states that the patient took lasix in the past. Advised for the patient to weigh herself daily at the same time everyday with the same amount of clothes to get a more accurate assessment of weight gain. Also advised for the patient to avoid salt in her diet and elevate extremities. Made daughter aware that the patient may take lasix x 1 to see if her symptoms improve. Patient has an appointment on Thursday with Dr. Irish Lack for f/u for new onset Afib. Patient's HR is still elevated at times in the 120s. Patient has started Eliquis and is taking her increased dose of metoprolol (100 mg BID) for HR control. Instructed for the patient to keep appointment for further evaluation as her atrial fibrillation is likely contributing to her symptoms and to let us know if her symptoms worsen before then. Daughter verbalized understanding and thanked me for the call.

## 2017-01-02 ENCOUNTER — Encounter: Payer: Self-pay | Admitting: Interventional Cardiology

## 2017-01-03 ENCOUNTER — Encounter: Payer: Self-pay | Admitting: Interventional Cardiology

## 2017-01-03 ENCOUNTER — Inpatient Hospital Stay (HOSPITAL_COMMUNITY)
Admission: EM | Admit: 2017-01-03 | Discharge: 2017-01-24 | DRG: 004 | Disposition: A | Discharge status: Other Acute Inpatient Hospital | Payer: Medicare Other | Attending: Pulmonary Disease | Admitting: Pulmonary Disease

## 2017-01-03 ENCOUNTER — Emergency Department (HOSPITAL_COMMUNITY): Payer: Medicare Other

## 2017-01-03 ENCOUNTER — Encounter (HOSPITAL_COMMUNITY): Payer: Self-pay | Admitting: Emergency Medicine

## 2017-01-03 DIAGNOSIS — I481 Persistent atrial fibrillation: Secondary | ICD-10-CM | POA: Diagnosis present

## 2017-01-03 DIAGNOSIS — Z940 Kidney transplant status: Secondary | ICD-10-CM | POA: Diagnosis not present

## 2017-01-03 DIAGNOSIS — K9189 Other postprocedural complications and disorders of digestive system: Secondary | ICD-10-CM

## 2017-01-03 DIAGNOSIS — B965 Pseudomonas (aeruginosa) (mallei) (pseudomallei) as the cause of diseases classified elsewhere: Secondary | ICD-10-CM | POA: Diagnosis present

## 2017-01-03 DIAGNOSIS — Z430 Encounter for attention to tracheostomy: Secondary | ICD-10-CM

## 2017-01-03 DIAGNOSIS — F410 Panic disorder [episodic paroxysmal anxiety] without agoraphobia: Secondary | ICD-10-CM | POA: Diagnosis present

## 2017-01-03 DIAGNOSIS — Z681 Body mass index (BMI) 19 or less, adult: Secondary | ICD-10-CM

## 2017-01-03 DIAGNOSIS — J690 Pneumonitis due to inhalation of food and vomit: Secondary | ICD-10-CM | POA: Diagnosis not present

## 2017-01-03 DIAGNOSIS — I5033 Acute on chronic diastolic (congestive) heart failure: Secondary | ICD-10-CM | POA: Diagnosis present

## 2017-01-03 DIAGNOSIS — I132 Hypertensive heart and chronic kidney disease with heart failure and with stage 5 chronic kidney disease, or end stage renal disease: Secondary | ICD-10-CM | POA: Diagnosis present

## 2017-01-03 DIAGNOSIS — Z881 Allergy status to other antibiotic agents status: Secondary | ICD-10-CM

## 2017-01-03 DIAGNOSIS — J181 Lobar pneumonia, unspecified organism: Secondary | ICD-10-CM | POA: Diagnosis present

## 2017-01-03 DIAGNOSIS — R140 Abdominal distension (gaseous): Secondary | ICD-10-CM | POA: Diagnosis not present

## 2017-01-03 DIAGNOSIS — J440 Chronic obstructive pulmonary disease with acute lower respiratory infection: Secondary | ICD-10-CM | POA: Diagnosis present

## 2017-01-03 DIAGNOSIS — Z801 Family history of malignant neoplasm of trachea, bronchus and lung: Secondary | ICD-10-CM

## 2017-01-03 DIAGNOSIS — I714 Abdominal aortic aneurysm, without rupture: Secondary | ICD-10-CM | POA: Diagnosis present

## 2017-01-03 DIAGNOSIS — Y95 Nosocomial condition: Secondary | ICD-10-CM | POA: Diagnosis present

## 2017-01-03 DIAGNOSIS — D638 Anemia in other chronic diseases classified elsewhere: Secondary | ICD-10-CM | POA: Diagnosis present

## 2017-01-03 DIAGNOSIS — D500 Iron deficiency anemia secondary to blood loss (chronic): Secondary | ICD-10-CM | POA: Diagnosis present

## 2017-01-03 DIAGNOSIS — E872 Acidosis: Secondary | ICD-10-CM | POA: Diagnosis present

## 2017-01-03 DIAGNOSIS — Z9911 Dependence on respirator [ventilator] status: Secondary | ICD-10-CM

## 2017-01-03 DIAGNOSIS — T380X5A Adverse effect of glucocorticoids and synthetic analogues, initial encounter: Secondary | ICD-10-CM | POA: Diagnosis present

## 2017-01-03 DIAGNOSIS — Y838 Other surgical procedures as the cause of abnormal reaction of the patient, or of later complication, without mention of misadventure at the time of the procedure: Secondary | ICD-10-CM | POA: Diagnosis not present

## 2017-01-03 DIAGNOSIS — T797XXA Traumatic subcutaneous emphysema, initial encounter: Secondary | ICD-10-CM | POA: Diagnosis not present

## 2017-01-03 DIAGNOSIS — K2970 Gastritis, unspecified, without bleeding: Secondary | ICD-10-CM | POA: Diagnosis not present

## 2017-01-03 DIAGNOSIS — R6521 Severe sepsis with septic shock: Secondary | ICD-10-CM | POA: Diagnosis present

## 2017-01-03 DIAGNOSIS — I2510 Atherosclerotic heart disease of native coronary artery without angina pectoris: Secondary | ICD-10-CM | POA: Diagnosis present

## 2017-01-03 DIAGNOSIS — R64 Cachexia: Secondary | ICD-10-CM | POA: Diagnosis present

## 2017-01-03 DIAGNOSIS — T508X5A Adverse effect of diagnostic agents, initial encounter: Secondary | ICD-10-CM | POA: Diagnosis not present

## 2017-01-03 DIAGNOSIS — I5032 Chronic diastolic (congestive) heart failure: Secondary | ICD-10-CM | POA: Diagnosis not present

## 2017-01-03 DIAGNOSIS — Z8249 Family history of ischemic heart disease and other diseases of the circulatory system: Secondary | ICD-10-CM

## 2017-01-03 DIAGNOSIS — Z9049 Acquired absence of other specified parts of digestive tract: Secondary | ICD-10-CM

## 2017-01-03 DIAGNOSIS — K828 Other specified diseases of gallbladder: Secondary | ICD-10-CM | POA: Diagnosis not present

## 2017-01-03 DIAGNOSIS — J189 Pneumonia, unspecified organism: Secondary | ICD-10-CM | POA: Diagnosis not present

## 2017-01-03 DIAGNOSIS — E876 Hypokalemia: Secondary | ICD-10-CM | POA: Diagnosis not present

## 2017-01-03 DIAGNOSIS — D6489 Other specified anemias: Secondary | ICD-10-CM | POA: Diagnosis present

## 2017-01-03 DIAGNOSIS — J849 Interstitial pulmonary disease, unspecified: Secondary | ICD-10-CM | POA: Diagnosis present

## 2017-01-03 DIAGNOSIS — Z452 Encounter for adjustment and management of vascular access device: Secondary | ICD-10-CM

## 2017-01-03 DIAGNOSIS — J9690 Respiratory failure, unspecified, unspecified whether with hypoxia or hypercapnia: Secondary | ICD-10-CM

## 2017-01-03 DIAGNOSIS — Z8673 Personal history of transient ischemic attack (TIA), and cerebral infarction without residual deficits: Secondary | ICD-10-CM

## 2017-01-03 DIAGNOSIS — J9621 Acute and chronic respiratory failure with hypoxia: Secondary | ICD-10-CM | POA: Diagnosis present

## 2017-01-03 DIAGNOSIS — N390 Urinary tract infection, site not specified: Secondary | ICD-10-CM | POA: Diagnosis not present

## 2017-01-03 DIAGNOSIS — E1165 Type 2 diabetes mellitus with hyperglycemia: Secondary | ICD-10-CM | POA: Diagnosis not present

## 2017-01-03 DIAGNOSIS — Z4682 Encounter for fitting and adjustment of non-vascular catheter: Secondary | ICD-10-CM | POA: Diagnosis not present

## 2017-01-03 DIAGNOSIS — N186 End stage renal disease: Secondary | ICD-10-CM | POA: Diagnosis present

## 2017-01-03 DIAGNOSIS — Z85828 Personal history of other malignant neoplasm of skin: Secondary | ICD-10-CM

## 2017-01-03 DIAGNOSIS — G92 Toxic encephalopathy: Secondary | ICD-10-CM | POA: Diagnosis present

## 2017-01-03 DIAGNOSIS — N170 Acute kidney failure with tubular necrosis: Secondary | ICD-10-CM | POA: Diagnosis present

## 2017-01-03 DIAGNOSIS — K2941 Chronic atrophic gastritis with bleeding: Secondary | ICD-10-CM | POA: Diagnosis not present

## 2017-01-03 DIAGNOSIS — Z79899 Other long term (current) drug therapy: Secondary | ICD-10-CM

## 2017-01-03 DIAGNOSIS — I361 Nonrheumatic tricuspid (valve) insufficiency: Secondary | ICD-10-CM | POA: Diagnosis not present

## 2017-01-03 DIAGNOSIS — K869 Disease of pancreas, unspecified: Secondary | ICD-10-CM | POA: Diagnosis present

## 2017-01-03 DIAGNOSIS — J188 Other pneumonia, unspecified organism: Secondary | ICD-10-CM | POA: Diagnosis not present

## 2017-01-03 DIAGNOSIS — I10 Essential (primary) hypertension: Secondary | ICD-10-CM | POA: Diagnosis not present

## 2017-01-03 DIAGNOSIS — J9601 Acute respiratory failure with hypoxia: Secondary | ICD-10-CM | POA: Diagnosis not present

## 2017-01-03 DIAGNOSIS — I4891 Unspecified atrial fibrillation: Secondary | ICD-10-CM | POA: Diagnosis not present

## 2017-01-03 DIAGNOSIS — Z01818 Encounter for other preprocedural examination: Secondary | ICD-10-CM

## 2017-01-03 DIAGNOSIS — E43 Unspecified severe protein-calorie malnutrition: Secondary | ICD-10-CM | POA: Diagnosis not present

## 2017-01-03 DIAGNOSIS — N141 Nephropathy induced by other drugs, medicaments and biological substances: Secondary | ICD-10-CM | POA: Diagnosis not present

## 2017-01-03 DIAGNOSIS — M810 Age-related osteoporosis without current pathological fracture: Secondary | ICD-10-CM | POA: Diagnosis present

## 2017-01-03 DIAGNOSIS — R0602 Shortness of breath: Secondary | ICD-10-CM | POA: Diagnosis not present

## 2017-01-03 DIAGNOSIS — F329 Major depressive disorder, single episode, unspecified: Secondary | ICD-10-CM | POA: Diagnosis present

## 2017-01-03 DIAGNOSIS — R740 Nonspecific elevation of levels of transaminase and lactic acid dehydrogenase [LDH]: Secondary | ICD-10-CM | POA: Diagnosis not present

## 2017-01-03 DIAGNOSIS — A419 Sepsis, unspecified organism: Principal | ICD-10-CM | POA: Diagnosis present

## 2017-01-03 DIAGNOSIS — D649 Anemia, unspecified: Secondary | ICD-10-CM | POA: Diagnosis not present

## 2017-01-03 DIAGNOSIS — J9600 Acute respiratory failure, unspecified whether with hypoxia or hypercapnia: Secondary | ICD-10-CM | POA: Diagnosis not present

## 2017-01-03 DIAGNOSIS — E785 Hyperlipidemia, unspecified: Secondary | ICD-10-CM | POA: Diagnosis present

## 2017-01-03 DIAGNOSIS — I509 Heart failure, unspecified: Secondary | ICD-10-CM | POA: Diagnosis not present

## 2017-01-03 DIAGNOSIS — J984 Other disorders of lung: Secondary | ICD-10-CM | POA: Diagnosis not present

## 2017-01-03 DIAGNOSIS — E870 Hyperosmolality and hypernatremia: Secondary | ICD-10-CM | POA: Diagnosis present

## 2017-01-03 DIAGNOSIS — Z4659 Encounter for fitting and adjustment of other gastrointestinal appliance and device: Secondary | ICD-10-CM

## 2017-01-03 DIAGNOSIS — Z930 Tracheostomy status: Secondary | ICD-10-CM

## 2017-01-03 DIAGNOSIS — R05 Cough: Secondary | ICD-10-CM | POA: Diagnosis not present

## 2017-01-03 DIAGNOSIS — K219 Gastro-esophageal reflux disease without esophagitis: Secondary | ICD-10-CM | POA: Diagnosis present

## 2017-01-03 DIAGNOSIS — J449 Chronic obstructive pulmonary disease, unspecified: Secondary | ICD-10-CM | POA: Diagnosis not present

## 2017-01-03 DIAGNOSIS — L8990 Pressure ulcer of unspecified site, unspecified stage: Secondary | ICD-10-CM

## 2017-01-03 DIAGNOSIS — R0603 Acute respiratory distress: Secondary | ICD-10-CM | POA: Diagnosis not present

## 2017-01-03 DIAGNOSIS — K922 Gastrointestinal hemorrhage, unspecified: Secondary | ICD-10-CM | POA: Diagnosis not present

## 2017-01-03 DIAGNOSIS — Z87440 Personal history of urinary (tract) infections: Secondary | ICD-10-CM

## 2017-01-03 DIAGNOSIS — K921 Melena: Secondary | ICD-10-CM

## 2017-01-03 DIAGNOSIS — H3530 Unspecified macular degeneration: Secondary | ICD-10-CM | POA: Diagnosis present

## 2017-01-03 DIAGNOSIS — K3184 Gastroparesis: Secondary | ICD-10-CM | POA: Diagnosis present

## 2017-01-03 DIAGNOSIS — Z9981 Dependence on supplemental oxygen: Secondary | ICD-10-CM

## 2017-01-03 DIAGNOSIS — R918 Other nonspecific abnormal finding of lung field: Secondary | ICD-10-CM | POA: Diagnosis not present

## 2017-01-03 DIAGNOSIS — J811 Chronic pulmonary edema: Secondary | ICD-10-CM | POA: Diagnosis not present

## 2017-01-03 DIAGNOSIS — Z7901 Long term (current) use of anticoagulants: Secondary | ICD-10-CM

## 2017-01-03 DIAGNOSIS — T8189XA Other complications of procedures, not elsewhere classified, initial encounter: Secondary | ICD-10-CM | POA: Diagnosis not present

## 2017-01-03 DIAGNOSIS — E1143 Type 2 diabetes mellitus with diabetic autonomic (poly)neuropathy: Secondary | ICD-10-CM | POA: Diagnosis present

## 2017-01-03 DIAGNOSIS — G2581 Restless legs syndrome: Secondary | ICD-10-CM | POA: Diagnosis present

## 2017-01-03 DIAGNOSIS — E892 Postprocedural hypoparathyroidism: Secondary | ICD-10-CM | POA: Diagnosis present

## 2017-01-03 DIAGNOSIS — R34 Anuria and oliguria: Secondary | ICD-10-CM | POA: Diagnosis not present

## 2017-01-03 DIAGNOSIS — Z91048 Other nonmedicinal substance allergy status: Secondary | ICD-10-CM

## 2017-01-03 DIAGNOSIS — N179 Acute kidney failure, unspecified: Secondary | ICD-10-CM | POA: Diagnosis not present

## 2017-01-03 DIAGNOSIS — J90 Pleural effusion, not elsewhere classified: Secondary | ICD-10-CM | POA: Diagnosis not present

## 2017-01-03 DIAGNOSIS — I272 Pulmonary hypertension, unspecified: Secondary | ICD-10-CM | POA: Diagnosis present

## 2017-01-03 DIAGNOSIS — Z87891 Personal history of nicotine dependence: Secondary | ICD-10-CM

## 2017-01-03 DIAGNOSIS — G4733 Obstructive sleep apnea (adult) (pediatric): Secondary | ICD-10-CM | POA: Diagnosis present

## 2017-01-03 DIAGNOSIS — R1084 Generalized abdominal pain: Secondary | ICD-10-CM | POA: Diagnosis not present

## 2017-01-03 DIAGNOSIS — Z7952 Long term (current) use of systemic steroids: Secondary | ICD-10-CM

## 2017-01-03 LAB — CBC WITH DIFFERENTIAL/PLATELET
Basophils Absolute: 0 10*3/uL (ref 0.0–0.1)
Basophils Relative: 0 %
EOS PCT: 1 %
Eosinophils Absolute: 0.1 10*3/uL (ref 0.0–0.7)
HCT: 34.7 % — ABNORMAL LOW (ref 36.0–46.0)
Hemoglobin: 11.3 g/dL — ABNORMAL LOW (ref 12.0–15.0)
LYMPHS ABS: 1.3 10*3/uL (ref 0.7–4.0)
LYMPHS PCT: 15 %
MCH: 30.7 pg (ref 26.0–34.0)
MCHC: 32.6 g/dL (ref 30.0–36.0)
MCV: 94.3 fL (ref 78.0–100.0)
MONO ABS: 1.2 10*3/uL — AB (ref 0.1–1.0)
MONOS PCT: 13 %
Neutro Abs: 6.4 10*3/uL (ref 1.7–7.7)
Neutrophils Relative %: 71 %
PLATELETS: 197 10*3/uL (ref 150–400)
RBC: 3.68 MIL/uL — ABNORMAL LOW (ref 3.87–5.11)
RDW: 14.8 % (ref 11.5–15.5)
WBC: 9 10*3/uL (ref 4.0–10.5)

## 2017-01-03 LAB — COMPREHENSIVE METABOLIC PANEL
ALT: 18 U/L (ref 14–54)
AST: 27 U/L (ref 15–41)
Albumin: 3.3 g/dL — ABNORMAL LOW (ref 3.5–5.0)
Alkaline Phosphatase: 65 U/L (ref 38–126)
Anion gap: 9 (ref 5–15)
BUN: 10 mg/dL (ref 6–20)
CHLORIDE: 104 mmol/L (ref 101–111)
CO2: 22 mmol/L (ref 22–32)
CREATININE: 0.99 mg/dL (ref 0.44–1.00)
Calcium: 8.6 mg/dL — ABNORMAL LOW (ref 8.9–10.3)
GFR, EST NON AFRICAN AMERICAN: 56 mL/min — AB (ref >60)
Glucose, Bld: 182 mg/dL — ABNORMAL HIGH (ref 65–99)
Potassium: 4.3 mmol/L (ref 3.5–5.1)
Sodium: 135 mmol/L (ref 135–145)
Total Bilirubin: 1.1 mg/dL (ref 0.3–1.2)
Total Protein: 6.4 g/dL — ABNORMAL LOW (ref 6.5–8.1)

## 2017-01-03 LAB — INFLUENZA PANEL BY PCR (TYPE A & B)
INFLBPCR: NEGATIVE
Influenza A By PCR: NEGATIVE

## 2017-01-03 LAB — PROTIME-INR
INR: 1.59
Prothrombin Time: 18.8 seconds — ABNORMAL HIGH (ref 11.4–15.2)

## 2017-01-03 LAB — BRAIN NATRIURETIC PEPTIDE: B Natriuretic Peptide: 2077.8 pg/mL — ABNORMAL HIGH (ref 0.0–100.0)

## 2017-01-03 LAB — I-STAT TROPONIN, ED: TROPONIN I, POC: 0.03 ng/mL (ref 0.00–0.08)

## 2017-01-03 LAB — I-STAT CG4 LACTIC ACID, ED
LACTIC ACID, VENOUS: 1.45 mmol/L (ref 0.5–1.9)
LACTIC ACID, VENOUS: 1.7 mmol/L (ref 0.5–1.9)

## 2017-01-03 LAB — TSH: TSH: 1.935 u[IU]/mL (ref 0.350–4.500)

## 2017-01-03 LAB — STREP PNEUMONIAE URINARY ANTIGEN: Strep Pneumo Urinary Antigen: NEGATIVE

## 2017-01-03 MED ORDER — FLUOXETINE HCL 20 MG PO CAPS: 60.0000 mg | ORAL_CAPSULE | Freq: Every day | ORAL | Status: DC

## 2017-01-03 MED ORDER — AZATHIOPRINE 50 MG PO TABS
50.0000 mg | ORAL_TABLET | Freq: Every day | ORAL | Status: DC
  Administered 2017-01-03: 50 mg via ORAL
  Filled 2017-01-03 (×2): qty 1

## 2017-01-03 MED ORDER — DEXTROSE 5 % IV SOLN
1.0000 g | INTRAVENOUS | Status: DC
  Administered 2017-01-03 – 2017-01-04 (×2): 1 g via INTRAVENOUS
  Filled 2017-01-03 (×3): qty 10

## 2017-01-03 MED ORDER — HYDROCODONE-ACETAMINOPHEN 10-325 MG PO TABS
1.0000 | ORAL_TABLET | Freq: Four times a day (QID) | ORAL | Status: DC | PRN
  Administered 2017-01-03: 1 via ORAL
  Filled 2017-01-03: qty 1

## 2017-01-03 MED ORDER — DEXTROSE 5 % IV SOLN
500.0000 mg | INTRAVENOUS | Status: DC
  Administered 2017-01-03 – 2017-01-04 (×2): 500 mg via INTRAVENOUS
  Filled 2017-01-03 (×3): qty 500

## 2017-01-03 MED ORDER — ONDANSETRON HCL 4 MG PO TABS: 4.0000 mg | ORAL_TABLET | Freq: Three times a day (TID) | ORAL | Status: DC | PRN

## 2017-01-03 MED ORDER — CYCLOSPORINE 0.05 % OP EMUL
1.0000 [drp] | Freq: Two times a day (BID) | OPHTHALMIC | Status: DC
  Administered 2017-01-03 – 2017-01-24 (×41): 1 [drp] via OPHTHALMIC
  Filled 2017-01-03 (×45): qty 1

## 2017-01-03 MED ORDER — ATORVASTATIN CALCIUM 20 MG PO TABS
20.0000 mg | ORAL_TABLET | Freq: Every day | ORAL | Status: DC
  Administered 2017-01-03: 20 mg via ORAL
  Filled 2017-01-03: qty 1

## 2017-01-03 MED ORDER — ALPRAZOLAM 0.25 MG PO TABS
0.2500 mg | ORAL_TABLET | Freq: Two times a day (BID) | ORAL | Status: DC | PRN
  Administered 2017-01-03: 0.25 mg via ORAL
  Filled 2017-01-03: qty 1

## 2017-01-03 MED ORDER — IPRATROPIUM-ALBUTEROL 0.5-2.5 (3) MG/3ML IN SOLN
3.0000 mL | Freq: Four times a day (QID) | RESPIRATORY_TRACT | Status: DC
  Administered 2017-01-03: 3 mL via RESPIRATORY_TRACT
  Filled 2017-01-03: qty 3

## 2017-01-03 MED ORDER — IOPAMIDOL (ISOVUE-370) INJECTION 76%
INTRAVENOUS | Status: AC
  Administered 2017-01-03: 08:00:00
  Filled 2017-01-03: qty 100

## 2017-01-03 MED ORDER — DEXTROSE 5 % IV SOLN
5.0000 mg/h | INTRAVENOUS | Status: DC
  Filled 2017-01-03 (×2): qty 100

## 2017-01-03 MED ORDER — LEVALBUTEROL HCL 0.63 MG/3ML IN NEBU
0.6300 mg | INHALATION_SOLUTION | Freq: Four times a day (QID) | RESPIRATORY_TRACT | Status: DC
  Administered 2017-01-03 – 2017-01-09 (×23): 0.63 mg via RESPIRATORY_TRACT
  Filled 2017-01-03 (×41): qty 3

## 2017-01-03 MED ORDER — APIXABAN 5 MG PO TABS
5.0000 mg | ORAL_TABLET | Freq: Two times a day (BID) | ORAL | Status: DC
  Administered 2017-01-03 (×2): 5 mg via ORAL
  Filled 2017-01-03 (×3): qty 1

## 2017-01-03 MED ORDER — QUETIAPINE FUMARATE 200 MG PO TABS
200.0000 mg | ORAL_TABLET | Freq: Every day | ORAL | Status: DC
  Administered 2017-01-03: 200 mg via ORAL
  Filled 2017-01-03 (×2): qty 1
  Filled 2017-01-03: qty 2

## 2017-01-03 MED ORDER — VANCOMYCIN HCL 500 MG IV SOLR
500.0000 mg | Freq: Two times a day (BID) | INTRAVENOUS | Status: DC
  Administered 2017-01-04: 500 mg via INTRAVENOUS
  Filled 2017-01-03 (×2): qty 500

## 2017-01-03 MED ORDER — PREDNISONE 5 MG PO TABS
7.5000 mg | ORAL_TABLET | Freq: Every day | ORAL | Status: DC
  Filled 2017-01-03 (×2): qty 1

## 2017-01-03 MED ORDER — DILTIAZEM HCL 100 MG IV SOLR
5.0000 mg/h | Freq: Once | INTRAVENOUS | Status: AC
  Administered 2017-01-03: 5 mg/h via INTRAVENOUS
  Filled 2017-01-03: qty 100

## 2017-01-03 MED ORDER — PANTOPRAZOLE SODIUM 40 MG PO TBEC
40.0000 mg | DELAYED_RELEASE_TABLET | Freq: Every day | ORAL | Status: DC
  Administered 2017-01-03: 40 mg via ORAL
  Filled 2017-01-03: qty 1

## 2017-01-03 MED ORDER — DILTIAZEM HCL 25 MG/5ML IV SOLN
10.0000 mg | Freq: Once | INTRAVENOUS | Status: AC
  Administered 2017-01-03: 10 mg via INTRAVENOUS
  Filled 2017-01-03: qty 5

## 2017-01-03 MED ORDER — VANCOMYCIN HCL IN DEXTROSE 1-5 GM/200ML-% IV SOLN
1000.0000 mg | Freq: Once | INTRAVENOUS | Status: AC
  Administered 2017-01-03: 1000 mg via INTRAVENOUS
  Filled 2017-01-03: qty 200

## 2017-01-03 MED ORDER — ONDANSETRON HCL 4 MG/2ML IJ SOLN: 4.0000 mg | Freq: Four times a day (QID) | INTRAMUSCULAR | Status: DC | PRN

## 2017-01-03 MED ORDER — FUROSEMIDE 10 MG/ML IJ SOLN
40.0000 mg | Freq: Once | INTRAMUSCULAR | Status: AC
  Administered 2017-01-03: 40 mg via INTRAVENOUS
  Filled 2017-01-03: qty 4

## 2017-01-03 MED ORDER — ACETAMINOPHEN 325 MG PO TABS: 650.0000 mg | ORAL_TABLET | ORAL | Status: DC | PRN

## 2017-01-03 MED ORDER — METOPROLOL TARTRATE 100 MG PO TABS
100.0000 mg | ORAL_TABLET | Freq: Two times a day (BID) | ORAL | Status: DC
  Administered 2017-01-03 (×2): 100 mg via ORAL
  Filled 2017-01-03: qty 2
  Filled 2017-01-03: qty 4

## 2017-01-03 MED ORDER — IPRATROPIUM BROMIDE 0.02 % IN SOLN
0.5000 mg | Freq: Four times a day (QID) | RESPIRATORY_TRACT | Status: DC
  Administered 2017-01-03 – 2017-01-09 (×23): 0.5 mg via RESPIRATORY_TRACT
  Filled 2017-01-03 (×23): qty 2.5

## 2017-01-03 MED ORDER — LEVALBUTEROL HCL 0.63 MG/3ML IN NEBU: 0.6300 mg | INHALATION_SOLUTION | RESPIRATORY_TRACT | Status: DC | PRN

## 2017-01-03 MED ORDER — LEVOFLOXACIN IN D5W 750 MG/150ML IV SOLN
750.0000 mg | Freq: Once | INTRAVENOUS | Status: DC
  Filled 2017-01-03: qty 150

## 2017-01-03 NOTE — Progress Notes (Signed)
Pt. Arrived to unit from ED. Pt currently on NRB at 15L O2 with sats at 98%. VS stable. Will continue to monitor.

## 2017-01-03 NOTE — ED Notes (Signed)
Purewick placed on pt. 

## 2017-01-03 NOTE — ED Notes (Signed)
Admitting MD at the bedside.  

## 2017-01-03 NOTE — Progress Notes (Signed)
Pharmacy Antibiotic Note  Morgan Cooper is a 71 y.o. female admitted on 01/03/2017 with pneumonia.  Pharmacy has been consulted for vancomycin dosing. Also has ceftriaxone and azithromycin ordered per MD. Afebrile, WBC wnl, LA 1.45. Noted history of renal transplant on immunosuppressives - SCr 0.99 on admit, CrCl~42. Estimated weight in the ED is 116 lbs per patient.  Plan: Vancomycin 1g IV x 1; then 500mg  IV q12h Ceftriaxone 1g IV q24h Azithromycin 500mg  IV q24h Monitor clinical progress, c/s, renal function F/u de-escalation plan/LOT, vancomycin trough as indicated      Temp (24hrs), Avg:98 F (36.7 C), Min:98 F (36.7 C), Max:98 F (36.7 C)  Recent Labs  Lab 01/03/17 0708 01/03/17 1004  WBC 9.0  --   CREATININE 0.99  --   LATICACIDVEN  --  1.45    CrCl cannot be calculated (Unknown ideal weight.).    Allergies  Allergen Reactions  . Amoxicillin Diarrhea    Has patient had a PCN reaction causing immediate rash, facial/tongue/throat swelling, SOB or lightheadedness with hypotension: No Has patient had a PCN reaction causing severe rash involving mucus membranes or skin necrosis: No Has patient had a PCN reaction that required hospitalization No Has patient had a PCN reaction occurring within the last 10 years: Yes If all of the above answers are "NO", then may proceed with Cephalosporin use.   . Tape Rash    Elicia Lamp, PharmD, BCPS Clinical Pharmacist 01/03/2017 11:45 AM

## 2017-01-03 NOTE — ED Provider Notes (Signed)
[facility] EMERGENCY DEPARTMENT Provider Note   CSN: [id number] Arrival date & time: 01/03/17  1607    History   Chief Complaint Chief Complaint  Patient presents with  . Shortness of Breath   HPI  Morgan Cooper is a 71 y.o. female presenting with acute hypoxia.  She was recently seen by Cardiology and diagnosed with new onset a-fib on Holter monitor.  Follows with Dr. Irish Lack.  Reports she has not been feeling well over the past couple of weeks and has been having shortness of breath which is worse with exertion .   She denies chest pain.. She reports she has been eating less but has been drinking fluids.  She states this morning  as she was going to the bathroom she experienced a near syncopal episode but did not fall or lose consciousness.  Her sister whom she lives with was at home at the time and was able to help her.  EMS was called.  They report an O2 sat of 76% at home and 88% on 2L [place].   Denies fever, chills, nausea, vomiting she reports some mild left-sided abdominal pain which is not a new symptom for her and is due to her kidney transplant.   She denies leg swelling.     Past Medical History:  Diagnosis Date  . Anxiety   . Arthritis    hips, legs, hand   . Barrett's esophagus   . Bone pain   . Bruises easily   . Cancer ([place])    skin cancer that has been removed- L hand  . CHF (congestive heart failure) ([place])   . Chills   . CVA (cerebral infarction)    [place] Strokes   . Depression   . Disturbed concentration   . Diverticulitis of large intestine with perforation Hx colectomy  . Esophageal stricture   . Fatigue   . Forgetfulness   . Gastroparesis   . Generalized headaches   . GERD (gastroesophageal reflux disease)   . Heart failure, diastolic, chronic ([place])   . History of hiatal hernia   . Hx of cardiovascular stress test    Lexiscan Myoview (06/2013):  No ischemia, EF 72%; Low Risk  . Hyperlipemia   . Hypertension   . Irritable   . Itch    skin  . Keratosis   . Macular degeneration    left eye  . Nausea    little vomitting  . Osteoporosis   . Pancreatic cyst   . Pancreatic mass   . Panic attacks   . Persistent headaches   . Pulmonary hypertension ([place])   . Rapid heart rate    some times  . Renal failure    transplant - 1983,  followed by Dr. Justin Mend   . Sleep apnea    uses at bedtime- 2liters/min , negative- apnea., last study- 2/017  . Sleep difficulties   . Stroke ([place])   . TIA (transient ischemic attack)    Patient Active Problem List   Diagnosis Date Noted  . Atrial fibrillation with RVR ([place]) 01/03/2017  . Community acquired pneumonia 01/03/2017  . Acute hypoxemic respiratory failure ([place]) 01/03/2017  . Protein-calorie malnutrition, severe 12/25/2015  . Dehydration 12/23/2015  . Pancreatic mass 12/07/2015  . Interstitial lung disease ([place]) 03/18/2015  . Obstructive lung disease ([place]) 03/18/2015  . Nocturnal hypoxemia 03/18/2015  . Pulmonary hypertension assoc with unclear multi-factorial mechanisms ([place]) 02/24/2015  . Hydronephrosis of kidney transplant, left 07/19/2014  . Acute pyelonephritis 07/15/2014  .  Dyspnea on exertion 10/23/2013  . Neoplasm of soft tissue, left scapula 09/09/2013  . Hypertension 06/12/2013  . HLD (hyperlipidemia) 06/12/2013  . Chronic diastolic heart failure ([place]) 06/12/2013  . History of renal transplant 06/12/2013  . Special screening for malignant neoplasms, colon 05/19/2013  . Unspecified vitamin D deficiency 12/30/2012  . Elevated parathyroid hormone 12/16/2012  . Atypical face pain 10/24/2012  . Headache 10/24/2012  . Unspecified transient cerebral ischemia 10/24/2012  . Stricture and stenosis of esophagus 01/16/2012  . Left groin pain 05/26/2010  . DIVERTICULITIS, COLON, WITH PERFORATION 05/05/2009  . GERD 04/23/2009  . WEIGHT LOSS-ABNORMAL 04/23/2009  . ABDOMINAL PAIN-MULTIPLE SITES 04/23/2009  . GASTROPARESIS 06/24/2008  . CVA 05/06/2008  . BARRETTS ESOPHAGUS  05/06/2008  . NAUSEA, CHRONIC 05/06/2008  . 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 APPENDECTOMY    . cataract surgery bilateral    . COLON RESECTION  2010   Colostomy  . COLOSTOMY  2011   Reversal   . colostomy reversal    . ESOPHAGEAL DILATION    . IR GENERIC HISTORICAL  01/06/2016   IR RADIOLOGIST EVAL & MGMT 01/06/2016 Greggory Keen, MD [facility] INTERV RAD  . IR GENERIC HISTORICAL  01/20/2016   IR RADIOLOGIST EVAL & MGMT 01/20/2016 Sandi Mariscal, MD [facility] INTERV RAD  . IR GENERIC HISTORICAL  02/03/2016   IR RADIOLOGIST EVAL & MGMT 02/03/2016 Saverio Danker, PA-C [facility] INTERV RAD  . IR GENERIC HISTORICAL  02/24/2016   IR RADIOLOGIST EVAL & MGMT 02/24/2016 Corrie Mckusick, DO [facility] INTERV RAD  . IR GENERIC HISTORICAL  03/23/2016   IR RADIOLOGIST EVAL & MGMT 03/23/2016 Ardis Rowan, PA-C [facility] INTERV RAD  . IR GENERIC HISTORICAL  04/06/2016   IR RADIOLOGIST EVAL & MGMT 04/06/2016 Darrell K Allred, PA-C [facility] INTERV RAD  . IR GENERIC HISTORICAL  04/20/2016   IR RADIOLOGIST EVAL & MGMT 04/20/2016 Greggory Keen, MD [facility] INTERV RAD  . IR RADIOLOGIST EVAL & MGMT  05/23/2016  . IR RADIOLOGIST EVAL & MGMT  05/03/2016  . KIDNEY TRANSPLANT  1984  . left arm surgery     due to injury, two plates in place   . PARATHYROIDECTOMY  11/04/2010   left inferior parathyroid   OB History    No data available     Home Medications    Prior to Admission medications   Medication Sig Start Date End Date Taking? Authorizing Provider  apixaban (ELIQUIS) 5 MG TABS tablet Take 1 tablet (5 mg total) 2 (two) times daily by mouth. 12/28/16  Yes Jettie Booze, MD  atorvastatin (LIPITOR) 20 MG tablet Take 20 mg by mouth daily.    Yes [provider]  azaTHIOprine (IMURAN) 50 MG tablet Take 50 mg by mouth daily.    Yes [provider]  cycloSPORINE (RESTASIS) 0.05 % ophthalmic emulsion Place 1 drop into both eyes 2 (two) times daily.      Yes [provider]  HYDROcodone-acetaminophen (NORCO) 10-325 MG per tablet Take 1 tablet by mouth every 6 (six) hours as needed (pain).    Yes [provider]  metoprolol tartrate (LOPRESSOR) 50 MG tablet Take 2 tablets (100 mg total) 2 (two) times daily by mouth. 12/28/16  Yes Jettie Booze, MD  omeprazole (PRILOSEC) 40 MG capsule Take 40 mg 2 (two) times daily by mouth.    Yes [provider]  OXYGEN Inhale 1 L into the lungs See admin instructions. Use at  bedtime and as needed during activity   Yes [provider]  predniSONE (DELTASONE) 5 MG tablet Take 7.5 mg by mouth daily with breakfast.  05/04/15  Yes [provider]  QUEtiapine (SEROQUEL) 200 MG tablet Take 200 mg by mouth at bedtime.    Yes [provider]  trimethoprim (TRIMPEX) 100 MG tablet Take 1 tablet (100 mg total) by mouth at bedtime. continuous 01/06/16  Yes Stark Klein, MD  amLODipine (NORVASC) 5 MG tablet Take 1 tablet (5 mg total) by mouth daily. 07/11/16 10/09/16  Jettie Booze, MD  FLUoxetine (PROZAC) 20 MG capsule Take 60 mg by mouth daily.     [provider]  ondansetron (ZOFRAN) 4 MG tablet Take 1 tablet (4 mg total) by mouth every 8 (eight) hours as needed for nausea or vomiting. Patient not taking: Reported on 08/15/2016 06/02/15   Mauri Pole, MD   Family History Family History  Problem Relation Age of Onset  . Lung cancer Sister   . Heart disease Mother   . Stroke Father   . Aneurysm Brother   . Colon cancer Maternal Aunt 61  . Cancer Unknown   . Esophageal cancer Neg Hx   . Rectal cancer Neg Hx   . Stomach cancer Neg Hx   . Breast cancer Neg Hx    Social History Social History   Tobacco Use  . Smoking status: Former Smoker    Packs/day: 1.00    Years: 5.00    Pack years: 5.00    Types: Cigarettes    Last attempt to quit: 05/30/1989    Years since quitting: 27.6  . Smokeless tobacco: Never Used  Substance Use Topics    . Alcohol use: No    Alcohol/week: 0.0 oz  . Drug use: No   Allergies   Amoxicillin and Tape  Review of Systems Review of Systems  Constitutional: Negative for chills and fever.  Respiratory: Positive for shortness of breath. Negative for cough, chest tightness and wheezing.   Cardiovascular: Positive for palpitations. Negative for chest pain and leg swelling.  Gastrointestinal: Negative for abdominal pain, nausea and vomiting.  Skin: Negative for rash.  Neurological: Positive for weakness and light-headedness. Negative for speech difficulty.  Psychiatric/Behavioral: Negative for confusion.   Physical Exam Updated Vital Signs BP 127/87   Pulse (!) 114   Temp 98 F (36.7 C) (Oral)   Resp (!) 24   SpO2 94%   Physical Exam Gen-71 year old female, on nonrebreather, no acute distress Skin -normal color and turgor HEENT-NCAT, EOMI, PERRLA, moist mucous membranes, oropharynx clear Neck - supple  Chest -good air movement, diminished sound over right lung base, no crackles or wheeze Heart -irregular rate in A. fib Abdomen - soft,  nontender, nondistended, positive bowel sounds Musculoskeletal -no edema present Neuro -alert, oriented x3, no focal deficits, strength is 5 out of 5 bilaterally in upper and lower extremities, normal sensation  ED Treatments / Results  Labs (all labs ordered are listed, but only abnormal results are displayed) Labs Reviewed  CBC WITH DIFFERENTIAL/PLATELET - Abnormal; Notable for the following components:      Result Value   RBC 3.68 (*)    Hemoglobin 11.3 (*)    HCT 34.7 (*)    Monocytes Absolute 1.2 (*)    All other components within normal limits  COMPREHENSIVE METABOLIC PANEL - Abnormal; Notable for the following components:   Glucose, Bld 182 (*)    Calcium 8.6 (*)    Total Protein 6.4 (*)  Albumin 3.3 (*)    GFR calc non Af Amer 56 (*)    All other components within normal limits  BRAIN NATRIURETIC PEPTIDE - Abnormal; Notable for the  following components:   B Natriuretic Peptide 2,077.8 (*)    All other components within normal limits  PROTIME-INR - Abnormal; Notable for the following components:   Prothrombin Time 18.8 (*)    All other components within normal limits  CULTURE, BLOOD (ROUTINE X 2)  CULTURE, BLOOD (ROUTINE X 2)  CULTURE, EXPECTORATED SPUTUM-ASSESSMENT  GRAM STAIN  TSH  STREP PNEUMONIAE URINARY ANTIGEN  INFLUENZA PANEL BY PCR (TYPE A & B)  HIV ANTIBODY (ROUTINE TESTING)  LEGIONELLA PNEUMOPHILA SEROGP 1 UR AG  I-STAT TROPONIN, ED  I-STAT CG4 LACTIC ACID, ED  I-STAT CG4 LACTIC ACID, ED  I-STAT CG4 LACTIC ACID, ED   EKG  EKG Interpretation  Date/Time:  Wednesday January 03 2017 07:06:21 EST Ventricular Rate:  133 PR Interval:    QRS Duration: 86 QT Interval:  336 QTC Calculation: 500 R Axis:   95 Text Interpretation:  Right and left arm electrode reversal, interpretation assumes no reversal Atrial fibrillation Probable lateral infarct, age indeterminate afib new since previous Confirmed by Wandra Arthurs [phone]) on 01/03/2017 7:12:48 AM Also confirmed by Wandra Arthurs [phone]), editor Hattie Perch ([id number])  on 01/03/2017 7:39:25 AM      Radiology Ct Angio Chest Pe W And/or Wo Contrast  Result Date: 01/03/2017 CLINICAL DATA:  CVA, hypertension, renal transplant EXAM: CT ANGIOGRAPHY CHEST WITH CONTRAST TECHNIQUE: Multidetector CT imaging of the chest was performed using the standard protocol during bolus administration of intravenous contrast. Multiplanar CT image reconstructions and MIPs were obtained to evaluate the vascular anatomy. CONTRAST:  <See Chart> ISOVUE-370 IOPAMIDOL (ISOVUE-370) INJECTION 76% COMPARISON:  None. FINDINGS: Cardiovascular: No filling defects within the pulmonary arteries arteries to suggest acute pulmonary embolism. No acute findings of the aorta. Calcification of the aorta and the coronary arteries. No pericardial effusion Mediastinum/Nodes: No axillary or  supraclavicular adenopathy. No mediastinal hilar adenopathy. Lungs/Pleura: There is RIGHT upper lobe airspace disease superimposed on centrilobular emphysema. Mild airspace disease in the RIGHT lower lobe and small RIGHT effusion. Upper Abdomen: Limited view of the liver, kidneys, pancreas are unremarkable. Normal adrenal glands. Musculoskeletal: No aggressive osseous lesion. Remote sternal fracture. Review of the MIP images confirms the above findings. IMPRESSION: 1. No evidence acute pulmonary embolism. 2. Diffuse airspace disease in the RIGHT upper lobe superimposed on centrilobular emphysema. Etiology includes pulmonary edema versus pneumonia. 3.  Coronary artery calcification Aortic Atherosclerosis (ICD10-I70.0) and Emphysema (ICD10-J43.9). Electronically Signed   By: Suzy Bouchard M.D.   On: 01/03/2017 09:39   Dg Chest Port 1 View  Result Date: 01/03/2017 CLINICAL DATA:  Shortness of breath. Weakness for 2 weeks. Dry cough. EXAM: PORTABLE CHEST 1 VIEW COMPARISON:  Two-view chest x-ray a 02/17/2016. FINDINGS: The heart is enlarged. Aortic atherosclerosis is again seen. Right upper and left lower lobe airspace disease is superimposed on chronic interstitial coarsening. Pleural effusions are not excluded. The visualized soft tissues and bony thorax are unremarkable. IMPRESSION: 1. Right upper lobe and left lower lobe pneumonia superimposed on chronic disease. 2. Cardiomegaly with pulmonary vascular congestion and probable effusions. An element of congestive heart failure is likely also present. 3. Aortic atherosclerosis. Electronically Signed   By: San Morelle M.D.   On: 01/03/2017 07:29   Procedures Procedures (including critical care time)  Medications Ordered in ED Medications  apixaban (ELIQUIS) tablet 5 mg (not  administered)  atorvastatin (LIPITOR) tablet 20 mg (not administered)  azaTHIOprine (IMURAN) tablet 50 mg (not administered)  cycloSPORINE (RESTASIS) 0.05 % ophthalmic  emulsion 1 drop (not administered)  HYDROcodone-acetaminophen (NORCO) 10-325 MG per tablet 1 tablet (not administered)  metoprolol tartrate (LOPRESSOR) tablet 100 mg (100 mg Oral Given 01/03/17 1205)  pantoprazole (PROTONIX) EC tablet 40 mg (40 mg Oral Given 01/03/17 1205)  ondansetron (ZOFRAN) tablet 4 mg (not administered)  predniSONE (DELTASONE) tablet 7.5 mg (not administered)  QUEtiapine (SEROQUEL) tablet 200 mg (not administered)  acetaminophen (TYLENOL) tablet 650 mg (not administered)  ondansetron (ZOFRAN) injection 4 mg (not administered)  cefTRIAXone (ROCEPHIN) 1 g in dextrose 5 % 50 mL IVPB (1 g Intravenous New Bag/Given 01/03/17 1229)  azithromycin (ZITHROMAX) 500 mg in dextrose 5 % 250 mL IVPB (500 mg Intravenous New Bag/Given 01/03/17 1229)  diltiazem (CARDIZEM) 100 mg in dextrose 5 % 100 mL (1 mg/mL) infusion (5 mg/hr Intravenous Not Given 01/03/17 1159)  ALPRAZolam (XANAX) tablet 0.25 mg (not administered)  ipratropium-albuterol (DUONEB) 0.5-2.5 (3) MG/3ML nebulizer solution 3 mL (not administered)  vancomycin (VANCOCIN) 500 mg in sodium chloride 0.9 % 100 mL IVPB (not administered)  diltiazem (CARDIZEM) injection 10 mg (10 mg Intravenous Given 01/03/17 0748)  iopamidol (ISOVUE-370) 76 % injection (  Contrast Given 01/03/17 0815)  furosemide (LASIX) injection 40 mg (40 mg Intravenous Given 01/03/17 0906)  diltiazem (CARDIZEM) 100 mg in dextrose 5 % 100 mL (1 mg/mL) infusion (10 mg/hr Intravenous Rate/Dose Change 01/03/17 1204)  vancomycin (VANCOCIN) IVPB 1000 mg/200 mL premix (1,000 mg Intravenous New Bag/Given 01/03/17 1216)   Initial Impression / Assessment and Plan / ED Course  I have reviewed the triage vital signs and the nursing notes.  Pertinent labs & imaging results that were available during my care of the patient were reviewed by me and considered in my medical decision making (see chart for details).  71 year old female presents with shortness of breath and  feeling weak.  Was recently diagnosed with new onset A. fib.  In ED hypoxic and placed on nonrebreather to stabilize O2 sats <97%.  Troponin within normal limits and EKG showing atrial fibrillation.  Given Cardizem for tachycardia which improved heart rate minimally.  She was started on Cardizem gtt.   Labs notable for elevated BNP of 2077 but otherwise unremarkable.   Given her new onset atrial fibrillation in setting of hypoxia, high clinical suspicion for PE.  Wells criteria 1.5.  CTA ordered to rule out PE which did not show evidence of PE.  Physical exam is unremarkable for obvious signs of fluid overload on exam.  Most recent echo from 2016 with EF of 55-60%.  Given chest x-ray finding of cardiomegaly with pulmonary vascular congestion and probable effusion, could consider CHF exacerbation.  Additionally, CXR with RUL and LLL pneumonia superimposed on chronic disease.  Lactic acid and Bcx pending.  Started on empiric antibiotics with Levaquin due to Amoxicillin allergy.  Could consider underlying thyroid condition as possible etiology.  TSH pending.   -Will call hospitalist for admission   Final Clinical Impressions(s) / ED Diagnoses   Final diagnoses:  Atrial fibrillation, unspecified type [facility])  Shortness of breath   ED Discharge Orders    None     Lovenia Kim, MD [place], PGY-2     Lovenia Kim, MD 01/03/17 1322    Drenda Freeze, MD 01/05/17 [phone]

## 2017-01-03 NOTE — ED Notes (Signed)
Patient given coffee with creamer and sweet n low.

## 2017-01-03 NOTE — Telephone Encounter (Signed)
Called and spoke to [facility] (DPR on file) per MyChart message request. Patient is currently in the hospital for SOB and is being admitted. She states that they think she has pneumonia. She wanted to make Dr. Irish Lack aware. Will forward information.

## 2017-01-03 NOTE — Consult Note (Signed)
Cardiology Consultation:   Patient ID: Morgan Cooper; [id number]; Sep 17, 1945   Admit date: 01/03/2017 Date of Consult: 01/03/2017  Primary Care Provider: Leeroy Cha, MD Primary Cardiologist: Dr. Irish Lack    Patient Profile:   Morgan Cooper is a 71 y.o. 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 Pt is being seen today for the evaluation of rapid afib and chest pain, at the request of Dr. Manuella Ghazi, Internal Medicine.  History of Present Illness:   Pt is followed by Dr. Irish Lack. She has known diastolic dysfunction. Last echo previous to this admit was in 2016 and showed G2DD, normal LVEF and trivial AI as well as severely dilated LA. EF at that time was 55-60%. No h/o ischemic heart disease. She had a lexiscan NST in 2016 that was low risk for ischemia.   Most recently, she called the office complaining of increased HR and palpitations. Dr. Irish Lack ordered a 30 day monitor which revealed new atrial fibrillation. 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 A f/u appt was arranged for her to further discuss management with Dr. Irish Lack on 01/04/17, which is tomorrow.   Earlier today, pt presented to the ED with complaint of dyspnea on exertion and dull substernal chest pain. In the ED, she was noted to be tachycardic and hypoxic w/  O2 sats in the 80s and she was started on nonrebreather mask. EKG showed atrial fibrillation w/ RVR in the 130s and she was placed on IV Cardizem drip with bolus for rate control. BNP markedly elevated at 2,077. CXR c/w CHF with pulmonary vascular congestion and cardiomegaly.  Chest CT was also performed to rule out PE. This was negative for PE but demonstrated a RUL PNA (also detected on CXR). Pt has been started on antibiotics and admitted by IM to stepdown unit. Sputum culture, gram stain and respiratory panels pending. 2D echo has been ordered, results pending.  POC troponin is negative. TSH is WNL. WBC is WNL and she is afebrile. She has mild anemia with Hgb at 11.3. BMP with normal renal function. SCr 0.99. BUN 10. K is WNL at 4.3. Mild hypocalcemia at 8.6. EKG show Afib w/ RVR in the 130s. BP is stable. Of note, chest CT also demonstrated aortic and coronary artery calcifications.     Past Medical History:  Diagnosis Date  . Anxiety   . Arthritis    hips, legs, hand   . Barrett's esophagus   . Bone pain   . Bruises easily   . Cancer ([place])    skin cancer that has been removed- L hand  . CHF (congestive heart failure) ([place])   . Chills   . CVA (cerebral infarction)    [place] Strokes   . Depression   . Disturbed concentration   . Diverticulitis of large intestine with perforation Hx colectomy  . Esophageal stricture   . Fatigue   . Forgetfulness   . Gastroparesis   . Generalized headaches   . GERD (gastroesophageal reflux disease)   . Heart failure, diastolic, chronic ([place])   . History of hiatal hernia   . Hx of cardiovascular stress test  Lexiscan Myoview (06/2013):  No ischemia, EF 72%; Low Risk  . Hyperlipemia   . Hypertension   . Irritable   . Itch    skin  . Keratosis   . Macular degeneration    left eye  . Nausea    little vomitting  . Osteoporosis   . Pancreatic cyst   . Pancreatic mass   . Panic attacks   . Persistent headaches   . Pulmonary hypertension ([place])   . Rapid heart rate    some times  . Renal failure    transplant - 1983,  followed by Dr. Justin Mend   . Sleep apnea    uses at bedtime- 2liters/min , negative- apnea., last study- 2/017  . Sleep difficulties   . Stroke ([place])   . TIA (transient ischemic attack)     Past  Surgical History:  Procedure Laterality Date  . APPENDECTOMY    . cataract surgery bilateral    . COLON RESECTION  2010   Colostomy  . COLOSTOMY  2011   Reversal   . colostomy reversal    . ESOPHAGEAL DILATION    . IR GENERIC HISTORICAL  01/06/2016   IR RADIOLOGIST EVAL & MGMT 01/06/2016 Greggory Keen, MD [facility] INTERV RAD  . IR GENERIC HISTORICAL  01/20/2016   IR RADIOLOGIST EVAL & MGMT 01/20/2016 Sandi Mariscal, MD [facility] INTERV RAD  . IR GENERIC HISTORICAL  02/03/2016   IR RADIOLOGIST EVAL & MGMT 02/03/2016 Saverio Danker, PA-C [facility] INTERV RAD  . IR GENERIC HISTORICAL  02/24/2016   IR RADIOLOGIST EVAL & MGMT 02/24/2016 Corrie Mckusick, DO [facility] INTERV RAD  . IR GENERIC HISTORICAL  03/23/2016   IR RADIOLOGIST EVAL & MGMT 03/23/2016 Ardis Rowan, PA-C [facility] INTERV RAD  . IR GENERIC HISTORICAL  04/06/2016   IR RADIOLOGIST EVAL & MGMT 04/06/2016 Darrell K Allred, PA-C [facility] INTERV RAD  . IR GENERIC HISTORICAL  04/20/2016   IR RADIOLOGIST EVAL & MGMT 04/20/2016 Greggory Keen, MD [facility] INTERV RAD  . IR RADIOLOGIST EVAL & MGMT  05/23/2016  . IR RADIOLOGIST EVAL & MGMT  05/03/2016  . KIDNEY TRANSPLANT  1984  . left arm surgery     due to injury, two plates in place   . PARATHYROIDECTOMY  11/04/2010   left inferior parathyroid     Home Medications:  Prior to Admission medications   Medication Sig Start Date End Date Taking? Authorizing Provider  apixaban (ELIQUIS) 5 MG TABS tablet Take 1 tablet (5 mg total) 2 (two) times daily by mouth. 12/28/16  Yes Jettie Booze, MD  atorvastatin (LIPITOR) 20 MG tablet Take 20 mg by mouth daily.    Yes [provider]  azaTHIOprine (IMURAN) 50 MG tablet Take 50 mg by mouth daily.    Yes [provider]  cycloSPORINE (RESTASIS) 0.05 % ophthalmic emulsion Place 1 drop into both eyes 2 (two) times daily.     Yes [provider]  HYDROcodone-acetaminophen (NORCO) 10-325 MG per tablet Take 1 tablet by mouth every 6 (six) hours as  needed (pain).    Yes [provider]  metoprolol tartrate (LOPRESSOR) 50 MG tablet Take 2 tablets (100 mg total) 2 (two) times daily by mouth. 12/28/16  Yes Jettie Booze, MD  omeprazole (PRILOSEC) 40 MG capsule Take 40 mg 2 (two) times daily by mouth.    Yes [provider]  OXYGEN Inhale 1 L into the lungs See admin instructions. Use at bedtime and as needed during activity  Yes [provider]  predniSONE (DELTASONE) 5 MG tablet Take 7.5 mg by mouth daily with breakfast.  05/04/15  Yes [provider]  QUEtiapine (SEROQUEL) 200 MG tablet Take 200 mg by mouth at bedtime.    Yes [provider]  trimethoprim (TRIMPEX) 100 MG tablet Take 1 tablet (100 mg total) by mouth at bedtime. continuous 01/06/16  Yes Stark Klein, MD  amLODipine (NORVASC) 5 MG tablet Take 1 tablet (5 mg total) by mouth daily. 07/11/16 10/09/16  Jettie Booze, MD  FLUoxetine (PROZAC) 20 MG capsule Take 60 mg by mouth daily.     [provider]  ondansetron (ZOFRAN) 4 MG tablet Take 1 tablet (4 mg total) by mouth every 8 (eight) hours as needed for nausea or vomiting. Patient not taking: Reported on 08/15/2016 06/02/15   Mauri Pole, MD    Inpatient Medications: Scheduled Meds: . apixaban  5 mg Oral BID  . atorvastatin  20 mg Oral Daily  . azaTHIOprine  50 mg Oral Daily  . cycloSPORINE  1 drop Both Eyes BID  . FLUoxetine  60 mg Oral Daily  . ipratropium-albuterol  3 mL Nebulization Q6H  . metoprolol tartrate  100 mg Oral BID  . pantoprazole  40 mg Oral Daily  . [START ON 01/04/2017] predniSONE  7.5 mg Oral Q breakfast  . QUEtiapine  200 mg Oral QHS   Continuous Infusions: . azithromycin    . cefTRIAXone (ROCEPHIN)  IV    . diltiazem (CARDIZEM) infusion     PRN Meds: acetaminophen, ALPRAZolam, HYDROcodone-acetaminophen, ondansetron (ZOFRAN) IV, ondansetron  Allergies:    Allergies  Allergen Reactions  . Amoxicillin Diarrhea    Has  patient had a PCN reaction causing immediate rash, facial/tongue/throat swelling, SOB or lightheadedness with hypotension: No Has patient had a PCN reaction causing severe rash involving mucus membranes or skin necrosis: No Has patient had a PCN reaction that required hospitalization No Has patient had a PCN reaction occurring within the last 10 years: Yes If all of the above answers are "NO", then may proceed with Cephalosporin use.   . Tape Rash    Social History:   Social History   Socioeconomic History  . Marital status: Widowed    Spouse name: Not on file  . Number of children: 2  . Years of education: 28  . Highest education level: Not on file  Social Needs  . Financial resource strain: Not on file  . Food insecurity - worry: Not on file  . Food insecurity - inability: Not on file  . Transportation needs - medical: Not on file  . Transportation needs - non-medical: Not on file  Occupational History  . Occupation: Retired  Tobacco Use  . Smoking status: Former Smoker    Packs/day: 1.00    Years: 5.00    Pack years: 5.00    Types: Cigarettes    Last attempt to quit: 05/30/1989    Years since quitting: 27.6  . Smokeless tobacco: Never Used  Substance and Sexual Activity  . Alcohol use: No    Alcohol/week: 0.0 oz  . Drug use: No  . Sexual activity: Not on file  Other Topics Concern  . Not on file  Social History Narrative   Patient is widowed with 2 children   Patient is right handed   Patient has a high school education   Patient drinks 3 cups daily       Family History:    Family History  Problem  Relation Age of Onset  . Lung cancer Sister   . Heart disease Mother   . Stroke Father   . Aneurysm Brother   . Colon cancer Maternal Aunt 60  . Cancer Unknown   . Esophageal cancer Neg Hx   . Rectal cancer Neg Hx   . Stomach cancer Neg Hx   . Breast cancer Neg Hx      ROS:  Please see the history of present illness.  ROS  All other ROS reviewed and  negative.     Physical Exam/Data:   Vitals:   01/03/17 0930 01/03/17 0945 01/03/17 1030 01/03/17 1100  BP: (!) 131/107 122/69 120/89 (!) 132/97  Pulse: 86 (!) 102 78 95  Resp: (!) 24 (!) 36 (!) 28 (!) 28  Temp:      TempSrc:      SpO2: 99% 92% 91% 97%   No intake or output data in the 24 hours ending 01/03/17 1130 There were no vitals filed for this visit. There is no height or weight on file to calculate BMI.  General: mildly labored breathing, now with nonrebreathing, frail appearing Lymph: no adenopathy Neck: mild JVD Endocrine:  No thryomegaly Vascular: No carotid bruits; FA pulses 2+ bilaterally without bruits  Cardiac:  irregularly irregular, tachy rate no murmur  Lungs: faint bibasilar crackles Abd: soft, nontender, no hepatomegaly  Ext: thin extremities, no edema Musculoskeletal:  No deformities, BUE and BLE strength normal and equal Skin: warm and dry  Neuro:  CNs 2-12 intact, no focal abnormalities noted Psych:  Normal affect   EKG:  The EKG was personally reviewed and demonstrates:  Atrial fibrillation in the 130s Telemetry:  Telemetry was personally reviewed and demonstrates:  Atrial fibrillation in the 110s  Relevant CV Studies: 2D echo pending   Laboratory Data:  Chemistry Recent Labs  Lab 01/03/17 0708  NA 135  K 4.3  CL 104  CO2 22  GLUCOSE 182*  BUN 10  CREATININE 0.99  CALCIUM 8.6*  GFRNONAA 56*  GFRAA >60  ANIONGAP 9    Recent Labs  Lab 01/03/17 0708  PROT 6.4*  ALBUMIN 3.3*  AST 27  ALT 18  ALKPHOS 65  BILITOT 1.1   Hematology Recent Labs  Lab 01/03/17 0708  WBC 9.0  RBC 3.68*  HGB 11.3*  HCT 34.7*  MCV 94.3  MCH 30.7  MCHC 32.6  RDW 14.8  PLT 197   Cardiac EnzymesNo results for input(s): TROPONINI in the last 168 hours.  Recent Labs  Lab 01/03/17 0717  TROPIPOC 0.03    BNP Recent Labs  Lab 01/03/17 0708  BNP 2,077.8*    DDimer No results for input(s): DDIMER in the last 168 hours.  Radiology/Studies:  Ct  Angio Chest Pe W And/or Wo Contrast  Result Date: 01/03/2017 CLINICAL DATA:  CVA, hypertension, renal transplant EXAM: CT ANGIOGRAPHY CHEST WITH CONTRAST TECHNIQUE: Multidetector CT imaging of the chest was performed using the standard protocol during bolus administration of intravenous contrast. Multiplanar CT image reconstructions and MIPs were obtained to evaluate the vascular anatomy. CONTRAST:  <See Chart> ISOVUE-370 IOPAMIDOL (ISOVUE-370) INJECTION 76% COMPARISON:  None. FINDINGS: Cardiovascular: No filling defects within the pulmonary arteries arteries to suggest acute pulmonary embolism. No acute findings of the aorta. Calcification of the aorta and the coronary arteries. No pericardial effusion Mediastinum/Nodes: No axillary or supraclavicular adenopathy. No mediastinal hilar adenopathy. Lungs/Pleura: There is RIGHT upper lobe airspace disease superimposed on centrilobular emphysema. Mild airspace disease in the RIGHT lower lobe and  small RIGHT effusion. Upper Abdomen: Limited view of the liver, kidneys, pancreas are unremarkable. Normal adrenal glands. Musculoskeletal: No aggressive osseous lesion. Remote sternal fracture. Review of the MIP images confirms the above findings. IMPRESSION: 1. No evidence acute pulmonary embolism. 2. Diffuse airspace disease in the RIGHT upper lobe superimposed on centrilobular emphysema. Etiology includes pulmonary edema versus pneumonia. 3.  Coronary artery calcification Aortic Atherosclerosis (ICD10-I70.0) and Emphysema (ICD10-J43.9). Electronically Signed   By: Suzy Bouchard M.D.   On: 01/03/2017 09:39   Dg Chest Port 1 View  Result Date: 01/03/2017 CLINICAL DATA:  Shortness of breath. Weakness for 2 weeks. Dry cough. EXAM: PORTABLE CHEST 1 VIEW COMPARISON:  Two-view chest x-ray a 02/17/2016. FINDINGS: The heart is enlarged. Aortic atherosclerosis is again seen. Right upper and left lower lobe airspace disease is superimposed on chronic interstitial coarsening.  Pleural effusions are not excluded. The visualized soft tissues and bony thorax are unremarkable. IMPRESSION: 1. Right upper lobe and left lower lobe pneumonia superimposed on chronic disease. 2. Cardiomegaly with pulmonary vascular congestion and probable effusions. An element of congestive heart failure is likely also present. 3. Aortic atherosclerosis. Electronically Signed   By: San Morelle M.D.   On: 01/03/2017 07:29    Assessment and Plan:   1. Acute Hypoxic Respiratory Failure: Multifactorial, secondary to PNA, CHF and afib w/ RVR. Respiratory panel also pending. Initial O2 sats on arrival in the 80s. Improved with supplemental O2. Continue management of underlying conditions. Antibiotics for PNA, Diuretics for CHF and rate control for afib. Other supportive care>>PRN bronchodilators also ordered.   2. RUL PNA: antibiotics per IM.   3. Atrial Fibrillation w/ RVR: recently diagnosed with outpatient monitor. TSH and K are WNL. Suspect her PNA and CHF are contributing to her elevated Vrates. Continue to treat PNA and CHF. Continue Cardizem and metoprolol for rate control. If no improvement with rate control strategy, she may need TEE/DCCV. However, previous echo in 2016 showed severely dilated LA, so maintaining NSR may be challenging. CHA2DS2 VASc score is at least 7 for CHF, HTN, age 26-74, h/o CVA (2), Female Sex and vascular disease. Her risk of stroke is high. This patients CHA2DS2-VASc Score and unadjusted Ischemic Stroke Rate (% per year) is equal to 11.2 % stroke rate/year from a score of 7. Continue Eliquis, 5 mg BID, for stroke risk reduction.   4. Acute CHF: BNP markedly elevated at 2,077. CXR c/w with CHF. Lasix IV ordered. Track I/Os and daily weights. Monitor renal function and electrolytes as well as BP. 2D echo pending to reassess LVEF.   5. Chest Pain: CP may be secondary to rapid afib + PNA, however she has evidence of coronary artery calcifications on chest CT. Once she  has improved from her PNA and once we are able to better control HR, recommend repeat ischemic testing. She has had negative stress test in the past. If able to get HR low enough, may consider coronary CTA to better define her anatomy and r/o obstructive CAD as the cause of her pain and new atrial arrhthymia. Cycle cardiac enzymes to r/o ACS. Continue BB and statin therapy.   MD to follow with further recs.    For questions or updates, please contact [place] Please consult [email or web address] for contact info under Cardiology/STEMI.   Signed, Lyda Jester, PA-C  01/03/2017 11:30 AM

## 2017-01-03 NOTE — H&P (Addendum)
History and Physical    Morgan Cooper DZH:[id number] DOB: [birth year] DOA: 01/03/2017  PCP: Leeroy Cha, MD   Patient coming from: Home  Chief Complaint: Dyspnea and near-syncope  HPI: Morgan Cooper is a 71 y.o. female with medical history significant of prior renal transplant on immunosuppressive agents, and recent worsening of dyspnea over the last 2 weeks with recent confirmation of new onset atrial fibrillation on Holter monitoring presents to the emergency department via EMS with significant worsening of her shortness of breath this morning along with some associated dull, achy, substernal chest pain.  The pain does not radiate, nor is it associated with activity.  She appears to deny any specific aggravating or alleviating factors.  She has recently been placed on metoprolol as well as Eliquis by her cardiologist as a result of her new onset atrial fibrillation.  She denies any cough, fevers, or chills.  She did become lightheaded in the bathroom earlier this morning which is also part of the reason she is here.  She denies any loss of consciousness or fall.  EMS had actually noted an O2 saturation of 76% at home, which subsequently improved to 88% on 2 L nasal cannula. Of note, patient does have a history of chronic diastolic congestive heart failure and most recently echocardiogram in 2016 demonstrates EF of 55-60%.  ED Course: In the emergency department, she was noted to be in atrial fibrillation with RVR for which she was given a Cardizem IV bolus and was started on a drip.  She had a BNP of 2077, but does not clinically appear to be volume overloaded.  She was also noted to be hypoxemic for which she was placed on a nonrebreather mask.  Her cardiac enzymes were within normal limits and EKG demonstrated the atrial fibrillation with RVR.  A CT angiogram of the chest was performed with no findings of a PE, however there were findings notable for a right upper lobe pneumonia.  Given  that she recently developed new onset atrial fibrillation, cardiology was consulted for further evaluation and management by the ED physician.  She was started on IV Levaquin due to penicillin allergy, however the patient confirms that she does not truly have any penicillin allergy.  Lactic acid was noted to be 1.45 and WBC count is noted to be 9000.  Vital signs are currently stable.  Review of Systems: As per HPI otherwise 10 point review of systems negative.    Past Medical History:  Diagnosis Date  . Anxiety   . Arthritis    hips, legs, hand   . Barrett's esophagus   . Bone pain   . Bruises easily   . Cancer ([place])    skin cancer that has been removed- L hand  . CHF (congestive heart failure) ([place])   . Chills   . CVA (cerebral infarction)    Mini Strokes   . Depression   . Disturbed concentration   . Diverticulitis of large intestine with perforation Hx colectomy  . Esophageal stricture   . Fatigue   . Forgetfulness   . Gastroparesis   . Generalized headaches   . GERD (gastroesophageal reflux disease)   . Heart failure, diastolic, chronic ([place])   . History of hiatal hernia   . Hx of cardiovascular stress test    Lexiscan Myoview (06/2013):  No ischemia, EF 72%; Low Risk  . Hyperlipemia   . Hypertension   . Irritable   . Itch    skin  . Keratosis   .  Macular degeneration    left eye  . Nausea    little vomitting  . Osteoporosis   . Pancreatic cyst   . Pancreatic mass   . Panic attacks   . Persistent headaches   . Pulmonary hypertension ([place])   . Rapid heart rate    some times  . Renal failure    transplant - 1983,  followed by Dr. Justin Mend   . Sleep apnea    uses at bedtime- 2liters/min , negative- apnea., last study- 2/017  . Sleep difficulties   . Stroke ([place])   . TIA (transient ischemic attack)     Past Surgical History:  Procedure Laterality Date  . APPENDECTOMY    . cataract surgery bilateral    . COLON RESECTION  2010   Colostomy  . COLOSTOMY  2011     Reversal   . colostomy reversal    . ESOPHAGEAL DILATION    . IR GENERIC HISTORICAL  01/06/2016   IR RADIOLOGIST EVAL & MGMT 01/06/2016 Greggory Keen, MD [facility] INTERV RAD  . IR GENERIC HISTORICAL  01/20/2016   IR RADIOLOGIST EVAL & MGMT 01/20/2016 Sandi Mariscal, MD [facility] INTERV RAD  . IR GENERIC HISTORICAL  02/03/2016   IR RADIOLOGIST EVAL & MGMT 02/03/2016 Saverio Danker, PA-C [facility] INTERV RAD  . IR GENERIC HISTORICAL  02/24/2016   IR RADIOLOGIST EVAL & MGMT 02/24/2016 Corrie Mckusick, DO [facility] INTERV RAD  . IR GENERIC HISTORICAL  03/23/2016   IR RADIOLOGIST EVAL & MGMT 03/23/2016 Ardis Rowan, PA-C [facility] INTERV RAD  . IR GENERIC HISTORICAL  04/06/2016   IR RADIOLOGIST EVAL & MGMT 04/06/2016 Darrell K Allred, PA-C [facility] INTERV RAD  . IR GENERIC HISTORICAL  04/20/2016   IR RADIOLOGIST EVAL & MGMT 04/20/2016 Greggory Keen, MD [facility] INTERV RAD  . IR RADIOLOGIST EVAL & MGMT  05/23/2016  . IR RADIOLOGIST EVAL & MGMT  05/03/2016  . KIDNEY TRANSPLANT  1984  . left arm surgery     due to injury, two plates in place   . PARATHYROIDECTOMY  11/04/2010   left inferior parathyroid     reports that she quit smoking about 27 years ago. Her smoking use included cigarettes. She has a 5.00 pack-year smoking history. she has never used smokeless tobacco. She reports that she does not drink alcohol or use drugs.  Allergies  Allergen Reactions  . Amoxicillin Diarrhea    Has patient had a PCN reaction causing immediate rash, facial/tongue/throat swelling, SOB or lightheadedness with hypotension: No Has patient had a PCN reaction causing severe rash involving mucus membranes or skin necrosis: No Has patient had a PCN reaction that required hospitalization No Has patient had a PCN reaction occurring within the last 10 years: Yes If all of the above answers are "NO", then may proceed with Cephalosporin use.   . Tape Rash    Family History  Problem Relation Age of Onset  . Lung cancer Sister   . Heart  disease Mother   . Stroke Father   . Aneurysm Brother   . Colon cancer Maternal Aunt 29  . Cancer Unknown   . Esophageal cancer Neg Hx   . Rectal cancer Neg Hx   . Stomach cancer Neg Hx   . Breast cancer Neg Hx     Prior to Admission medications   Medication Sig Start Date End Date Taking? Authorizing Provider  apixaban (ELIQUIS) 5 MG TABS tablet Take 1 tablet (5 mg total) 2 (two) times daily by mouth. 12/28/16  Yes Jettie Booze, MD  atorvastatin (LIPITOR) 20 MG tablet Take 20 mg by mouth daily.    Yes [provider]  azaTHIOprine (IMURAN) 50 MG tablet Take 50 mg by mouth daily.    Yes [provider]  cycloSPORINE (RESTASIS) 0.05 % ophthalmic emulsion Place 1 drop into both eyes 2 (two) times daily.     Yes [provider]  HYDROcodone-acetaminophen (NORCO) 10-325 MG per tablet Take 1 tablet by mouth every 6 (six) hours as needed (pain).    Yes [provider]  metoprolol tartrate (LOPRESSOR) 50 MG tablet Take 2 tablets (100 mg total) 2 (two) times daily by mouth. 12/28/16  Yes Jettie Booze, MD  omeprazole (PRILOSEC) 40 MG capsule Take 40 mg 2 (two) times daily by mouth.    Yes [provider]  OXYGEN Inhale 1 L into the lungs See admin instructions. Use at bedtime and as needed during activity   Yes [provider]  predniSONE (DELTASONE) 5 MG tablet Take 7.5 mg by mouth daily with breakfast.  05/04/15  Yes [provider]  QUEtiapine (SEROQUEL) 200 MG tablet Take 200 mg by mouth at bedtime.    Yes [provider]  trimethoprim (TRIMPEX) 100 MG tablet Take 1 tablet (100 mg total) by mouth at bedtime. continuous 01/06/16  Yes Stark Klein, MD  amLODipine (NORVASC) 5 MG tablet Take 1 tablet (5 mg total) by mouth daily. 07/11/16 10/09/16  Jettie Booze, MD  FLUoxetine (PROZAC) 20 MG capsule Take 60 mg by mouth daily.     [provider]  ondansetron (ZOFRAN) 4 MG tablet Take 1 tablet (4 mg  total) by mouth every 8 (eight) hours as needed for nausea or vomiting. Patient not taking: Reported on 08/15/2016 06/02/15   Mauri Pole, MD    Physical Exam: Vitals:   01/03/17 0912 01/03/17 0915 01/03/17 0930 01/03/17 0945  BP: (!) 139/93 (!) 129/94 (!) 131/107 122/69  Pulse: (!) 122 83 86 (!) 102  Resp: (!) 21 (!) 23 (!) 24 (!) 36  Temp:      TempSrc:      SpO2: 92% 96% 99% 92%    Constitutional: NAD, calm, comfortable; thin and dry/euvolemic Vitals:   01/03/17 0912 01/03/17 0915 01/03/17 0930 01/03/17 0945  BP: (!) 139/93 (!) 129/94 (!) 131/107 122/69  Pulse: (!) 122 83 86 (!) 102  Resp: (!) 21 (!) 23 (!) 24 (!) 36  Temp:      TempSrc:      SpO2: 92% 96% 99% 92%   Eyes: PERRL, lids and conjunctivae normal ENMT: Mucous membranes are moist. Posterior pharynx clear of any exudate or lesions.Normal dentition; on NRB mask Neck: normal, supple, no masses, no thyromegaly Respiratory: clear to auscultation bilaterally, no wheezing, no crackles. Normal respiratory effort. No accessory muscle use.  Cardiovascular: irregular, no murmurs / rubs / gallops. No extremity edema. 2+ pedal pulses. No carotid bruits.  Abdomen: no tenderness, no masses palpated. No hepatosplenomegaly. Bowel sounds positive.  Musculoskeletal: no clubbing / cyanosis. No joint deformity upper and lower extremities. Good ROM, no contractures. Normal muscle tone.  Skin: no rashes, lesions, ulcers. No induration Neurologic: CN 2-12 grossly intact. Sensation intact, DTR normal. Strength 5/5 in all 4.  Psychiatric: Normal judgment and insight. Alert and oriented x 3. Normal mood.    Labs on Admission: I have personally reviewed following labs and imaging studies  CBC: Recent Labs  Lab 01/03/17 0708  WBC 9.0  NEUTROABS 6.4  HGB 11.3*  HCT 34.7*  MCV 94.3  PLT 295   Basic Metabolic Panel: Recent Labs  Lab 01/03/17 0708  NA 135  K 4.3  CL 104  CO2 22  GLUCOSE 182*  BUN 10  CREATININE 0.99    CALCIUM 8.6*   GFR: CrCl cannot be calculated (Unknown ideal weight.). Liver Function Tests: Recent Labs  Lab 01/03/17 0708  AST 27  ALT 18  ALKPHOS 65  BILITOT 1.1  PROT 6.4*  ALBUMIN 3.3*   No results for input(s): LIPASE, AMYLASE in the last 168 hours. No results for input(s): AMMONIA in the last 168 hours. Coagulation Profile: Recent Labs  Lab 01/03/17 0708  INR 1.59   Cardiac Enzymes: No results for input(s): CKTOTAL, CKMB, CKMBINDEX, TROPONINI in the last 168 hours. BNP (last 3 results) No results for input(s): PROBNP in the last 8760 hours. HbA1C: No results for input(s): HGBA1C in the last 72 hours. CBG: No results for input(s): GLUCAP in the last 168 hours. Lipid Profile: No results for input(s): CHOL, HDL, LDLCALC, TRIG, CHOLHDL, LDLDIRECT in the last 72 hours. Thyroid Function Tests: Recent Labs    01/03/17 0729  TSH 1.935   Anemia Panel: No results for input(s): VITAMINB12, FOLATE, FERRITIN, TIBC, IRON, RETICCTPCT in the last 72 hours. Urine analysis:    Component Value Date/Time   COLORURINE YELLOW 12/24/2015 0352   APPEARANCEUR CLEAR 12/24/2015 0352   LABSPEC 1.011 12/24/2015 0352   PHURINE 6.5 12/24/2015 0352   GLUCOSEU NEGATIVE 12/24/2015 0352   HGBUR TRACE (A) 12/24/2015 0352   BILIRUBINUR NEGATIVE 12/24/2015 0352   KETONESUR NEGATIVE 12/24/2015 0352   PROTEINUR NEGATIVE 12/24/2015 0352   UROBILINOGEN 0.2 07/08/2011 2159   NITRITE NEGATIVE 12/24/2015 0352   LEUKOCYTESUR NEGATIVE 12/24/2015 0352    Radiological Exams on Admission: Ct Angio Chest Pe W And/or Wo Contrast  Result Date: 01/03/2017 CLINICAL DATA:  CVA, hypertension, renal transplant EXAM: CT ANGIOGRAPHY CHEST WITH CONTRAST TECHNIQUE: Multidetector CT imaging of the chest was performed using the standard protocol during bolus administration of intravenous contrast. Multiplanar CT image reconstructions and MIPs were obtained to evaluate the vascular anatomy. CONTRAST:  <See  Chart> ISOVUE-370 IOPAMIDOL (ISOVUE-370) INJECTION 76% COMPARISON:  None. FINDINGS: Cardiovascular: No filling defects within the pulmonary arteries arteries to suggest acute pulmonary embolism. No acute findings of the aorta. Calcification of the aorta and the coronary arteries. No pericardial effusion Mediastinum/Nodes: No axillary or supraclavicular adenopathy. No mediastinal hilar adenopathy. Lungs/Pleura: There is RIGHT upper lobe airspace disease superimposed on centrilobular emphysema. Mild airspace disease in the RIGHT lower lobe and small RIGHT effusion. Upper Abdomen: Limited view of the liver, kidneys, pancreas are unremarkable. Normal adrenal glands. Musculoskeletal: No aggressive osseous lesion. Remote sternal fracture. Review of the MIP images confirms the above findings. IMPRESSION: 1. No evidence acute pulmonary embolism. 2. Diffuse airspace disease in the RIGHT upper lobe superimposed on centrilobular emphysema. Etiology includes pulmonary edema versus pneumonia. 3.  Coronary artery calcification Aortic Atherosclerosis (ICD10-I70.0) and Emphysema (ICD10-J43.9). Electronically Signed   By: Suzy Bouchard M.D.   On: 01/03/2017 09:39   Dg Chest Port 1 View  Result Date: 01/03/2017 CLINICAL DATA:  Shortness of breath. Weakness for 2 weeks. Dry cough. EXAM: PORTABLE CHEST 1 VIEW COMPARISON:  Two-view chest x-ray a 02/17/2016. FINDINGS: The heart is enlarged. Aortic atherosclerosis is again seen. Right upper and left lower lobe airspace disease is superimposed on chronic interstitial coarsening. Pleural effusions are not excluded. The visualized soft tissues and bony thorax are unremarkable.  IMPRESSION: 1. Right upper lobe and left lower lobe pneumonia superimposed on chronic disease. 2. Cardiomegaly with pulmonary vascular congestion and probable effusions. An element of congestive heart failure is likely also present. 3. Aortic atherosclerosis. Electronically Signed   By: San Morelle  M.D.   On: 01/03/2017 07:29    Assessment/Plan Principal Problem:   Atrial fibrillation with RVR (HCC) Active Problems:   Chronic diastolic heart failure (HCC)   History of renal transplant   Interstitial lung disease (HCC)   Protein-calorie malnutrition, severe   Community acquired pneumonia   Acute hypoxemic respiratory failure (HCC)    Atrial fibrillation with RVR -new onset -Cardizem drip -Oral metoprolol -Eliquis for anticoagulation  -TSH pending; will order 2D echo; appreciate Cardiology consultation-called by EDP -Place in SDU  RUL Community acquired pneumonia- in immunocompromised patient -Rocephin, Azithromycin, and Vancomycin -Sputum/blood cultures -Strep/Legionella antigens  Acute hypoxemic respiratory failure-secondary to above -Continue NRB mask and wean as tolerated -Duonebs q6 hours  Chronic diastolic heart failure -Euvolemic/dry currently -2D echo  History of renal transplant -Continue home medications   Interstitial lung disease -Duonebs and O2  History of anxiety -Xanax prn -Continue home medications  Protein-calorie malnutrition, severe -Initiate diet and supplementation prn     DVT prophylaxis: Eliquis Code Status: Full Family Communication: Sister "Pat" at bedside Disposition Plan: 48-72 hours after Afib rate controlled and hypoxemia improved with PNA treatment Consults called: Cardiology to [place] Admission status: SDU   Rondall Radigan Darleen Crocker DO [facility] Pager [phone]  If 7PM-7AM, please contact night-coverage [email or web address] Password [facility]  01/03/2017, 10:44 AM

## 2017-01-03 NOTE — ED Notes (Signed)
Nelly Rout (sister) [phone] [name] (daughter) [phone] Aaron Mose (daughter) [phone]

## 2017-01-03 NOTE — ED Notes (Signed)
Patient given water to drink.  

## 2017-01-03 NOTE — ED Notes (Signed)
Patient complaining of being anxious.

## 2017-01-03 NOTE — ED Triage Notes (Signed)
Pt here via [facility]. Called out for sob. Dx with afib 2 weeks ago. New afib meds-pt unsure of name. Denies chest pain. EMS reports O2 sat 756% on RA at home. 2Lnc was 88% and 4Lnc 92%. Pt c/o some nausea. Chronic back pain.

## 2017-01-04 ENCOUNTER — Inpatient Hospital Stay (HOSPITAL_COMMUNITY): Payer: Medicare Other

## 2017-01-04 ENCOUNTER — Encounter (HOSPITAL_COMMUNITY): Payer: Self-pay | Admitting: *Deleted

## 2017-01-04 ENCOUNTER — Encounter: Payer: Self-pay | Admitting: Interventional Cardiology

## 2017-01-04 ENCOUNTER — Other Ambulatory Visit: Payer: Self-pay

## 2017-01-04 ENCOUNTER — Ambulatory Visit: Payer: Medicare Other | Admitting: Interventional Cardiology

## 2017-01-04 DIAGNOSIS — I5032 Chronic diastolic (congestive) heart failure: Secondary | ICD-10-CM

## 2017-01-04 DIAGNOSIS — I4891 Unspecified atrial fibrillation: Secondary | ICD-10-CM

## 2017-01-04 DIAGNOSIS — I361 Nonrheumatic tricuspid (valve) insufficiency: Secondary | ICD-10-CM

## 2017-01-04 LAB — CBC
HEMATOCRIT: 35.7 % — AB (ref 36.0–46.0)
HEMOGLOBIN: 11.6 g/dL — AB (ref 12.0–15.0)
MCH: 31.2 pg (ref 26.0–34.0)
MCHC: 32.5 g/dL (ref 30.0–36.0)
MCV: 96 fL (ref 78.0–100.0)
Platelets: 218 10*3/uL (ref 150–400)
RBC: 3.72 MIL/uL — ABNORMAL LOW (ref 3.87–5.11)
RDW: 14.8 % (ref 11.5–15.5)
WBC: 8.8 10*3/uL (ref 4.0–10.5)

## 2017-01-04 LAB — BASIC METABOLIC PANEL
ANION GAP: 13 (ref 5–15)
BUN: 7 mg/dL (ref 6–20)
CALCIUM: 8.7 mg/dL — AB (ref 8.9–10.3)
CHLORIDE: 99 mmol/L — AB (ref 101–111)
CO2: 23 mmol/L (ref 22–32)
Creatinine, Ser: 0.8 mg/dL (ref 0.44–1.00)
GFR calc Af Amer: >60 mL/min (ref >60)
GFR calc non Af Amer: >60 mL/min (ref >60)
GLUCOSE: 183 mg/dL — AB (ref 65–99)
Potassium: 3.4 mmol/L — ABNORMAL LOW (ref 3.5–5.1)
Sodium: 135 mmol/L (ref 135–145)

## 2017-01-04 LAB — CBC WITH DIFFERENTIAL/PLATELET
BASOS ABS: 0 10*3/uL (ref 0.0–0.1)
BASOS PCT: 0 %
EOS ABS: 0 10*3/uL (ref 0.0–0.7)
Eosinophils Relative: 0 %
HEMATOCRIT: 33.8 % — AB (ref 36.0–46.0)
HEMOGLOBIN: 11.5 g/dL — AB (ref 12.0–15.0)
Lymphocytes Relative: 9 %
Lymphs Abs: 0.9 10*3/uL (ref 0.7–4.0)
MCH: 31.9 pg (ref 26.0–34.0)
MCHC: 34 g/dL (ref 30.0–36.0)
MCV: 93.6 fL (ref 78.0–100.0)
Monocytes Absolute: 1 10*3/uL (ref 0.1–1.0)
Monocytes Relative: 9 %
NEUTROS ABS: 9.1 10*3/uL — AB (ref 1.7–7.7)
NEUTROS PCT: 82 %
Platelets: 166 10*3/uL (ref 150–400)
RBC: 3.61 MIL/uL — ABNORMAL LOW (ref 3.87–5.11)
RDW: 14.8 % (ref 11.5–15.5)
WBC: 11.1 10*3/uL — ABNORMAL HIGH (ref 4.0–10.5)

## 2017-01-04 LAB — BLOOD GAS, ARTERIAL
Acid-Base Excess: 2.1 mmol/L — ABNORMAL HIGH (ref 0.0–2.0)
Bicarbonate: 25.6 mmol/L (ref 20.0–28.0)
DRAWN BY: 511841
Delivery systems: POSITIVE
Expiratory PAP: 6
FIO2: 100
Inspiratory PAP: 12
O2 Saturation: 96.1 %
PATIENT TEMPERATURE: 98.6
PCO2 ART: 35.8 mmHg (ref 32.0–48.0)
PH ART: 7.468 — AB (ref 7.350–7.450)
PO2 ART: 80.4 mmHg — AB (ref 83.0–108.0)
RATE: 8 resp/min

## 2017-01-04 LAB — ECHOCARDIOGRAM COMPLETE
HEIGHTINCHES: 65 in
WEIGHTICAEL: 1784.84 [oz_av]

## 2017-01-04 LAB — COMPREHENSIVE METABOLIC PANEL
ALBUMIN: 3 g/dL — AB (ref 3.5–5.0)
ALK PHOS: 59 U/L (ref 38–126)
ALT: 15 U/L (ref 14–54)
ANION GAP: 9 (ref 5–15)
AST: 29 U/L (ref 15–41)
BILIRUBIN TOTAL: 1.5 mg/dL — AB (ref 0.3–1.2)
BUN: 11 mg/dL (ref 6–20)
CALCIUM: 8.7 mg/dL — AB (ref 8.9–10.3)
CO2: 25 mmol/L (ref 22–32)
Chloride: 102 mmol/L (ref 101–111)
Creatinine, Ser: 0.9 mg/dL (ref 0.44–1.00)
GFR calc Af Amer: >60 mL/min (ref >60)
GFR calc non Af Amer: >60 mL/min (ref >60)
GLUCOSE: 188 mg/dL — AB (ref 65–99)
POTASSIUM: 4.1 mmol/L (ref 3.5–5.1)
SODIUM: 136 mmol/L (ref 135–145)
TOTAL PROTEIN: 6.5 g/dL (ref 6.5–8.1)

## 2017-01-04 LAB — LEGIONELLA PNEUMOPHILA SEROGP 1 UR AG: L. PNEUMOPHILA SEROGP 1 UR AG: NEGATIVE

## 2017-01-04 LAB — GLUCOSE, CAPILLARY: Glucose-Capillary: 197 mg/dL — ABNORMAL HIGH (ref 65–99)

## 2017-01-04 LAB — MRSA PCR SCREENING: MRSA by PCR: NEGATIVE

## 2017-01-04 LAB — HIV ANTIBODY (ROUTINE TESTING W REFLEX): HIV Screen 4th Generation wRfx: NONREACTIVE

## 2017-01-04 MED ORDER — MIDAZOLAM HCL 2 MG/2ML IJ SOLN
INTRAMUSCULAR | Status: AC
  Filled 2017-01-04: qty 2

## 2017-01-04 MED ORDER — MIDAZOLAM HCL 2 MG/2ML IJ SOLN
4.0000 mg | Freq: Once | INTRAMUSCULAR | Status: AC
  Administered 2017-01-04: 4 mg via INTRAVENOUS

## 2017-01-04 MED ORDER — MIDAZOLAM HCL 2 MG/2ML IJ SOLN: 1.0000 mg | INTRAMUSCULAR | Status: DC | PRN

## 2017-01-04 MED ORDER — MIDAZOLAM HCL 2 MG/2ML IJ SOLN
1.0000 mg | INTRAMUSCULAR | Status: DC | PRN
  Administered 2017-01-05 – 2017-01-20 (×27): 1 mg via INTRAVENOUS
  Filled 2017-01-04 (×27): qty 2

## 2017-01-04 MED ORDER — MIDAZOLAM HCL 2 MG/2ML IJ SOLN
INTRAMUSCULAR | Status: AC
  Administered 2017-01-04: 4 mg via INTRAVENOUS
  Filled 2017-01-04: qty 2

## 2017-01-04 MED ORDER — FENTANYL CITRATE (PF) 100 MCG/2ML IJ SOLN
INTRAMUSCULAR | Status: AC
  Administered 2017-01-04: 50 ug via INTRAVENOUS
  Filled 2017-01-04: qty 2

## 2017-01-04 MED ORDER — DOCUSATE SODIUM 50 MG/5ML PO LIQD
100.0000 mg | Freq: Two times a day (BID) | ORAL | Status: DC | PRN
  Administered 2017-01-07: 100 mg
  Filled 2017-01-04: qty 10

## 2017-01-04 MED ORDER — ROCURONIUM BROMIDE 50 MG/5ML IV SOLN
50.0000 mg | Freq: Once | INTRAVENOUS | Status: AC
  Administered 2017-01-04: 50 mg via INTRAVENOUS

## 2017-01-04 MED ORDER — MORPHINE SULFATE (PF) 2 MG/ML IV SOLN
0.5000 mg | INTRAVENOUS | Status: DC | PRN
  Administered 2017-01-04 (×2): 0.5 mg via INTRAVENOUS
  Filled 2017-01-04 (×2): qty 1

## 2017-01-04 MED ORDER — LORAZEPAM 2 MG/ML IJ SOLN
0.2500 mg | Freq: Once | INTRAMUSCULAR | Status: AC
  Administered 2017-01-04: 0.25 mg via INTRAVENOUS

## 2017-01-04 MED ORDER — LORAZEPAM 2 MG/ML IJ SOLN
0.5000 mg | Freq: Once | INTRAMUSCULAR | Status: AC
  Administered 2017-01-04: 0.5 mg via INTRAVENOUS
  Filled 2017-01-04: qty 1

## 2017-01-04 MED ORDER — FENTANYL 2500MCG IN NS 250ML (10MCG/ML) PREMIX INFUSION
0.0000 ug/h | INTRAVENOUS | Status: DC
  Filled 2017-01-04: qty 250

## 2017-01-04 MED ORDER — FENTANYL 2500MCG IN NS 250ML (10MCG/ML) PREMIX INFUSION
25.0000 ug/h | INTRAVENOUS | Status: DC
  Administered 2017-01-05: 50 ug/h via INTRAVENOUS
  Administered 2017-01-06: 100 ug/h via INTRAVENOUS
  Administered 2017-01-07: 50 ug/h via INTRAVENOUS
  Administered 2017-01-08: 175 ug/h via INTRAVENOUS
  Administered 2017-01-08 – 2017-01-09 (×2): 200 ug/h via INTRAVENOUS
  Administered 2017-01-09: 250 ug/h via INTRAVENOUS
  Administered 2017-01-10: 75 ug/h via INTRAVENOUS
  Administered 2017-01-10: 200 ug/h via INTRAVENOUS
  Administered 2017-01-14: 100 ug/h via INTRAVENOUS
  Administered 2017-01-15: 50 ug/h via INTRAVENOUS
  Administered 2017-01-17: 100 ug/h via INTRAVENOUS
  Administered 2017-01-19: 25 ug/h via INTRAVENOUS
  Administered 2017-01-20: 75 ug/h via INTRAVENOUS
  Filled 2017-01-04 (×18): qty 250

## 2017-01-04 MED ORDER — FENTANYL CITRATE (PF) 100 MCG/2ML IJ SOLN
50.0000 ug | Freq: Once | INTRAMUSCULAR | Status: AC
  Administered 2017-01-05: 50 ug via INTRAVENOUS

## 2017-01-04 MED ORDER — FENTANYL BOLUS VIA INFUSION
25.0000 ug | INTRAVENOUS | Status: DC | PRN
  Administered 2017-01-05 – 2017-01-17 (×10): 25 ug via INTRAVENOUS
  Filled 2017-01-04: qty 25

## 2017-01-04 MED ORDER — POTASSIUM CHLORIDE CRYS ER 20 MEQ PO TBCR: 40.0000 meq | EXTENDED_RELEASE_TABLET | ORAL | Status: AC

## 2017-01-04 MED ORDER — LORAZEPAM 2 MG/ML IJ SOLN
INTRAMUSCULAR | Status: AC
  Filled 2017-01-04: qty 1

## 2017-01-04 MED ORDER — FENTANYL CITRATE (PF) 100 MCG/2ML IJ SOLN
50.0000 ug | Freq: Once | INTRAMUSCULAR | Status: AC
  Administered 2017-01-04: 50 ug via INTRAVENOUS

## 2017-01-04 MED ORDER — NOREPINEPHRINE BITARTRATE 1 MG/ML IV SOLN
0.0000 ug/min | INTRAVENOUS | Status: DC
  Administered 2017-01-05: 10 ug/min via INTRAVENOUS
  Administered 2017-01-05: 12 ug/min via INTRAVENOUS
  Administered 2017-01-05: 2 ug/min via INTRAVENOUS
  Administered 2017-01-05: 9 ug/min via INTRAVENOUS
  Administered 2017-01-06: 5 ug/min via INTRAVENOUS
  Filled 2017-01-04 (×5): qty 4

## 2017-01-04 NOTE — Progress Notes (Signed)
RN paged because pt was breathing heavy and desatting into the upper 80s on Bipap. RT had adjusted bipap to 100% FIO2 and 12/8. NP to bedside.  Brief HPI: pt admitted 11/14 with SOB and hypoxia found to be in CHF decompensation with elevated pro-BNP and 2 lobar PNA. Also Afib with RVR and was on Cardizem drip until removed 11/14 later in day for hypotension. Cardiology is involved in her care.  S: Pt denies pain at present. Says her breathing is "not too good".  O: Poor appearing elderly WF in mild to moderate respiratory distress on bipap. Easily aroused with name calling. Knows name and that she is in [facility] hospital. Asked NP to call her daughter. BP 106. HR 120s Afib. RR 35-42. O2 sat 89-93%. Card: irreg irreg. Lungs. Good air exchange. Crackles LLL. Pale. Skin is dry.  A/P: 1. Acute respiratory failure secondary to 2 lobar PNA and CHF decompensation. Very concerned for her respiratory rate and think pt will have to be tubed.Tried a little MSO4 and Ativan without success. Talked with pt about this and even though she knows her name and that she is in the hospital, she did not fully grasp the idea of resuscitation, life support or ventilator. Spoke to daughter on phone who agrees that pt is Full code and ventilator should be used if necessary. Daughter called again and told pt would be intubated. Daughter to come in the am.  ABG with normal pH, O2 sat of 84%, normal PCO2, and PO2 50%. Echo today showed EF 55-60% with normal systolic function. No comment in report about diastolic function.  PCCM called immediately for pt to be tubed and will see pt now. Transfer to ICU and PCCM will take over care after intubation.   CRITICAL CARE Performed by: Clance Boll   Total critical care time: 75 minutes  Critical care time was exclusive of separately billable procedures and treating other patients.  Critical care was necessary to treat or prevent imminent or life-threatening deterioration.  Critical  care was time spent personally by me on the following activities: development of treatment plan with patient and/or surrogate as well as nursing, discussions with consultants, evaluation of patient's response to treatment, examination of patient, obtaining history from patient or surrogate, ordering and performing treatments and interventions, ordering and review of laboratory studies, ordering and review of radiographic studies, pulse oximetry and re-evaluation of patient's condition.

## 2017-01-04 NOTE — Progress Notes (Signed)
The Dr. Annie Main my call and instructed me to keep the cardizem drip at 5.0 ml/hr. Will continue to monitor the pt.

## 2017-01-04 NOTE — Progress Notes (Signed)
RT called to room by RN due to patient sat drop to 82%. Patient oxygen increased from 60%  to 100%. Patient has labored breathing, use of accessory muscles noted, vitals are stable and sats are now 90%. RT will continue to monitor.

## 2017-01-04 NOTE — Progress Notes (Signed)
bp- 49'J systolic, SU-19 cardizem gtt put on hold. md aware.

## 2017-01-04 NOTE — Plan of Care (Signed)
Advised the pt about the importance of using the call bell before trying to get out of bed. The pt verbalized the importance of not trying to get out of bed unassisted.

## 2017-01-04 NOTE — Progress Notes (Signed)
Desats on bipap 60%Fio2, not in resp. Distress., RT made aware .

## 2017-01-04 NOTE — Progress Notes (Signed)
RT arrived to pt room for nebulizer treatment, pt had taken NRB off and sats were in the low 80"s RT placed mask back on and started treatment. RT noticed pt to have labored breathing with respirations in the high 30's to 40's. Pt using accessory muscles to breath and could not speak a full sentence. RT placed pt on BiPAP, pt tolerating well at this time, respirations have come down to the high 20's low 30's. RT to continue to monitor as needed. RN aware of situation.

## 2017-01-04 NOTE — Progress Notes (Signed)
Pts BP 87/50. Decreased cardizem gtt from 7.5 to 5.0 and Respiratory therapist came and put the pt on Bipap. Will continue to monitor. I paged Dr. Baltazar Najjar to inform them of pts condition.

## 2017-01-04 NOTE — Progress Notes (Signed)
PROGRESS NOTE [facility]   Morgan Cooper   PZW:[id number] DOB: January 13, 1946  DOA: 01/03/2017 PCP: Morgan Cha, MD   Brief Narrative:  Morgan Cooper is a 71 year old female with medical history significant for prior renal transplant on immunosuppressive agent, recently diagnosed atrial fibrillation and anxiety presented to the emergency department complaining of shortness of breath and near syncope.  She was found to be hypoxic by EMS with saturations in the 70s.  Upon  ED evaluation she was noted to be in A. fib with RVR started on Cardizem drip and a BNP of 2077, she also was noted to be hypoxemic, therefore CT angiogram of the chest was performed ruling out PE although noted to have R upper lobe pneumonia.  Patient was admitted with working diagnosis of A. fib with RVR and community-acquired pneumonia.  Cardiology was consulted.   Subjective: Patient seen and examined with nurse at bedside. Overnight was placed on BiPAP and given ativan, now lethargic, not able to answer questions.   Assessment & Plan: A fib with RVR - recently diagnosed in setting of infectious process  Patient was placed on cardizem ggt - will hold for now as patient appears hypotensive  Hold metoprolol as well  ECHO pending  Continue Eliquis  TSH normal  Cardiology recommendations appreciated   Acute respiratory failure with hypoxia  Multifactorial - PNA, A fib, possible heart failure Will obtain ABG, patient was placed on BiPAP overnight due to severe hypoxia on 100% O2 supplementation. CTA no PE, patient was on Eliquis PTA. Treat underlying causes.  Pneumonia - RUL CAP  Patient was started on Rocephin, Azithro and Vancomycin as patients is immunocompromise  D/c Vanc MRSA PCR negative  Continue Ceftriaxone and Azithro  Strep pneumo antigen negative Continue to monitor   Hypotension  ? Due to medications Ativan/Cardizem/Metoprolol vs pneumonia  Hold antihypertensive medications for now. If BP  continues to be low may need IVF vs pressors  Continue to monitor on SDU   Lethargy/AMS  Due to Ativan/Seroquel/Xanax, patient was given ativan for agitation, now appear lethargic.  Avoid Benzo,  If continues to be the same over the next 3-4 hours, will get a head CT   Hx of renal transplant  Continue current management   DVT prophylaxis: Eliquis  Code Status: Full Code  Family Communication: None at bedside  Disposition Plan: Home when medically stable   Consultants:   Cardiology   Procedures:   None   Antimicrobials:  Azithromycin 11/14>>  Rocephin 11/14 >>  Vancomycin 11/14>>11/15   Objective: Vitals:   01/04/17 0345 01/04/17 0736 01/04/17 0749 01/04/17 0800  BP: 95/63 94/61 94/61    Pulse: 88 99 96 94  Resp: (!) 27 (!) 31 (!) 28   Temp: 98.5 F (36.9 C) 98.1 F (36.7 C)    TempSrc: Axillary Axillary    SpO2: (!) 89% 93% 96% 95%  Weight:      Height:        Intake/Output Summary (Last 24 hours) at 01/04/2017 0847 Last data filed at 01/04/2017 0800 Gross per 24 hour  Intake 988.46 ml  Output 1000 ml  Net -11.54 ml   Filed Weights   01/03/17 2017  Weight: 50.6 kg (111 lb 8.8 oz)    Examination:  General exam: Lethargic, responsive to painful stimuli only Respiratory system: Clear to auscultation. No wheezes,crackle or rhonchi, on BiPAP  Cardiovascular system: S1 & S2 heard, RRR. No JVD, murmurs, rubs or gallops Gastrointestinal system: Abdomen is nondistended, soft and nontender.  Normal bowel sounds heard. Central nervous system: Lethargic, not following commands, responsive to painful stimuli only Extremities: No LE/ pedal edema.  Skin: No rashes, lesions or ulcers Psychiatry: Unable to evaluate   Data Reviewed: I have personally reviewed following labs and imaging studies  CBC: Recent Labs  Lab 01/03/17 0708 01/04/17 0432  WBC 9.0 8.8  NEUTROABS 6.4  --   HGB 11.3* 11.6*  HCT 34.7* 35.7*  MCV 94.3 96.0  PLT 197 676   Basic  Metabolic Panel: Recent Labs  Lab 01/03/17 0708 01/04/17 0432  NA 135 135  K 4.3 3.4*  CL 104 99*  CO2 22 23  GLUCOSE 182* 183*  BUN 10 7  CREATININE 0.99 0.80  CALCIUM 8.6* 8.7*   GFR: Estimated Creatinine Clearance: 50.8 mL/min (by C-G formula based on SCr of 0.8 mg/dL). Liver Function Tests: Recent Labs  Lab 01/03/17 0708  AST 27  ALT 18  ALKPHOS 65  BILITOT 1.1  PROT 6.4*  ALBUMIN 3.3*   No results for input(s): LIPASE, AMYLASE in the last 168 hours. No results for input(s): AMMONIA in the last 168 hours. Coagulation Profile: Recent Labs  Lab 01/03/17 0708  INR 1.59   Cardiac Enzymes: No results for input(s): CKTOTAL, CKMB, CKMBINDEX, TROPONINI in the last 168 hours. BNP (last 3 results) No results for input(s): PROBNP in the last 8760 hours. HbA1C: No results for input(s): HGBA1C in the last 72 hours. CBG: No results for input(s): GLUCAP in the last 168 hours. Lipid Profile: No results for input(s): CHOL, HDL, LDLCALC, TRIG, CHOLHDL, LDLDIRECT in the last 72 hours. Thyroid Function Tests: Recent Labs    01/03/17 0729  TSH 1.935   Anemia Panel: No results for input(s): VITAMINB12, FOLATE, FERRITIN, TIBC, IRON, RETICCTPCT in the last 72 hours. Sepsis Labs: Recent Labs  Lab 01/03/17 1004 01/03/17 1158  LATICACIDVEN 1.45 1.70    Recent Results (from the past 240 hour(s))  MRSA PCR Screening     Status: None   Collection Time: 01/03/17  9:29 PM  Result Value Ref Range Status   MRSA by PCR NEGATIVE NEGATIVE Final    Comment:        The GeneXpert MRSA Assay (FDA approved for NASAL specimens only), is one component of a comprehensive MRSA colonization surveillance program. It is not intended to diagnose MRSA infection nor to guide or monitor treatment for MRSA infections.      Radiology Studies: Ct Angio Chest Pe W And/or Wo Contrast  Result Date: 01/03/2017 CLINICAL DATA:  CVA, hypertension, renal transplant EXAM: CT ANGIOGRAPHY CHEST  WITH CONTRAST TECHNIQUE: Multidetector CT imaging of the chest was performed using the standard protocol during bolus administration of intravenous contrast. Multiplanar CT image reconstructions and MIPs were obtained to evaluate the vascular anatomy. CONTRAST:  <See Chart> ISOVUE-370 IOPAMIDOL (ISOVUE-370) INJECTION 76% COMPARISON:  None. FINDINGS: Cardiovascular: No filling defects within the pulmonary arteries arteries to suggest acute pulmonary embolism. No acute findings of the aorta. Calcification of the aorta and the coronary arteries. No pericardial effusion Mediastinum/Nodes: No axillary or supraclavicular adenopathy. No mediastinal hilar adenopathy. Lungs/Pleura: There is RIGHT upper lobe airspace disease superimposed on centrilobular emphysema. Mild airspace disease in the RIGHT lower lobe and small RIGHT effusion. Upper Abdomen: Limited view of the liver, kidneys, pancreas are unremarkable. Normal adrenal glands. Musculoskeletal: No aggressive osseous lesion. Remote sternal fracture. Review of the MIP images confirms the above findings. IMPRESSION: 1. No evidence acute pulmonary embolism. 2. Diffuse airspace disease in the RIGHT upper  lobe superimposed on centrilobular emphysema. Etiology includes pulmonary edema versus pneumonia. 3.  Coronary artery calcification Aortic Atherosclerosis (ICD10-I70.0) and Emphysema (ICD10-J43.9). Electronically Signed   By: Suzy Bouchard M.D.   On: 01/03/2017 09:39   Dg Chest Port 1 View  Result Date: 01/03/2017 CLINICAL DATA:  Shortness of breath. Weakness for 2 weeks. Dry cough. EXAM: PORTABLE CHEST 1 VIEW COMPARISON:  Two-view chest x-ray a 02/17/2016. FINDINGS: The heart is enlarged. Aortic atherosclerosis is again seen. Right upper and left lower lobe airspace disease is superimposed on chronic interstitial coarsening. Pleural effusions are not excluded. The visualized soft tissues and bony thorax are unremarkable. IMPRESSION: 1. Right upper lobe and left  lower lobe pneumonia superimposed on chronic disease. 2. Cardiomegaly with pulmonary vascular congestion and probable effusions. An element of congestive heart failure is likely also present. 3. Aortic atherosclerosis. Electronically Signed   By: San Morelle M.D.   On: 01/03/2017 07:29    Scheduled Meds: . apixaban  5 mg Oral BID  . atorvastatin  20 mg Oral Daily  . azaTHIOprine  50 mg Oral Daily  . cycloSPORINE  1 drop Both Eyes BID  . ipratropium  0.5 mg Nebulization Q6H  . levalbuterol  0.63 mg Nebulization Q6H  . metoprolol tartrate  100 mg Oral BID  . pantoprazole  40 mg Oral Daily  . potassium chloride  40 mEq Oral Q4H  . predniSONE  7.5 mg Oral Q breakfast  . QUEtiapine  200 mg Oral QHS   Continuous Infusions: . azithromycin Stopped (01/03/17 1337)  . cefTRIAXone (ROCEPHIN)  IV Stopped (01/03/17 1328)  . diltiazem (CARDIZEM) infusion 5 mg/hr (01/04/17 0300)     LOS: 1 day    Time spent: Total of 35 minutes spent with pt, greater than 50% of which was spent in discussion of  treatment, counseling and coordination of care   Chipper Oman, MD Pager: Text Page via [email or web address]   If 7PM-7AM, please contact night-coverage [email or web address] 01/04/2017, 8:47 AM

## 2017-01-04 NOTE — Progress Notes (Signed)
PT Cancellation Note  Patient Details Name: Morgan Cooper MRN: [id number] DOB: [birth year]   Cancelled Treatment:    Reason Eval/Treat Not Completed: Patient not medically ready. RN requesting PT return for evaluation as pt currently on BiPAP. Will follow-up as schedule permits.  Mabeline Caras, PT, DPT Acute Rehab Services  Pager: [place] 01/04/2017, 11:21 AM

## 2017-01-04 NOTE — Progress Notes (Addendum)
  Echocardiogram 2D Echocardiogram has been performed.  Technically difficult exam due to patient respiratory and HR.  Morgan Cooper 01/04/2017, 3:14 PM

## 2017-01-04 NOTE — Progress Notes (Signed)
DAILY PROGRESS NOTE   Patient Name: Morgan Cooper Date of Encounter: 01/04/2017  Chief Complaint   Short of breath  Patient Profile   71 yo patient of Dr. Irish Lack with new onset a-fib with RVR, possible underlying PNA and likely CHF.  Subjective   Was on NRB and BIPAP overnight - now in [place]. Hypotension noted as well overnight- a-fib rates generally well-controlled. Net even recorded fluids, weight 111 lbs yesterday, not measured today. Echo pending.   Objective   Vitals:   01/04/17 0345 01/04/17 0736 01/04/17 0749 01/04/17 0800  BP: 95/63 94/61 94/61    Pulse: 88 99 96 94  Resp: (!) 27 (!) 31 (!) 28   Temp: 98.5 F (36.9 C) 98.1 F (36.7 C)    TempSrc: Axillary Axillary    SpO2: (!) 89% 93% 96% 95%  Weight:      Height:        Intake/Output Summary (Last 24 hours) at 01/04/2017 0842 Last data filed at 01/04/2017 0800 Gross per 24 hour  Intake 988.46 ml  Output 1000 ml  Net -11.54 ml   Filed Weights   01/03/17 2017  Weight: 111 lb 8.8 oz (50.6 kg)    Physical Exam   General appearance: on bipap, sleeping Neck: no carotid bruit, no JVD and thyroid not enlarged, symmetric, no tenderness/mass/nodules Lungs: diminished breath sounds bilaterally and rhonchi bilaterally Heart: irregularly irregular rhythm Abdomen: soft, non-tender; bowel sounds normal; no masses,  no organomegaly Extremities: extremities normal, atraumatic, no cyanosis or edema Neurologic: Mental status: on BIPAP, asleep  Inpatient Medications    Scheduled Meds: . apixaban  5 mg Oral BID  . atorvastatin  20 mg Oral Daily  . azaTHIOprine  50 mg Oral Daily  . cycloSPORINE  1 drop Both Eyes BID  . ipratropium  0.5 mg Nebulization Q6H  . levalbuterol  0.63 mg Nebulization Q6H  . metoprolol tartrate  100 mg Oral BID  . pantoprazole  40 mg Oral Daily  . potassium chloride  40 mEq Oral Q4H  . predniSONE  7.5 mg Oral Q breakfast  . QUEtiapine  200 mg Oral QHS    Continuous Infusions: .  azithromycin Stopped (01/03/17 1337)  . cefTRIAXone (ROCEPHIN)  IV Stopped (01/03/17 1328)  . diltiazem (CARDIZEM) infusion 5 mg/hr (01/04/17 0300)    PRN Meds: acetaminophen, ALPRAZolam, HYDROcodone-acetaminophen, levalbuterol, ondansetron (ZOFRAN) IV, ondansetron   Labs   Results for orders placed or performed during the hospital encounter of 01/03/17 (from the past 48 hour(s))  CBC with Differential/Platelet     Status: Abnormal   Collection Time: 01/03/17  7:08 AM  Result Value Ref Range   WBC 9.0 4.0 - 10.5 K/uL   RBC 3.68 (L) 3.87 - 5.11 MIL/uL   Hemoglobin 11.3 (L) 12.0 - 15.0 g/dL   HCT 34.7 (L) 36.0 - 46.0 %   MCV 94.3 78.0 - 100.0 fL   MCH 30.7 26.0 - 34.0 pg   MCHC 32.6 30.0 - 36.0 g/dL   RDW 14.8 11.5 - 15.5 %   Platelets 197 150 - 400 K/uL   Neutrophils Relative % 71 %   Neutro Abs 6.4 1.7 - 7.7 K/uL   Lymphocytes Relative 15 %   Lymphs Abs 1.3 0.7 - 4.0 K/uL   Monocytes Relative 13 %   Monocytes Absolute 1.2 (H) 0.1 - 1.0 K/uL   Eosinophils Relative 1 %   Eosinophils Absolute 0.1 0.0 - 0.7 K/uL   Basophils Relative 0 %   Basophils Absolute  0.0 0.0 - 0.1 K/uL  Comprehensive metabolic panel     Status: Abnormal   Collection Time: 01/03/17  7:08 AM  Result Value Ref Range   Sodium 135 135 - 145 mmol/L   Potassium 4.3 3.5 - 5.1 mmol/L   Chloride 104 101 - 111 mmol/L   CO2 22 22 - 32 mmol/L   Glucose, Bld 182 (H) 65 - 99 mg/dL   BUN 10 6 - 20 mg/dL   Creatinine, Ser 0.99 0.44 - 1.00 mg/dL   Calcium 8.6 (L) 8.9 - 10.3 mg/dL   Total Protein 6.4 (L) 6.5 - 8.1 g/dL   Albumin 3.3 (L) 3.5 - 5.0 g/dL   AST 27 15 - 41 U/L   ALT 18 14 - 54 U/L   Alkaline Phosphatase 65 38 - 126 U/L   Total Bilirubin 1.1 0.3 - 1.2 mg/dL   GFR calc non Af Amer 56 (L) >60 mL/min   GFR calc Af Amer >60 >60 mL/min    Comment: (NOTE) The eGFR has been calculated using the CKD EPI equation. This calculation has not been validated in all clinical situations. eGFR's persistently <60  mL/min signify possible Chronic Kidney Disease.    Anion gap 9 5 - 15  Brain natriuretic peptide     Status: Abnormal   Collection Time: 01/03/17  7:08 AM  Result Value Ref Range   B Natriuretic Peptide 2,077.8 (H) 0.0 - 100.0 pg/mL  Protime-INR     Status: Abnormal   Collection Time: 01/03/17  7:08 AM  Result Value Ref Range   Prothrombin Time 18.8 (H) 11.4 - 15.2 seconds   INR 1.59   I-stat troponin, ED     Status: None   Collection Time: 01/03/17  7:17 AM  Result Value Ref Range   Troponin i, poc 0.03 0.00 - 0.08 ng/mL   Comment 3            Comment: Due to the release kinetics of cTnI, a negative result within the first hours of the onset of symptoms does not rule out myocardial infarction with certainty. If myocardial infarction is still suspected, repeat the test at appropriate intervals.   TSH     Status: None   Collection Time: 01/03/17  7:29 AM  Result Value Ref Range   TSH 1.935 0.350 - 4.500 uIU/mL    Comment: Performed by a 3rd Generation assay with a functional sensitivity of <=0.01 uIU/mL.  I-Stat CG4 Lactic Acid, ED     Status: None   Collection Time: 01/03/17 10:04 AM  Result Value Ref Range   Lactic Acid, Venous 1.45 0.5 - 1.9 mmol/L  HIV antibody     Status: None   Collection Time: 01/03/17 10:44 AM  Result Value Ref Range   HIV Screen 4th Generation wRfx Non Reactive Non Reactive    Comment: (NOTE) Performed At: [facility] [address] [place], [place] [id number] Rush Farmer MD SH:[id number]   Strep pneumoniae urinary antigen     Status: None   Collection Time: 01/03/17 11:00 AM  Result Value Ref Range   Strep Pneumo Urinary Antigen NEGATIVE NEGATIVE    Comment:        Infection due to S. pneumoniae cannot be absolutely ruled out since the antigen present may be below the detection limit of the test.   Influenza panel by PCR (type A & B)     Status: None   Collection Time: 01/03/17 11:13 AM  Result Value Ref Range  Influenza  A By PCR NEGATIVE NEGATIVE   Influenza B By PCR NEGATIVE NEGATIVE    Comment: (NOTE) The Xpert Xpress Flu assay is intended as an aid in the diagnosis of  influenza and should not be used as a sole basis for treatment.  This  assay is FDA approved for nasopharyngeal swab specimens only. Nasal  washings and aspirates are unacceptable for Xpert Xpress Flu testing.   I-Stat CG4 Lactic Acid, ED     Status: None   Collection Time: 01/03/17 11:58 AM  Result Value Ref Range   Lactic Acid, Venous 1.70 0.5 - 1.9 mmol/L  MRSA PCR Screening     Status: None   Collection Time: 01/03/17  9:29 PM  Result Value Ref Range   MRSA by PCR NEGATIVE NEGATIVE    Comment:        The GeneXpert MRSA Assay (FDA approved for NASAL specimens only), is one component of a comprehensive MRSA colonization surveillance program. It is not intended to diagnose MRSA infection nor to guide or monitor treatment for MRSA infections.   Basic metabolic panel     Status: Abnormal   Collection Time: 01/04/17  4:32 AM  Result Value Ref Range   Sodium 135 135 - 145 mmol/L   Potassium 3.4 (L) 3.5 - 5.1 mmol/L   Chloride 99 (L) 101 - 111 mmol/L   CO2 23 22 - 32 mmol/L   Glucose, Bld 183 (H) 65 - 99 mg/dL   BUN 7 6 - 20 mg/dL   Creatinine, Ser 0.80 0.44 - 1.00 mg/dL   Calcium 8.7 (L) 8.9 - 10.3 mg/dL   GFR calc non Af Amer >60 >60 mL/min   GFR calc Af Amer >60 >60 mL/min    Comment: (NOTE) The eGFR has been calculated using the CKD EPI equation. This calculation has not been validated in all clinical situations. eGFR's persistently <60 mL/min signify possible Chronic Kidney Disease.    Anion gap 13 5 - 15  CBC     Status: Abnormal   Collection Time: 01/04/17  4:32 AM  Result Value Ref Range   WBC 8.8 4.0 - 10.5 K/uL   RBC 3.72 (L) 3.87 - 5.11 MIL/uL   Hemoglobin 11.6 (L) 12.0 - 15.0 g/dL   HCT 35.7 (L) 36.0 - 46.0 %   MCV 96.0 78.0 - 100.0 fL   MCH 31.2 26.0 - 34.0 pg   MCHC 32.5 30.0 - 36.0 g/dL   RDW 14.8  11.5 - 15.5 %   Platelets 218 150 - 400 K/uL    ECG   N/A  Telemetry   A-fib with variable VR - Personally Reviewed  Radiology    Ct Angio Chest Pe W And/or Wo Contrast  Result Date: 01/03/2017 CLINICAL DATA:  CVA, hypertension, renal transplant EXAM: CT ANGIOGRAPHY CHEST WITH CONTRAST TECHNIQUE: Multidetector CT imaging of the chest was performed using the standard protocol during bolus administration of intravenous contrast. Multiplanar CT image reconstructions and MIPs were obtained to evaluate the vascular anatomy. CONTRAST:  <See Chart> ISOVUE-370 IOPAMIDOL (ISOVUE-370) INJECTION 76% COMPARISON:  None. FINDINGS: Cardiovascular: No filling defects within the pulmonary arteries arteries to suggest acute pulmonary embolism. No acute findings of the aorta. Calcification of the aorta and the coronary arteries. No pericardial effusion Mediastinum/Nodes: No axillary or supraclavicular adenopathy. No mediastinal hilar adenopathy. Lungs/Pleura: There is RIGHT upper lobe airspace disease superimposed on centrilobular emphysema. Mild airspace disease in the RIGHT lower lobe and small RIGHT effusion. Upper Abdomen: Limited view of  the liver, kidneys, pancreas are unremarkable. Normal adrenal glands. Musculoskeletal: No aggressive osseous lesion. Remote sternal fracture. Review of the MIP images confirms the above findings. IMPRESSION: 1. No evidence acute pulmonary embolism. 2. Diffuse airspace disease in the RIGHT upper lobe superimposed on centrilobular emphysema. Etiology includes pulmonary edema versus pneumonia. 3.  Coronary artery calcification Aortic Atherosclerosis (ICD10-I70.0) and Emphysema (ICD10-J43.9). Electronically Signed   By: Suzy Bouchard M.D.   On: 01/03/2017 09:39   Dg Chest Port 1 View  Result Date: 01/03/2017 CLINICAL DATA:  Shortness of breath. Weakness for 2 weeks. Dry cough. EXAM: PORTABLE CHEST 1 VIEW COMPARISON:  Two-view chest x-ray a 02/17/2016. FINDINGS: The heart is  enlarged. Aortic atherosclerosis is again seen. Right upper and left lower lobe airspace disease is superimposed on chronic interstitial coarsening. Pleural effusions are not excluded. The visualized soft tissues and bony thorax are unremarkable. IMPRESSION: 1. Right upper lobe and left lower lobe pneumonia superimposed on chronic disease. 2. Cardiomegaly with pulmonary vascular congestion and probable effusions. An element of congestive heart failure is likely also present. 3. Aortic atherosclerosis. Electronically Signed   By: San Morelle M.D.   On: 01/03/2017 07:29    Cardiac Studies   N/A  Assessment   1. Principal Problem: 2.   Atrial fibrillation with RVR ([place]) 3. Active Problems: 4.   Chronic diastolic heart failure ([place]) 5.   History of renal transplant 6.   Interstitial lung disease ([place]) 7.   Protein-calorie malnutrition, severe 8.   Community acquired pneumonia 9.   Acute hypoxemic respiratory failure ([facility]) 10.   Plan   1. Net even overnight - BNP elevated, but more rhonchi than rales today - suspect developing pneumonia. Hypotensive. Now off cardizem. Could use low dose levophed if needed for MAP <65. Will not likely pursue restoration of sinus until after she recovers from pneumonia. Plan echo today.  Time Spent Directly with Patient:  I have spent a total of 25 minutes with the patient reviewing hospital notes, telemetry, EKGs, labs and examining the patient as well as establishing an assessment and plan that was discussed personally with the patient. > 50% of time was spent in direct patient care.  Length of Stay:  LOS: 1 day   Pixie Casino, MD, [facility], [place] Director of the [facility]  [facility] Attending Cardiologist  Direct Dial: [phone]  Fax: [phone]  Website:  [email or web address] 01/04/2017, 8:42 AM

## 2017-01-05 ENCOUNTER — Inpatient Hospital Stay (HOSPITAL_COMMUNITY): Payer: Medicare Other

## 2017-01-05 DIAGNOSIS — J9601 Acute respiratory failure with hypoxia: Secondary | ICD-10-CM

## 2017-01-05 DIAGNOSIS — L8990 Pressure ulcer of unspecified site, unspecified stage: Secondary | ICD-10-CM

## 2017-01-05 LAB — BLOOD GAS, ARTERIAL
Acid-Base Excess: 1.5 mmol/L (ref 0.0–2.0)
Acid-base deficit: 2.7 mmol/L — ABNORMAL HIGH (ref 0.0–2.0)
Acid-base deficit: 1.8 mmol/L (ref 0.0–2.0)
Bicarbonate: 25.3 mmol/L (ref 20.0–28.0)
Bicarbonate: 22.7 mmol/L (ref 20.0–28.0)
Bicarbonate: 23.1 mmol/L (ref 20.0–28.0)
Delivery systems: POSITIVE
Drawn by: 520761
Drawn by: 511551
Drawn by: 51155
Expiratory PAP: 6
FIO2: 100
FIO2: 1
FIO2: 70
Inspiratory PAP: 12
O2 SAT: 99.3 %
O2 Saturation: 84 %
O2 Saturation: 99.3 %
PATIENT TEMPERATURE: 98.6
PATIENT TEMPERATURE: 98.6
PH ART: 7.339 — AB (ref 7.350–7.450)
PO2 ART: 350 mmHg — AB (ref 83.0–108.0)
PO2 ART: 244 mmHg — AB (ref 83.0–108.0)
Patient temperature: 98.6
RATE: 8 resp/min
pCO2 arterial: 38.2 mmHg (ref 32.0–48.0)
pCO2 arterial: 47.4 mmHg (ref 32.0–48.0)
pCO2 arterial: 44.1 mmHg (ref 32.0–48.0)
pH, Arterial: 7.436 (ref 7.350–7.450)
pH, Arterial: 7.302 — ABNORMAL LOW (ref 7.350–7.450)
pO2, Arterial: 50.5 mmHg — ABNORMAL LOW (ref 83.0–108.0)

## 2017-01-05 LAB — CBC WITH DIFFERENTIAL/PLATELET
BASOS PCT: 0 %
Basophils Absolute: 0 10*3/uL (ref 0.0–0.1)
EOS ABS: 0 10*3/uL (ref 0.0–0.7)
Eosinophils Relative: 0 %
HCT: 33.7 % — ABNORMAL LOW (ref 36.0–46.0)
Hemoglobin: 11.1 g/dL — ABNORMAL LOW (ref 12.0–15.0)
LYMPHS ABS: 0.8 10*3/uL (ref 0.7–4.0)
Lymphocytes Relative: 6 %
MCH: 31.2 pg (ref 26.0–34.0)
MCHC: 32.9 g/dL (ref 30.0–36.0)
MCV: 94.7 fL (ref 78.0–100.0)
MONO ABS: 0.8 10*3/uL (ref 0.1–1.0)
MONOS PCT: 6 %
Neutro Abs: 12.5 10*3/uL — ABNORMAL HIGH (ref 1.7–7.7)
Neutrophils Relative %: 88 %
Platelets: 257 10*3/uL (ref 150–400)
RBC: 3.56 MIL/uL — ABNORMAL LOW (ref 3.87–5.11)
RDW: 14.5 % (ref 11.5–15.5)
WBC: 14.2 10*3/uL — ABNORMAL HIGH (ref 4.0–10.5)

## 2017-01-05 LAB — GLUCOSE, CAPILLARY
GLUCOSE-CAPILLARY: 272 mg/dL — AB (ref 65–99)
GLUCOSE-CAPILLARY: 137 mg/dL — AB (ref 65–99)
Glucose-Capillary: 307 mg/dL — ABNORMAL HIGH (ref 65–99)
Glucose-Capillary: 243 mg/dL — ABNORMAL HIGH (ref 65–99)
Glucose-Capillary: 264 mg/dL — ABNORMAL HIGH (ref 65–99)
Glucose-Capillary: 228 mg/dL — ABNORMAL HIGH (ref 65–99)

## 2017-01-05 LAB — BASIC METABOLIC PANEL
Anion gap: 9 (ref 5–15)
Anion gap: 8 (ref 5–15)
BUN: 14 mg/dL (ref 6–20)
BUN: 28 mg/dL — ABNORMAL HIGH (ref 6–20)
CHLORIDE: 103 mmol/L (ref 101–111)
CO2: 24 mmol/L (ref 22–32)
CO2: 24 mmol/L (ref 22–32)
CREATININE: 0.95 mg/dL (ref 0.44–1.00)
CREATININE: 1.67 mg/dL — AB (ref 0.44–1.00)
Calcium: 8.4 mg/dL — ABNORMAL LOW (ref 8.9–10.3)
Calcium: 8.8 mg/dL — ABNORMAL LOW (ref 8.9–10.3)
Chloride: 102 mmol/L (ref 101–111)
GFR calc non Af Amer: 58 mL/min — ABNORMAL LOW (ref >60)
GFR calc non Af Amer: 30 mL/min — ABNORMAL LOW (ref >60)
GFR, EST AFRICAN AMERICAN: 34 mL/min — AB (ref >60)
GLUCOSE: 254 mg/dL — AB (ref 65–99)
Glucose, Bld: 295 mg/dL — ABNORMAL HIGH (ref 65–99)
POTASSIUM: 4.1 mmol/L (ref 3.5–5.1)
Potassium: 3.6 mmol/L (ref 3.5–5.1)
SODIUM: 134 mmol/L — AB (ref 135–145)
Sodium: 136 mmol/L (ref 135–145)

## 2017-01-05 LAB — PROCALCITONIN: PROCALCITONIN: 0.52 ng/mL

## 2017-01-05 LAB — RESPIRATORY PANEL BY PCR
ADENOVIRUS-RVPPCR: NOT DETECTED
Bordetella pertussis: NOT DETECTED
CORONAVIRUS HKU1-RVPPCR: NOT DETECTED
CORONAVIRUS NL63-RVPPCR: NOT DETECTED
CORONAVIRUS OC43-RVPPCR: NOT DETECTED
Chlamydophila pneumoniae: NOT DETECTED
Coronavirus 229E: NOT DETECTED
Influenza A: NOT DETECTED
Influenza B: NOT DETECTED
METAPNEUMOVIRUS-RVPPCR: NOT DETECTED
Mycoplasma pneumoniae: NOT DETECTED
PARAINFLUENZA VIRUS 1-RVPPCR: NOT DETECTED
PARAINFLUENZA VIRUS 2-RVPPCR: NOT DETECTED
PARAINFLUENZA VIRUS 3-RVPPCR: NOT DETECTED
PARAINFLUENZA VIRUS 4-RVPPCR: NOT DETECTED
RHINOVIRUS / ENTEROVIRUS - RVPPCR: NOT DETECTED
Respiratory Syncytial Virus: NOT DETECTED

## 2017-01-05 LAB — POCT I-STAT 3, ART BLOOD GAS (G3+)
ACID-BASE DEFICIT: 1 mmol/L (ref 0.0–2.0)
BICARBONATE: 25.3 mmol/L (ref 20.0–28.0)
O2 Saturation: 100 %
TCO2: 27 mmol/L (ref 22–32)
pCO2 arterial: 45.7 mmHg (ref 32.0–48.0)
pH, Arterial: 7.348 — ABNORMAL LOW (ref 7.350–7.450)
pO2, Arterial: 251 mmHg — ABNORMAL HIGH (ref 83.0–108.0)

## 2017-01-05 LAB — LACTIC ACID, PLASMA: LACTIC ACID, VENOUS: 1.9 mmol/L (ref 0.5–1.9)

## 2017-01-05 LAB — PHOSPHORUS
PHOSPHORUS: 3.5 mg/dL (ref 2.5–4.6)
Phosphorus: 2.6 mg/dL (ref 2.5–4.6)
Phosphorus: 2.9 mg/dL (ref 2.5–4.6)

## 2017-01-05 LAB — CORTISOL: CORTISOL PLASMA: 46.4 ug/dL

## 2017-01-05 LAB — MAGNESIUM
MAGNESIUM: 3.1 mg/dL — AB (ref 1.7–2.4)
MAGNESIUM: 2.9 mg/dL — AB (ref 1.7–2.4)
Magnesium: 1.9 mg/dL (ref 1.7–2.4)

## 2017-01-05 LAB — LACTATE DEHYDROGENASE: LDH: 305 U/L — AB (ref 98–192)

## 2017-01-05 LAB — TSH: TSH: 1.625 u[IU]/mL (ref 0.350–4.500)

## 2017-01-05 MED ORDER — AZATHIOPRINE 50 MG PO TABS
50.0000 mg | ORAL_TABLET | Freq: Every day | ORAL | Status: DC
  Filled 2017-01-05: qty 1

## 2017-01-05 MED ORDER — VITAL HIGH PROTEIN PO LIQD
1000.0000 mL | ORAL | Status: DC
  Administered 2017-01-05: 1000 mL

## 2017-01-05 MED ORDER — VITAL AF 1.2 CAL PO LIQD
1000.0000 mL | ORAL | Status: DC
  Administered 2017-01-05 – 2017-01-07 (×4): 1000 mL
  Filled 2017-01-05 (×2): qty 1000

## 2017-01-05 MED ORDER — POTASSIUM CHLORIDE 20 MEQ/15ML (10%) PO SOLN
40.0000 meq | Freq: Once | ORAL | Status: AC
  Administered 2017-01-05: 40 meq via ORAL
  Filled 2017-01-05: qty 30

## 2017-01-05 MED ORDER — INSULIN ASPART 100 UNIT/ML ~~LOC~~ SOLN
8.0000 [IU] | Freq: Once | SUBCUTANEOUS | Status: AC
  Administered 2017-01-05: 8 [IU] via SUBCUTANEOUS

## 2017-01-05 MED ORDER — ATORVASTATIN CALCIUM 20 MG PO TABS
20.0000 mg | ORAL_TABLET | Freq: Every day | ORAL | Status: DC
  Administered 2017-01-05 – 2017-01-24 (×20): 20 mg
  Filled 2017-01-05 (×20): qty 1

## 2017-01-05 MED ORDER — ORAL CARE MOUTH RINSE
15.0000 mL | Freq: Four times a day (QID) | OROMUCOSAL | Status: DC
Start: 2017-01-05 — End: 2017-01-22
  Administered 2017-01-05 – 2017-01-22 (×69): 15 mL via OROMUCOSAL

## 2017-01-05 MED ORDER — PANTOPRAZOLE SODIUM 40 MG IV SOLR
40.0000 mg | INTRAVENOUS | Status: DC
  Administered 2017-01-05 – 2017-01-06 (×2): 40 mg via INTRAVENOUS
  Filled 2017-01-05 (×2): qty 40

## 2017-01-05 MED ORDER — CHLORHEXIDINE GLUCONATE 0.12% ORAL RINSE (MEDLINE KIT)
15.0000 mL | Freq: Two times a day (BID) | OROMUCOSAL | Status: DC
  Administered 2017-01-05 – 2017-01-22 (×35): 15 mL via OROMUCOSAL

## 2017-01-05 MED ORDER — INSULIN ASPART 100 UNIT/ML ~~LOC~~ SOLN
0.0000 [IU] | SUBCUTANEOUS | Status: DC
  Administered 2017-01-05: 3 [IU] via SUBCUTANEOUS
  Administered 2017-01-05 (×2): 11 [IU] via SUBCUTANEOUS
  Administered 2017-01-05 – 2017-01-06 (×3): 7 [IU] via SUBCUTANEOUS
  Administered 2017-01-06: 15 [IU] via SUBCUTANEOUS
  Administered 2017-01-06 (×2): 20 [IU] via SUBCUTANEOUS
  Administered 2017-01-07 (×2): 7 [IU] via SUBCUTANEOUS
  Administered 2017-01-07: 11 [IU] via SUBCUTANEOUS
  Administered 2017-01-07 (×2): 7 [IU] via SUBCUTANEOUS
  Administered 2017-01-07: 11 [IU] via SUBCUTANEOUS
  Administered 2017-01-08: 7 [IU] via SUBCUTANEOUS
  Administered 2017-01-08: 4 [IU] via SUBCUTANEOUS
  Administered 2017-01-08: 11 [IU] via SUBCUTANEOUS
  Administered 2017-01-08: 4 [IU] via SUBCUTANEOUS
  Administered 2017-01-08 – 2017-01-09 (×3): 11 [IU] via SUBCUTANEOUS
  Administered 2017-01-09: 4 [IU] via SUBCUTANEOUS
  Administered 2017-01-09: 11 [IU] via SUBCUTANEOUS
  Administered 2017-01-09: 4 [IU] via SUBCUTANEOUS
  Administered 2017-01-09 (×2): 11 [IU] via SUBCUTANEOUS
  Administered 2017-01-10 (×2): 7 [IU] via SUBCUTANEOUS
  Administered 2017-01-10 (×2): 11 [IU] via SUBCUTANEOUS
  Administered 2017-01-10: 7 [IU] via SUBCUTANEOUS
  Administered 2017-01-10: 11 [IU] via SUBCUTANEOUS
  Administered 2017-01-11: 3 [IU] via SUBCUTANEOUS
  Administered 2017-01-11 (×3): 4 [IU] via SUBCUTANEOUS
  Administered 2017-01-11 – 2017-01-12 (×4): 3 [IU] via SUBCUTANEOUS
  Administered 2017-01-12: 4 [IU] via SUBCUTANEOUS
  Administered 2017-01-12: 3 [IU] via SUBCUTANEOUS
  Administered 2017-01-12: 4 [IU] via SUBCUTANEOUS
  Administered 2017-01-13: 7 [IU] via SUBCUTANEOUS
  Administered 2017-01-13: 3 [IU] via SUBCUTANEOUS
  Administered 2017-01-13 (×3): 4 [IU] via SUBCUTANEOUS
  Administered 2017-01-14 – 2017-01-15 (×5): 3 [IU] via SUBCUTANEOUS

## 2017-01-05 MED ORDER — PIPERACILLIN-TAZOBACTAM 3.375 G IVPB
3.3750 g | Freq: Three times a day (TID) | INTRAVENOUS | Status: DC
  Administered 2017-01-05 (×3): 3.375 g via INTRAVENOUS
  Filled 2017-01-05 (×4): qty 50

## 2017-01-05 MED ORDER — FUROSEMIDE 10 MG/ML IJ SOLN
40.0000 mg | Freq: Once | INTRAMUSCULAR | Status: AC
  Administered 2017-01-05: 40 mg via INTRAVENOUS
  Filled 2017-01-05: qty 4

## 2017-01-05 MED ORDER — PRO-STAT SUGAR FREE PO LIQD
30.0000 mL | Freq: Two times a day (BID) | ORAL | Status: DC
  Administered 2017-01-05: 30 mL
  Filled 2017-01-05 (×2): qty 30

## 2017-01-05 MED ORDER — APIXABAN 5 MG PO TABS
5.0000 mg | ORAL_TABLET | Freq: Two times a day (BID) | ORAL | Status: DC
  Administered 2017-01-05: 5 mg
  Filled 2017-01-05 (×2): qty 1

## 2017-01-05 MED ORDER — SODIUM CHLORIDE 0.9 % IV SOLN
INTRAVENOUS | Status: DC | PRN
  Administered 2017-01-09 – 2017-01-10 (×3): via INTRA_ARTERIAL
  Administered 2017-01-10: 250 mL via INTRA_ARTERIAL
  Administered 2017-01-11: 10 mL/h via INTRA_ARTERIAL
  Administered 2017-01-16: 19:00:00 via INTRA_ARTERIAL

## 2017-01-05 MED ORDER — INSULIN GLARGINE 100 UNIT/ML ~~LOC~~ SOLN
10.0000 [IU] | Freq: Every day | SUBCUTANEOUS | Status: DC
  Administered 2017-01-05: 10 [IU] via SUBCUTANEOUS
  Filled 2017-01-05: qty 0.1

## 2017-01-05 MED ORDER — INSULIN ASPART 100 UNIT/ML ~~LOC~~ SOLN
2.0000 [IU] | SUBCUTANEOUS | Status: DC
  Administered 2017-01-05 (×2): 4 [IU] via SUBCUTANEOUS

## 2017-01-05 MED ORDER — VANCOMYCIN HCL IN DEXTROSE 1-5 GM/200ML-% IV SOLN
1000.0000 mg | INTRAVENOUS | Status: DC
  Administered 2017-01-05: 1000 mg via INTRAVENOUS
  Filled 2017-01-05 (×2): qty 200

## 2017-01-05 MED ORDER — MAGNESIUM SULFATE 2 GM/50ML IV SOLN
2.0000 g | Freq: Once | INTRAVENOUS | Status: AC
  Administered 2017-01-05: 2 g via INTRAVENOUS
  Filled 2017-01-05: qty 50

## 2017-01-05 MED ORDER — PIPERACILLIN-TAZOBACTAM IN DEX 2-0.25 GM/50ML IV SOLN
2.2500 g | Freq: Three times a day (TID) | INTRAVENOUS | Status: AC
  Administered 2017-01-06 – 2017-01-11 (×18): 2.25 g via INTRAVENOUS
  Filled 2017-01-05 (×18): qty 50

## 2017-01-05 MED ORDER — DILTIAZEM HCL 100 MG IV SOLR
5.0000 mg/h | INTRAVENOUS | Status: DC
  Administered 2017-01-05: 5 mg/h via INTRAVENOUS
  Administered 2017-01-05 – 2017-01-07 (×7): 10 mg/h via INTRAVENOUS
  Administered 2017-01-08: 15 mg/h via INTRAVENOUS
  Administered 2017-01-08: 12.5 mg/h via INTRAVENOUS
  Administered 2017-01-09 – 2017-01-13 (×17): 15 mg/h via INTRAVENOUS
  Administered 2017-01-13: 10 mg/h via INTRAVENOUS
  Administered 2017-01-14 – 2017-01-15 (×3): 5 mg/h via INTRAVENOUS
  Administered 2017-01-17: 7.5 mg/h via INTRAVENOUS
  Filled 2017-01-05 (×36): qty 100

## 2017-01-05 MED ORDER — HYDROCORTISONE NA SUCCINATE PF 100 MG IJ SOLR
50.0000 mg | Freq: Four times a day (QID) | INTRAMUSCULAR | Status: DC
  Administered 2017-01-05 – 2017-01-10 (×22): 50 mg via INTRAVENOUS
  Filled 2017-01-05 (×17): qty 1
  Filled 2017-01-05: qty 2
  Filled 2017-01-05 (×3): qty 1
  Filled 2017-01-05: qty 2
  Filled 2017-01-05: qty 1

## 2017-01-05 NOTE — Consult Note (Addendum)
[place] Nurse wound consult note Reason for Consult: Consult requested for sacrum.  Pt has erythremia but it is blanchable; affected area approx 3X3cm.  Preventive measures are in place; she has a foam dressing and is on a low airloss bed to reduce pressure.  Discussed plan of care with family members at the bedside. Please re-consult if further assistance is needed.  Thank-you,  Julien Girt MSN, [place], [place], [place], [place]

## 2017-01-05 NOTE — Progress Notes (Signed)
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 Fib presented to the ED complaining of shortness of breath and near syncope found to be hypoxic saturating in the 70s placed on NRB.  Admitted by Hospitalist found to be in A. fib RVR started on Cardizem BNP 2077 CT angiogram ruled out a PE and she was found to have a multilobar pneumonia.   Sats 84-89% despite NIPPV PCCM consulted for Acute hypoxic respiratory failure in the setting of multilobar pneumonia.  PAST MEDICAL HISTORY :  She  has a past medical history of Anxiety, Arthritis, Barrett's esophagus, Bone pain, Bruises easily, Cancer ([place]), CHF (congestive heart failure) ([place]), Chills, CVA (cerebral infarction), Depression, Disturbed concentration, Diverticulitis of large intestine with perforation (Hx colectomy), Esophageal stricture, Fatigue, Forgetfulness, Gastroparesis, Generalized headaches, GERD (gastroesophageal reflux disease), Heart failure, diastolic, chronic ([place]), History of hiatal hernia, cardiovascular stress test, Hyperlipemia, Hypertension, Irritable, Itch, Keratosis, Macular degeneration, Nausea, Osteoporosis, Pancreatic cyst, Pancreatic mass, Panic attacks, Persistent headaches, Pulmonary hypertension ([place]), Rapid heart rate, Renal failure, Sleep apnea, Sleep difficulties, Stroke ([place]), and TIA (transient ischemic attack).  PAST SURGICAL HISTORY: She  has a past surgical history that includes Kidney transplant (1984); Appendectomy; Colon resection (2010); Colostomy (2011); colostomy reversal; left arm surgery; cataract surgery bilateral; Esophageal dilation;  Parathyroidectomy (11/04/2010); Pancreatectomy (N/A, 12/07/2015); ir generic historical (01/06/2016); ir generic historical (01/20/2016); ir generic historical (02/03/2016); ir generic historical (02/24/2016); ir generic historical (03/23/2016); ir generic historical (04/06/2016); ir generic historical (04/20/2016); IR Radiologist Eval & Mgmt (05/23/2016); and IR Radiologist Eval & Mgmt (05/03/2016).  Allergies  Allergen Reactions  . Amoxicillin Diarrhea    Has patient had a PCN reaction causing immediate rash, facial/tongue/throat swelling, SOB or lightheadedness with hypotension: No Has patient had a PCN reaction causing severe rash involving mucus membranes or skin necrosis: No Has patient had a PCN reaction that required hospitalization No Has patient had a PCN reaction occurring within the last 10 years: Yes If all of the above answers are "NO", then may proceed with Cephalosporin use.   . Tape Rash    No current facility-administered medications on file prior to encounter.    Current Outpatient Medications on File Prior to Encounter  Medication Sig  . apixaban (ELIQUIS) 5 MG TABS tablet Take 1 tablet (5 mg total) 2 (two) times daily by mouth.  Marland Kitchen atorvastatin (LIPITOR) 20 MG tablet Take 20 mg by mouth daily.   Marland Kitchen azaTHIOprine (IMURAN) 50 MG tablet Take 50 mg by mouth daily.   . cycloSPORINE (RESTASIS) 0.05 % ophthalmic emulsion Place 1 drop into both eyes 2 (two) times daily.    Marland Kitchen HYDROcodone-acetaminophen (NORCO) 10-325 MG per tablet Take 1 tablet by mouth every 6 (six) hours as needed (pain).   . metoprolol tartrate (LOPRESSOR) 50 MG tablet Take 2 tablets (100 mg total) 2 (two) times daily by mouth.  Marland Kitchen omeprazole (PRILOSEC) 40 MG capsule Take 40 mg 2 (two) times daily by mouth.   . OXYGEN Inhale 1 L into the lungs See admin instructions. Use at bedtime and as needed during activity  . predniSONE (DELTASONE) 5 MG tablet Take 7.5 mg by mouth daily with breakfast.   .  QUEtiapine (SEROQUEL) 200 MG  tablet Take 200 mg by mouth at bedtime.   Marland Kitchen trimethoprim (TRIMPEX) 100 MG tablet Take 1 tablet (100 mg total) by mouth at bedtime. continuous  . amLODipine (NORVASC) 5 MG tablet Take 1 tablet (5 mg total) by mouth daily.  Marland Kitchen FLUoxetine (PROZAC) 20 MG capsule Take 60 mg by mouth daily.   . ondansetron (ZOFRAN) 4 MG tablet Take 1 tablet (4 mg total) by mouth every 8 (eight) hours as needed for nausea or vomiting. (Patient not taking: Reported on 08/15/2016)    FAMILY HISTORY:  Her indicated that her mother is deceased. She indicated that her father is deceased. She indicated that only one of her two sisters is alive. She indicated that only one of her two brothers is alive. She indicated that her maternal aunt is deceased. She indicated that the status of her neg hx is unknown. She indicated that the status of her unknown relative is unknown.   SOCIAL HISTORY: She  reports that she quit smoking about 27 years ago. Her smoking use included cigarettes. She has a 5.00 pack-year smoking history. she has never used smokeless tobacco. She reports that she does not drink alcohol or use drugs.  REVIEW OF SYSTEMS:   Unable to provide, ventilated.   SUBJECTIVE:  Patient sedated, unable to provide.   VITAL SIGNS: BP 90/65   Pulse 95   Temp 97.6 F (36.4 C) (Oral)   Resp 10   Ht 5\' 5"  (1.651 m)   Wt 110 lb 10.7 oz (50.2 kg)   SpO2 100%   BMI 18.42 kg/m   HEMODYNAMICS: CVP:  [12 mmHg-15 mmHg] 12 mmHg  VENTILATOR SETTINGS: Vent Mode: Bi-Vent FiO2 (%):  [60 %-100 %] 60 % Set Rate:  [14 bmp] 14 bmp Vt Set:  [460 mL] 460 mL PEEP:  [8 cmH20] 8 cmH20 Plateau Pressure:  [21 cmH20] 21 cmH20  INTAKE / OUTPUT: I/O last 3 completed shifts: In: 1066.8 [P.O.:240; I.V.:426.8; IV Piggyback:400] Out: 500 [Urine:500]  PHYSICAL EXAMINATION: General:  Elderly female, resting in bed in NAD. Sedated.  Neuro:  Sedated.  HEENT:  NCAT, dry MMM  Cardiovascular:  Irregular rhythm, normal rate. No murmur.   Lungs:  Coarse breath sounds throughout, no accessory muscle use Abdomen:  Soft, nondistended, +BS Musculoskeletal:  Age appropriate muscle tone and bulk  Skin:  No rash on visualized skin  LABS:  BMET Recent Labs  Lab 01/04/17 0432 01/04/17 0951 01/05/17 0245  NA 135 136 136  K 3.4* 4.1 3.6  CL 99* 102 103  CO2 23 25 24   BUN 7 11 14   CREATININE 0.80 0.90 0.95  GLUCOSE 183* 188* 254*    Electrolytes Recent Labs  Lab 01/04/17 0432 01/04/17 0951 01/05/17 0245  CALCIUM 8.7* 8.7* 8.4*  MG  --   --  1.9  PHOS  --   --  3.5    CBC Recent Labs  Lab 01/04/17 0432 01/04/17 0951 01/05/17 0245  WBC 8.8 11.1* 14.2*  HGB 11.6* 11.5* 11.1*  HCT 35.7* 33.8* 33.7*  PLT 218 166 257    Coag's Recent Labs  Lab 01/03/17 0708  INR 1.59    Sepsis Markers Recent Labs  Lab 01/03/17 1004 01/03/17 1158 01/05/17 0245  LATICACIDVEN 1.45 1.70  --   PROCALCITON  --   --  0.52    ABG Recent Labs  Lab 01/04/17 2240 01/05/17 0135 01/05/17 0435  PHART 7.436 7.302* 7.339*  PCO2ART 38.2 47.4 44.1  PO2ART 50.5*  350* 244*    Liver Enzymes Recent Labs  Lab 01/03/17 0708 01/04/17 0951  AST 27 29  ALT 18 15  ALKPHOS 65 59  BILITOT 1.1 1.5*  ALBUMIN 3.3* 3.0*    Cardiac Enzymes No results for input(s): TROPONINI, PROBNP in the last 168 hours.  Glucose Recent Labs  Lab 01/05/17 0002 01/05/17 0353 01/05/17 0723  GLUCAP 197* 307* 243*    Imaging Dg Chest Port 1 View  Result Date: 01/05/2017 CLINICAL DATA:  Central line placement. EXAM: PORTABLE CHEST 1 VIEW COMPARISON:  Chest radiograph performed 01/04/2017 FINDINGS: A right IJ line is noted ending about the distal SVC. The patient's endotracheal tube is seen ending nearly 2 cm above the carina. An enteric tube is noted extending below the diaphragm. Diffuse bilateral airspace opacification may reflect pulmonary edema or pneumonia. No definite pleural effusion or pneumothorax is seen. The cardiomediastinal  silhouette is borderline enlarged. No acute osseous abnormalities are identified. IMPRESSION: 1. Right IJ line noted ending about the distal SVC. 2. Endotracheal tube noted ending nearly 2 cm above the carina. 3. Diffuse bilateral airspace opacification may reflect pulmonary edema or pneumonia, redistributed since the prior study. 4. Borderline cardiomegaly. Electronically Signed   By: Garald Balding M.D.   On: 01/05/2017 01:11   Dg Chest Port 1 View  Result Date: 01/05/2017 CLINICAL DATA:  Endotracheal tube and orogastric tube placement. EXAM: PORTABLE CHEST 1 VIEW COMPARISON:  Chest radiograph and CTA of the chest performed 01/03/2017 FINDINGS: The patient's endotracheal tube is seen ending 2 cm above the carina. An enteric tube is noted ending overlying the body of the stomach. Diffuse bilateral airspace opacification is worsened on both sides since the recent prior studies. A small left pleural effusion is noted. No pneumothorax is seen. The cardiomediastinal silhouette is borderline normal in size. No acute osseous abnormalities are seen. IMPRESSION: 1. Endotracheal tube seen ending 2 cm above the carina. 2. Enteric tube noted ending overlying the body of the stomach. 3. Worsening diffuse bilateral airspace opacification, concerning for multifocal pneumonia. Small left pleural effusion. Electronically Signed   By: Garald Balding M.D.   On: 01/05/2017 00:33   Dg Abd Portable 1v  Result Date: 01/05/2017 CLINICAL DATA:  71 year old female status post intubation and placement of enteric tube. EXAM: PORTABLE ABDOMEN - 1 VIEW COMPARISON:  CT of the abdomen pelvis dated 05/23/2016 FINDINGS: An endotracheal tube is partially visualized with tip and side-port in the left hemiabdomen, likely in the stomach. There is no bowel dilatation. Air is noted within the colon in the lower abdomen. Excreted contrast from recent study noted within the urinary bladder. Multiple surgical clips noted in the left upper abdomen  and in the left hemipelvis. There is osteopenia with scoliosis and degenerative changes of the spine. Advanced atherosclerotic calcification of the abdominal aorta. There is apparent 3.2 cm aneurysmal dilatation of the abdominal aorta. Evaluation of the aorta is limited on this radiograph. CT may provide better evaluation. No definite free air. There are bibasilar densities, possibly scarring. An air containing structure in the left upper abdomen may represent a hiatal hernia. IMPRESSION: 1. Enteric tube the tip in the left hemiabdomen. No bowel dilatation. 2. Advanced atherosclerotic aorta and a 3.2 cm abdominal aortic aneurysm. CT may provide better evaluation. Electronically Signed   By: Anner Crete M.D.   On: 01/05/2017 00:34     STUDIES:  CXR 11/14 > Right upper lobe and left lower lobe pneumonia superimposed on chronic disease. Cardiomegaly with pulmonary vascular  congestion and probable effusions.  CTA PE 11/14 > no PE, diffuse RUL airspace disease superimposed on emphysema CXR 11/15 > worsening diffuse bilateral airspace opacification, c/w multifocal PNA. Small L pleural effusion.  KUB 11/15 > 3.2 cm abdominal aortic aneurysm. CT may provide better evaluation. CXR 11/16 > RIJ line in distal SVC. ETT 2cm above carina. Diffuse bilateral airspace opacification may reflect pulmonary edema or pneumonia, redistributed since the prior study.  CULTURES: RVP negative Pneumocystis smear >  Tracheal aspirate 11/16 >  BCx 11/14 > Ng x 1 day  ANTIBIOTICS: Azithro 11/14 > 11/16 CTX 11/14 > 11/16 Zosyn 11/16 >  Vanc 11/15 >   SIGNIFICANT EVENTS: Intubated 11/16 after failing Bipap.   LINES/TUBES: ETT 11/16 >  CVC RIJ  11/16>  PIV LAC 11/14 >  PIV R wrist 11/15 >  Foley 11/16 >   DISCUSSION: 71 year old female with a past medical history significant for a prior renal transplant on immunosuppressive agents presented with new shortness of breath and near syncopal episode found to be  severely hypoxic requiring noninvasive positive pressure ventilation with no success, sats not greater than 84% PCCM consulted is now intubated on mechanical ventilation.   ASSESSMENT / PLAN:  PULMONARY Acute Hypoxic respiratory failure with near syncopal episode After reviewing scans combination of multilobar PNA, fluid overload, possible PHTN and underlying ILD. Likely PNA more than fluid overload, continue antibiotics Change from BiVent back to [facility], wean as able with gas BNP >2000 abx as below  CARDIOVASCULAR New A. fib with RVR  Pulm HTN PA peak pressures 39 on Cardizem Most likely secondary to hypoxia Septic shock- secondary to multilobar PNA Map goal greater than 60 (currently on levophed) On chronic prednisone- starting stress dose steroids CVC in RIJ Transduce CVP  Pt's BNP >2000 ECHO noted 60-65% with no evidence of systolic dysfunction  RENAL H/o renal transplant in 1984 On azathioprine and cyclosporine for immunosuppression Baseline Cr Correct electrolytes as needed Indwelling Foley catheter-  Diuresis based on CVP and MAP  GASTROINTESTINAL NPO OGT placed Nutrition to assess for TF PPI   HEMATOLOGIC If Hgb<7 transfuse PRBCs No signs of active bleeding Anticoagulation Continue Eliquis in setting of Afib RVR  elevated CHADS2VASC DVT PPx-> SCDs  INFECTIOUS Multilobar pneumonia in setting of immunosuppressed pt. RVP negative. LDH elevated. On azathioprine and cyclosporine as an outpatient Previously on Bactrim prophylaxis in the past Cultures pending  Continue Vancomycin and Zosyn PCT Trend WBC and fever curve  ENDOCRINE H/o insulin dependence, also on stress dose steroids. Cortisol level appropriate. TSH appropriate. Resistant sliding scale, add lantus 10U   NEUROLOGIC Encephalopathy secondary to sedation Sedated with fentanyl  RASS goal of 0--1  FAMILY  - Updates: daughter updated at bedside 11/16.  - Inter-disciplinary family meet or  Palliative Care meeting due by:  11/22  Ralene Ok, MD PGY-2, [facility] 01/05/2017 7:47 AM

## 2017-01-05 NOTE — Progress Notes (Addendum)
Pharmacy Antibiotic Note  Morgan Cooper is a 71 y.o. female admitted on 01/03/2017 with pneumonia.  Pharmacy has been consulted for vancomycin and zosyn dosing. Patient previously noted to be on azithromycin, CTX, and vancomycin from 11/14>11/15.   Temperature normal, WBC 11.1 and LA 1.7. Scr 0.9 for estimated CrCl ~ 45 mL/min. Noted history of renal transplant.   Plan: Vancomycin 1g IV q24h Zosyn 3.375g IV q8hr  Vancomycin trough at SS and PRN (goal 15-20 mcg/mL) Monitor renal function, clinical picture, and culture data F/u length of therapy   Height: 5\' 5"  (165.1 cm) Weight: 111 lb 8.8 oz (50.6 kg) IBW/kg (Calculated) : 57  Temp (24hrs), Avg:98.4 F (36.9 C), Min:98 F (36.7 C), Max:99.2 F (37.3 C)  Recent Labs  Lab 01/03/17 0708 01/03/17 1004 01/03/17 1158 01/04/17 0432 01/04/17 0951  WBC 9.0  --   --  8.8 11.1*  CREATININE 0.99  --   --  0.80 0.90  LATICACIDVEN  --  1.45 1.70  --   --     Estimated Creatinine Clearance: 45.1 mL/min (by C-G formula based on SCr of 0.9 mg/dL).    Allergies  Allergen Reactions  . Amoxicillin Diarrhea    Has patient had a PCN reaction causing immediate rash, facial/tongue/throat swelling, SOB or lightheadedness with hypotension: No Has patient had a PCN reaction causing severe rash involving mucus membranes or skin necrosis: No Has patient had a PCN reaction that required hospitalization No Has patient had a PCN reaction occurring within the last 10 years: Yes If all of the above answers are "NO", then may proceed with Cephalosporin use.   . Tape Rash   Antimicrobials: 11/14 Vanc > 11/15, 11/16 >>  11/14 CTX > 11/15  11/14 Azithromycin > 11/15 11/16 Zosyn >>   Microbiology:  11/14 MRSA PCR: neg  11/14 blood cx: pending   Lavonda Jumbo, PharmD Clinical Pharmacist 01/05/17 1:06 AM

## 2017-01-05 NOTE — Progress Notes (Signed)
Inpatient Diabetes Program Recommendations  AACE/ADA: New Consensus Statement on Inpatient Glycemic Control (2015)  Target Ranges:  Prepandial:   less than 140 mg/dL      Peak postprandial:   less than 180 mg/dL (1-2 hours)      Critically ill patients:  140 - 180 mg/dL   Results for TYLYNN, BRANIFF (MRN [id number]) as of 01/05/2017 08:54  Ref. Range 01/05/2017 00:02 01/05/2017 03:53 01/05/2017 07:23  Glucose-Capillary Latest Ref Range: 65 - 99 mg/dL 197 (H) 307 (H) 243 (H)   Results for MANHATTAN, MCCUEN (MRN [id number]) as of 01/05/2017 08:54  Ref. Range 01/03/2017 07:08 01/04/2017 04:32 01/04/2017 09:51 01/05/2017 02:45  Glucose Latest Ref Range: 65 - 99 mg/dL 182 (H) 183 (H) 188 (H) 254 (H)   Results for KIARALIZ, RAFUSE (MRN [id number]) as of 01/05/2017 08:54  Ref. Range 12/07/2015 06:07  Hemoglobin A1C Latest Ref Range: 4.8 - 5.6 % 5.7 (H)   Review of Glycemic Control  Diabetes history: No Outpatient Diabetes medications: NA Current orders for Inpatient glycemic control: Novolog 2-6 units Q4H (Phase 1 of ICU glycemic control order set)  Inpatient Diabetes Program Recommendations: HgbA1C: Please consider ordering an A1C to evaluate glycemic control over the past 2-3 months. Insulin-NURSING: Please transition patient to Phase 2 of ICU Glycemic Control order set if 12PM glucose greater than 200 mg/dl.  NOTE: NURSING- Per ICU Glycemic Control Phase 1 order set:  If there is one CBG > 250 mg/dL or two subsequent CBG > 200 mg/dL, transition to Phase 2 IV insulin.  Thanks, Barnie Alderman, RN, MSN, CDE Diabetes Coordinator Inpatient Diabetes Program [phone] (Team Pager from 8am to 5pm)

## 2017-01-05 NOTE — Progress Notes (Signed)
Vent changed made by MD at approx. 0945, with new vent orders.

## 2017-01-05 NOTE — Consult Note (Signed)
.. ..  Name: Morgan Cooper MRN: [id number] DOB: [birth year]    ADMISSION DATE:  01/03/2017 CONSULTATION DATE: 01/04/2017  REFERRING MD :  Gardiner Barefoot, NP  CHIEF COMPLAINT: Shortness of breath  BRIEF PATIENT DESCRIPTION: 71 yr old female with PMHx of renal transplant on azathioprine and cyclosporine with acute hypoxic resp failure   SIGNIFICANT EVENTS  Hypoxic with increased work of breathing requiring intubation  STUDIES:   CT PE study IMPRESSION: 1. No evidence acute pulmonary embolism. 2. Diffuse airspace disease in the RIGHT upper lobe superimposed on centrilobular emphysema. Etiology includes pulmonary edema versus pneumonia. 3.  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 Fib presented to the ED complaining of shortness of breath and near syncope found to be hypoxic saturating in the 70s placed on NRB.  Admitted by Hospitalist found to be in A. fib RVR started on Cardizem BNP 2077 CT angiogram ruled out a PE and she was found to have a multilobar pneumonia.   Sats 84-89% despite NIPPV PCCM consulted for Acute hypoxic respiratory failure in the setting of multilobar pneumonia.    PAST MEDICAL HISTORY :   has a past medical history of Anxiety, Arthritis, Barrett's esophagus, Bone pain, Bruises easily, Cancer ([place]), CHF (congestive heart failure) ([place]), Chills, CVA (cerebral infarction), Depression, Disturbed concentration, Diverticulitis of large intestine with perforation (Hx colectomy), Esophageal stricture, Fatigue, Forgetfulness, Gastroparesis, Generalized headaches, GERD (gastroesophageal reflux disease), Heart failure, diastolic, chronic ([place]), History of hiatal hernia, cardiovascular stress test, Hyperlipemia, Hypertension, Irritable, Itch, Keratosis,  Macular degeneration, Nausea, Osteoporosis, Pancreatic cyst, Pancreatic mass, Panic attacks, Persistent headaches, Pulmonary hypertension ([place]), Rapid heart rate, Renal failure, Sleep apnea, Sleep difficulties, Stroke ([place]), and TIA (transient ischemic attack).  has a past surgical history that includes Kidney transplant (1984); Appendectomy; Colon resection (2010); Colostomy (2011); colostomy reversal; left arm surgery; cataract surgery bilateral; Esophageal dilation; Parathyroidectomy (11/04/2010); Pancreatectomy (N/A, 12/07/2015); ir generic historical (01/06/2016); ir generic historical (01/20/2016); ir generic historical (02/03/2016); ir generic historical (02/24/2016); ir generic historical (03/23/2016); ir generic historical (04/06/2016); ir generic historical (04/20/2016); IR Radiologist Eval & Mgmt (05/23/2016); and IR Radiologist Eval & Mgmt (05/03/2016). Prior to Admission medications   Medication Sig Start Date End Date Taking? Authorizing Provider  apixaban (ELIQUIS) 5 MG TABS tablet Take 1 tablet (5 mg total) 2 (two) times daily by mouth. 12/28/16  Yes Jettie Booze, MD  atorvastatin (LIPITOR) 20 MG tablet Take 20 mg by mouth daily.    Yes [provider]  azaTHIOprine (IMURAN) 50 MG tablet Take 50 mg by mouth daily.    Yes [provider]  cycloSPORINE (RESTASIS) 0.05 % ophthalmic emulsion Place 1 drop into both eyes 2 (two) times daily.     Yes [provider]  HYDROcodone-acetaminophen (NORCO) 10-325 MG per tablet Take 1 tablet by mouth every 6 (six) hours as needed (pain).    Yes [provider]  metoprolol tartrate (LOPRESSOR) 50 MG tablet Take 2 tablets (100 mg total) 2 (two) times daily by mouth. 12/28/16  Yes Jettie Booze, MD  omeprazole (PRILOSEC) 40 MG capsule Take 40 mg 2 (two) times daily by mouth.    Yes [provider]  OXYGEN Inhale 1 L into the lungs See admin instructions. Use at bedtime and as needed during activity   Yes  [provider]  predniSONE (DELTASONE)  5 MG tablet Take 7.5 mg by mouth daily with breakfast.  05/04/15  Yes [provider]  QUEtiapine (SEROQUEL) 200 MG tablet Take 200 mg by mouth at bedtime.    Yes [provider]  trimethoprim (TRIMPEX) 100 MG tablet Take 1 tablet (100 mg total) by mouth at bedtime. continuous 01/06/16  Yes Stark Klein, MD  amLODipine (NORVASC) 5 MG tablet Take 1 tablet (5 mg total) by mouth daily. 07/11/16 10/09/16  Jettie Booze, MD  FLUoxetine (PROZAC) 20 MG capsule Take 60 mg by mouth daily.     [provider]  ondansetron (ZOFRAN) 4 MG tablet Take 1 tablet (4 mg total) by mouth every 8 (eight) hours as needed for nausea or vomiting. Patient not taking: Reported on 08/15/2016 06/02/15   Mauri Pole, MD   Allergies  Allergen Reactions  . Amoxicillin Diarrhea    Has patient had a PCN reaction causing immediate rash, facial/tongue/throat swelling, SOB or lightheadedness with hypotension: No Has patient had a PCN reaction causing severe rash involving mucus membranes or skin necrosis: No Has patient had a PCN reaction that required hospitalization No Has patient had a PCN reaction occurring within the last 10 years: Yes If all of the above answers are "NO", then may proceed with Cephalosporin use.   . Tape Rash    FAMILY HISTORY:  family history includes Aneurysm in her brother; Cancer in her unknown relative; Colon cancer (age of onset: 11) in her maternal aunt; Heart disease in her mother; Lung cancer in her sister; Stroke in her father. SOCIAL HISTORY:  reports that she quit smoking about 27 years ago. Her smoking use included cigarettes. She has a 5.00 pack-year smoking history. she has never used smokeless tobacco. She reports that she does not drink alcohol or use drugs.  REVIEW OF SYSTEMS:  Bolded items are positive ( asked prior to intubation) Constitutional: Negative for fever, chills, weight loss,  malaise/fatigue and diaphoresis.  HENT: Negative for hearing loss, ear pain, nosebleeds, congestion, sore throat, neck pain, tinnitus and ear discharge.   Eyes: Negative for blurred vision, double vision, photophobia, pain, discharge and redness.  Respiratory: Negative for cough, hemoptysis, sputum production, shortness of breath, wheezing and stridor.   Cardiovascular: Negative for chest pain, palpitations, orthopnea, claudication, leg swelling and PND.  Gastrointestinal: Negative for heartburn, nausea, vomiting, abdominal pain, diarrhea, constipation, blood in stool and melena.  Genitourinary: Negative for dysuria, urgency, frequency, hematuria and flank pain.  Musculoskeletal: Negative for myalgias, back pain, joint pain and falls.  Skin: Negative for itching and rash.  Neurological: Negative for dizziness, tingling, tremors, sensory change, speech change, focal weakness, seizures, loss of consciousness, weakness and headaches.  Endo/Heme/Allergies: Negative for environmental allergies and polydipsia. Does not bruise/bleed easily.  SUBJECTIVE:   VITAL SIGNS: Temp:  [98 F (36.7 C)-99.2 F (37.3 C)] 98.4 F (36.9 C) (11/16 0003) Pulse Rate:  [88-124] 124 (11/15 2016) Resp:  [27-31] 29 (11/15 2016) BP: (84-106)/(58-67) 106/67 (11/15 1325) SpO2:  [89 %-96 %] 94 % (11/15 2331) FiO2 (%):  [60 %-100 %] 100 % (11/15 2331)  PHYSICAL EXAMINATION: General: Thin cachectic female Neuro: No focal deficits. positive gag corneal extraocular movements intact. now sedated  HEENT: Normocephalic atraumatic ETTin oropharynx Cardiovascular: S1-S2 irregularly irregular rate greater than 120 Lungs: Coarse breath sounds bilaterally worse on the right Abdomen: Soft positive bowel sounds no distention Musculoskeletal: No gross deformities no edema Skin: Grossly intact  Recent Labs  Lab 01/03/17 0708 01/04/17 0432 01/04/17 0951  NA 135 135 136  K 4.3 3.4* 4.1  CL 104 99* 102  CO2 22 23 25   BUN 10  7 11   CREATININE 0.99 0.80 0.90  GLUCOSE 182* 183* 188*   Recent Labs  Lab 01/03/17 0708 01/04/17 0432 01/04/17 0951  HGB 11.3* 11.6* 11.5*  HCT 34.7* 35.7* 33.8*  WBC 9.0 8.8 11.1*  PLT 197 218 166   Ct Angio Chest Pe W And/or Wo Contrast  Result Date: 01/03/2017 CLINICAL DATA:  CVA, hypertension, renal transplant EXAM: CT ANGIOGRAPHY CHEST WITH CONTRAST TECHNIQUE: Multidetector CT imaging of the chest was performed using the standard protocol during bolus administration of intravenous contrast. Multiplanar CT image reconstructions and MIPs were obtained to evaluate the vascular anatomy. CONTRAST:  <See Chart> ISOVUE-370 IOPAMIDOL (ISOVUE-370) INJECTION 76% COMPARISON:  None. FINDINGS: Cardiovascular: No filling defects within the pulmonary arteries arteries to suggest acute pulmonary embolism. No acute findings of the aorta. Calcification of the aorta and the coronary arteries. No pericardial effusion Mediastinum/Nodes: No axillary or supraclavicular adenopathy. No mediastinal hilar adenopathy. Lungs/Pleura: There is RIGHT upper lobe airspace disease superimposed on centrilobular emphysema. Mild airspace disease in the RIGHT lower lobe and small RIGHT effusion. Upper Abdomen: Limited view of the liver, kidneys, pancreas are unremarkable. Normal adrenal glands. Musculoskeletal: No aggressive osseous lesion. Remote sternal fracture. Review of the MIP images confirms the above findings. IMPRESSION: 1. No evidence acute pulmonary embolism. 2. Diffuse airspace disease in the RIGHT upper lobe superimposed on centrilobular emphysema. Etiology includes pulmonary edema versus pneumonia. 3.  Coronary artery calcification Aortic Atherosclerosis (ICD10-I70.0) and Emphysema (ICD10-J43.9). Electronically Signed   By: Suzy Bouchard M.D.   On: 01/03/2017 09:39   Dg Chest Port 1 View  Result Date: 01/03/2017 CLINICAL DATA:  Shortness of breath. Weakness for 2 weeks. Dry cough. EXAM: PORTABLE CHEST 1 VIEW  COMPARISON:  Two-view chest x-ray a 02/17/2016. FINDINGS: The heart is enlarged. Aortic atherosclerosis is again seen. Right upper and left lower lobe airspace disease is superimposed on chronic interstitial coarsening. Pleural effusions are not excluded. The visualized soft tissues and bony thorax are unremarkable. IMPRESSION: 1. Right upper lobe and left lower lobe pneumonia superimposed on chronic disease. 2. Cardiomegaly with pulmonary vascular congestion and probable effusions. An element of congestive heart failure is likely also present. 3. Aortic atherosclerosis. Electronically Signed   By: San Morelle M.D.   On: 01/03/2017 07:29    ASSESSMENT / PLAN: 71 year old female with a past medical history significant for a prior renal transplant on immunosuppressive agents presented with new shortness of breath and near syncopal episode found to be severely hypoxic requiring noninvasive positive pressure ventilation with no success, sats not greater than 84% PCCM consulted is now intubated on mechanical ventilation.   NEURO: Encephalopathy secondary to sedation GCS prior to intubation 15 Now sedated with continuous fentanyl intermittent pushes of Versed RASS goal of 0--1  CARDIAC: New A. fib with RVR  on Cardizem Most likely secondary to hypoxia Septic shock- secondary to multilobar PNA Map goal greater than 60 Vasopressors On chronic prednisone- starting stress dose steroids CVC in RIJ Transduce CVP  Pt's BNP >2000 ECHO noted 60-65% with no evidence of systolic dysfunction Pulm HTN PA peak pressures 39   PULMONARY: Acute Hypoxic respiratory failure with near syncopal episode CT Angio - PE After reviewing scans combination of multilobar PNA, fluid overload, possible PHTN and underlying ILD. Seen as an outpatient: mixed obstruction/restricition defect on last PFTs Former smoker  ANA + and Autoimmune workup was positive  Intubated initially on PRVC Still desaturating even  with PEEP 12 APRV 35/15 with Thigh 5 T low 1 ABG post change May benefit from some diuresis- will assess volume status BNP >2000  ID: Multilobar pneumonia in setting of immunosuppressed pt On azathioprine and cyclosporine as an outpatient Previously on Bactrim prophylaxis in the past Sending LDH given A-a gradient Cultures pending  RVP sent Broaden antibiotics to Vancomycin and Zosyn PCT Trend WBC and fever curve   Endocrine: TSH H/o insulin dependence Cortiosl level  GI: NPO OGT placed Nutrition to assess for TF PPI    Heme: If Hgb<7 transfuse PRBCs .Marland KitchenAB POS No signs of active bleeding Anticoagulation Continue Eliquis in setting of Afib RVR  elevated CHADS2VASC DVT PPx-> SCDs  RENAL H/o renal transplant in 1984 On azathioprine and cyclosporine for immunosuppression Baseline Cr Correct electrolytes as needed Indwelling Foley catheter-  Diuresis based on CVP and MAP Lab Results  Component Value Date   CREATININE 0.90 01/04/2017   CREATININE 0.80 01/04/2017   CREATININE 0.99 01/03/2017      I, Dr Seward Carol have personally reviewed patient's available data, including medical history, events of note, physical examination and test results as part of my evaluation. I have discussed with NP Dewaine Oats and other care providers such as pharmacist, RN and RT. The patient is critically ill with multiple organ systems failure and requires high complexity decision making for assessment and support, frequent evaluation and titration of therapies, application of advanced monitoring technologies and extensive interpretation of multiple databases. Critical Care Time devoted to patient care services described in this note is 65 Minutes. This time reflects time of care of this signee Dr Seward Carol. This critical care time does not reflect procedure time, or teaching time or supervisory time of PA/NP/Med student/Med Resident etc but could involve care discussion time     DISPOSITION: ICU CC TIME: 65 mins PROGNOSIS: Guarded FAMILY: not at bedside CODE STATUS: Full   Signed Dr Seward Carol Pulmonary Critical Care Locums Pulmonary and [place] Pager: [phone]  01/05/2017, 12:24 AM

## 2017-01-05 NOTE — Progress Notes (Signed)
Notification sent to NP. Pt is tachypnic and retracting on bipap, respiration 40-40, 02 flexing 78-90%, FIO2 100%, HR 120's Afib. The pt is alert and oriented, states she doesn't feel well. Will continue to monitor closely .

## 2017-01-05 NOTE — Progress Notes (Signed)
DAILY PROGRESS NOTE   Patient Name: Morgan Cooper Date of Encounter: 01/05/2017  Chief Complaint   Intubated, sedated  Patient Profile   71 yo patient of Dr. Irish Lack with new onset a-fib with RVR, possible underlying PNA and likely CHF.  Subjective   Transferred to ICU since yesterday. Currently in afib in the 80's. On levophed. High PEEP noted on vent.   Objective   Vitals:   01/05/17 0400 01/05/17 0500 01/05/17 0600 01/05/17 0725  BP: (!) 87/67 94/67 90/65    Pulse: 95 98 95   Resp: 10 10 10    Temp:    97.6 F (36.4 C)  TempSrc:    Oral  SpO2: 96% 100% 100%   Weight: 110 lb 10.7 oz (50.2 kg)     Height:        Intake/Output Summary (Last 24 hours) at 01/05/2017 0750 Last data filed at 01/05/2017 0600 Gross per 24 hour  Intake 578.3 ml  Output 500 ml  Net 78.3 ml   Filed Weights   01/03/17 2017 01/05/17 0400  Weight: 111 lb 8.8 oz (50.6 kg) 110 lb 10.7 oz (50.2 kg)    Physical Exam   General appearance: sedated on vent, intubated Neck: no carotid bruit, no JVD and thyroid not enlarged, symmetric, no tenderness/mass/nodules Lungs: diminished breath sounds bilaterally and rhonchi bilaterally Heart: irregularly irregular rhythm Abdomen: soft, non-tender; bowel sounds normal; no masses,  no organomegaly Extremities: extremities normal, atraumatic, no cyanosis or edema Neurologic: Mental status: Intubated, sedated on vent  Inpatient Medications    Scheduled Meds: . apixaban  5 mg Per Tube BID  . atorvastatin  20 mg Per Tube Daily  . azaTHIOprine  50 mg Oral Daily  . chlorhexidine gluconate (MEDLINE KIT)  15 mL Mouth Rinse BID  . cycloSPORINE  1 drop Both Eyes BID  . hydrocortisone sod succinate (SOLU-CORTEF) inj  50 mg Intravenous Q6H  . insulin aspart  2-6 Units Subcutaneous Q4H  . ipratropium  0.5 mg Nebulization Q6H  . levalbuterol  0.63 mg Nebulization Q6H  . LORazepam      . mouth rinse  15 mL Mouth Rinse QID  . pantoprazole (PROTONIX) IV  40  mg Intravenous Q24H    Continuous Infusions: . sodium chloride    . diltiazem (CARDIZEM) infusion 10 mg/hr (01/05/17 0353)  . fentaNYL infusion INTRAVENOUS Stopped (01/05/17 0741)  . norepinephrine (LEVOPHED) Adult infusion 12 mcg/min (01/05/17 0630)  . piperacillin-tazobactam (ZOSYN)  IV 3.375 g (01/05/17 0404)  . vancomycin Stopped (01/05/17 0359)    PRN Meds: Place/Maintain arterial line **AND** sodium chloride, docusate, fentaNYL, levalbuterol, midazolam, midazolam, ondansetron (ZOFRAN) IV, ondansetron   Labs   Results for orders placed or performed during the hospital encounter of 01/03/17 (from the past 48 hour(s))  Blood culture (routine x 2)     Status: None (Preliminary result)   Collection Time: 01/03/17  9:54 AM  Result Value Ref Range   Specimen Description BLOOD LEFT ARM    Special Requests      BOTTLES DRAWN AEROBIC AND ANAEROBIC Blood Culture adequate volume   Culture NO GROWTH 1 DAY    Report Status PENDING   I-Stat CG4 Lactic Acid, ED     Status: None   Collection Time: 01/03/17 10:04 AM  Result Value Ref Range   Lactic Acid, Venous 1.45 0.5 - 1.9 mmol/L  HIV antibody     Status: None   Collection Time: 01/03/17 10:44 AM  Result Value Ref Range   HIV  Screen 4th Generation wRfx Non Reactive Non Reactive    Comment: (NOTE) Performed At: [facility] [place], [place] [id number] Rush Farmer MD YY:[id number]   Legionella Pneumophila Serogp 1 Ur Ag     Status: None   Collection Time: 01/03/17 10:44 AM  Result Value Ref Range   L. pneumophila Serogp 1 Ur Ag Negative Negative    Comment: (NOTE) Presumptive negative for L. pneumophila serogroup 1 antigen in urine, suggesting no recent or current infection. Legionnaires' disease cannot be ruled out since other serogroups and species may also cause disease. Performed At: [facility] [place], [place] [id number] Rush Farmer MD UD:[id number]    Source of  Sample URINE, RANDOM   Strep pneumoniae urinary antigen     Status: None   Collection Time: 01/03/17 11:00 AM  Result Value Ref Range   Strep Pneumo Urinary Antigen NEGATIVE NEGATIVE    Comment:        Infection due to S. pneumoniae cannot be absolutely ruled out since the antigen present may be below the detection limit of the test.   Influenza panel by PCR (type A & B)     Status: None   Collection Time: 01/03/17 11:13 AM  Result Value Ref Range   Influenza A By PCR NEGATIVE NEGATIVE   Influenza B By PCR NEGATIVE NEGATIVE    Comment: (NOTE) The Xpert Xpress Flu assay is intended as an aid in the diagnosis of  influenza and should not be used as a sole basis for treatment.  This  assay is FDA approved for nasopharyngeal swab specimens only. Nasal  washings and aspirates are unacceptable for Xpert Xpress Flu testing.   I-Stat CG4 Lactic Acid, ED     Status: None   Collection Time: 01/03/17 11:58 AM  Result Value Ref Range   Lactic Acid, Venous 1.70 0.5 - 1.9 mmol/L  MRSA PCR Screening     Status: None   Collection Time: 01/03/17  9:29 PM  Result Value Ref Range   MRSA by PCR NEGATIVE NEGATIVE    Comment:        The GeneXpert MRSA Assay (FDA approved for NASAL specimens only), is one component of a comprehensive MRSA colonization surveillance program. It is not intended to diagnose MRSA infection nor to guide or monitor treatment for MRSA infections.   Basic metabolic panel     Status: Abnormal   Collection Time: 01/04/17  4:32 AM  Result Value Ref Range   Sodium 135 135 - 145 mmol/L   Potassium 3.4 (L) 3.5 - 5.1 mmol/L   Chloride 99 (L) 101 - 111 mmol/L   CO2 23 22 - 32 mmol/L   Glucose, Bld 183 (H) 65 - 99 mg/dL   BUN 7 6 - 20 mg/dL   Creatinine, Ser 0.80 0.44 - 1.00 mg/dL   Calcium 8.7 (L) 8.9 - 10.3 mg/dL   GFR calc non Af Amer >60 >60 mL/min   GFR calc Af Amer >60 >60 mL/min    Comment: (NOTE) The eGFR has been calculated using the CKD EPI equation. This  calculation has not been validated in all clinical situations. eGFR's persistently <60 mL/min signify possible Chronic Kidney Disease.    Anion gap 13 5 - 15  CBC     Status: Abnormal   Collection Time: 01/04/17  4:32 AM  Result Value Ref Range   WBC 8.8 4.0 - 10.5 K/uL   RBC 3.72 (L) 3.87 - 5.11 MIL/uL  Hemoglobin 11.6 (L) 12.0 - 15.0 g/dL   HCT 35.7 (L) 36.0 - 46.0 %   MCV 96.0 78.0 - 100.0 fL   MCH 31.2 26.0 - 34.0 pg   MCHC 32.5 30.0 - 36.0 g/dL   RDW 14.8 11.5 - 15.5 %   Platelets 218 150 - 400 K/uL  CBC with Differential/Platelet     Status: Abnormal   Collection Time: 01/04/17  9:51 AM  Result Value Ref Range   WBC 11.1 (H) 4.0 - 10.5 K/uL   RBC 3.61 (L) 3.87 - 5.11 MIL/uL   Hemoglobin 11.5 (L) 12.0 - 15.0 g/dL   HCT 33.8 (L) 36.0 - 46.0 %   MCV 93.6 78.0 - 100.0 fL   MCH 31.9 26.0 - 34.0 pg   MCHC 34.0 30.0 - 36.0 g/dL   RDW 14.8 11.5 - 15.5 %   Platelets 166 150 - 400 K/uL   Neutrophils Relative % 82 %   Neutro Abs 9.1 (H) 1.7 - 7.7 K/uL   Lymphocytes Relative 9 %   Lymphs Abs 0.9 0.7 - 4.0 K/uL   Monocytes Relative 9 %   Monocytes Absolute 1.0 0.1 - 1.0 K/uL   Eosinophils Relative 0 %   Eosinophils Absolute 0.0 0.0 - 0.7 K/uL   Basophils Relative 0 %   Basophils Absolute 0.0 0.0 - 0.1 K/uL  Comprehensive metabolic panel     Status: Abnormal   Collection Time: 01/04/17  9:51 AM  Result Value Ref Range   Sodium 136 135 - 145 mmol/L   Potassium 4.1 3.5 - 5.1 mmol/L   Chloride 102 101 - 111 mmol/L   CO2 25 22 - 32 mmol/L   Glucose, Bld 188 (H) 65 - 99 mg/dL   BUN 11 6 - 20 mg/dL   Creatinine, Ser 0.90 0.44 - 1.00 mg/dL   Calcium 8.7 (L) 8.9 - 10.3 mg/dL   Total Protein 6.5 6.5 - 8.1 g/dL   Albumin 3.0 (L) 3.5 - 5.0 g/dL   AST 29 15 - 41 U/L   ALT 15 14 - 54 U/L   Alkaline Phosphatase 59 38 - 126 U/L   Total Bilirubin 1.5 (H) 0.3 - 1.2 mg/dL   GFR calc non Af Amer >60 >60 mL/min   GFR calc Af Amer >60 >60 mL/min    Comment: (NOTE) The eGFR has been  calculated using the CKD EPI equation. This calculation has not been validated in all clinical situations. eGFR's persistently <60 mL/min signify possible Chronic Kidney Disease.    Anion gap 9 5 - 15  Blood gas, arterial     Status: Abnormal   Collection Time: 01/04/17 10:02 AM  Result Value Ref Range   FIO2 100.00    Delivery systems BILEVEL POSITIVE AIRWAY PRESSURE    Mode SYNCRONIZED INTERMITTENT MANDATORY VENTILATION    LHR 8 resp/min   Inspiratory PAP 12.0    Expiratory PAP 6.0    pH, Arterial 7.468 (H) 7.350 - 7.450   pCO2 arterial 35.8 32.0 - 48.0 mmHg   pO2, Arterial 80.4 (L) 83.0 - 108.0 mmHg   Bicarbonate 25.6 20.0 - 28.0 mmol/L   Acid-Base Excess 2.1 (H) 0.0 - 2.0 mmol/L   O2 Saturation 96.1 %   Patient temperature 98.6    Collection site LEFT RADIAL    Drawn by [id number]    Sample type ARTERIAL DRAW    Allens test (pass/fail) PASS PASS  Blood gas, arterial     Status: Abnormal   Collection Time: 01/04/17  10:40 PM  Result Value Ref Range   FIO2 1.00    Delivery systems BILEVEL POSITIVE AIRWAY PRESSURE    Inspiratory PAP 12.0    Expiratory PAP 6.0    pH, Arterial 7.436 7.350 - 7.450   pCO2 arterial 38.2 32.0 - 48.0 mmHg   pO2, Arterial 50.5 (L) 83.0 - 108.0 mmHg   Bicarbonate 25.3 20.0 - 28.0 mmol/L   Acid-Base Excess 1.5 0.0 - 2.0 mmol/L   O2 Saturation 84.0 %   Patient temperature 98.6    Drawn by [id number]    Sample type ARTERIAL DRAW    Allens test (pass/fail) PASS PASS  Respiratory Panel by PCR     Status: None   Collection Time: 01/04/17 11:45 PM  Result Value Ref Range   Adenovirus NOT DETECTED NOT DETECTED   Coronavirus 229E NOT DETECTED NOT DETECTED   Coronavirus HKU1 NOT DETECTED NOT DETECTED   Coronavirus NL63 NOT DETECTED NOT DETECTED   Coronavirus OC43 NOT DETECTED NOT DETECTED   Metapneumovirus NOT DETECTED NOT DETECTED   Rhinovirus / Enterovirus NOT DETECTED NOT DETECTED   Influenza A NOT DETECTED NOT DETECTED   Influenza B NOT DETECTED NOT  DETECTED   Parainfluenza Virus 1 NOT DETECTED NOT DETECTED   Parainfluenza Virus 2 NOT DETECTED NOT DETECTED   Parainfluenza Virus 3 NOT DETECTED NOT DETECTED   Parainfluenza Virus 4 NOT DETECTED NOT DETECTED   Respiratory Syncytial Virus NOT DETECTED NOT DETECTED   Bordetella pertussis NOT DETECTED NOT DETECTED   Chlamydophila pneumoniae NOT DETECTED NOT DETECTED   Mycoplasma pneumoniae NOT DETECTED NOT DETECTED  Glucose, capillary     Status: Abnormal   Collection Time: 01/05/17 12:02 AM  Result Value Ref Range   Glucose-Capillary 197 (H) 65 - 99 mg/dL   Comment 1 Capillary Specimen   Culture, respiratory (NON-Expectorated)     Status: None (Preliminary result)   Collection Time: 01/05/17 12:49 AM  Result Value Ref Range   Specimen Description TRACHEAL ASPIRATE    Special Requests NONE    Gram Stain      FEW WBC PRESENT,BOTH PMN AND MONONUCLEAR NO ORGANISMS SEEN    Culture PENDING    Report Status PENDING   Blood gas, arterial     Status: Abnormal   Collection Time: 01/05/17  1:35 AM  Result Value Ref Range   FIO2 1.00    Delivery systems VENTILATOR    Mode BIVENT    Peep/cpap HIGH PEEP 35 LOW PEEP 15 cm H20   PIP HIGH TIME 5 LOW TIME 1 cm H2O   pH, Arterial 7.302 (L) 7.350 - 7.450   pCO2 arterial 47.4 32.0 - 48.0 mmHg   pO2, Arterial 350 (H) 83.0 - 108.0 mmHg   Bicarbonate 22.7 20.0 - 28.0 mmol/L   Acid-base deficit 2.7 (H) 0.0 - 2.0 mmol/L   O2 Saturation 99.3 %   Patient temperature 98.6    Collection site LEFT RADIAL    Drawn by [id number]    Sample type ARTERIAL DRAW    Allens test (pass/fail) PASS PASS  CBC with Differential/Platelet     Status: Abnormal   Collection Time: 01/05/17  2:45 AM  Result Value Ref Range   WBC 14.2 (H) 4.0 - 10.5 K/uL   RBC 3.56 (L) 3.87 - 5.11 MIL/uL   Hemoglobin 11.1 (L) 12.0 - 15.0 g/dL   HCT 33.7 (L) 36.0 - 46.0 %   MCV 94.7 78.0 - 100.0 fL   MCH 31.2 26.0 - 34.0 pg  MCHC 32.9 30.0 - 36.0 g/dL   RDW 14.5 11.5 - 15.5 %    Platelets 257 150 - 400 K/uL   Neutrophils Relative % 88 %   Neutro Abs 12.5 (H) 1.7 - 7.7 K/uL   Lymphocytes Relative 6 %   Lymphs Abs 0.8 0.7 - 4.0 K/uL   Monocytes Relative 6 %   Monocytes Absolute 0.8 0.1 - 1.0 K/uL   Eosinophils Relative 0 %   Eosinophils Absolute 0.0 0.0 - 0.7 K/uL   Basophils Relative 0 %   Basophils Absolute 0.0 0.0 - 0.1 K/uL  Basic metabolic panel     Status: Abnormal   Collection Time: 01/05/17  2:45 AM  Result Value Ref Range   Sodium 136 135 - 145 mmol/L   Potassium 3.6 3.5 - 5.1 mmol/L   Chloride 103 101 - 111 mmol/L   CO2 24 22 - 32 mmol/L   Glucose, Bld 254 (H) 65 - 99 mg/dL   BUN 14 6 - 20 mg/dL   Creatinine, Ser 0.95 0.44 - 1.00 mg/dL   Calcium 8.4 (L) 8.9 - 10.3 mg/dL   GFR calc non Af Amer 58 (L) >60 mL/min   GFR calc Af Amer >60 >60 mL/min    Comment: (NOTE) The eGFR has been calculated using the CKD EPI equation. This calculation has not been validated in all clinical situations. eGFR's persistently <60 mL/min signify possible Chronic Kidney Disease.    Anion gap 9 5 - 15  Magnesium     Status: None   Collection Time: 01/05/17  2:45 AM  Result Value Ref Range   Magnesium 1.9 1.7 - 2.4 mg/dL  Phosphorus     Status: None   Collection Time: 01/05/17  2:45 AM  Result Value Ref Range   Phosphorus 3.5 2.5 - 4.6 mg/dL  Procalcitonin - Baseline     Status: None   Collection Time: 01/05/17  2:45 AM  Result Value Ref Range   Procalcitonin 0.52 ng/mL    Comment:        Interpretation: PCT > 0.5 ng/mL and <= 2 ng/mL: Systemic infection (sepsis) is possible, but other conditions are known to elevate PCT as well. (NOTE)         ICU PCT Algorithm               Non ICU PCT Algorithm    ----------------------------     ------------------------------         PCT < 0.25 ng/mL                 PCT < 0.1 ng/mL     Stopping of antibiotics            Stopping of antibiotics       strongly encouraged.               strongly encouraged.     ----------------------------     ------------------------------       PCT level decrease by               PCT < 0.25 ng/mL       >= 80% from peak PCT       OR PCT 0.25 - 0.5 ng/mL          Stopping of antibiotics  encouraged.     Stopping of antibiotics           encouraged.    ----------------------------     ------------------------------       PCT level decrease by              PCT >= 0.25 ng/mL       < 80% from peak PCT        AND PCT >= 0.5 ng/mL             Continuing antibiotics                                              encouraged.       Continuing antibiotics            encouraged.    ----------------------------     ------------------------------     PCT level increase compared          PCT > 0.5 ng/mL         with peak PCT AND          PCT >= 0.5 ng/mL             Escalation of antibiotics                                          strongly encouraged.      Escalation of antibiotics        strongly encouraged.   Lactate dehydrogenase     Status: Abnormal   Collection Time: 01/05/17  2:45 AM  Result Value Ref Range   LDH 305 (H) 98 - 192 U/L  TSH     Status: None   Collection Time: 01/05/17  2:45 AM  Result Value Ref Range   TSH 1.625 0.350 - 4.500 uIU/mL    Comment: Performed by a 3rd Generation assay with a functional sensitivity of <=0.01 uIU/mL.  Cortisol     Status: None   Collection Time: 01/05/17  2:45 AM  Result Value Ref Range   Cortisol, Plasma 46.4 ug/dL    Comment: (NOTE) AM    6.7 - 22.6 ug/dL PM   <10.0       ug/dL   Glucose, capillary     Status: Abnormal   Collection Time: 01/05/17  3:53 AM  Result Value Ref Range   Glucose-Capillary 307 (H) 65 - 99 mg/dL   Comment 1 Capillary Specimen    Comment 2 Notify RN   Blood gas, arterial     Status: Abnormal   Collection Time: 01/05/17  4:35 AM  Result Value Ref Range   FIO2 70.00    Delivery systems VENTILATOR    Mode BIVENT    Peep/cpap HIGH PEEP 35 LOW  PEEP15 cm H20    Comment: HIGH TIME 5.0 LOW TIME 1.0   pH, Arterial 7.339 (L) 7.350 - 7.450   pCO2 arterial 44.1 32.0 - 48.0 mmHg   pO2, Arterial 244 (H) 83.0 - 108.0 mmHg   Bicarbonate 23.1 20.0 - 28.0 mmol/L   Acid-base deficit 1.8 0.0 - 2.0 mmol/L   O2 Saturation 99.3 %   Patient temperature 98.6    Collection site A-LINE    Drawn by [id number]    Sample type ARTERIAL DRAW  Allens test (pass/fail) PASS PASS  Glucose, capillary     Status: Abnormal   Collection Time: 01/05/17  7:23 AM  Result Value Ref Range   Glucose-Capillary 243 (H) 65 - 99 mg/dL    ECG   N/A  Telemetry   A-fib with CVR - Personally Reviewed  Radiology    Ct Angio Chest Pe W And/or Wo Contrast  Result Date: 01/03/2017 CLINICAL DATA:  CVA, hypertension, renal transplant EXAM: CT ANGIOGRAPHY CHEST WITH CONTRAST TECHNIQUE: Multidetector CT imaging of the chest was performed using the standard protocol during bolus administration of intravenous contrast. Multiplanar CT image reconstructions and MIPs were obtained to evaluate the vascular anatomy. CONTRAST:  <See Chart> ISOVUE-370 IOPAMIDOL (ISOVUE-370) INJECTION 76% COMPARISON:  None. FINDINGS: Cardiovascular: No filling defects within the pulmonary arteries arteries to suggest acute pulmonary embolism. No acute findings of the aorta. Calcification of the aorta and the coronary arteries. No pericardial effusion Mediastinum/Nodes: No axillary or supraclavicular adenopathy. No mediastinal hilar adenopathy. Lungs/Pleura: There is RIGHT upper lobe airspace disease superimposed on centrilobular emphysema. Mild airspace disease in the RIGHT lower lobe and small RIGHT effusion. Upper Abdomen: Limited view of the liver, kidneys, pancreas are unremarkable. Normal adrenal glands. Musculoskeletal: No aggressive osseous lesion. Remote sternal fracture. Review of the MIP images confirms the above findings. IMPRESSION: 1. No evidence acute pulmonary embolism. 2. Diffuse airspace  disease in the RIGHT upper lobe superimposed on centrilobular emphysema. Etiology includes pulmonary edema versus pneumonia. 3.  Coronary artery calcification Aortic Atherosclerosis (ICD10-I70.0) and Emphysema (ICD10-J43.9). Electronically Signed   By: Suzy Bouchard M.D.   On: 01/03/2017 09:39   Dg Chest Port 1 View  Result Date: 01/05/2017 CLINICAL DATA:  Central line placement. EXAM: PORTABLE CHEST 1 VIEW COMPARISON:  Chest radiograph performed 01/04/2017 FINDINGS: A right IJ line is noted ending about the distal SVC. The patient's endotracheal tube is seen ending nearly 2 cm above the carina. An enteric tube is noted extending below the diaphragm. Diffuse bilateral airspace opacification may reflect pulmonary edema or pneumonia. No definite pleural effusion or pneumothorax is seen. The cardiomediastinal silhouette is borderline enlarged. No acute osseous abnormalities are identified. IMPRESSION: 1. Right IJ line noted ending about the distal SVC. 2. Endotracheal tube noted ending nearly 2 cm above the carina. 3. Diffuse bilateral airspace opacification may reflect pulmonary edema or pneumonia, redistributed since the prior study. 4. Borderline cardiomegaly. Electronically Signed   By: Garald Balding M.D.   On: 01/05/2017 01:11   Dg Chest Port 1 View  Result Date: 01/05/2017 CLINICAL DATA:  Endotracheal tube and orogastric tube placement. EXAM: PORTABLE CHEST 1 VIEW COMPARISON:  Chest radiograph and CTA of the chest performed 01/03/2017 FINDINGS: The patient's endotracheal tube is seen ending 2 cm above the carina. An enteric tube is noted ending overlying the body of the stomach. Diffuse bilateral airspace opacification is worsened on both sides since the recent prior studies. A small left pleural effusion is noted. No pneumothorax is seen. The cardiomediastinal silhouette is borderline normal in size. No acute osseous abnormalities are seen. IMPRESSION: 1. Endotracheal tube seen ending 2 cm above  the carina. 2. Enteric tube noted ending overlying the body of the stomach. 3. Worsening diffuse bilateral airspace opacification, concerning for multifocal pneumonia. Small left pleural effusion. Electronically Signed   By: Garald Balding M.D.   On: 01/05/2017 00:33   Dg Abd Portable 1v  Result Date: 01/05/2017 CLINICAL DATA:  71 year old female status post intubation and placement of enteric tube. EXAM: PORTABLE ABDOMEN -  1 VIEW COMPARISON:  CT of the abdomen pelvis dated 05/23/2016 FINDINGS: An endotracheal tube is partially visualized with tip and side-port in the left hemiabdomen, likely in the stomach. There is no bowel dilatation. Air is noted within the colon in the lower abdomen. Excreted contrast from recent study noted within the urinary bladder. Multiple surgical clips noted in the left upper abdomen and in the left hemipelvis. There is osteopenia with scoliosis and degenerative changes of the spine. Advanced atherosclerotic calcification of the abdominal aorta. There is apparent 3.2 cm aneurysmal dilatation of the abdominal aorta. Evaluation of the aorta is limited on this radiograph. CT may provide better evaluation. No definite free air. There are bibasilar densities, possibly scarring. An air containing structure in the left upper abdomen may represent a hiatal hernia. IMPRESSION: 1. Enteric tube the tip in the left hemiabdomen. No bowel dilatation. 2. Advanced atherosclerotic aorta and a 3.2 cm abdominal aortic aneurysm. CT may provide better evaluation. Electronically Signed   By: Anner Crete M.D.   On: 01/05/2017 00:34    Cardiac Studies   LV EF: 55% -   60%  ------------------------------------------------------------------- Indications:      Atrial fibrillation - 427.31.  ------------------------------------------------------------------- History:   PMH:  History of renal transplant.  Congestive heart failure.  Stroke.  Risk factors:  Current tobacco  use. Dyslipidemia.  ------------------------------------------------------------------- Study Conclusions  - Left ventricle: The cavity size was normal. Systolic function was   normal. The estimated ejection fraction was in the range of 55%   to 60%. - Aortic valve: There was trivial regurgitation. - Left atrium: The atrium was moderately dilated. - Right ventricle: The cavity size was mildly dilated. Wall   thickness was normal. - Right atrium: The atrium was moderately dilated. - Pulmonary arteries: PA peak pressure: 39 mm Hg (S).  Assessment   Principal Problem:   Atrial fibrillation with RVR (HCC) Active Problems:   Chronic diastolic heart failure (HCC)   History of renal transplant   Interstitial lung disease (HCC)   Protein-calorie malnutrition, severe   Community acquired pneumonia   Acute hypoxemic respiratory failure (HCC)   Pressure injury of skin   Plan   Urine output is low - recorded around net even. Weight stable. CVP 11 this am. MAP in 70's on art line on levophed. Weaning sedation. LVEF appears normal on echo. Would continue diltiazem gtts for rate control - wean sedation and pressor as tolerated.  Time Spent Directly with Patient:  I have spent a total of 25 minutes with the patient reviewing hospital notes, telemetry, EKGs, labs and examining the patient as well as establishing an assessment and plan that was discussed personally with the patient. > 50% of time was spent in direct patient care.  Length of Stay:  LOS: 2 days   Pixie Casino, MD, [facility], [place] Director of the [facility] Lipid Disorders &  [facility] Attending Cardiologist  Direct Dial: [phone]  Fax: [phone]  Website:  [email or web address] 01/05/2017, 7:50 AM

## 2017-01-05 NOTE — Procedures (Signed)
Central Venous Catheter Insertion Procedure Note SONDI DESCH [id number] [birth year]  Procedure: Insertion of Central Venous Catheter Indications: Assessment of intravascular volume, Drug and/or fluid administration and Frequent blood sampling  Procedure Details Consent: Unable to obtain consent because of emergent medical necessity. Time Out: Verified patient identification, verified procedure, site/side was marked, verified correct patient position, special equipment/implants available, medications/allergies/relevent history reviewed, required imaging and test results available.  Performed  Maximum sterile technique was used including antiseptics, cap, gloves, gown, hand hygiene, mask and sheet. Skin prep: Chlorhexidine; local anesthetic administered A antimicrobial bonded/coated triple lumen catheter was placed in the right internal jugular vein using the Seldinger technique.  Evaluation Blood flow good Complications: No apparent complications Patient did tolerate procedure well. Chest X-ray ordered to verify placement.  CXR: pending.  Hayden Pedro, AGACNP-BC [facility]  Pgr: [phone]  PCCM Pgr: [phone]

## 2017-01-05 NOTE — Procedures (Signed)
Arterial Catheter Insertion Procedure Note Morgan Cooper [id number] 11/23/1945  Procedure: Insertion of Arterial Catheter  Indications: Blood pressure monitoring and Frequent blood sampling  Procedure Details Consent: Unable to obtain consent because of emergent medical necessity. Time Out: Verified patient identification, verified procedure, site/side was marked, verified correct patient position, special equipment/implants available, medications/allergies/relevent history reviewed, required imaging and test results available.  Performed  Maximum sterile technique was used including antiseptics, cap, gloves, gown, hand hygiene, mask and sheet. Skin prep: Chlorhexidine; local anesthetic administered 22 gauge catheter was inserted into left radial artery using the Seldinger technique.  Evaluation Blood flow good; BP tracing good. Complications: No apparent complications.   Clance Boll 01/05/2017  0630

## 2017-01-05 NOTE — Progress Notes (Signed)
Pharmacy Antibiotic Note  Morgan Cooper is a 71 y.o. female admitted on 01/03/2017 with pneumonia.  Pharmacy has been consulted for vancomycin and zosyn dosing. Patient previously noted to be on azithromycin, CTX, and vancomycin from 11/14>11/15.   WBC is trending up.  SCr doubled in 12 hours and patient is putting out very little urine (per RN total of ~60 mL all day today).   Plan: Hold Vancomycin for now and recheck random vancomycin level in AM to determine if safe to redose.  Decrease Zosyn to 2.25g IV every 8 hours.  Monitor renal function, clinical picture, and culture data F/u length of therapy   Height: 5\' 5"  (165.1 cm) Weight: 110 lb 10.7 oz (50.2 kg) IBW/kg (Calculated) : 57  Temp (24hrs), Avg:98.3 F (36.8 C), Min:97.6 F (36.4 C), Max:98.7 F (37.1 C)  Recent Labs  Lab 01/03/17 0708 01/03/17 1004 01/03/17 1158 01/04/17 0432 01/04/17 0951 01/05/17 0245 01/05/17 1656  WBC 9.0  --   --  8.8 11.1* 14.2*  --   CREATININE 0.99  --   --  0.80 0.90 0.95 1.67*  LATICACIDVEN  --  1.45 1.70  --   --   --  1.9    Estimated Creatinine Clearance: 24.1 mL/min (A) (by C-G formula based on SCr of 1.67 mg/dL (H)).    Allergies  Allergen Reactions  . Amoxicillin Diarrhea    Has patient had a PCN reaction causing immediate rash, facial/tongue/throat swelling, SOB or lightheadedness with hypotension: No Has patient had a PCN reaction causing severe rash involving mucus membranes or skin necrosis: No Has patient had a PCN reaction that required hospitalization No Has patient had a PCN reaction occurring within the last 10 years: Yes If all of the above answers are "NO", then may proceed with Cephalosporin use.   . Tape Rash   Antimicrobials: 11/14 Vanc > 11/15, 11/16 >>  11/14 CTX > 11/15  11/14 Azithromycin > 11/15 11/16 Zosyn >>   Microbiology:  11/14 MRSA PCR: neg  11/14 blood cx: pending   Sloan Leiter, PharmD, BCPS, BCCCP Clinical Pharmacist Clinical phone  01/05/2017 until 11PM - #[phone] After hours, please call #[phone] 01/05/17 8:53 PM

## 2017-01-05 NOTE — Progress Notes (Signed)
Initial Nutrition Assessment  DOCUMENTATION CODES:   Not applicable  INTERVENTION:  - Initiate tube feeding of Vital AF 1.2 at a rate of 45 ml/hr (1080 ml daily)  Tube feeding regimen provides 1296 kcal, 81 grams of protein, and 875 ml of H2O.   NUTRITION DIAGNOSIS:   Inadequate oral intake related to inability to eat as evidenced by NPO status.  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   Patient is currently intubated on ventilator support MV: 8.1 L/min Temp (24hrs), Avg:98.5 F (36.9 C), Min:97.6 F (36.4 C), Max:99.2 F (37.3 C)  Propofol: off   Spoke with family members at bedside.  Per pt's daughter pt has finally stabilized in weight for the past month after recent weight loss. Pt's daughter reports an increase in appetite but that "she doesn't eat like she use to" Per daughter, pt consumes at least one Ensure Plus daily at home.  Labs reviewed; CBG 197-307, Magnesium 3.1 Medications reviewed; Solu-Cortef, sliding scale insulin, Lantus, Protonix  Pt previously identified with malnutrition. Per chart, weight shows a slight increase since last RD encounter.  RD unable to complete nutrition-focused physical exam at this time, will complete at follow-up  Diet Order:  Diet NPO time specified  EDUCATION NEEDS:   No education needs have been identified at this time  Skin:  Skin Integrity Issues:: Stage I Stage I: sacrum  Last BM:  No Recorded BM Date  Height:   Ht Readings from Last 1 Encounters:  01/03/17 5\' 5"  (1.651 m)    Weight:   Wt Readings from Last 1  Encounters:  01/05/17 110 lb 10.7 oz (50.2 kg)    Ideal Body Weight:  56.8 kg  BMI:  Body mass index is 18.42 kg/m.  Estimated Nutritional Needs:   Kcal:  1246  Protein:  65-80 grams  Fluid:  >/= 1.2 L/d  Parks Ranger, MS, RDN, LDN 01/05/2017 4:26 PM

## 2017-01-05 NOTE — Progress Notes (Signed)
[facility] Progress Note Patient Name: Morgan Cooper DOB: 11/03/1945 MRN: [id number]   Date of Service  01/05/2017  HPI/Events of Note  Hypoglycemia.  Blood sugar greater than 300  eICU Interventions  8 units subq insulin 1 dose Continue sliding scale coverage.  If blood sugar continues to be high she will need insulin drip.      Intervention Category Major Interventions: Hyperglycemia - active titration of insulin therapy  Tran Randle 01/05/2017, 4:04 AM

## 2017-01-05 NOTE — Progress Notes (Signed)
[facility] Progress Note Patient Name: Morgan Cooper DOB: 1945/03/28 MRN: [id number]   Date of Service  01/05/2017  HPI/Events of Note  Patient seems to be on atrial fibrillation. Renal function steadily worsening. Concern for worsening renal clearance.   eICU Interventions  1. Holding further anticoagulation pending review and discussion of risks versus benefits by rounding team 2. Pharmacist to assist in dose adjustment of antibiotics and other medications     Intervention Category Major Interventions: Other:  Tera Partridge 01/05/2017, 8:39 PM

## 2017-01-06 ENCOUNTER — Inpatient Hospital Stay (HOSPITAL_COMMUNITY): Payer: Medicare Other

## 2017-01-06 DIAGNOSIS — Z940 Kidney transplant status: Secondary | ICD-10-CM

## 2017-01-06 LAB — BLOOD GAS, ARTERIAL
Acid-base deficit: 4.2 mmol/L — ABNORMAL HIGH (ref 0.0–2.0)
BICARBONATE: 20.8 mmol/L (ref 20.0–28.0)
Drawn by: 51551
FIO2: 40
LHR: 18 {breaths}/min
MECHVT: 460 mL
O2 Saturation: 93.5 %
PEEP: 5 cmH2O
PO2 ART: 71.6 mmHg — AB (ref 83.0–108.0)
Patient temperature: 98.6
pCO2 arterial: 41.1 mmHg (ref 32.0–48.0)
pH, Arterial: 7.324 — ABNORMAL LOW (ref 7.350–7.450)

## 2017-01-06 LAB — APTT: aPTT: 58 seconds — ABNORMAL HIGH (ref 24–36)

## 2017-01-06 LAB — PHOSPHORUS
PHOSPHORUS: 4.1 mg/dL (ref 2.5–4.6)
Phosphorus: 3.9 mg/dL (ref 2.5–4.6)

## 2017-01-06 LAB — COMPREHENSIVE METABOLIC PANEL
ALBUMIN: 2.4 g/dL — AB (ref 3.5–5.0)
ALK PHOS: 62 U/L (ref 38–126)
ALT: 10 U/L — ABNORMAL LOW (ref 14–54)
AST: 21 U/L (ref 15–41)
Anion gap: 11 (ref 5–15)
BILIRUBIN TOTAL: 1 mg/dL (ref 0.3–1.2)
BUN: 41 mg/dL — AB (ref 6–20)
CALCIUM: 8.4 mg/dL — AB (ref 8.9–10.3)
CO2: 21 mmol/L — ABNORMAL LOW (ref 22–32)
Chloride: 100 mmol/L — ABNORMAL LOW (ref 101–111)
Creatinine, Ser: 2.2 mg/dL — ABNORMAL HIGH (ref 0.44–1.00)
GFR calc Af Amer: 25 mL/min — ABNORMAL LOW (ref >60)
GFR calc non Af Amer: 21 mL/min — ABNORMAL LOW (ref >60)
GLUCOSE: 365 mg/dL — AB (ref 65–99)
POTASSIUM: 3.8 mmol/L (ref 3.5–5.1)
Sodium: 132 mmol/L — ABNORMAL LOW (ref 135–145)
TOTAL PROTEIN: 6.2 g/dL — AB (ref 6.5–8.1)

## 2017-01-06 LAB — PROCALCITONIN: Procalcitonin: 1.59 ng/mL

## 2017-01-06 LAB — GLUCOSE, CAPILLARY
GLUCOSE-CAPILLARY: 352 mg/dL — AB (ref 65–99)
GLUCOSE-CAPILLARY: 307 mg/dL — AB (ref 65–99)
GLUCOSE-CAPILLARY: 264 mg/dL — AB (ref 65–99)
Glucose-Capillary: 355 mg/dL — ABNORMAL HIGH (ref 65–99)
Glucose-Capillary: 214 mg/dL — ABNORMAL HIGH (ref 65–99)
Glucose-Capillary: 237 mg/dL — ABNORMAL HIGH (ref 65–99)

## 2017-01-06 LAB — CBC
HEMATOCRIT: 29.7 % — AB (ref 36.0–46.0)
Hemoglobin: 9.7 g/dL — ABNORMAL LOW (ref 12.0–15.0)
MCH: 30.4 pg (ref 26.0–34.0)
MCHC: 32.7 g/dL (ref 30.0–36.0)
MCV: 93.1 fL (ref 78.0–100.0)
Platelets: 232 10*3/uL (ref 150–400)
RBC: 3.19 MIL/uL — ABNORMAL LOW (ref 3.87–5.11)
RDW: 14.6 % (ref 11.5–15.5)
WBC: 12 10*3/uL — ABNORMAL HIGH (ref 4.0–10.5)

## 2017-01-06 LAB — MAGNESIUM
MAGNESIUM: 2.8 mg/dL — AB (ref 1.7–2.4)
Magnesium: 2.7 mg/dL — ABNORMAL HIGH (ref 1.7–2.4)

## 2017-01-06 LAB — VANCOMYCIN, RANDOM: VANCOMYCIN RM: 15

## 2017-01-06 LAB — HEPARIN LEVEL (UNFRACTIONATED)

## 2017-01-06 MED ORDER — PANTOPRAZOLE SODIUM 40 MG PO PACK
40.0000 mg | PACK | ORAL | Status: DC
  Administered 2017-01-07 – 2017-01-12 (×6): 40 mg
  Filled 2017-01-06 (×7): qty 20

## 2017-01-06 MED ORDER — HEPARIN (PORCINE) IN NACL 100-0.45 UNIT/ML-% IJ SOLN
950.0000 [IU]/h | INTRAMUSCULAR | Status: DC
  Administered 2017-01-06: 1000 [IU]/h via INTRAVENOUS
  Administered 2017-01-07 – 2017-01-08 (×2): 1100 [IU]/h via INTRAVENOUS
  Filled 2017-01-06 (×6): qty 250

## 2017-01-06 MED ORDER — INSULIN GLARGINE 100 UNIT/ML ~~LOC~~ SOLN
20.0000 [IU] | Freq: Every day | SUBCUTANEOUS | Status: DC
  Administered 2017-01-06: 20 [IU] via SUBCUTANEOUS
  Filled 2017-01-06: qty 0.2

## 2017-01-06 MED ORDER — VANCOMYCIN HCL IN DEXTROSE 750-5 MG/150ML-% IV SOLN
750.0000 mg | Freq: Once | INTRAVENOUS | Status: AC
  Administered 2017-01-06: 750 mg via INTRAVENOUS
  Filled 2017-01-06: qty 150

## 2017-01-06 NOTE — Progress Notes (Signed)
RT called to room by RN due to patient sat drop issues. RT deep suctioned patient and increased oxygen from 60% to 70%. Patient sats are now 95%. No complications or signs of distress noted. RT will continue to monitor.

## 2017-01-06 NOTE — Progress Notes (Signed)
PULMONARY / CRITICAL CARE MEDICINE   Name: Morgan Cooper MRN: [id number] DOB: 02-24-1945    ADMISSION DATE:  01/03/2017 CONSULTATION DATE:  01/04/17  REFERRING MD:  TRH  CHIEF COMPLAINT:  SOB  HISTORY OF PRESENT ILLNESS:   71 yo female former smoker presented with dyspnea and hypoxia.  She had A fib with RVR and concern for HCAP with progression pulmonary infiltrates.  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  SUBJECTIVE:  Off pressors.  Tolerating some pressure support.  Remains on cardizem.  VITAL SIGNS: BP (!) 115/55   Pulse 92   Temp 97.7 F (36.5 C) (Oral)   Resp (!) 21   Ht 5\' 5"  (1.651 m)   Wt 114 lb 6.7 oz (51.9 kg)   SpO2 96%   BMI 19.04 kg/m   HEMODYNAMICS: CVP:  [11 mmHg-14 mmHg] 13 mmHg  VENTILATOR SETTINGS: Vent Mode: PRVC FiO2 (%):  [40 %-50 %] 40 % Set Rate:  [16 bmp-18 bmp] 16 bmp Vt Set:  [460 mL] 460 mL PEEP:  [15 cmH20] 15 cmH20 Plateau Pressure:  [22 cmH20-26 cmH20] 23 cmH20  INTAKE / OUTPUT: I/O last 3 completed shifts: In: 2836.8 [I.V.:1793.8; NG/GT:643; IV Piggyback:400] Out: 736 [Urine:736]  PHYSICAL EXAMINATION:  General - sedated Eyes - pupils reactive ENT - ETT in place Cardiac - irregular, no murmur Chest - no wheeze Abd - soft, non tender Ext - no edema Skin - no rashes Neuro - RASS -2   LABS:  BMET Recent Labs  Lab 01/05/17 0245 01/05/17 1656 01/06/17 0508  NA 136 134* 132*  K 3.6 4.1 3.8  CL 103 102 100*  CO2 24 24 21*  BUN 14 28* 41*  CREATININE 0.95 1.67* 2.20*  GLUCOSE 254* 295* 365*    Electrolytes Recent Labs  Lab 01/05/17 0245 01/05/17 1150 01/05/17 1656 01/06/17 0508  CALCIUM 8.4*  --  8.8* 8.4*  MG 1.9 3.1* 2.9* 2.7*  PHOS 3.5 2.6 2.9 3.9    CBC Recent Labs  Lab 01/04/17 0951 01/05/17 0245  01/06/17 0508  WBC 11.1* 14.2* 12.0*  HGB 11.5* 11.1* 9.7*  HCT 33.8* 33.7* 29.7*  PLT 166 257 232    Coag's Recent Labs  Lab 01/03/17 0708  INR 1.59    Sepsis Markers Recent Labs  Lab 01/03/17 1004 01/03/17 1158 01/05/17 0245 01/05/17 1656 01/06/17 0508  LATICACIDVEN 1.45 1.70  --  1.9  --   PROCALCITON  --   --  0.52  --  1.59    ABG Recent Labs  Lab 01/05/17 0435 01/05/17 1103 01/06/17 0435  PHART 7.339* 7.348* 7.324*  PCO2ART 44.1 45.7 41.1  PO2ART 244* 251.0* 71.6*    Liver Enzymes Recent Labs  Lab 01/03/17 0708 01/04/17 0951 01/06/17 0508  AST 27 29 21   ALT 18 15 10*  ALKPHOS 65 59 62  BILITOT 1.1 1.5* 1.0  ALBUMIN 3.3* 3.0* 2.4*    Cardiac Enzymes No results for input(s): TROPONINI, PROBNP in the last 168 hours.  Glucose Recent Labs  Lab 01/05/17 1152 01/05/17 1537 01/05/17 2018 01/05/17 2312 01/06/17 0335 01/06/17 0814  GLUCAP 264* 272* 137* 228* 352* 355*    Imaging Dg Chest Port 1 View  Result Date: 01/06/2017 CLINICAL DATA:  Healthcare associated pneumonia. EXAM: PORTABLE CHEST 1 VIEW COMPARISON:  One-view chest x-ray 01/05/2017 FINDINGS: Endotracheal tube is stable  in position, 3 cm above the carina. A right IJ line is stable. NG tube terminates in the stomach. The heart is enlarged. Aeration continues to improve. Left greater than right pleural effusions remain. IMPRESSION: 1. Continued improvement of right-sided airspace disease. 2. Stable emphysema with chronic interstitial coarsening. 3. Stable small bilateral pleural effusions. 4. The support apparatus is stable. Electronically Signed   By: San Morelle M.D.   On: 01/06/2017 07:37     STUDIES:  CTA PE 11/14 >> no PE, diffuse RUL airspace disease superimposed on emphysema and ILD Echo 11/15 >> EF 55 to 60%, PAS 39 mmHg  CULTURES: Legionella Ag 11/14 >> negative Pneumococcal Ag 11/14 >> negative Influenza PCR 11/14 >> negative Blood 11/14 >>  Respiratory viral  panel 11/15 >> negative Sputum 11/16 >> PCP 11/16 >>   ANTIBIOTICS: Zithromax 11/14 >> 11/25 Rocephin 11/14 >> 11/15 Vancomycin 11/14 >> Zosyn 11/15 >>   SIGNIFICANT EVENTS: 11/14 Admit 11/16 to ICU  LINES/TUBES: ETT 11/16 >> Rt IJ CVL 11/16 >> Lt radial aline 11/16 >>   DISCUSSION: 71 yo female with VDRF from HCAP, pulmonary edema in setting of A fib with RVR and acute on chronic diastolic CHF.  Has hx of COPD, ILD, and renal transplant.  ASSESSMENT / PLAN:  Acute on chronic hypoxic respiratory failure. - HCAP, pulmonary edema, COPD, and ILD - f/u CXR - day 4/7 Abx - scheduled BDs  A fib with RVR. Acute on chronic diastolic CHF. - change to heparin gtt in place of eliquis with worsening renal fx - continue diltiazem per cardiology - even fluid balance - continue lipitor  Hx of renal transplant. Acute renal failure with ATN. - f/u renal fx - if continues to get worse, then might need nephrology assessment - continue solu cortef - hold imuran in setting of infection  Severe protein calorie malnutrition. - tube feeds  Steroid induced hyperglycemia. - SSI - increase lantus to 20 units daily  Anemia of critical illness and chronic disease. - f/u CBC  Acute metabolic encephalopathy. - RASS goal 0 to -1  DVT prophylaxis - heparin gtt SUP - protonix Nutrition - tube feeds Goals of care - full code  Updated family at bedside  CC time 37 minutes  Chesley Mires, MD [place] 01/06/2017, 10:31 AM Pager:  [phone] After 3pm call: [phone]

## 2017-01-06 NOTE — Progress Notes (Signed)
Pharmacy Antibiotic Note  Morgan Cooper is a 71 y.o. female admitted on 01/03/2017 with pneumonia.  Pharmacy has been consulted for vancomycin and zosyn dosing. Patient previously noted to be on azithromycin, CTX, and vancomycin from 11/14>11/15.   Afebrile overnight, wbc trending down to 12 this morning. SCr continues to worsen this morning from 0.8>>1.6>>2.2 this morning with very little uop overnight.   Vancomycin random level checked this morning was within range at 15 with her last dose of vancomycin given at 0300 on 11/16.  Given continued worsening of renal function will give one time dose of vancomycin this morning and consider repeating random level in about 48 hours.  Plan: Give 1g IV vancomycin this morning Recheck random vancomycin level on Monday morning with am labs, check soon if renal function makes significant recovery Continue Zosyn at 2.25g IV every 8 hours.  Monitor renal function, clinical picture, and culture data F/u length of therapy   Height: 5\' 5"  (165.1 cm) Weight: 114 lb 6.7 oz (51.9 kg) IBW/kg (Calculated) : 57  Temp (24hrs), Avg:98.1 F (36.7 C), Min:97.6 F (36.4 C), Max:98.7 F (37.1 C)  Recent Labs  Lab 01/03/17 0708 01/03/17 1004 01/03/17 1158 01/04/17 0432 01/04/17 0951 01/05/17 0245 01/05/17 1656 01/06/17 0500 01/06/17 0508  WBC 9.0  --   --  8.8 11.1* 14.2*  --   --  12.0*  CREATININE 0.99  --   --  0.80 0.90 0.95 1.67*  --  2.20*  LATICACIDVEN  --  1.45 1.70  --   --   --  1.9  --   --   VANCORANDOM  --   --   --   --   --   --   --  15  --     Estimated Creatinine Clearance: 18.9 mL/min (A) (by C-G formula based on SCr of 2.2 mg/dL (H)).    Allergies  Allergen Reactions  . Amoxicillin Diarrhea    Has patient had a PCN reaction causing immediate rash, facial/tongue/throat swelling, SOB or lightheadedness with hypotension: No Has patient had a PCN reaction causing severe rash involving mucus membranes or skin necrosis: No Has  patient had a PCN reaction that required hospitalization No Has patient had a PCN reaction occurring within the last 10 years: Yes If all of the above answers are "NO", then may proceed with Cephalosporin use.   . Tape Rash   Antimicrobials: 11/14 Vanc > 11/15, 11/16 >>  11/14 CTX > 11/15  11/14 Azithromycin > 11/15 11/16 Zosyn >>   Microbiology:  11/14 MRSA PCR: neg  11/14 blood cx: ngtd 11/16 TA: pending 11/15 Respiratory panel - ng  Erin Hearing PharmD., BCPS Clinical Pharmacist Pager [phone] 01/06/2017 7:29 AM

## 2017-01-06 NOTE — Progress Notes (Signed)
DAILY PROGRESS NOTE   Patient Name: Morgan Cooper Date of Encounter: 01/06/2017  Chief Complaint   Intubated, sedated  Patient Profile   70 yo patient of Dr. Irish Lack with new onset a-fib with RVR, possible underlying PNA and likely CHF.  Subjective   Off pressors today - on diltiazem with rate-controlled a-fib. Creatinine continues to worsen- 2.2 today, GFR 20. May need nephrology evaluation. CVP 13, weight up. She is 2L positive.  Objective   Vitals:   01/06/17 1230 01/06/17 1245 01/06/17 1300 01/06/17 1330  BP:   94/62   Pulse: (!) 103 99 99 92  Resp: 17 14 13 20   Temp:      TempSrc:      SpO2: (!) 85% (!) 88% (!) 87% (!) 88%  Weight:      Height:        Intake/Output Summary (Last 24 hours) at 01/06/2017 1400 Last data filed at 01/06/2017 1300 Gross per 24 hour  Intake 2294.48 ml  Output 296 ml  Net 1998.48 ml   Filed Weights   01/03/17 2017 01/05/17 0400 01/06/17 0500  Weight: 111 lb 8.8 oz (50.6 kg) 110 lb 10.7 oz (50.2 kg) 114 lb 6.7 oz (51.9 kg)    Physical Exam   General appearance: sedated on vent, intubated Neck: no carotid bruit, no JVD and thyroid not enlarged, symmetric, no tenderness/mass/nodules Lungs: diminished breath sounds bilaterally, rales bibasilar and rhonchi bilaterally Heart: irregularly irregular rhythm and rate controlled Abdomen: soft, non-tender; bowel sounds normal; no masses,  no organomegaly Extremities: extremities normal, atraumatic, no cyanosis or edema Pulses: 2+ and symmetric Skin: Skin color, texture, turgor normal. No rashes or lesions Neurologic: Mental status: Intubated, sedated on vent Psych: Cannot assess  Inpatient Medications    Scheduled Meds: . atorvastatin  20 mg Per Tube Daily  . chlorhexidine gluconate (MEDLINE KIT)  15 mL Mouth Rinse BID  . cycloSPORINE  1 drop Both Eyes BID  . hydrocortisone sod succinate (SOLU-CORTEF) inj  50 mg Intravenous Q6H  . insulin aspart  0-20 Units Subcutaneous Q4H  .  insulin glargine  20 Units Subcutaneous QHS  . ipratropium  0.5 mg Nebulization Q6H  . levalbuterol  0.63 mg Nebulization Q6H  . mouth rinse  15 mL Mouth Rinse QID  . pantoprazole sodium  40 mg Per Tube Q24H    Continuous Infusions: . sodium chloride 10 mL/hr at 01/06/17 0800  . diltiazem (CARDIZEM) infusion 10 mg/hr (01/06/17 0800)  . feeding supplement (VITAL AF 1.2 CAL) 1,000 mL (01/06/17 0800)  . fentaNYL infusion INTRAVENOUS 100 mcg/hr (01/06/17 1039)  . heparin 1,000 Units/hr (01/06/17 1126)  . piperacillin-tazobactam (ZOSYN)  IV Stopped (01/06/17 1038)    PRN Meds: Place/Maintain arterial line **AND** sodium chloride, docusate, fentaNYL, levalbuterol, midazolam, ondansetron (ZOFRAN) IV   Labs   Results for orders placed or performed during the hospital encounter of 01/03/17 (from the past 48 hour(s))  Blood gas, arterial     Status: Abnormal   Collection Time: 01/04/17 10:40 PM  Result Value Ref Range   FIO2 100.00     Comment: CORRECTED ON 11/16 AT 0856: PREVIOUSLY REPORTED AS 1.00   Delivery systems BILEVEL POSITIVE AIRWAY PRESSURE    LHR 8 resp/min   Inspiratory PAP 12.0    Expiratory PAP 6.0    pH, Arterial 7.436 7.350 - 7.450   pCO2 arterial 38.2 32.0 - 48.0 mmHg   pO2, Arterial 50.5 (L) 83.0 - 108.0 mmHg   Bicarbonate 25.3 20.0 - 28.0  mmol/L   Acid-Base Excess 1.5 0.0 - 2.0 mmol/L   O2 Saturation 84.0 %   Patient temperature 98.6    Collection site LEFT RADIAL    Drawn by [id number]    Sample type ARTERIAL DRAW    Allens test (pass/fail) PASS PASS  Respiratory Panel by PCR     Status: None   Collection Time: 01/04/17 11:45 PM  Result Value Ref Range   Adenovirus NOT DETECTED NOT DETECTED   Coronavirus 229E NOT DETECTED NOT DETECTED   Coronavirus HKU1 NOT DETECTED NOT DETECTED   Coronavirus NL63 NOT DETECTED NOT DETECTED   Coronavirus OC43 NOT DETECTED NOT DETECTED   Metapneumovirus NOT DETECTED NOT DETECTED   Rhinovirus / Enterovirus NOT DETECTED NOT  DETECTED   Influenza A NOT DETECTED NOT DETECTED   Influenza B NOT DETECTED NOT DETECTED   Parainfluenza Virus 1 NOT DETECTED NOT DETECTED   Parainfluenza Virus 2 NOT DETECTED NOT DETECTED   Parainfluenza Virus 3 NOT DETECTED NOT DETECTED   Parainfluenza Virus 4 NOT DETECTED NOT DETECTED   Respiratory Syncytial Virus NOT DETECTED NOT DETECTED   Bordetella pertussis NOT DETECTED NOT DETECTED   Chlamydophila pneumoniae NOT DETECTED NOT DETECTED   Mycoplasma pneumoniae NOT DETECTED NOT DETECTED  Glucose, capillary     Status: Abnormal   Collection Time: 01/05/17 12:02 AM  Result Value Ref Range   Glucose-Capillary 197 (H) 65 - 99 mg/dL   Comment 1 Capillary Specimen   Culture, respiratory (NON-Expectorated)     Status: None (Preliminary result)   Collection Time: 01/05/17 12:49 AM  Result Value Ref Range   Specimen Description TRACHEAL ASPIRATE    Special Requests NONE    Gram Stain      FEW WBC PRESENT,BOTH PMN AND MONONUCLEAR NO ORGANISMS SEEN    Culture CULTURE REINCUBATED FOR BETTER GROWTH    Report Status PENDING   Blood gas, arterial     Status: Abnormal   Collection Time: 01/05/17  1:35 AM  Result Value Ref Range   FIO2 1.00    Delivery systems VENTILATOR    Mode BIVENT    Peep/cpap HIGH PEEP 35 LOW PEEP 15 cm H20   PIP HIGH TIME 5 LOW TIME 1 cm H2O   pH, Arterial 7.302 (L) 7.350 - 7.450   pCO2 arterial 47.4 32.0 - 48.0 mmHg   pO2, Arterial 350 (H) 83.0 - 108.0 mmHg   Bicarbonate 22.7 20.0 - 28.0 mmol/L   Acid-base deficit 2.7 (H) 0.0 - 2.0 mmol/L   O2 Saturation 99.3 %   Patient temperature 98.6    Collection site LEFT RADIAL    Drawn by [id number]    Sample type ARTERIAL DRAW    Allens test (pass/fail) PASS PASS  CBC with Differential/Platelet     Status: Abnormal   Collection Time: 01/05/17  2:45 AM  Result Value Ref Range   WBC 14.2 (H) 4.0 - 10.5 K/uL   RBC 3.56 (L) 3.87 - 5.11 MIL/uL   Hemoglobin 11.1 (L) 12.0 - 15.0 g/dL   HCT 33.7 (L) 36.0 - 46.0 %   MCV  94.7 78.0 - 100.0 fL   MCH 31.2 26.0 - 34.0 pg   MCHC 32.9 30.0 - 36.0 g/dL   RDW 14.5 11.5 - 15.5 %   Platelets 257 150 - 400 K/uL   Neutrophils Relative % 88 %   Neutro Abs 12.5 (H) 1.7 - 7.7 K/uL   Lymphocytes Relative 6 %   Lymphs Abs 0.8 0.7 - 4.0 K/uL  Monocytes Relative 6 %   Monocytes Absolute 0.8 0.1 - 1.0 K/uL   Eosinophils Relative 0 %   Eosinophils Absolute 0.0 0.0 - 0.7 K/uL   Basophils Relative 0 %   Basophils Absolute 0.0 0.0 - 0.1 K/uL  Basic metabolic panel     Status: Abnormal   Collection Time: 01/05/17  2:45 AM  Result Value Ref Range   Sodium 136 135 - 145 mmol/L   Potassium 3.6 3.5 - 5.1 mmol/L   Chloride 103 101 - 111 mmol/L   CO2 24 22 - 32 mmol/L   Glucose, Bld 254 (H) 65 - 99 mg/dL   BUN 14 6 - 20 mg/dL   Creatinine, Ser 0.95 0.44 - 1.00 mg/dL   Calcium 8.4 (L) 8.9 - 10.3 mg/dL   GFR calc non Af Amer 58 (L) >60 mL/min   GFR calc Af Amer >60 >60 mL/min    Comment: (NOTE) The eGFR has been calculated using the CKD EPI equation. This calculation has not been validated in all clinical situations. eGFR's persistently <60 mL/min signify possible Chronic Kidney Disease.    Anion gap 9 5 - 15  Magnesium     Status: None   Collection Time: 01/05/17  2:45 AM  Result Value Ref Range   Magnesium 1.9 1.7 - 2.4 mg/dL  Phosphorus     Status: None   Collection Time: 01/05/17  2:45 AM  Result Value Ref Range   Phosphorus 3.5 2.5 - 4.6 mg/dL  Procalcitonin - Baseline     Status: None   Collection Time: 01/05/17  2:45 AM  Result Value Ref Range   Procalcitonin 0.52 ng/mL    Comment:        Interpretation: PCT > 0.5 ng/mL and <= 2 ng/mL: Systemic infection (sepsis) is possible, but other conditions are known to elevate PCT as well. (NOTE)         ICU PCT Algorithm               Non ICU PCT Algorithm    ----------------------------     ------------------------------         PCT < 0.25 ng/mL                 PCT < 0.1 ng/mL     Stopping of antibiotics             Stopping of antibiotics       strongly encouraged.               strongly encouraged.    ----------------------------     ------------------------------       PCT level decrease by               PCT < 0.25 ng/mL       >= 80% from peak PCT       OR PCT 0.25 - 0.5 ng/mL          Stopping of antibiotics                                             encouraged.     Stopping of antibiotics           encouraged.    ----------------------------     ------------------------------       PCT level decrease by  PCT >= 0.25 ng/mL       < 80% from peak PCT        AND PCT >= 0.5 ng/mL             Continuing antibiotics                                              encouraged.       Continuing antibiotics            encouraged.    ----------------------------     ------------------------------     PCT level increase compared          PCT > 0.5 ng/mL         with peak PCT AND          PCT >= 0.5 ng/mL             Escalation of antibiotics                                          strongly encouraged.      Escalation of antibiotics        strongly encouraged.   Lactate dehydrogenase     Status: Abnormal   Collection Time: 01/05/17  2:45 AM  Result Value Ref Range   LDH 305 (H) 98 - 192 U/L  TSH     Status: None   Collection Time: 01/05/17  2:45 AM  Result Value Ref Range   TSH 1.625 0.350 - 4.500 uIU/mL    Comment: Performed by a 3rd Generation assay with a functional sensitivity of <=0.01 uIU/mL.  Cortisol     Status: None   Collection Time: 01/05/17  2:45 AM  Result Value Ref Range   Cortisol, Plasma 46.4 ug/dL    Comment: (NOTE) AM    6.7 - 22.6 ug/dL PM   <10.0       ug/dL   Glucose, capillary     Status: Abnormal   Collection Time: 01/05/17  3:53 AM  Result Value Ref Range   Glucose-Capillary 307 (H) 65 - 99 mg/dL   Comment 1 Capillary Specimen    Comment 2 Notify RN   Blood gas, arterial     Status: Abnormal   Collection Time: 01/05/17  4:35 AM  Result Value Ref  Range   FIO2 70.00    Delivery systems VENTILATOR    Mode BIVENT    Peep/cpap HIGH PEEP 35 LOW PEEP15 cm H20    Comment: HIGH TIME 5.0 LOW TIME 1.0   pH, Arterial 7.339 (L) 7.350 - 7.450   pCO2 arterial 44.1 32.0 - 48.0 mmHg   pO2, Arterial 244 (H) 83.0 - 108.0 mmHg   Bicarbonate 23.1 20.0 - 28.0 mmol/L   Acid-base deficit 1.8 0.0 - 2.0 mmol/L   O2 Saturation 99.3 %   Patient temperature 98.6    Collection site A-LINE    Drawn by [phone]    Sample type ARTERIAL DRAW    Allens test (pass/fail) PASS PASS  Glucose, capillary     Status: Abnormal   Collection Time: 01/05/17  7:23 AM  Result Value Ref Range   Glucose-Capillary 243 (H) 65 - 99 mg/dL  I-STAT 3, arterial blood gas (G3+)     Status: Abnormal  Collection Time: 01/05/17 11:03 AM  Result Value Ref Range   pH, Arterial 7.348 (L) 7.350 - 7.450   pCO2 arterial 45.7 32.0 - 48.0 mmHg   pO2, Arterial 251.0 (H) 83.0 - 108.0 mmHg   Bicarbonate 25.3 20.0 - 28.0 mmol/L   TCO2 27 22 - 32 mmol/L   O2 Saturation 100.0 %   Acid-base deficit 1.0 0.0 - 2.0 mmol/L   Patient temperature 97.6 F    Collection site ARTERIAL LINE    Drawn by RT    Sample type ARTERIAL   Magnesium     Status: Abnormal   Collection Time: 01/05/17 11:50 AM  Result Value Ref Range   Magnesium 3.1 (H) 1.7 - 2.4 mg/dL  Phosphorus     Status: None   Collection Time: 01/05/17 11:50 AM  Result Value Ref Range   Phosphorus 2.6 2.5 - 4.6 mg/dL  Glucose, capillary     Status: Abnormal   Collection Time: 01/05/17 11:52 AM  Result Value Ref Range   Glucose-Capillary 264 (H) 65 - 99 mg/dL   Comment 1 Notify RN   Glucose, capillary     Status: Abnormal   Collection Time: 01/05/17  3:37 PM  Result Value Ref Range   Glucose-Capillary 272 (H) 65 - 99 mg/dL   Comment 1 Capillary Specimen    Comment 2 Notify RN   Magnesium     Status: Abnormal   Collection Time: 01/05/17  4:56 PM  Result Value Ref Range   Magnesium 2.9 (H) 1.7 - 2.4 mg/dL  Phosphorus     Status:  None   Collection Time: 01/05/17  4:56 PM  Result Value Ref Range   Phosphorus 2.9 2.5 - 4.6 mg/dL  Basic metabolic panel     Status: Abnormal   Collection Time: 01/05/17  4:56 PM  Result Value Ref Range   Sodium 134 (L) 135 - 145 mmol/L   Potassium 4.1 3.5 - 5.1 mmol/L   Chloride 102 101 - 111 mmol/L   CO2 24 22 - 32 mmol/L   Glucose, Bld 295 (H) 65 - 99 mg/dL   BUN 28 (H) 6 - 20 mg/dL   Creatinine, Ser 1.67 (H) 0.44 - 1.00 mg/dL   Calcium 8.8 (L) 8.9 - 10.3 mg/dL   GFR calc non Af Amer 30 (L) >60 mL/min   GFR calc Af Amer 34 (L) >60 mL/min    Comment: (NOTE) The eGFR has been calculated using the CKD EPI equation. This calculation has not been validated in all clinical situations. eGFR's persistently <60 mL/min signify possible Chronic Kidney Disease.    Anion gap 8 5 - 15  Lactic acid, plasma     Status: None   Collection Time: 01/05/17  4:56 PM  Result Value Ref Range   Lactic Acid, Venous 1.9 0.5 - 1.9 mmol/L  Glucose, capillary     Status: Abnormal   Collection Time: 01/05/17  8:18 PM  Result Value Ref Range   Glucose-Capillary 137 (H) 65 - 99 mg/dL   Comment 1 Capillary Specimen   Glucose, capillary     Status: Abnormal   Collection Time: 01/05/17 11:12 PM  Result Value Ref Range   Glucose-Capillary 228 (H) 65 - 99 mg/dL  Glucose, capillary     Status: Abnormal   Collection Time: 01/06/17  3:35 AM  Result Value Ref Range   Glucose-Capillary 352 (H) 65 - 99 mg/dL  Blood gas, arterial     Status: Abnormal   Collection Time: 01/06/17  4:35 AM  Result Value Ref Range   FIO2 40.00    Delivery systems VENTILATOR    Mode PRESSURE REGULATED VOLUME CONTROL    VT 460 mL   LHR 18 resp/min   Peep/cpap 5.0 cm H20   pH, Arterial 7.324 (L) 7.350 - 7.450   pCO2 arterial 41.1 32.0 - 48.0 mmHg   pO2, Arterial 71.6 (L) 83.0 - 108.0 mmHg   Bicarbonate 20.8 20.0 - 28.0 mmol/L   Acid-base deficit 4.2 (H) 0.0 - 2.0 mmol/L   O2 Saturation 93.5 %   Patient temperature 98.6     Collection site ARTERIAL LINE    Drawn by [phone]    Sample type ARTERIAL    Allens test (pass/fail) PASS PASS  Vancomycin, random     Status: None   Collection Time: 01/06/17  5:00 AM  Result Value Ref Range   Vancomycin Rm 15     Comment:        Random Vancomycin therapeutic range is dependent on dosage and time of specimen collection. A peak range is 20.0-40.0 ug/mL A trough range is 5.0-15.0 ug/mL          Procalcitonin     Status: None   Collection Time: 01/06/17  5:08 AM  Result Value Ref Range   Procalcitonin 1.59 ng/mL    Comment:        Interpretation: PCT > 0.5 ng/mL and <= 2 ng/mL: Systemic infection (sepsis) is possible, but other conditions are known to elevate PCT as well. (NOTE)         ICU PCT Algorithm               Non ICU PCT Algorithm    ----------------------------     ------------------------------         PCT < 0.25 ng/mL                 PCT < 0.1 ng/mL     Stopping of antibiotics            Stopping of antibiotics       strongly encouraged.               strongly encouraged.    ----------------------------     ------------------------------       PCT level decrease by               PCT < 0.25 ng/mL       >= 80% from peak PCT       OR PCT 0.25 - 0.5 ng/mL          Stopping of antibiotics                                             encouraged.     Stopping of antibiotics           encouraged.    ----------------------------     ------------------------------       PCT level decrease by              PCT >= 0.25 ng/mL       < 80% from peak PCT        AND PCT >= 0.5 ng/mL             Continuing antibiotics  encouraged.       Continuing antibiotics            encouraged.    ----------------------------     ------------------------------     PCT level increase compared          PCT > 0.5 ng/mL         with peak PCT AND          PCT >= 0.5 ng/mL             Escalation of antibiotics                                           strongly encouraged.      Escalation of antibiotics        strongly encouraged.   Magnesium     Status: Abnormal   Collection Time: 01/06/17  5:08 AM  Result Value Ref Range   Magnesium 2.7 (H) 1.7 - 2.4 mg/dL  Phosphorus     Status: None   Collection Time: 01/06/17  5:08 AM  Result Value Ref Range   Phosphorus 3.9 2.5 - 4.6 mg/dL  Comprehensive metabolic panel     Status: Abnormal   Collection Time: 01/06/17  5:08 AM  Result Value Ref Range   Sodium 132 (L) 135 - 145 mmol/L   Potassium 3.8 3.5 - 5.1 mmol/L   Chloride 100 (L) 101 - 111 mmol/L   CO2 21 (L) 22 - 32 mmol/L   Glucose, Bld 365 (H) 65 - 99 mg/dL   BUN 41 (H) 6 - 20 mg/dL   Creatinine, Ser 2.20 (H) 0.44 - 1.00 mg/dL   Calcium 8.4 (L) 8.9 - 10.3 mg/dL   Total Protein 6.2 (L) 6.5 - 8.1 g/dL   Albumin 2.4 (L) 3.5 - 5.0 g/dL   AST 21 15 - 41 U/L   ALT 10 (L) 14 - 54 U/L   Alkaline Phosphatase 62 38 - 126 U/L   Total Bilirubin 1.0 0.3 - 1.2 mg/dL   GFR calc non Af Amer 21 (L) >60 mL/min   GFR calc Af Amer 25 (L) >60 mL/min    Comment: (NOTE) The eGFR has been calculated using the CKD EPI equation. This calculation has not been validated in all clinical situations. eGFR's persistently <60 mL/min signify possible Chronic Kidney Disease.    Anion gap 11 5 - 15  CBC     Status: Abnormal   Collection Time: 01/06/17  5:08 AM  Result Value Ref Range   WBC 12.0 (H) 4.0 - 10.5 K/uL   RBC 3.19 (L) 3.87 - 5.11 MIL/uL   Hemoglobin 9.7 (L) 12.0 - 15.0 g/dL   HCT 29.7 (L) 36.0 - 46.0 %   MCV 93.1 78.0 - 100.0 fL   MCH 30.4 26.0 - 34.0 pg   MCHC 32.7 30.0 - 36.0 g/dL   RDW 14.6 11.5 - 15.5 %   Platelets 232 150 - 400 K/uL  Glucose, capillary     Status: Abnormal   Collection Time: 01/06/17  8:14 AM  Result Value Ref Range   Glucose-Capillary 355 (H) 65 - 99 mg/dL  Glucose, capillary     Status: Abnormal   Collection Time: 01/06/17 12:05 PM  Result Value Ref Range   Glucose-Capillary 307 (H) 65 - 99 mg/dL     Comment 1 Capillary Specimen    Comment 2 Notify RN  ECG   N/A  Telemetry   A-fib with CVR - Personally Reviewed  Radiology    Dg Chest Port 1 View  Result Date: 01/06/2017 CLINICAL DATA:  Healthcare associated pneumonia. EXAM: PORTABLE CHEST 1 VIEW COMPARISON:  One-view chest x-ray 01/05/2017 FINDINGS: Endotracheal tube is stable in position, 3 cm above the carina. A right IJ line is stable. NG tube terminates in the stomach. The heart is enlarged. Aeration continues to improve. Left greater than right pleural effusions remain. IMPRESSION: 1. Continued improvement of right-sided airspace disease. 2. Stable emphysema with chronic interstitial coarsening. 3. Stable small bilateral pleural effusions. 4. The support apparatus is stable. Electronically Signed   By: San Morelle M.D.   On: 01/06/2017 07:37   Dg Chest Port 1 View  Result Date: 01/05/2017 CLINICAL DATA:  Central line placement. EXAM: PORTABLE CHEST 1 VIEW COMPARISON:  Chest radiograph performed 01/04/2017 FINDINGS: A right IJ line is noted ending about the distal SVC. The patient's endotracheal tube is seen ending nearly 2 cm above the carina. An enteric tube is noted extending below the diaphragm. Diffuse bilateral airspace opacification may reflect pulmonary edema or pneumonia. No definite pleural effusion or pneumothorax is seen. The cardiomediastinal silhouette is borderline enlarged. No acute osseous abnormalities are identified. IMPRESSION: 1. Right IJ line noted ending about the distal SVC. 2. Endotracheal tube noted ending nearly 2 cm above the carina. 3. Diffuse bilateral airspace opacification may reflect pulmonary edema or pneumonia, redistributed since the prior study. 4. Borderline cardiomegaly. Electronically Signed   By: Garald Balding M.D.   On: 01/05/2017 01:11   Dg Chest Port 1 View  Result Date: 01/05/2017 CLINICAL DATA:  Endotracheal tube and orogastric tube placement. EXAM: PORTABLE CHEST 1 VIEW  COMPARISON:  Chest radiograph and CTA of the chest performed 01/03/2017 FINDINGS: The patient's endotracheal tube is seen ending 2 cm above the carina. An enteric tube is noted ending overlying the body of the stomach. Diffuse bilateral airspace opacification is worsened on both sides since the recent prior studies. A small left pleural effusion is noted. No pneumothorax is seen. The cardiomediastinal silhouette is borderline normal in size. No acute osseous abnormalities are seen. IMPRESSION: 1. Endotracheal tube seen ending 2 cm above the carina. 2. Enteric tube noted ending overlying the body of the stomach. 3. Worsening diffuse bilateral airspace opacification, concerning for multifocal pneumonia. Small left pleural effusion. Electronically Signed   By: Garald Balding M.D.   On: 01/05/2017 00:33   Dg Abd Portable 1v  Result Date: 01/05/2017 CLINICAL DATA:  71 year old female status post intubation and placement of enteric tube. EXAM: PORTABLE ABDOMEN - 1 VIEW COMPARISON:  CT of the abdomen pelvis dated 05/23/2016 FINDINGS: An endotracheal tube is partially visualized with tip and side-port in the left hemiabdomen, likely in the stomach. There is no bowel dilatation. Air is noted within the colon in the lower abdomen. Excreted contrast from recent study noted within the urinary bladder. Multiple surgical clips noted in the left upper abdomen and in the left hemipelvis. There is osteopenia with scoliosis and degenerative changes of the spine. Advanced atherosclerotic calcification of the abdominal aorta. There is apparent 3.2 cm aneurysmal dilatation of the abdominal aorta. Evaluation of the aorta is limited on this radiograph. CT may provide better evaluation. No definite free air. There are bibasilar densities, possibly scarring. An air containing structure in the left upper abdomen may represent a hiatal hernia. IMPRESSION: 1. Enteric tube the tip in the left hemiabdomen. No bowel dilatation. 2.  Advanced  atherosclerotic aorta and a 3.2 cm abdominal aortic aneurysm. CT may provide better evaluation. Electronically Signed   By: Anner Crete M.D.   On: 01/05/2017 00:34    Cardiac Studies   LV EF: 55% -   60%  ------------------------------------------------------------------- Indications:      Atrial fibrillation - 427.31.  ------------------------------------------------------------------- History:   PMH:  History of renal transplant.  Congestive heart failure.  Stroke.  Risk factors:  Current tobacco use. Dyslipidemia.  ------------------------------------------------------------------- Study Conclusions  - Left ventricle: The cavity size was normal. Systolic function was   normal. The estimated ejection fraction was in the range of 55%   to 60%. - Aortic valve: There was trivial regurgitation. - Left atrium: The atrium was moderately dilated. - Right ventricle: The cavity size was mildly dilated. Wall   thickness was normal. - Right atrium: The atrium was moderately dilated. - Pulmonary arteries: PA peak pressure: 39 mm Hg (S).  Assessment   Principal Problem:   Atrial fibrillation with RVR (HCC) Active Problems:   Chronic diastolic heart failure (HCC)   History of renal transplant   Interstitial lung disease (HCC)   Protein-calorie malnutrition, severe   Community acquired pneumonia   Acute hypoxemic respiratory failure (HCC)   Pressure injury of skin   Plan   Poor urine output- creatinine is rising. +2L overnight- weight is going up. May need CRRT. Continue diltiazem for rate-control. Updated sister at the bedside.  Time Spent Directly with Patient:  I have spent a total of 25 minutes with the patient reviewing hospital notes, telemetry, EKGs, labs and examining the patient as well as establishing an assessment and plan that was discussed personally with the patient. > 50% of time was spent in direct patient care.  Length of Stay:  LOS: 3 days   Pixie Casino, MD, [facility], [place] Director of the [facility]  [facility] Attending Cardiologist  Direct Dial: [phone]  Fax: [phone]  Website:  [email or web address] [email or web address] Jametta [place] 01/06/2017, 2:00 PM

## 2017-01-06 NOTE — Progress Notes (Signed)
[place] for heparin Indication: atrial fibrillation  Allergies  Allergen Reactions  . Amoxicillin Diarrhea    Has patient had a PCN reaction causing immediate rash, facial/tongue/throat swelling, SOB or lightheadedness with hypotension: No Has patient had a PCN reaction causing severe rash involving mucus membranes or skin necrosis: No Has patient had a PCN reaction that required hospitalization No Has patient had a PCN reaction occurring within the last 10 years: Yes If all of the above answers are "NO", then may proceed with Cephalosporin use.   . Tape Rash    Patient Measurements: Height: 5\' 5"  (165.1 cm) Weight: 114 lb 6.7 oz (51.9 kg) IBW/kg (Calculated) : 57 Heparin Dosing Weight: 51.9kg  Vital Signs: Temp: 98.7 F (37.1 C) (11/17 1938) Temp Source: Oral (11/17 1938) BP: 96/67 (11/17 2100) Pulse Rate: 95 (11/17 2100)  Labs: Recent Labs    01/04/17 0951 01/05/17 0245 01/05/17 1656 01/06/17 0508 01/06/17 2000  HGB 11.5* 11.1*  --  9.7*  --   HCT 33.8* 33.7*  --  29.7*  --   PLT 166 257  --  232  --   APTT  --   --   --   --  58*  HEPARINUNFRC  --   --   --   --  >2.20*  CREATININE 0.90 0.95 1.67* 2.20*  --     Estimated Creatinine Clearance: 18.9 mL/min (A) (by C-G formula based on SCr of 2.2 mg/dL (H)).  Medications:  Infusions:  . sodium chloride 10 mL/hr at 01/06/17 0800  . diltiazem (CARDIZEM) infusion 10 mg/hr (01/06/17 1601)  . feeding supplement (VITAL AF 1.2 CAL) 1,000 mL (01/06/17 1637)  . fentaNYL infusion INTRAVENOUS 25 mcg/hr (01/06/17 1805)  . heparin 1,000 Units/hr (01/06/17 1126)  . piperacillin-tazobactam (ZOSYN)  IV Stopped (01/06/17 1818)    Assessment: 34 yof presented with dyspnea and near syncope. She was on apixaban but now transitioning to IV heparin. Hgb is low at 9.7 and platelets are WNL. Pt with worsening renal function.   Heparin level elevated >2.2 as expected due to apixaban. APTT  slightly low at 58. No bleed or IV line issues noted per RN.  Goal of Therapy:  Heparin level 0.3-0.7 units/ml aPTT 66-102 seconds Monitor platelets by anticoagulation protocol: Yes   Plan:  Increase heparin gtt to 1100 units/hr Check an 8 hr aPTT Daily heparin level, CBC and aPTT Monitor for s/sx bleeding Apixaban on hold   Elicia Lamp, PharmD, BCPS Clinical Pharmacist 01/06/2017 10:00 PM

## 2017-01-06 NOTE — Progress Notes (Signed)
ANTICOAGULATION CONSULT NOTE - Initial Consult  Pharmacy Consult for heparin Indication: atrial fibrillation  Allergies  Allergen Reactions  . Amoxicillin Diarrhea    Has patient had a PCN reaction causing immediate rash, facial/tongue/throat swelling, SOB or lightheadedness with hypotension: No Has patient had a PCN reaction causing severe rash involving mucus membranes or skin necrosis: No Has patient had a PCN reaction that required hospitalization No Has patient had a PCN reaction occurring within the last 10 years: Yes If all of the above answers are "NO", then may proceed with Cephalosporin use.   . Tape Rash    Patient Measurements: Height: 5\' 5"  (165.1 cm) Weight: 114 lb 6.7 oz (51.9 kg) IBW/kg (Calculated) : 57 Heparin Dosing Weight: 51.9kg  Vital Signs: Temp: 97.7 F (36.5 C) (11/17 0823) Temp Source: Oral (11/17 0823) BP: 93/59 (11/17 1000) Pulse Rate: 99 (11/17 1000)  Labs: Recent Labs    01/04/17 0951 01/05/17 0245 01/05/17 1656 01/06/17 0508  HGB 11.5* 11.1*  --  9.7*  HCT 33.8* 33.7*  --  29.7*  PLT 166 257  --  232  CREATININE 0.90 0.95 1.67* 2.20*    Estimated Creatinine Clearance: 18.9 mL/min (A) (by C-G formula based on SCr of 2.2 mg/dL (H)).  Medications:  Infusions:  . sodium chloride 10 mL/hr at 01/06/17 0800  . diltiazem (CARDIZEM) infusion 10 mg/hr (01/06/17 0800)  . feeding supplement (VITAL AF 1.2 CAL) 1,000 mL (01/06/17 0800)  . fentaNYL infusion INTRAVENOUS 100 mcg/hr (01/06/17 1039)  . heparin 1,000 Units/hr (01/06/17 1126)  . piperacillin-tazobactam (ZOSYN)  IV Stopped (01/06/17 1038)    Assessment: 41 yof presented with dyspnea and near syncope. She was on apixaban but now transitioning to IV heparin. Hgb is low at 9.7 and platelets are WNL. No bleeding noted. Pt with worsening renal function.   Goal of Therapy:  Heparin level 0.3-0.7 units/ml aPTT 66-102 seconds Monitor platelets by anticoagulation protocol: Yes   Plan:   Heparin gtt 1000 units/hr - previously therapeutic on this rate Check an 8 hr heparin level and aPTT Daily heparin level, CBC and aPTT  Morgan Cooper, Rande Lawman 01/06/2017,11:29 AM

## 2017-01-07 ENCOUNTER — Inpatient Hospital Stay (HOSPITAL_COMMUNITY): Payer: Medicare Other

## 2017-01-07 LAB — GLUCOSE, CAPILLARY
GLUCOSE-CAPILLARY: 255 mg/dL — AB (ref 65–99)
GLUCOSE-CAPILLARY: 262 mg/dL — AB (ref 65–99)
Glucose-Capillary: 249 mg/dL — ABNORMAL HIGH (ref 65–99)
Glucose-Capillary: 243 mg/dL — ABNORMAL HIGH (ref 65–99)
Glucose-Capillary: 217 mg/dL — ABNORMAL HIGH (ref 65–99)
Glucose-Capillary: 229 mg/dL — ABNORMAL HIGH (ref 65–99)

## 2017-01-07 LAB — BLOOD GAS, ARTERIAL
ACID-BASE DEFICIT: 5.8 mmol/L — AB (ref 0.0–2.0)
Bicarbonate: 19.7 mmol/L — ABNORMAL LOW (ref 20.0–28.0)
DRAWN BY: 511841
FIO2: 90
O2 SAT: 98.2 %
PCO2 ART: 42.6 mmHg (ref 32.0–48.0)
PEEP/CPAP: 14 cmH2O
PO2 ART: 123 mmHg — AB (ref 83.0–108.0)
PRESSURE CONTROL: 18 cmH2O
Patient temperature: 98.3
pH, Arterial: 7.287 — ABNORMAL LOW (ref 7.350–7.450)

## 2017-01-07 LAB — POCT I-STAT 3, ART BLOOD GAS (G3+)
ACID-BASE DEFICIT: 6 mmol/L — AB (ref 0.0–2.0)
Acid-base deficit: 7 mmol/L — ABNORMAL HIGH (ref 0.0–2.0)
BICARBONATE: 19.9 mmol/L — AB (ref 20.0–28.0)
Bicarbonate: 20.6 mmol/L (ref 20.0–28.0)
O2 SAT: 90 %
O2 Saturation: 85 %
PH ART: 7.29 — AB (ref 7.350–7.450)
PO2 ART: 67 mmHg — AB (ref 83.0–108.0)
TCO2: 21 mmol/L — AB (ref 22–32)
TCO2: 22 mmol/L (ref 22–32)
pCO2 arterial: 42.9 mmHg (ref 32.0–48.0)
pCO2 arterial: 43.4 mmHg (ref 32.0–48.0)
pH, Arterial: 7.267 — ABNORMAL LOW (ref 7.350–7.450)
pO2, Arterial: 56 mmHg — ABNORMAL LOW (ref 83.0–108.0)

## 2017-01-07 LAB — CULTURE, RESPIRATORY: CULTURE: NORMAL

## 2017-01-07 LAB — BASIC METABOLIC PANEL
Anion gap: 11 (ref 5–15)
BUN: 64 mg/dL — AB (ref 6–20)
CALCIUM: 8.4 mg/dL — AB (ref 8.9–10.3)
CO2: 21 mmol/L — ABNORMAL LOW (ref 22–32)
CREATININE: 3.14 mg/dL — AB (ref 0.44–1.00)
Chloride: 101 mmol/L (ref 101–111)
GFR calc Af Amer: 16 mL/min — ABNORMAL LOW (ref >60)
GFR, EST NON AFRICAN AMERICAN: 14 mL/min — AB (ref >60)
Glucose, Bld: 270 mg/dL — ABNORMAL HIGH (ref 65–99)
POTASSIUM: 3.8 mmol/L (ref 3.5–5.1)
SODIUM: 133 mmol/L — AB (ref 135–145)

## 2017-01-07 LAB — CULTURE, RESPIRATORY W GRAM STAIN

## 2017-01-07 LAB — PROCALCITONIN: PROCALCITONIN: 1.49 ng/mL

## 2017-01-07 LAB — HEPARIN LEVEL (UNFRACTIONATED): Heparin Unfractionated: >2.2 IU/mL — ABNORMAL HIGH (ref 0.30–0.70)

## 2017-01-07 LAB — APTT
APTT: 68 s — AB (ref 24–36)
aPTT: 75 seconds — ABNORMAL HIGH (ref 24–36)

## 2017-01-07 MED ORDER — INSULIN GLARGINE 100 UNIT/ML ~~LOC~~ SOLN
30.0000 [IU] | Freq: Every day | SUBCUTANEOUS | Status: DC
  Administered 2017-01-07: 30 [IU] via SUBCUTANEOUS
  Filled 2017-01-07: qty 0.3

## 2017-01-07 MED ORDER — DEXMEDETOMIDINE HCL IN NACL 400 MCG/100ML IV SOLN
0.0000 ug/kg/h | INTRAVENOUS | Status: DC
  Administered 2017-01-08 – 2017-01-09 (×6): 1.2 ug/kg/h via INTRAVENOUS
  Administered 2017-01-09: 1 ug/kg/h via INTRAVENOUS
  Filled 2017-01-07 (×6): qty 100

## 2017-01-07 MED ORDER — FUROSEMIDE 10 MG/ML IJ SOLN
80.0000 mg | Freq: Once | INTRAMUSCULAR | Status: AC
  Administered 2017-01-07: 80 mg via INTRAVENOUS
  Filled 2017-01-07: qty 8

## 2017-01-07 MED ORDER — SODIUM CHLORIDE 0.9 % IV SOLN
0.0000 ug/kg/h | INTRAVENOUS | Status: DC
  Administered 2017-01-07: 1.2 ug/kg/h via INTRAVENOUS
  Administered 2017-01-07: 0.4 ug/kg/h via INTRAVENOUS
  Administered 2017-01-07: 1.2 ug/kg/h via INTRAVENOUS
  Administered 2017-01-07: 0.5 ug/kg/h via INTRAVENOUS
  Administered 2017-01-07: 1.2 ug/kg/h via INTRAVENOUS
  Filled 2017-01-07 (×5): qty 2

## 2017-01-07 MED ORDER — FUROSEMIDE 10 MG/ML IJ SOLN
80.0000 mg | Freq: Three times a day (TID) | INTRAMUSCULAR | Status: DC
  Administered 2017-01-07 – 2017-01-08 (×2): 80 mg via INTRAVENOUS
  Filled 2017-01-07 (×3): qty 8

## 2017-01-07 NOTE — Progress Notes (Signed)
ANTICOAGULATION CONSULT NOTE - Follow Up Consult  Pharmacy Consult for heparin Indication: atrial fibrillation  Labs: Recent Labs    01/04/17 0951 01/05/17 0245 01/05/17 1656 01/06/17 0508 01/06/17 2000 01/07/17 0511  HGB 11.5* 11.1*  --  9.7*  --   --   HCT 33.8* 33.7*  --  29.7*  --   --   PLT 166 257  --  232  --   --   APTT  --   --   --   --  58* 68*  HEPARINUNFRC  --   --   --   --  >2.20*  --   CREATININE 0.90 0.95 1.67* 2.20*  --   --     Assessment/Plan:  71yo female therapeutic on heparin after rate change. Will continue gtt at current rate and confirm stable with additional PTT.   Wynona Neat, PharmD, BCPS  01/07/2017,5:44 AM

## 2017-01-07 NOTE — Progress Notes (Signed)
ANTICOAGULATION CONSULT NOTE  Pharmacy Consult for heparin Indication: atrial fibrillation  Patient Measurements: Height: 5\' 5"  (165.1 cm) Weight: 117 lb 11.6 oz (53.4 kg) IBW/kg (Calculated) : 57 Heparin Dosing Weight: 51.9kg  Vital Signs: Temp: 98.3 F (36.8 C) (11/18 0855) Temp Source: Oral (11/18 0855) BP: 87/58 (11/18 1200) Pulse Rate: 90 (11/18 1230)  Labs: Recent Labs    01/05/17 0245 01/05/17 1656 01/06/17 0508 01/06/17 2000 01/07/17 0511 01/07/17 1156  HGB 11.1*  --  9.7*  --   --   --   HCT 33.7*  --  29.7*  --   --   --   PLT 257  --  232  --   --   --   APTT  --   --   --  58* 68* 75*  HEPARINUNFRC  --   --   --  >2.20* >2.20*  --   CREATININE 0.95 1.67* 2.20*  --  3.14*  --     Estimated Creatinine Clearance: 13.7 mL/min (A) (by C-G formula based on SCr of 3.14 mg/dL (H)).  Medications:  Infusions:  . sodium chloride 10 mL/hr at 01/07/17 0800  . dexmedetomidine (PRECEDEX) IV infusion 0.5 mcg/kg/hr (01/07/17 1159)  . diltiazem (CARDIZEM) infusion 10 mg/hr (01/07/17 1153)  . feeding supplement (VITAL AF 1.2 CAL) 1,000 mL (01/07/17 0800)  . fentaNYL infusion INTRAVENOUS 50 mcg/hr (01/07/17 1237)  . heparin 1,100 Units/hr (01/07/17 0800)  . piperacillin-tazobactam (ZOSYN)  IV Stopped (01/07/17 1023)    Assessment: 83 yof presented with dyspnea and near syncope. She was on apixaban but now transitioned to IV heparin. Hgb is low at 9.7 and platelets are WNL. Pt with worsening renal function.   APTT remains therapeutic at 75. No bleeding noted.   Goal of Therapy:  Heparin level 0.3-0.7 units/ml aPTT 66-102 seconds Monitor platelets by anticoagulation protocol: Yes   Plan:  Continue heparin gtt 1100 units/hr Daily aPTT, heparin level and CBC  Salome Arnt, PharmD, BCPS Phone #: [phone] until 3:30pm All other times, call [place] x [phone] 01/07/2017 1:03 PM

## 2017-01-07 NOTE — Consult Note (Signed)
Renal Service Consult Note [place] 01/07/2017 [place] Requesting Physician:  Dr Halford Chessman  Reason for Consult:  AKI, renal transplant HPI: The patient is a 71 y.o. year-old with hx of esrd sp renal transplant (LRD, 1984, pred/ imuran) presented on 11/14 with progressive SOB/ DOE and some mild CP.  Also recently found to have new afib recently started on metoprolol and Eliquis by her cardiologist. Seen by EMS with O2 sat of 76% at home, improved to 88% on [place].  Hx of diast CHF in the past, not on diuretics anymore.  BNP was 2000, she was treated initially with IV diltiazem for afib /RVR, was not felt to be vol overloaded.  CT angio w/ contrast was done which showed no PE but showed RUL air space disease, pulm edeam vs infection.  Lactic acid was 1.45, WBC 9k.  She was admitted and rx'd with IV diltiazem, po metoprolol, IV abx (rocephin, vanc/ zosyn) for PNA, NRB mask and duoneb.  Pt decompensated requiring intubation on 11/15. Creat on admit was 0.8, then went up to 2.2 on 11/16 and 3.14 today.  UOP has been 200- 350 cc/ d the last few days.   Weights are up to 53kg from 50 kg on admit.  CXR showing diffuse bilat infiltrates, R> L .  BP's are low, on IV dilt. Was on levo gtt for abotu 24 hrs but off now for last 48 hrs. BP's in low 100 range.  ECHO here showed normal LVEF. No fevers, WBC 8 > 12k.  Asked to see for AKI.     Pt intubated, cannot give history.    Admitted here in May 2016 with acute transplatn pyelonephritis and treated with IV abx, had some Tx hydro to be f/u with OP urology.   In Oct 2017 had slowly enlarging pancreatic cyst and underwent resection of the panc mass/ cyst (distal pancreatectomy).   In Nov 2017 she was admitted for drainage of a fluid collection LUQ which happened as a consequence of distal pancreatectomy as above.     ROS  n/a   Past Medical History  Past Medical History:  Diagnosis Date  . Anxiety   . Arthritis    hips, legs, hand   . Barrett's esophagus   . Bone pain   . Bruises easily   . Cancer ([place])    skin cancer that has been removed- L hand  . CHF (congestive heart failure) ([place])   . Chills   . CVA (cerebral infarction)    [place] Strokes   . Depression   . Disturbed concentration   . Diverticulitis of large intestine with perforation Hx colectomy  . Esophageal stricture   . Fatigue   . Forgetfulness   . Gastroparesis   . Generalized headaches   . GERD (gastroesophageal reflux disease)   . Heart failure, diastolic, chronic ([place])   . History of hiatal hernia   . Hx of cardiovascular stress test    Lexiscan Myoview (06/2013):  No ischemia, EF 72%; Low Risk  . Hyperlipemia   . Hypertension   . Irritable   . Itch    skin  . Keratosis   . Macular degeneration    left eye  . Nausea    little vomitting  . Osteoporosis   . Pancreatic cyst   . Pancreatic mass   . Panic attacks   . Persistent headaches   . Pulmonary hypertension ([place])   . Rapid heart rate  some times  . Renal failure    transplant - 1983,  followed by Dr. Justin Mend   . Sleep apnea    uses at bedtime- 2liters/min , negative- apnea., last study- 2/017  . Sleep difficulties   . Stroke ([place])   . TIA (transient ischemic attack)    Past Surgical History  Past Surgical History:  Procedure Laterality Date  . APPENDECTOMY    . cataract surgery bilateral    . COLON RESECTION  2010   Colostomy  . COLOSTOMY  2011   Reversal   . colostomy reversal    . ESOPHAGEAL DILATION    . HAND ASSISTED LAPAROSCOPIC DISTAL PANCREATECTOMY N/A 12/07/2015   Performed by Stark Klein, MD at [facility]  . IR GENERIC HISTORICAL  01/06/2016   IR RADIOLOGIST EVAL & MGMT 01/06/2016 Greggory Keen, MD [facility] INTERV RAD  . IR GENERIC HISTORICAL  01/20/2016   IR RADIOLOGIST EVAL & MGMT 01/20/2016 Sandi Mariscal, MD [facility] INTERV RAD  . IR GENERIC HISTORICAL  02/03/2016   IR RADIOLOGIST EVAL & MGMT 02/03/2016 Saverio Danker, PA-C [facility] INTERV RAD  .  IR GENERIC HISTORICAL  02/24/2016   IR RADIOLOGIST EVAL & MGMT 02/24/2016 Corrie Mckusick, DO [facility] INTERV RAD  . IR GENERIC HISTORICAL  03/23/2016   IR RADIOLOGIST EVAL & MGMT 03/23/2016 Ardis Rowan, PA-C [facility] INTERV RAD  . IR GENERIC HISTORICAL  04/06/2016   IR RADIOLOGIST EVAL & MGMT 04/06/2016 Darrell K Allred, PA-C [facility] INTERV RAD  . IR GENERIC HISTORICAL  04/20/2016   IR RADIOLOGIST EVAL & MGMT 04/20/2016 Greggory Keen, MD [facility] INTERV RAD  . IR RADIOLOGIST EVAL & MGMT  05/23/2016  . IR RADIOLOGIST EVAL & MGMT  05/03/2016  . KIDNEY TRANSPLANT  1984  . left arm surgery     due to injury, two plates in place   . PARATHYROIDECTOMY  11/04/2010   left inferior parathyroid   Family History  Family History  Problem Relation Age of Onset  . Lung cancer Sister   . Heart disease Mother   . Stroke Father   . Aneurysm Brother   . Colon cancer Maternal Aunt 72  . Cancer Unknown   . Esophageal cancer Neg Hx   . Rectal cancer Neg Hx   . Stomach cancer Neg Hx   . Breast cancer Neg Hx    Social History  reports that she quit smoking about 27 years ago. Her smoking use included cigarettes. She has a 5.00 pack-year smoking history. she has never used smokeless tobacco. She reports that she does not drink alcohol or use drugs. Allergies  Allergies  Allergen Reactions  . Amoxicillin Diarrhea    Has patient had a PCN reaction causing immediate rash, facial/tongue/throat swelling, SOB or lightheadedness with hypotension: No Has patient had a PCN reaction causing severe rash involving mucus membranes or skin necrosis: No Has patient had a PCN reaction that required hospitalization No Has patient had a PCN reaction occurring within the last 10 years: Yes If all of the above answers are "NO", then may proceed with Cephalosporin use.   . Tape Rash   Home medications Prior to Admission medications   Medication Sig Start Date End Date Taking? Authorizing Provider  apixaban (ELIQUIS) 5 MG TABS tablet  Take 1 tablet (5 mg total) 2 (two) times daily by mouth. 12/28/16  Yes Jettie Booze, MD  atorvastatin (LIPITOR) 20 MG tablet Take 20 mg by mouth daily.    Yes [provider]  azaTHIOprine (  IMURAN) 50 MG tablet Take 50 mg by mouth daily.    Yes [provider]  cycloSPORINE (RESTASIS) 0.05 % ophthalmic emulsion Place 1 drop into both eyes 2 (two) times daily.     Yes [provider]  HYDROcodone-acetaminophen (NORCO) 10-325 MG per tablet Take 1 tablet by mouth every 6 (six) hours as needed (pain).    Yes [provider]  metoprolol tartrate (LOPRESSOR) 50 MG tablet Take 2 tablets (100 mg total) 2 (two) times daily by mouth. 12/28/16  Yes Jettie Booze, MD  omeprazole (PRILOSEC) 40 MG capsule Take 40 mg 2 (two) times daily by mouth.    Yes [provider]  OXYGEN Inhale 1 L into the lungs See admin instructions. Use at bedtime and as needed during activity   Yes [provider]  predniSONE (DELTASONE) 5 MG tablet Take 7.5 mg by mouth daily with breakfast.  05/04/15  Yes [provider]  QUEtiapine (SEROQUEL) 200 MG tablet Take 200 mg by mouth at bedtime.    Yes [provider]  trimethoprim (TRIMPEX) 100 MG tablet Take 1 tablet (100 mg total) by mouth at bedtime. continuous 01/06/16  Yes Stark Klein, MD  amLODipine (NORVASC) 5 MG tablet Take 1 tablet (5 mg total) by mouth daily. 07/11/16 10/09/16  Jettie Booze, MD  FLUoxetine (PROZAC) 20 MG capsule Take 60 mg by mouth daily.     [provider]  ondansetron (ZOFRAN) 4 MG tablet Take 1 tablet (4 mg total) by mouth every 8 (eight) hours as needed for nausea or vomiting. Patient not taking: Reported on 08/15/2016 06/02/15   Mauri Pole, MD   Liver Function Tests Recent Labs  Lab 01/03/17 0708 01/04/17 0951 01/06/17 0508  AST 27 29 21   ALT 18 15 10*  ALKPHOS 65 59 62  BILITOT 1.1 1.5* 1.0  PROT 6.4* 6.5 6.2*  ALBUMIN 3.3* 3.0* 2.4*   No  results for input(s): LIPASE, AMYLASE in the last 168 hours. CBC Recent Labs  Lab 01/03/17 0708  01/04/17 0951 01/05/17 0245 01/06/17 0508  WBC 9.0   < > 11.1* 14.2* 12.0*  NEUTROABS 6.4  --  9.1* 12.5*  --   HGB 11.3*   < > 11.5* 11.1* 9.7*  HCT 34.7*   < > 33.8* 33.7* 29.7*  MCV 94.3   < > 93.6 94.7 93.1  PLT 197   < > 166 257 232   < > = values in this interval not displayed.   Basic Metabolic Panel Recent Labs  Lab 01/03/17 0708 01/04/17 0432 01/04/17 0951 01/05/17 0245 01/05/17 1150 01/05/17 1656 01/06/17 0508 01/06/17 1557 01/07/17 0511  NA 135 135 136 136  --  134* 132*  --  133*  K 4.3 3.4* 4.1 3.6  --  4.1 3.8  --  3.8  CL 104 99* 102 103  --  102 100*  --  101  CO2 22 23 25 24   --  24 21*  --  21*  GLUCOSE 182* 183* 188* 254*  --  295* 365*  --  270*  BUN 10 7 11 14   --  28* 41*  --  64*  CREATININE 0.99 0.80 0.90 0.95  --  1.67* 2.20*  --  3.14*  CALCIUM 8.6* 8.7* 8.7* 8.4*  --  8.8* 8.4*  --  8.4*  PHOS  --   --   --  3.5 2.6 2.9 3.9 4.1  --    Iron/TIBC/Ferritin/ %Sat  Component Value Date/Time   IRON 121 06/02/2015 0932   TIBC 224 (L) 09/10/2008 0645   FERRITIN 812.1 (H) 06/02/2015 0932   IRONPCTSAT 38.6 06/02/2015 0932    Vitals:   01/07/17 1600 01/07/17 1630 01/07/17 1700 01/07/17 1730  BP: 109/74  99/61   Pulse: 95 (!) 104 88 92  Resp: (!) 21 (!) 27 (!) 23 (!) 26  Temp: 98.8 F (37.1 C)     TempSrc: Oral     SpO2: 97% 97% 98% 97%  Weight:      Height:       Exam Gen on the vent, sedated, not responding No rash, cyanosis or gangrene Sclera anicteric, throat w ETT  No jvd or bruits Chest some coarse BS bilat Cor irreg irreg no mrg Abd soft ntnd no mass or ascites +bs GU foley in place, clear urine MS no joint effusions or deformity Ext no sig LE or UE edema / no wounds or ulcers Neuro is sedated on the vent   Current meds : lipitor/ insulin/ solucortef/ lantus/ nebs/ PPI/ IV precedex/ IV dilt/ TF's/ fentanyl drip/ IV hep drip/  IV zosyn tid/ prn versed Vanc level 11/17 was 15  Impression: 1. AKI - likely due to IV contrast nephropathy.  Vanc levels not high.  Should recover in time.  2. Resp failure / multifocal PNA / hx pulm fibrosis and COPD - on vent 3. Volume - difficult to tell, CVP's not real high , no gross edema on exam. BNP was high though.  Conflicting results.  Agree w/ trial of diuresis.   4. Pulm fibrosis/ COPD - longstanding, f/b pulm 5. Renal transplant - doubt rejection, creat was normal pre CT angio   Plan - will follow.     Kelly Splinter MD [employer] pager [phone]   01/07/2017, 6:11 PM

## 2017-01-07 NOTE — Progress Notes (Signed)
RT called due to pt desat in low 80's. Pt appeared uncomfortable on vent settings. Pt tried on CPAP/PS 14/10 70%. Pt more comfortable RR 12-16 MV 10-12. ABG obtained and MD aware of results. RT will continue to monitor

## 2017-01-07 NOTE — Progress Notes (Signed)
PULMONARY / CRITICAL CARE MEDICINE   Name: Morgan Cooper MRN: [id number] DOB: 02/17/1946    ADMISSION DATE:  01/03/2017 CONSULTATION DATE:  01/04/17  REFERRING MD:  TRH  CHIEF COMPLAINT:  SOB  HISTORY OF PRESENT ILLNESS:   71 yo female former smoker presented with dyspnea and hypoxia.  She had A fib with RVR and concern for HCAP with progression pulmonary infiltrates.  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  SUBJECTIVE:  Increasing PEEP and FiO2 needs overnight.  VITAL SIGNS: BP (!) 102/51   Pulse 82   Temp 98.1 F (36.7 C) (Oral)   Resp 17   Ht 5\' 5"  (1.651 m)   Wt 117 lb 11.6 oz (53.4 kg)   SpO2 (!) 87%   BMI 19.59 kg/m   HEMODYNAMICS: CVP:  [13 mmHg-17 mmHg] 14 mmHg  VENTILATOR SETTINGS: Vent Mode: PRVC FiO2 (%):  [50 %-80 %] 70 % Set Rate:  [18 bmp] 18 bmp Vt Set:  [460 mL] 460 mL PEEP:  [10 cmH20] 10 cmH20 Pressure Support:  [14 cmH20] 14 cmH20 Plateau Pressure:  [19 cmH20-28 cmH20] 19 cmH20  INTAKE / OUTPUT: I/O last 3 completed shifts: In: 3614 [I.V.:1579; NG/GT:1615; IV Piggyback:200] Out: 431 [Urine:494]  PHYSICAL EXAMINATION:  General - increased WOB Eyes - pupils reactive ENT - ETT in place Cardiac - irregular, no murmur Chest - b/l rales Abd - soft, non tender Ext - 1+ edema Skin - no rashes Neuro - RASS -2    LABS:  BMET Recent Labs  Lab 01/05/17 1656 01/06/17 0508 01/07/17 0511  NA 134* 132* 133*  K 4.1 3.8 3.8  CL 102 100* 101  CO2 24 21* 21*  BUN 28* 41* 64*  CREATININE 1.67* 2.20* 3.14*  GLUCOSE 295* 365* 270*    Electrolytes Recent Labs  Lab 01/05/17 1656 01/06/17 0508 01/06/17 1557 01/07/17 0511  CALCIUM 8.8* 8.4*  --  8.4*  MG 2.9* 2.7* 2.8*  --   PHOS 2.9 3.9 4.1  --     CBC Recent Labs  Lab  01/04/17 0951 01/05/17 0245 01/06/17 0508  WBC 11.1* 14.2* 12.0*  HGB 11.5* 11.1* 9.7*  HCT 33.8* 33.7* 29.7*  PLT 166 257 232    Coag's Recent Labs  Lab 01/03/17 0708 01/06/17 2000 01/07/17 0511  APTT  --  58* 68*  INR 1.59  --   --     Sepsis Markers Recent Labs  Lab 01/03/17 1004 01/03/17 1158 01/05/17 0245 01/05/17 1656 01/06/17 0508 01/07/17 0511  LATICACIDVEN 1.45 1.70  --  1.9  --   --   PROCALCITON  --   --  0.52  --  1.59 1.49    ABG Recent Labs  Lab 01/06/17 0435 01/07/17 0044 01/07/17 0446  PHART 7.324* 7.290* 7.267*  PCO2ART 41.1 42.9 43.4  PO2ART 71.6* 67.0* 56.0*    Liver Enzymes Recent Labs  Lab 01/03/17 0708 01/04/17 0951 01/06/17 0508  AST 27 29 21   ALT 18 15 10*  ALKPHOS 65 59 62  BILITOT 1.1 1.5* 1.0  ALBUMIN 3.3* 3.0* 2.4*    Cardiac Enzymes No results for input(s): TROPONINI, PROBNP in the last 168 hours.  Glucose Recent Labs  Lab 01/06/17 1205 01/06/17 1609 01/06/17 1937 01/06/17 2200 01/07/17 0025 01/07/17 0333  GLUCAP 307* 214* 237* 264* 255* 262*    Imaging Dg  Chest Port 1 View  Result Date: 01/07/2017 CLINICAL DATA:  Respiratory failure EXAM: PORTABLE CHEST 1 VIEW COMPARISON:  01/06/2017 FINDINGS: Support devices are stable. Severe diffuse bilateral airspace disease has worsened since prior study. Cardiomegaly. Probable small left effusion. No acute bony abnormality. IMPRESSION: Worsening severe diffuse bilateral airspace disease which could reflect edema or infection. Suspect small left effusion. Electronically Signed   By: Rolm Baptise M.D.   On: 01/07/2017 08:19     STUDIES:  CTA PE 11/14 >> no PE, diffuse RUL airspace disease superimposed on emphysema and ILD Echo 11/15 >> EF 55 to 60%, PAS 39 mmHg  CULTURES: Legionella Ag 11/14 >> negative Pneumococcal Ag 11/14 >> negative Influenza PCR 11/14 >> negative Blood 11/14 >>  Respiratory viral panel 11/15 >> negative Sputum 11/16 >> PCP 11/16 >>    ANTIBIOTICS: Zithromax 11/14 >> 11/25 Rocephin 11/14 >> 11/15 Vancomycin 11/14 >> 11/18 Zosyn 11/15 >>   SIGNIFICANT EVENTS: 11/14 Admit 11/16 to ICU 11/18 Renal consulted  LINES/TUBES: ETT 11/16 >> Rt IJ CVL 11/16 >> Lt radial aline 11/16 >>   DISCUSSION: 71 yo female with VDRF from HCAP, pulmonary edema in setting of A fib with RVR and acute on chronic diastolic CHF.  Has hx of COPD, ILD, and renal transplant.  ASSESSMENT / PLAN:  Acute on chronic hypoxic respiratory failure. - HCAP, pulmonary edema, COPD, and ILD - worsening respiratory status 11/18 likely from progressive pulmonary edema - changed to pressure control 11/18 - f/u CXR, ABG - day 5/7 Abx >> d/c vancomycin 11/18 - scheduled BDs  A fib with RVR. Acute on chronic diastolic CHF. - changed to heparin gtt in place of eliquis with worsening renal fx - diltiazem gtt per cardiology - lasix 80 mg IV x one  - continue lipitor  Hx of renal transplant. Acute renal failure with ATN. - worsening renal function >> will consult nephrology - continue solu cortef - hold imuran in setting of infectious process  Severe protein calorie malnutrition. - continue tube feeds  Steroid induced hyperglycemia. - resistant scale for SSI - continue lantus 30 units daily  Anemia of critical illness and chronic disease. - f/u CBC  Acute metabolic encephalopathy. - RASS goal 0 to -1  DVT prophylaxis - heparin gtt SUP - protonix Nutrition - tube feeds Goals of care - full code  Updated family at bedside  CC time 33 minutes  Chesley Mires, MD [place] 01/07/2017, 8:59 AM Pager:  [phone] After 3pm call: [phone]

## 2017-01-08 ENCOUNTER — Inpatient Hospital Stay (HOSPITAL_COMMUNITY): Payer: Medicare Other

## 2017-01-08 LAB — BASIC METABOLIC PANEL
Anion gap: 12 (ref 5–15)
BUN: 90 mg/dL — AB (ref 6–20)
CHLORIDE: 101 mmol/L (ref 101–111)
CO2: 20 mmol/L — AB (ref 22–32)
Calcium: 8.4 mg/dL — ABNORMAL LOW (ref 8.9–10.3)
Creatinine, Ser: 3.84 mg/dL — ABNORMAL HIGH (ref 0.44–1.00)
GFR calc Af Amer: 13 mL/min — ABNORMAL LOW (ref >60)
GFR calc non Af Amer: 11 mL/min — ABNORMAL LOW (ref >60)
GLUCOSE: 269 mg/dL — AB (ref 65–99)
POTASSIUM: 4.3 mmol/L (ref 3.5–5.1)
Sodium: 133 mmol/L — ABNORMAL LOW (ref 135–145)

## 2017-01-08 LAB — HEMOGLOBIN AND HEMATOCRIT, BLOOD
HCT: 26.2 % — ABNORMAL LOW (ref 36.0–46.0)
HEMOGLOBIN: 8.7 g/dL — AB (ref 12.0–15.0)

## 2017-01-08 LAB — CBC
HCT: 25.8 % — ABNORMAL LOW (ref 36.0–46.0)
HEMOGLOBIN: 8.5 g/dL — AB (ref 12.0–15.0)
MCH: 30 pg (ref 26.0–34.0)
MCHC: 32.9 g/dL (ref 30.0–36.0)
MCV: 91.2 fL (ref 78.0–100.0)
Platelets: 152 10*3/uL (ref 150–400)
RBC: 2.83 MIL/uL — AB (ref 3.87–5.11)
RDW: 14.7 % (ref 11.5–15.5)
WBC: 6.4 10*3/uL (ref 4.0–10.5)

## 2017-01-08 LAB — HEPARIN LEVEL (UNFRACTIONATED): Heparin Unfractionated: >2.2 IU/mL — ABNORMAL HIGH (ref 0.30–0.70)

## 2017-01-08 LAB — GLUCOSE, CAPILLARY
GLUCOSE-CAPILLARY: 259 mg/dL — AB (ref 65–99)
GLUCOSE-CAPILLARY: 210 mg/dL — AB (ref 65–99)
Glucose-Capillary: 289 mg/dL — ABNORMAL HIGH (ref 65–99)
Glucose-Capillary: 251 mg/dL — ABNORMAL HIGH (ref 65–99)
Glucose-Capillary: 198 mg/dL — ABNORMAL HIGH (ref 65–99)
Glucose-Capillary: 160 mg/dL — ABNORMAL HIGH (ref 65–99)

## 2017-01-08 LAB — APTT: aPTT: 83 seconds — ABNORMAL HIGH (ref 24–36)

## 2017-01-08 LAB — BLOOD GAS, ARTERIAL
Acid-base deficit: 6.1 mmol/L — ABNORMAL HIGH (ref 0.0–2.0)
Bicarbonate: 18.4 mmol/L — ABNORMAL LOW (ref 20.0–28.0)
DRAWN BY: 418751
FIO2: 60
O2 SAT: 99.4 %
PATIENT TEMPERATURE: 98.6
PCO2 ART: 33.9 mmHg (ref 32.0–48.0)
PEEP/CPAP: 14 cmH2O
PO2 ART: 179 mmHg — AB (ref 83.0–108.0)
Pressure control: 18 cmH2O
RATE: 26 resp/min
pH, Arterial: 7.354 (ref 7.350–7.450)

## 2017-01-08 LAB — CULTURE, BLOOD (ROUTINE X 2)
Culture: NO GROWTH
SPECIAL REQUESTS: ADEQUATE

## 2017-01-08 LAB — TYPE AND SCREEN
ABO/RH(D): AB POS
Antibody Screen: NEGATIVE

## 2017-01-08 LAB — PNEUMOCYSTIS JIROVECI SMEAR BY DFA: Pneumocystis jiroveci Ag: NEGATIVE

## 2017-01-08 MED ORDER — DOCUSATE SODIUM 50 MG/5ML PO LIQD
100.0000 mg | Freq: Two times a day (BID) | ORAL | Status: DC
  Administered 2017-01-08 – 2017-01-10 (×5): 100 mg
  Filled 2017-01-08 (×11): qty 10

## 2017-01-08 MED ORDER — METOPROLOL TARTRATE 5 MG/5ML IV SOLN
2.5000 mg | Freq: Once | INTRAVENOUS | Status: AC
  Administered 2017-01-08: 2.5 mg via INTRAVENOUS
  Filled 2017-01-08: qty 5

## 2017-01-08 MED ORDER — SENNOSIDES 8.8 MG/5ML PO SYRP
5.0000 mL | ORAL_SOLUTION | Freq: Two times a day (BID) | ORAL | Status: DC
  Administered 2017-01-08 – 2017-01-10 (×5): 5 mL
  Filled 2017-01-08 (×11): qty 5

## 2017-01-08 MED ORDER — INSULIN GLARGINE 100 UNIT/ML ~~LOC~~ SOLN
45.0000 [IU] | Freq: Every day | SUBCUTANEOUS | Status: DC
Start: 2017-01-08 — End: 2017-01-09
  Administered 2017-01-08: 45 [IU] via SUBCUTANEOUS
  Filled 2017-01-08: qty 0.45

## 2017-01-08 NOTE — Progress Notes (Signed)
ANTICOAGULATION & ANTIMICROBIAL CONSULT NOTE  Pharmacy Consult for heparin; Zosyn Indication: atrial fibrillation; pneumonia  Patient Measurements: Height: 5\' 5"  (165.1 cm) Weight: 121 lb 11.1 oz (55.2 kg) IBW/kg (Calculated) : 57 Heparin Dosing Weight: 51.9kg  Vital Signs: Temp: 99 F (37.2 C) (11/19 1213) Temp Source: Oral (11/19 1213) BP: 109/70 (11/19 1400) Pulse Rate: 110 (11/19 1400)  Labs: Recent Labs    01/06/17 0508  01/06/17 2000 01/07/17 0511 01/07/17 1156 01/08/17 0359  HGB 9.7*  --   --   --   --  8.5*  HCT 29.7*  --   --   --   --  25.8*  PLT 232  --   --   --   --  152  APTT  --    < > 58* 68* 75* 83*  HEPARINUNFRC  --   --  >2.20* >2.20*  --  >2.20*  CREATININE 2.20*  --   --  3.14*  --  3.84*   < > = values in this interval not displayed.    Estimated Creatinine Clearance: 11.5 mL/min (A) (by C-G formula based on SCr of 3.84 mg/dL (H)).  Medications:  Infusions:  . sodium chloride 10 mL/hr at 01/08/17 0800  . dexmedetomidine (PRECEDEX) IV infusion 1.2 mcg/kg/hr (01/08/17 1400)  . diltiazem (CARDIZEM) infusion 15 mg/hr (01/08/17 1400)  . feeding supplement (VITAL AF 1.2 CAL) 1,000 mL (01/08/17 0800)  . fentaNYL infusion INTRAVENOUS 200 mcg/hr (01/08/17 1400)  . heparin 1,100 Units/hr (01/08/17 1400)  . piperacillin-tazobactam (ZOSYN)  IV Stopped (01/08/17 1059)    Assessment: 48 yof presented with dyspnea and near syncope. She was on apixaban but now transitioned to IV heparin and on Zosyn for pneumonia.   Anticoagulation: APTT remains therapeutic at 83. HL still elevated due to recent apixaban. Using aPTT for now. Hgb down this AM at 8.5,. Platelets down at 152. No overt bleeding noted. CT Abd shows 5.4 x 2.8 cm fluid collection in pancreatectomy bed but unable to give contrast to better visualize. Discussed with Dr. Ashok Cordia and plan is to continue heparin for now and repeat H/H this PM. SCr still rising but making urine.   Infectious disease: On  day # 4 out of 7 of Zosyn for pneumonia. Tmax 100.1. WBC is within normal limits. PCT 0.52 on last check. LA within normal limits. SCr continues to rise at 3.84 with BUN 90. Patient has history of renal transplant in 1983 and was on azathioprine prior to admission but this is currently on hold in setting of infection. Patient is making good urine at 0.8 mL/kg/hr.   Goal of Therapy:  Heparin level 0.3-0.7 units/ml aPTT 66-102 seconds Monitor platelets by anticoagulation protocol: Yes   Plan:  Continue heparin gtt 1100 units/hr Daily aPTT, heparin level and CBC  Continue Zosyn 2.25g IV every 8 hours.  Monitor renal plans, clinical status, and culture results.  Stop date added for 01/11/17.   Sloan Leiter, PharmD, BCPS, BCCCP Clinical Pharmacist Clinical phone 01/08/2017 until 3:30PM [phone] After hours, please call [phone] 01/08/2017 3:01 PM

## 2017-01-08 NOTE — Progress Notes (Signed)
Progress Note  Patient Name: Morgan Cooper Date of Encounter: 01/08/2017  Primary Cardiologist: Dr Irish Lack  Subjective   Pt intubated; somewhat agitated; opens eyes  Inpatient Medications    Scheduled Meds: . atorvastatin  20 mg Per Tube Daily  . chlorhexidine gluconate (MEDLINE KIT)  15 mL Mouth Rinse BID  . cycloSPORINE  1 drop Both Eyes BID  . furosemide  80 mg Intravenous Q8H  . hydrocortisone sod succinate (SOLU-CORTEF) inj  50 mg Intravenous Q6H  . insulin aspart  0-20 Units Subcutaneous Q4H  . insulin glargine  45 Units Subcutaneous QHS  . ipratropium  0.5 mg Nebulization Q6H  . levalbuterol  0.63 mg Nebulization Q6H  . mouth rinse  15 mL Mouth Rinse QID  . pantoprazole sodium  40 mg Per Tube Q24H   Continuous Infusions: . sodium chloride 10 mL/hr at 01/08/17 0700  . dexmedetomidine (PRECEDEX) IV infusion 1.199 mcg/kg/hr (01/08/17 0700)  . diltiazem (CARDIZEM) infusion 15 mg/hr (01/08/17 0747)  . feeding supplement (VITAL AF 1.2 CAL) 1,000 mL (01/08/17 0700)  . fentaNYL infusion INTRAVENOUS 200 mcg/hr (01/08/17 0700)  . heparin 1,100 Units/hr (01/08/17 0700)  . piperacillin-tazobactam (ZOSYN)  IV Stopped (01/08/17 0343)   PRN Meds: Place/Maintain arterial line **AND** sodium chloride, docusate, fentaNYL, levalbuterol, midazolam, ondansetron (ZOFRAN) IV   Vital Signs    Vitals:   01/08/17 0500 01/08/17 0600 01/08/17 0700 01/08/17 0731  BP: 114/73 110/73 113/78 (!) 117/58  Pulse: (!) 102 (!) 101 (!) 108 (!) 102  Resp: (!) 9 (!) 26 (!) 26 (!) 26  Temp:      TempSrc:      SpO2: 95% 94% 93% 92%  Weight: 121 lb 11.1 oz (55.2 kg)     Height:        Intake/Output Summary (Last 24 hours) at 01/08/2017 0806 Last data filed at 01/08/2017 0700 Gross per 24 hour  Intake 2544.19 ml  Output 965 ml  Net 1579.19 ml   Filed Weights   01/06/17 0500 01/07/17 0500 01/08/17 0500  Weight: 114 lb 6.7 oz (51.9 kg) 117 lb 11.6 oz (53.4 kg) 121 lb 11.1 oz (55.2 kg)     Telemetry    Atrial fibrillation with RVR- Personally Reviewed   Physical Exam   GEN: WD/frail; agitated; intubated Neck: No obvious JVD Cardiac: irregular and tachycardic Respiratory: CTA anteriorly; no wheeze GI: Soft, ND MS: No edema Neuro:  Moves all ext; agitated   Labs    Chemistry Recent Labs  Lab 01/03/17 0708  01/04/17 3567  01/06/17 0508 01/07/17 0511 01/08/17 0359  NA 135   < > 136   < > 132* 133* 133*  K 4.3   < > 4.1   < > 3.8 3.8 4.3  CL 104   < > 102   < > 100* 101 101  CO2 22   < > 25   < > 21* 21* 20*  GLUCOSE 182*   < > 188*   < > 365* 270* 269*  BUN 10   < > 11   < > 41* 64* 90*  CREATININE 0.99   < > 0.90   < > 2.20* 3.14* 3.84*  CALCIUM 8.6*   < > 8.7*   < > 8.4* 8.4* 8.4*  PROT 6.4*  --  6.5  --  6.2*  --   --   ALBUMIN 3.3*  --  3.0*  --  2.4*  --   --   AST 27  --  29  --  21  --   --   ALT 18  --  15  --  10*  --   --   ALKPHOS 65  --  59  --  62  --   --   BILITOT 1.1  --  1.5*  --  1.0  --   --   GFRNONAA 56*   < > >60   < > 21* 14* 11*  GFRAA >60   < > >60   < > 25* 16* 13*  ANIONGAP 9   < > 9   < > _0 < > = values in this interval not displayed.     Hematology Recent Labs  Lab 01/05/17 0245 01/06/17 0508 01/08/17 0359  WBC 14.2* 12.0* 6.4  RBC 3.56* 3.19* 2.83*  HGB 11.1* 9.7* 8.5*  HCT 33.7* 29.7* 25.8*  MCV 94.7 93.1 91.2  MCH 31.2 30.4 30.0  MCHC 32.9 32.7 32.9  RDW 14.5 14.6 14.7  PLT 257 232 152     Recent Labs  Lab 01/03/17 0717  TROPIPOC 0.03     BNP Recent Labs  Lab 01/03/17 0708  BNP 2,077.8*      Radiology    Dg Chest Port 1 View  Result Date: 01/08/2017 CLINICAL DATA:  Respiratory failure EXAM: PORTABLE CHEST 1 VIEW COMPARISON:  01/07/2017 FINDINGS: Endotracheal tube in good position. NG in the stomach. Central line in the lower SVC unchanged Interval improvement in diffuse bilateral airspace disease since yesterday. Underlying COPD and scarring. No effusion or collapse. Mild  bibasilar atelectasis IMPRESSION: Significant improvement in diffuse bilateral airspace disease which may be due to improvement in edema or pneumonia. Electronically Signed   By: Franchot Gallo M.D.   On: 01/08/2017 07:12   Dg Chest Port 1 View  Result Date: 01/07/2017 CLINICAL DATA:  Respiratory failure EXAM: PORTABLE CHEST 1 VIEW COMPARISON:  01/06/2017 FINDINGS: Support devices are stable. Severe diffuse bilateral airspace disease has worsened since prior study. Cardiomegaly. Probable small left effusion. No acute bony abnormality. IMPRESSION: Worsening severe diffuse bilateral airspace disease which could reflect edema or infection. Suspect small left effusion. Electronically Signed   By: Rolm Baptise M.D.   On: 01/07/2017 08:19    Patient Profile     71 y.o. female with new onset atrial fibrillation with rapid ventricular response, possible pneumonia and acute on chronic kidney disease. Echocardiogram this admission shows normal LV systolic function, moderate biatrial enlargement and mild right ventricular enlargement.   Assessment & Plan    1 atrial fibrillation with rapid ventricular response-patient remains in atrial fibrillation this morning. Heart rate is elevated. There appears to be a contribution from agitation this morning. Continue Cardizem for rate control and increase as needed. If blood pressure becomes an issue could add IV amiodarone if needed although would like to avoid if possible given history of COPD and interstitial lung disease.. Continue IV heparin. Atrial fibrillation likely contributing to diastolic congestive heart failure. Would like to cardiovert after she improves.  2 chronic diastolic congestive heart failure-patient is not markedly volume overloaded on examination but CVP 12. She is presently receiving Lasix and nephrology following.  3 possible pneumonia-antibiotics per critical care medicine.  4 acute kidney disease with prior renal transplant-nephrology  following. This is felt to be secondary to contrast. Renal function worse today.  5 VDRF-per CCM  For questions or updates, please contact Post Please consult [email or web address] for contact info under Cardiology/STEMI.  Signed, Kirk Ruths, MD  01/08/2017, 8:06 AM

## 2017-01-08 NOTE — Progress Notes (Signed)
[facility] Rounding Note Subjective/Interval History:  Daughters in room with her, updated on Mom's renal function Making more urine but creatinine still rising Remains intubated Weight up another 2 kg (All told 50.6->55.2) but CXR looking better  Objective Vital signs in last 24 hours: Vitals:   01/08/17 0500 01/08/17 0600 01/08/17 0700 01/08/17 0731  BP: 114/73 110/73 113/78 (!) 117/58  Pulse: (!) 102 (!) 101 (!) 108 (!) 102  Resp: (!) 9 (!) 26 (!) 26 (!) 26  Temp:      TempSrc:      SpO2: 95% 94% 93% 92%  Weight: 55.2 kg (121 lb 11.1 oz)     Height:       Weight change: 1.8 kg (3 lb 15.5 oz)  Intake/Output Summary (Last 24 hours) at 01/08/2017 0740 Last data filed at 01/08/2017 0700 Gross per 24 hour  Intake 2625.19 ml  Output 1005 ml  Net 1620.19 ml   Physical Exam:  Blood pressure (!) 117/58, pulse (!) 102, temperature 100.1 F (37.8 C), temperature source Oral, resp. rate (!) 26, height 5' 5"  (1.651 m), weight 55.2 kg (121 lb 11.1 oz), SpO2 92 %.  Exam Gen on the vent, hands in mittens ETT, NG, foley Sclera anicteric No jvd or bruits Chest anteriorly coarse BS Cor irreg irreg AF RVR Abd soft ntnd no mass or ascites +bs Renal allograft left pelvis not tender GU foley in place, clear urine Ext no sig LE or UE edema  Neuro is sedated on the vent   Recent Labs  Lab 01/04/17 0432 01/04/17 0951 01/05/17 0245 01/05/17 1150 01/05/17 1656 01/06/17 0508 01/06/17 1557 01/07/17 0511 01/08/17 0359  NA 135 136 136  --  134* 132*  --  133* 133*  K 3.4* 4.1 3.6  --  4.1 3.8  --  3.8 4.3  CL 99* 102 103  --  102 100*  --  101 101  CO2 23 25 24   --  24 21*  --  21* 20*  GLUCOSE 183* 188* 254*  --  295* 365*  --  270* 269*  BUN 7 11 14   --  28* 41*  --  64* 90*  CREATININE 0.80 0.90 0.95  --  1.67* 2.20*  --  3.14* 3.84*  CALCIUM 8.7* 8.7* 8.4*  --  8.8* 8.4*  --  8.4* 8.4*  PHOS  --   --  3.5 2.6 2.9 3.9 4.1  --   --     Recent Labs  Lab 01/03/17 0708 01/04/17 0951  01/06/17 0508  AST 27 29 21   ALT 18 15 10*  ALKPHOS 65 59 62  BILITOT 1.1 1.5* 1.0  PROT 6.4* 6.5 6.2*  ALBUMIN 3.3* 3.0* 2.4*    Recent Labs  Lab 01/03/17 0708  01/04/17 0951 01/05/17 0245 01/06/17 0508 01/08/17 0359  WBC 9.0   < > 11.1* 14.2* 12.0* 6.4  NEUTROABS 6.4  --  9.1* 12.5*  --   --   HGB 11.3*   < > 11.5* 11.1* 9.7* 8.5*  HCT 34.7*   < > 33.8* 33.7* 29.7* 25.8*  MCV 94.3   < > 93.6 94.7 93.1 91.2  PLT 197   < > 166 257 232 152   < > = values in this interval not displayed.    Recent Labs  Lab 01/07/17 1215 01/07/17 1557 01/07/17 1920 01/08/17 0106 01/08/17 0335  GLUCAP 243* 217* 229* 289* 259*   Studies/Results: Dg Chest Port 1 View  Result Date: 01/08/2017  CLINICAL DATA:  Respiratory failure EXAM: PORTABLE CHEST 1 VIEW COMPARISON:  01/07/2017 FINDINGS: Endotracheal tube in good position. NG in the stomach. Central line in the lower SVC unchanged Interval improvement in diffuse bilateral airspace disease since yesterday. Underlying COPD and scarring. No effusion or collapse. Mild bibasilar atelectasis IMPRESSION: Significant improvement in diffuse bilateral airspace disease which may be due to improvement in edema or pneumonia. Electronically Signed   By: Franchot Gallo M.D.   On: 01/08/2017 07:12   Dg Chest Port 1 View  Result Date: 01/07/2017 CLINICAL DATA:  Respiratory failure EXAM: PORTABLE CHEST 1 VIEW COMPARISON:  01/06/2017 FINDINGS: Support devices are stable. Severe diffuse bilateral airspace disease has worsened since prior study. Cardiomegaly. Probable small left effusion. No acute bony abnormality. IMPRESSION: Worsening severe diffuse bilateral airspace disease which could reflect edema or infection. Suspect small left effusion. Electronically Signed   By: Rolm Baptise M.D.   On: 01/07/2017 08:19   Medications: . sodium chloride 10 mL/hr at 01/08/17 0700  . dexmedetomidine (PRECEDEX) IV infusion 1.199 mcg/kg/hr (01/08/17 0700)  . diltiazem  (CARDIZEM) infusion 12.5 mg/hr (01/08/17 0700)  . feeding supplement (VITAL AF 1.2 CAL) 1,000 mL (01/08/17 0700)  . fentaNYL infusion INTRAVENOUS 200 mcg/hr (01/08/17 0700)  . heparin 1,100 Units/hr (01/08/17 0700)  . piperacillin-tazobactam (ZOSYN)  IV Stopped (01/08/17 0343)   . atorvastatin  20 mg Per Tube Daily  . chlorhexidine gluconate (MEDLINE KIT)  15 mL Mouth Rinse BID  . cycloSPORINE  1 drop Both Eyes BID  . furosemide  80 mg Intravenous Q8H  . hydrocortisone sod succinate (SOLU-CORTEF) inj  50 mg Intravenous Q6H  . insulin aspart  0-20 Units Subcutaneous Q4H  . insulin glargine  45 Units Subcutaneous QHS  . ipratropium  0.5 mg Nebulization Q6H  . levalbuterol  0.63 mg Nebulization Q6H  . mouth rinse  15 mL Mouth Rinse QID  . pantoprazole sodium  40 mg Per Tube Q24H    Background: 71 y.o. year-old with hx of ESRD sp renal transplant (LRD, 1984, pred/ imuran) Presented 11/14 w/SOB/ DOE and some mild CP. Recent new AF on BB and Eliquis. Hx of dCHF, not on diuretics PTA. Hypoxic on adm. BNP was 2000, initial Rx IV diltiazem for afib /RVR, not felt to be vol overloaded.  CT angio w/ contrast no PE but RUL air space disease, pulm edema vs infection.  Lactic acid 1.45, WBC 9k. Admitted, rx'd with IV diltiazem, po metoprolol, IV abx (rocephin, vanc/ zosyn) for PNA. Intubated 11/15. Admit creat 0.8, 2.2 on 11/16, 3.14 11/18 w/oliguria. Weight up 50->53 kg. CXR diffuse bilat infiltrates, R> L . Hypotension on levo gtt for about 24 hrs.  BP's in low 100 range.  ECHO showed normal LVEF.  Asked to see for AKI.    Impression: 1. AKI - likely due to IV contrast nephropathy, compounded by relative hypotension and effects possible sepsis as well as AF w/RVR.  Vanc levels not high (now stopped). Creatinine still rising but making urine. 2. Resp failure - multifocal PNA / hx pulm fibrosis and COPD - on vent 3. Volume - difficult assessment. Current CVP 12. Elev BNP.   Agree w/diuretics -  non-oliguric now on furosemide   4. Pulm fibrosis/ COPD - longstanding, f/b pulm 5. AF with RVR in IV dilt. 6. Renal transplant - doubt rejection on the front end, creat was normal pre CT angio. Home meds imuran (has not received since intubation) and prednisone (getting solucortef). Imuran intentinally on hold because  of possible sepsis, would give via NG.  Jamal Maes, MD [facility] [phone] pager 01/08/2017, 7:40 AM

## 2017-01-08 NOTE — Progress Notes (Signed)
ANTICOAGULATION & ANTIMICROBIAL CONSULT NOTE  Pharmacy Consult for heparin; Zosyn Indication: atrial fibrillation; pneumonia  Patient Measurements: Height: 5\' 5"  (165.1 cm) Weight: 121 lb 11.1 oz (55.2 kg) IBW/kg (Calculated) : 57 Heparin Dosing Weight: 51.9kg  Vital Signs: Temp: 98.5 F (36.9 C) (11/19 1558) Temp Source: Oral (11/19 1558) BP: 122/72 (11/19 1700) Pulse Rate: 108 (11/19 1700)  Labs: Recent Labs    01/06/17 0508  01/06/17 2000 01/07/17 0511 01/07/17 1156 01/08/17 0359 01/08/17 1640  HGB 9.7*  --   --   --   --  8.5* 8.7*  HCT 29.7*  --   --   --   --  25.8* 26.2*  PLT 232  --   --   --   --  152  --   APTT  --    < > 58* 68* 75* 83*  --   HEPARINUNFRC  --   --  >2.20* >2.20*  --  >2.20*  --   CREATININE 2.20*  --   --  3.14*  --  3.84*  --    < > = values in this interval not displayed.    Estimated Creatinine Clearance: 11.5 mL/min (A) (by C-G formula based on SCr of 3.84 mg/dL (H)).  Medications:  Infusions:  . sodium chloride 10 mL/hr at 01/08/17 0800  . dexmedetomidine (PRECEDEX) IV infusion 1.2 mcg/kg/hr (01/08/17 1700)  . diltiazem (CARDIZEM) infusion 15 mg/hr (01/08/17 1700)  . feeding supplement (VITAL AF 1.2 CAL) 1,000 mL (01/08/17 0800)  . fentaNYL infusion INTRAVENOUS 200 mcg/hr (01/08/17 1700)  . heparin 1,100 Units/hr (01/08/17 1700)  . piperacillin-tazobactam (ZOSYN)  IV Stopped (01/08/17 1059)    Assessment: 47 yof presented with dyspnea and near syncope. She was on apixaban but now transitioned to IV heparin and on Zosyn for pneumonia.   Anticoagulation: APTT remains therapeutic at 83. HL still elevated due to recent apixaban. Using aPTT for now. Hgb down this AM at 8.5,. Platelets down at 152. No overt bleeding noted. CT Abd shows 5.4 x 2.8 cm fluid collection in pancreatectomy bed but unable to give contrast to better visualize. Discussed with Dr. Ashok Cordia and plan is to continue heparin for now and repeat H/H this PM. SCr still  rising but making urine.   Infectious disease: On day # 4 out of 7 of Zosyn for pneumonia. Tmax 100.1. WBC is within normal limits. PCT 0.52 on last check. LA within normal limits. SCr continues to rise at 3.84 with BUN 90. Patient has history of renal transplant in 1983 and was on azathioprine prior to admission but this is currently on hold in setting of infection. Patient is making good urine at 0.8 mL/kg/hr.   Goal of Therapy:  Heparin level 0.3-0.7 units/ml aPTT 66-102 seconds Monitor platelets by anticoagulation protocol: Yes   Plan:  Continue heparin gtt 1100 units/hr Daily aPTT, heparin level and CBC  Continue Zosyn 2.25g IV every 8 hours.  Monitor renal plans, clinical status, and culture results.  Stop date added for 01/11/17.   Sloan Leiter, PharmD, BCPS, BCCCP Clinical Pharmacist Clinical phone 01/08/2017 until 3:30PM [phone] After hours, please call [phone] 01/08/2017 5:13 PM   PM Addendum H/H stable from this am 8.7/ 26.2 - no change  Bonnita Nasuti Pharm.D. CPP, BCPS Clinical Pharmacist [phone] 01/08/2017 5:14 PM

## 2017-01-08 NOTE — Progress Notes (Signed)
Inpatient Diabetes Program Recommendations  AACE/ADA: New Consensus Statement on Inpatient Glycemic Control (2015)  Target Ranges:  Prepandial:   less than 140 mg/dL      Peak postprandial:   less than 180 mg/dL (1-2 hours)      Critically ill patients:  140 - 180 mg/dL   Results for TOMECA, HELM (MRN [id number]) as of 01/08/2017 13:54  Ref. Range 01/07/2017 08:57 01/07/2017 12:15 01/07/2017 15:57 01/07/2017 19:20 01/08/2017 01:06 01/08/2017 03:35 01/08/2017 08:30 01/08/2017 12:08  Glucose-Capillary Latest Ref Range: 65 - 99 mg/dL 249 (H) 243 (H) 217 (H) 229 (H) 289 (H) 259 (H) 251 (H) 198 (H)   Review of Glycemic Control Diabetes history: No Outpatient Diabetes medications: NA Current orders for Inpatient glycemic control: Lantus 45 units QHS, Novolog 0-20 units Q4H  Inpatient Diabetes Program Recommendations: Insulin-Basal: Noted patient received Lantus 30 units on 01/07/17 and Lantus has been increased to 45 units QHS.  Inpatient Glycemic control: Over the past 24 hours, glucose has ranged from 198-289 mg/dl and patient has received a total of Novolog 58 units for correction.  Please consider using ICU Glycemic Control order set to improve inpatient glycemic control. Likely need to use Phase 2 IV insulin.  Thanks, Barnie Alderman, RN, MSN, CDE Diabetes Coordinator Inpatient Diabetes Program [phone] (Team Pager from 8am to 5pm)

## 2017-01-08 NOTE — Progress Notes (Signed)
PULMONARY / CRITICAL CARE MEDICINE   Name: Morgan Cooper MRN: [id number] DOB: 02-02-1946    ADMISSION DATE:  01/03/2017 CONSULTATION DATE:  01/04/17  REFERRING MD:  TRH  CHIEF COMPLAINT:  SOB  HISTORY OF PRESENT ILLNESS:   71 yo female former smoker presented with dyspnea and hypoxia.  She had A fib with RVR and concern for HCAP with progression pulmonary infiltrates.  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  SUBJECTIVE:  Unable to provide 2/2 ETT.   REVIEW OF SYSTEMS:  Cannot provide history 2/2 sedation.   VITAL SIGNS: BP 113/78   Pulse (!) 108   Temp 100.1 F (37.8 C) (Oral)   Resp (!) 26   Ht 5\' 5"  (1.651 m)   Wt 121 lb 11.1 oz (55.2 kg)   SpO2 93%   BMI 20.25 kg/m   HEMODYNAMICS: CVP:  [12 mmHg-15 mmHg] 12 mmHg  VENTILATOR SETTINGS: Vent Mode: PCV FiO2 (%):  [60 %-90 %] 60 % Set Rate:  [18 bmp-26 bmp] 26 bmp Vt Set:  [460 mL] 460 mL PEEP:  [10 cmH20-14 cmH20] 14 cmH20 Pressure Support:  [14 cmH20] 14 cmH20 Plateau Pressure:  [19 cmH20-32 cmH20] 32 cmH20  INTAKE / OUTPUT: I/O last 3 completed shifts: In: 3628 [I.V.:1808; NG/GT:1620; IV Piggyback:200] Out: 1178 [Urine:1178]  PHYSICAL EXAMINATION:  General - elderly female lying in bed sedated on ventilator Eyes - pupils reactive ENT - ETT in place Cardiac - irregular, no murmur Chest - b/l rales Abd - soft, non tender Ext -trace edema Skin - no rashes Neuro - RASS -2  LABS:  BMET Recent Labs  Lab 01/06/17 0508 01/07/17 0511 01/08/17 0359  NA 132* 133* 133*  K 3.8 3.8 4.3  CL 100* 101 101  CO2 21* 21* 20*  BUN 41* 64* 90*  CREATININE 2.20* 3.14* 3.84*  GLUCOSE 365* 270* 269*    Electrolytes Recent Labs  Lab 01/05/17 1656 01/06/17 0508 01/06/17 1557 01/07/17 0511 01/08/17 0359   CALCIUM 8.8* 8.4*  --  8.4* 8.4*  MG 2.9* 2.7* 2.8*  --   --   PHOS 2.9 3.9 4.1  --   --     CBC Recent Labs  Lab 01/05/17 0245 01/06/17 0508 01/08/17 0359  WBC 14.2* 12.0* 6.4  HGB 11.1* 9.7* 8.5*  HCT 33.7* 29.7* 25.8*  PLT 257 232 152    Coag's Recent Labs  Lab 01/03/17 0708  01/07/17 0511 01/07/17 1156 01/08/17 0359  APTT  --    < > 68* 75* 83*  INR 1.59  --   --   --   --    < > = values in this interval not displayed.    Sepsis Markers Recent Labs  Lab 01/03/17 1004 01/03/17 1158 01/05/17 0245 01/05/17 1656 01/06/17 0508 01/07/17 0511  LATICACIDVEN 1.45 1.70  --  1.9  --   --   PROCALCITON  --   --  0.52  --  1.59 1.49    ABG Recent Labs  Lab 01/07/17 0446 01/07/17 1043 01/08/17 0307  PHART 7.267* 7.287* 7.354  PCO2ART 43.4 42.6 33.9  PO2ART 56.0* 123* 179*    Liver Enzymes Recent Labs  Lab 01/03/17 0708 01/04/17 0951 01/06/17 0508  AST 27 29 21   ALT 18 15 10*  ALKPHOS 65 59 62  BILITOT 1.1 1.5* 1.0  ALBUMIN  3.3* 3.0* 2.4*    Cardiac Enzymes No results for input(s): TROPONINI, PROBNP in the last 168 hours.  Glucose Recent Labs  Lab 01/07/17 0857 01/07/17 1215 01/07/17 1557 01/07/17 1920 01/08/17 0106 01/08/17 0335  GLUCAP 249* 243* 217* 229* 289* 259*    Imaging Dg Chest Port 1 View  Result Date: 01/08/2017 CLINICAL DATA:  Respiratory failure EXAM: PORTABLE CHEST 1 VIEW COMPARISON:  01/07/2017 FINDINGS: Endotracheal tube in good position. NG in the stomach. Central line in the lower SVC unchanged Interval improvement in diffuse bilateral airspace disease since yesterday. Underlying COPD and scarring. No effusion or collapse. Mild bibasilar atelectasis IMPRESSION: Significant improvement in diffuse bilateral airspace disease which may be due to improvement in edema or pneumonia. Electronically Signed   By: Franchot Gallo M.D.   On: 01/08/2017 07:12     STUDIES:  CTA PE 11/14 >> no PE, diffuse RUL airspace disease  superimposed on emphysema and ILD Echo 11/15 >> EF 55 to 60%, PAS 39 mmHg  CULTURES: Legionella Ag 11/14 >> negative Pneumococcal Ag 11/14 >> negative Influenza PCR 11/14 >> negative Blood 11/14 >>  Respiratory viral panel 11/15 >> negative Sputum 11/16 >> PCP 11/16 >>   ANTIBIOTICS: Zithromax 11/14 >> 11/25 Rocephin 11/14 >> 11/15 Vancomycin 11/14 >> 11/18 Zosyn 11/15 >>   SIGNIFICANT EVENTS: 11/14 Admit 11/16 to ICU 11/18 Renal consulted  LINES/TUBES: ETT 11/16 >> Rt IJ CVL 11/16 >> Lt radial aline 11/16 >>   DISCUSSION: 71 yo female with VDRF from HCAP, pulmonary edema in setting of A fib with RVR and acute on chronic diastolic CHF.  Has hx of COPD, ILD, and renal transplant.  ASSESSMENT / PLAN:  Acute on chronic hypoxic respiratory failure. CXR from 11/18 improved after diuresis (80mg  q8 hours). changed to pressure control 11/18. - HCAP, pulmonary edema, COPD, and ILD - day 6/7 Abx >> d/c vancomycin 11/18 - scheduled BDs  A fib with RVR. Acute on chronic diastolic CHF. - changed to heparin gtt in place of eliquis with worsening renal fx - diltiazem gtt per cardiology - continue lipitor  Hx of renal transplant. Acute renal failure with ATN. - consulted nephrology 11/18 - continue solu cortef - hold imuran in setting of infectious process - s/p lasix, hold additional diuresis today  Severe protein calorie malnutrition. - continue tube feeds  Steroid induced hyperglycemia. - resistant scale for SSI - continue lantus 45 units daily  Anemia of critical illness and chronic disease. CT abd without contrast to evaluate possible abdominal pathology.  - pm CBC and type and screen - low threshold to d/c heparin if continued worsening anemia  Acute metabolic encephalopathy. - RASS goal 0 to -1  DVT prophylaxis - heparin gtt SUP - protonix Nutrition - tube feeds Goals of care - full code  Ralene Ok, MD PGY2 FM Resident 7:20 AM

## 2017-01-09 LAB — CULTURE, BLOOD (ROUTINE X 2)
Culture: NO GROWTH
Special Requests: ADEQUATE

## 2017-01-09 LAB — CBC WITH DIFFERENTIAL/PLATELET
Basophils Absolute: 0 10*3/uL (ref 0.0–0.1)
Basophils Relative: 0 %
EOS ABS: 0 10*3/uL (ref 0.0–0.7)
EOS PCT: 0 %
HCT: 23.6 % — ABNORMAL LOW (ref 36.0–46.0)
Hemoglobin: 8 g/dL — ABNORMAL LOW (ref 12.0–15.0)
LYMPHS ABS: 0.5 10*3/uL — AB (ref 0.7–4.0)
Lymphocytes Relative: 8 %
MCH: 30.8 pg (ref 26.0–34.0)
MCHC: 33.9 g/dL (ref 30.0–36.0)
MCV: 90.8 fL (ref 78.0–100.0)
MONOS PCT: 5 %
Monocytes Absolute: 0.3 10*3/uL (ref 0.1–1.0)
Neutro Abs: 5.4 10*3/uL (ref 1.7–7.7)
Neutrophils Relative %: 87 %
PLATELETS: 150 10*3/uL (ref 150–400)
RBC: 2.6 MIL/uL — AB (ref 3.87–5.11)
RDW: 15.1 % (ref 11.5–15.5)
WBC: 6.3 10*3/uL (ref 4.0–10.5)

## 2017-01-09 LAB — OCCULT BLOOD X 1 CARD TO LAB, STOOL: Fecal Occult Bld: POSITIVE — AB

## 2017-01-09 LAB — RENAL FUNCTION PANEL
ANION GAP: 16 — AB (ref 5–15)
Albumin: 2 g/dL — ABNORMAL LOW (ref 3.5–5.0)
BUN: 124 mg/dL — ABNORMAL HIGH (ref 6–20)
CALCIUM: 8.6 mg/dL — AB (ref 8.9–10.3)
CO2: 18 mmol/L — ABNORMAL LOW (ref 22–32)
Chloride: 101 mmol/L (ref 101–111)
Creatinine, Ser: 4.29 mg/dL — ABNORMAL HIGH (ref 0.44–1.00)
GFR calc Af Amer: 11 mL/min — ABNORMAL LOW (ref >60)
GFR calc non Af Amer: 9 mL/min — ABNORMAL LOW (ref >60)
GLUCOSE: 278 mg/dL — AB (ref 65–99)
PHOSPHORUS: 5.6 mg/dL — AB (ref 2.5–4.6)
Potassium: 3.5 mmol/L (ref 3.5–5.1)
SODIUM: 135 mmol/L (ref 135–145)

## 2017-01-09 LAB — GLUCOSE, CAPILLARY
GLUCOSE-CAPILLARY: 183 mg/dL — AB (ref 65–99)
GLUCOSE-CAPILLARY: 257 mg/dL — AB (ref 65–99)
GLUCOSE-CAPILLARY: 263 mg/dL — AB (ref 65–99)
Glucose-Capillary: 277 mg/dL — ABNORMAL HIGH (ref 65–99)
Glucose-Capillary: 251 mg/dL — ABNORMAL HIGH (ref 65–99)
Glucose-Capillary: 163 mg/dL — ABNORMAL HIGH (ref 65–99)

## 2017-01-09 LAB — APTT: aPTT: 130 seconds — ABNORMAL HIGH (ref 24–36)

## 2017-01-09 LAB — HEPARIN LEVEL (UNFRACTIONATED): Heparin Unfractionated: >2.2 IU/mL — ABNORMAL HIGH (ref 0.30–0.70)

## 2017-01-09 LAB — MAGNESIUM: Magnesium: 2.8 mg/dL — ABNORMAL HIGH (ref 1.7–2.4)

## 2017-01-09 MED ORDER — SODIUM BICARBONATE 650 MG PO TABS
1300.0000 mg | ORAL_TABLET | Freq: Three times a day (TID) | ORAL | Status: DC
  Administered 2017-01-09 – 2017-01-11 (×9): 1300 mg
  Filled 2017-01-09 (×11): qty 2

## 2017-01-09 MED ORDER — DEXMEDETOMIDINE HCL IN NACL 400 MCG/100ML IV SOLN
0.4000 ug/kg/h | INTRAVENOUS | Status: DC
  Administered 2017-01-09 (×2): 1.2 ug/kg/h via INTRAVENOUS
  Administered 2017-01-09: 1 ug/kg/h via INTRAVENOUS
  Administered 2017-01-10: 1.2 ug/kg/h via INTRAVENOUS
  Administered 2017-01-10: 1.1 ug/kg/h via INTRAVENOUS
  Administered 2017-01-10: 1.2 ug/kg/h via INTRAVENOUS
  Administered 2017-01-10: 1 ug/kg/h via INTRAVENOUS
  Administered 2017-01-11 – 2017-01-13 (×10): 1.2 ug/kg/h via INTRAVENOUS
  Administered 2017-01-13: 0.6 ug/kg/h via INTRAVENOUS
  Administered 2017-01-14: 0.4 ug/kg/h via INTRAVENOUS
  Filled 2017-01-09 (×17): qty 100

## 2017-01-09 MED ORDER — VITAL AF 1.2 CAL PO LIQD
1000.0000 mL | ORAL | Status: AC
  Administered 2017-01-09 – 2017-01-15 (×9): 1000 mL
  Filled 2017-01-09 (×3): qty 1000

## 2017-01-09 MED ORDER — INSULIN GLARGINE 100 UNIT/ML ~~LOC~~ SOLN
50.0000 [IU] | Freq: Every day | SUBCUTANEOUS | Status: DC
  Administered 2017-01-09 – 2017-01-11 (×3): 50 [IU] via SUBCUTANEOUS
  Filled 2017-01-09 (×3): qty 0.5

## 2017-01-09 MED ORDER — METOPROLOL TARTRATE 5 MG/5ML IV SOLN
2.5000 mg | Freq: Four times a day (QID) | INTRAVENOUS | Status: DC | PRN
  Administered 2017-01-09 – 2017-01-10 (×4): 2.5 mg via INTRAVENOUS
  Filled 2017-01-09 (×4): qty 5

## 2017-01-09 NOTE — Progress Notes (Signed)
Called to patient's room to evaluate aline.  Site actively bleeding.  Unable to stop the bleeding while catheter in place.  Orders received to d/c aline.  Pressure held until bleeding stopped.  Pressure dressing placed.

## 2017-01-09 NOTE — Progress Notes (Signed)
PCCM Attending Re-Rounding Note:  Still requiring heavy sedation on Precedex and Fentanyl. Not following commands. Didn't tolerate switching from PC to [facility]. Previous QTc 513ms on 11/17. Checking EKG to see if Haldol or Seroquel may be options. Continuing PC ventilation.  Sonia Baller Ashok Cordia, M.D. [facility] Pager:  [phone] After 7pm or if no response, call [phone] 6:07 PM 01/09/17

## 2017-01-09 NOTE — Progress Notes (Addendum)
Nutrition Follow-up  DOCUMENTATION CODES:   Severe malnutrition in context of chronic illness  INTERVENTION:   Change TF goal rate to better meet re-estimated needs:  Vital AF 1.2 at 55 ml/h (1320 ml per day)  Provides 1584 kcal, 99 gm protein, 1071 ml free water daily  NUTRITION DIAGNOSIS:   Severe Malnutrition related to chronic illness(CHF) as evidenced by severe muscle depletion, severe fat depletion.  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  Patient remains intubated on ventilator support MV: 13.9 L/min Temp (24hrs), Avg:99.1 F (37.3 C), Min:98.1 F (36.7 C), Max:100.2 F (37.9 C)   Patient is currently receiving Vital AF 1.2 via OGT at 45 ml/h (1080 ml/day) to provide 1296 kcals, 81 gm protein, 876 ml free water daily. Tolerating without difficulty.  Labs reviewed. Phosphorus 5.6 (H), magnesium 2.8 (H) CBG's: [phone] Medications reviewed and include Colace.  NUTRITION - FOCUSED PHYSICAL EXAM:    Most Recent Value  Orbital Region  Unable to assess  Upper Arm Region  Severe depletion  Thoracic and Lumbar Region  Severe depletion  Buccal Region  Unable to assess  Temple Region  Mild depletion  Clavicle Bone Region  Moderate depletion  Clavicle and Acromion Bone Region  Moderate depletion  Scapular Bone Region  Unable to assess  Dorsal Hand  Unable to assess  Patellar Region  Severe depletion  Anterior Thigh Region  Severe depletion  Posterior Calf Region  Severe depletion  Edema (RD Assessment)  None  Hair  Reviewed  Eyes  Unable to assess  Mouth  Unable to assess  Skin  Reviewed  Nails  Reviewed       Diet  Order:  Diet NPO time specified  EDUCATION NEEDS:   No education needs have been identified at this time  Skin:  Skin Integrity Issues:: Stage I Stage I: sacrum  Last BM:  11/20 (heme +)  Height:   Ht Readings from Last 1 Encounters:  01/03/17 5' 5"  (1.651 m)    Weight:   Wt Readings from Last 1 Encounters:  01/09/17 121 lb 14.6 oz (55.3 kg)    Ideal Body Weight:  56.8 kg  BMI:  Body mass index is 20.29 kg/m.  Estimated Nutritional Needs:   Kcal:  1525  Protein:  75-90 gm  Fluid:  >/= 1.5 L   Molli Barrows, RD, LDN, [place] Pager [phone] After Hours Pager [phone]

## 2017-01-09 NOTE — Procedures (Signed)
A-line and dressing changed without complications.  

## 2017-01-09 NOTE — Progress Notes (Addendum)
Patient's stool came back hemoccult +.Richardson Landry Minor NP informed. Heparin stopped per orders

## 2017-01-09 NOTE — Progress Notes (Signed)
ANTICOAGULATION CONSULT NOTE - Follow Up Consult  Pharmacy Consult for heparin Indication: atrial fibrillation  Labs: Recent Labs    01/07/17 0511 01/07/17 1156  01/08/17 0359 01/08/17 1640 01/09/17 0455  HGB  --   --    < > 8.5* 8.7* 8.0*  HCT  --   --   --  25.8* 26.2* 23.6*  PLT  --   --   --  152  --  150  APTT 68* 75*  --  83*  --  130*  HEPARINUNFRC >2.20*  --   --  >2.20*  --  >2.20*  CREATININE 3.14*  --   --  3.84*  --  4.29*   < > = values in this interval not displayed.    Assessment: 71yo female now above goal on heparin; Hgb cont's to trend down, no over signs of bleeding per RN though internal bleeding has not been ruled out.  Goal of Therapy:  aPTT 66-102 seconds   Plan:  Will decrease heparin gtt by ~15% to 950 units/hr and check PTT in [place], PharmD, BCPS  01/09/2017,6:17 AM

## 2017-01-09 NOTE — Progress Notes (Signed)
PULMONARY / CRITICAL CARE MEDICINE   Name: Morgan Cooper MRN: [id number] DOB: 1945/10/23    ADMISSION DATE:  01/03/2017 CONSULTATION DATE:  01/04/17  REFERRING MD:  TRH  CHIEF COMPLAINT:  SOB  HISTORY OF PRESENT ILLNESS:   71 yo female former smoker presented with dyspnea and hypoxia.  She had A fib with RVR and concern for HCAP with progression pulmonary infiltrates.  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  SUBJECTIVE:  Unable to provide 2/2 ETT.   REVIEW OF SYSTEMS:  Cannot provide history 2/2 sedation.   VITAL SIGNS: BP (!) 141/78   Pulse (!) 120   Temp 99.1 F (37.3 C) (Oral)   Resp (!) 27   Ht 5\' 5"  (1.651 m)   Wt 121 lb 14.6 oz (55.3 kg)   SpO2 95%   BMI 20.29 kg/m   HEMODYNAMICS: CVP:  [8 mmHg-10 mmHg] 10 mmHg  VENTILATOR SETTINGS: Vent Mode: PCV FiO2 (%):  [40 %] 40 % Set Rate:  [26 bmp] 26 bmp Vt Set:  [460 mL] 460 mL PEEP:  [14 cmH20] 14 cmH20 Plateau Pressure:  [26 cmH20-31 cmH20] 31 cmH20  INTAKE / OUTPUT: I/O last 3 completed shifts: In: 4199.1 [I.V.:2424.1; NG/GT:1575; IV DXAJOINOM:767] Out: 2110 [Urine:2110]  PHYSICAL EXAMINATION:  General: Frail female on ventilator HEENT: Endotracheal tube to ventilator.  Tube feeds to goal PSY: Sedated Neuro: Sedated on ventilator CV: Heart sounds are regular, cardiac monitor displays atrial fibrillation with a heart rate of 96 currently on Cardizem drip PULM: Pressure control ventilation 18/14 with tidal volume of 572 cc Percent FiO2 with 14 of PEEP generates a O2 saturation of 100% MC:NOBS, non-tender, bsx4 active tube feedings at goal  extremities: warm/dry, 1 edema  Skin: no rashes or lesions   LABS:  BMET Recent Labs  Lab 01/07/17 0511 01/08/17 0359 01/09/17 0455  NA 133* 133* 135  K 3.8  4.3 3.5  CL 101 101 101  CO2 21* 20* 18*  BUN 64* 90* 124*  CREATININE 3.14* 3.84* 4.29*  GLUCOSE 270* 269* 278*    Electrolytes Recent Labs  Lab 01/06/17 0508 01/06/17 1557 01/07/17 0511 01/08/17 0359 01/09/17 0455  CALCIUM 8.4*  --  8.4* 8.4* 8.6*  MG 2.7* 2.8*  --   --  2.8*  PHOS 3.9 4.1  --   --  5.6*    CBC Recent Labs  Lab 01/06/17 0508 01/08/17 0359 01/08/17 1640 01/09/17 0455  WBC 12.0* 6.4  --  6.3  HGB 9.7* 8.5* 8.7* 8.0*  HCT 29.7* 25.8* 26.2* 23.6*  PLT 232 152  --  150    Coag's Recent Labs  Lab 01/03/17 0708  01/07/17 1156 01/08/17 0359 01/09/17 0455  APTT  --    < > 75* 83* 130*  INR 1.59  --   --   --   --    < > = values in this interval not displayed.    Sepsis Markers Recent Labs  Lab 01/03/17 1004 01/03/17 1158 01/05/17 0245 01/05/17 1656 01/06/17 0508 01/07/17 0511  LATICACIDVEN 1.45 1.70  --  1.9  --   --   PROCALCITON  --   --  0.52  --  1.59 1.49    ABG Recent Labs  Lab 01/07/17 0446 01/07/17 1043 01/08/17 0307  PHART 7.267* 7.287* 7.354  PCO2ART 43.4 42.6 33.9  PO2ART 56.0* 123* 179*    Liver Enzymes Recent Labs  Lab 01/03/17 0708 01/04/17 0951 01/06/17 0508 01/09/17 0455  AST 27 29 21   --   ALT 18 15 10*  --   ALKPHOS 65 59 62  --   BILITOT 1.1 1.5* 1.0  --   ALBUMIN 3.3* 3.0* 2.4* 2.0*    Cardiac Enzymes No results for input(s): TROPONINI, PROBNP in the last 168 hours.  Glucose Recent Labs  Lab 01/08/17 1208 01/08/17 1542 01/08/17 2017 01/09/17 0020 01/09/17 0450 01/09/17 0842  GLUCAP 198* 160* 210* 257* 277* 263*    Imaging Ct Abdomen Pelvis Wo Contrast  Result Date: 01/08/2017 CLINICAL DATA:  Abdominal distention. EXAM: CT ABDOMEN AND PELVIS WITHOUT CONTRAST TECHNIQUE: Multidetector CT imaging of the abdomen and pelvis was performed following the standard protocol without IV contrast. COMPARISON:  05/23/2016 FINDINGS: Motion degraded study. Lower chest: The heart is enlarged. Coronary  artery calcification is evident. Tiny right pleural effusion. Emphysema with bilateral ground-glass attenuation. 2.1 cm nodular opacity identified left lung base on image 7 series 4. Hepatobiliary: No focal abnormality in the liver on this study without intravenous contrast. gallbladder is distended with layering sludge or tiny stones. There is probably some pericholecystic fluid although this appearance could be artifactual from the motion. No intrahepatic or extrahepatic biliary dilation. Pancreas: Patient is status post distal pancreatectomy. Left upper quadrant surgical drain seen in the region of the pancreatectomy bed on the prior study is no longer evident. 5.4 x 2.8 cm apparent fluid collection identified in the pancreatectomy bed appears new in the interval but is not well evaluated given the lack of intravenous contrast and motion artifact. Spleen: No splenomegaly. Adrenals/Urinary Tract: No adrenal nodule or mass. Native kidneys are dramatically atrophic. 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 Similar appearance probable 16 mm cyst in the interpolar region. Mild fullness intrarenal collecting system without overt hydroureteronephrosis. Bladder is decompressed by a Foley catheter and gas within the bladder lumen is compatible with the instrumentation. Stomach/Bowel: NG tube decompresses the stomach. Duodenum is normally positioned as is the ligament of Treitz. No evidence for small bowel obstruction. Terminal ileum unremarkable. Right colon is fluid-filled. Sigmoid colonic anastomosis is similar to prior, being distended and filled with stool. Vascular/Lymphatic: 4.1 cm abdominal aortic aneurysm measured on the prior study is 4.3 cm at the same level today. There is no gastrohepatic or hepatoduodenal ligament lymphadenopathy. No intraperitoneal or retroperitoneal lymphadenopathy. No pelvic sidewall lymphadenopathy. Reproductive: Uterus  unremarkable.  There is no adnexal mass. Other: small volume free fluid identified in the peritoneal cavity. Musculoskeletal: Degenerative changes noted in the hips, left greater than right. Bones are diffusely demineralized. Diffuse body wall edema evident. IMPRESSION: 1. Study markedly degraded by patient motion and limited by lack of intravenous contrast material. 2. Gallbladder is distended with layering sludge or tiny stones. There appears to be pericholecystic fluid although assessment is hindered by the motion artifact. Abdominal ultrasound may prove helpful to further evaluate. 3. 5 x 3 cm apparent fluid collection in the distal pancreatectomy bed at the site of the previous drainage catheter. This is not well evaluated given the above limitations, but seroma, hematoma, or abscess could have this appearance. 4. Lung bases appear better aerated than CT scan of 01/03/2017 although there is persistent bilateral lower lung ground-glass attenuation potentially rated to pulmonary edema or diffuse infection. 5. New focal opacity measuring 2 cm in the left base. Given that  this was not present on the study from 5 days ago, it probably represents atelectasis or infection. 6. Similar appearance 4.3 cm abdominal aortic aneurysm. 7. No overt hydronephrosis and left pelvic transplant kidney. Gas in the bladder lumen presumably related to the presence of a Foley catheter. 8. Small volume intraperitoneal free fluid with diffuse body wall edema. Electronically Signed   By: Misty Stanley M.D.   On: 01/08/2017 12:12     STUDIES:  CTA PE 11/14 >> no PE, diffuse RUL airspace disease superimposed on emphysema and ILD Echo 11/15 >> EF 55 to 60%, PAS 39 mmHg  CULTURES: Legionella Ag 11/14 >> negative Pneumococcal Ag 11/14 >> negative Influenza PCR 11/14 >> negative Blood 11/14 >> neg Respiratory viral panel 11/15 >> negative Sputum 11/16 >>nos PCP 11/16 >> neg  ANTIBIOTICS: Zithromax 11/14 >> 11/25 Rocephin  11/14 >> 11/15 Vancomycin 11/14 >> 11/18 Zosyn 11/15 >>   SIGNIFICANT EVENTS: 11/14 Admit 11/16 to ICU 11/18 Renal consulted 11/20 heparin dc'd dueto bleeding  LINES/TUBES: ETT 11/16 >> Rt IJ CVL 11/16 >> Lt radial aline 11/16 >> 11/20  DISCUSSION: 71 yo female with VDRF from HCAP, pulmonary edema in setting of A fib with RVR and acute on chronic diastolic CHF.  Has hx of COPD, ILD, and renal transplant.  01/09/2017 has developed bleeding from invasive lines.  Hemoglobin is dropping.  Will DC heparin drip and continue to monitor CBC.  We have guaiac stools for completeness.  ASSESSMENT / PLAN:  Acute on chronic hypoxic respiratory failure. CXR from 11/18 improved after diuresis (80mg  q8 hours). changed to pressure control 11/18. - HCAP, pulmonary edema, COPD, and ILD - day 7/7 Abx >> d/c vancomycin 11/18 - scheduled BDs  A fib with RVR. Acute on chronic diastolic CHF. -Discontinued heparin drip due to bleeding issues on 01/09/2017 - diltiazem gtt / BBper cardiology - continue lipitor  Hx of renal transplant. Lab Results  Component Value Date   CREATININE 4.29 (H) 01/09/2017   CREATININE 3.84 (H) 01/08/2017   CREATININE 3.14 (H) 01/07/2017   CREATININE 0.85 01/19/2015   Recent Labs  Lab 01/07/17 0511 01/08/17 0359 01/09/17 0455  K 3.8 4.3 3.5   Recent Labs  Lab 01/07/17 0511 01/08/17 0359 01/09/17 0455  NA 133* 133* 135    Acute renal failure with ATN. - consulted nephrology 11/18 - continue solu cortef consider wean 11/20 - hold imuran in setting of infectious process - s/p lasix, hold additional diuresis today  Severe protein calorie malnutrition. - continue tube feeds  Steroid induced hyperglycemia. CBG (last 3)  Recent Labs    01/09/17 0020 01/09/17 0450 01/09/17 0842  GLUCAP 257* 277* 263*    - resistant scale for SSI increase lantus to 50 units daily 11/20  Anemia of critical illness and chronic disease along with bleeding issues on  while on heparin drip. CT abd without contrast to evaluate possible abdominal pathology.  Recent Labs    01/08/17 1640 01/09/17 0455  HGB 8.7* 8.0*    - pm CBC and type and screen -01/09/2017 we will DC heparin drip due to bleeding issues.  Arterial line discontinued due to bleeding issues.  Acute metabolic encephalopathy. - RASS goal 0 to -1  DVT prophylaxis - heparin gtt SUP - protonix Nutrition - tube feeds Goals of care - full code  Richardson Landry Solymar Grace ACNP Maryanna Shape PCCM Pager [phone] till 1 pm If no answer page [phone] 01/09/2017, 9:10 AM

## 2017-01-09 NOTE — Progress Notes (Addendum)
[facility] Rounding Note Subjective/Interval History:   Remains in atrial fib Creatinine still rising but rate of rise may be slowing UOP about 1270 yesterday Weight stable past 24 hours No lasix right now  Objective Vital signs in last 24 hours: Vitals:   01/09/17 0700 01/09/17 0800 01/09/17 0850 01/09/17 0900  BP: 106/74 104/71  (!) 141/78  Pulse: (!) 107 100  (!) 120  Resp: (!) 26 (!) 26  (!) 27  Temp:   99.1 F (37.3 C)   TempSrc:   Oral   SpO2: 100% 100%  95%  Weight:      Height:       Weight change: 0.1 kg (3.5 oz)  Intake/Output Summary (Last 24 hours) at 01/09/2017 0951 Last data filed at 01/09/2017 0900 Gross per 24 hour  Intake 2722.37 ml  Output 1270 ml  Net 1452.37 ml   Physical Exam:  Blood pressure (!) 141/78, pulse (!) 120, temperature 99.1 F (37.3 C), temperature source Oral, resp. rate (!) 27, height 5' 5"  (1.651 m), weight 55.3 kg (121 lb 14.6 oz), SpO2 95 %.  Physical Examination Intubated, hands in mittens ETT, NG, foley CVP 10 Sclera anicteric No jvd or bruits Chest anteriorly coarse BS Cor irreg irreg AF RVR Abd soft ntnd no mass or ascites +bs Renal allograft left pelvis not tender GU foley in place, clear urine Ext no sig LE or UE edema  Neuro is sedated on the vent   Recent Labs  Lab 01/04/17 0951 01/05/17 0245 01/05/17 1150 01/05/17 1656 01/06/17 0508 01/06/17 1557 01/07/17 0511 01/08/17 0359 01/09/17 0455  NA 136 136  --  134* 132*  --  133* 133* 135  K 4.1 3.6  --  4.1 3.8  --  3.8 4.3 3.5  CL 102 103  --  102 100*  --  101 101 101  CO2 25 24  --  24 21*  --  21* 20* 18*  GLUCOSE 188* 254*  --  295* 365*  --  270* 269* 278*  BUN 11 14  --  28* 41*  --  64* 90* 124*  CREATININE 0.90 0.95  --  1.67* 2.20*  --  3.14* 3.84* 4.29*  CALCIUM 8.7* 8.4*  --  8.8* 8.4*  --  8.4* 8.4* 8.6*  PHOS  --  3.5 2.6 2.9 3.9 4.1  --   --  5.6*    Recent Labs  Lab 01/03/17 0708 01/04/17 0951 01/06/17 0508 01/09/17 0455  AST 27 29 21   --    ALT 18 15 10*  --   ALKPHOS 65 59 62  --   BILITOT 1.1 1.5* 1.0  --   PROT 6.4* 6.5 6.2*  --   ALBUMIN 3.3* 3.0* 2.4* 2.0*    Recent Labs  Lab 01/03/17 0708  01/04/17 0951 01/05/17 0245 01/06/17 0508 01/08/17 0359 01/08/17 1640 01/09/17 0455  WBC 9.0   < > 11.1* 14.2* 12.0* 6.4  --  6.3  NEUTROABS 6.4  --  9.1* 12.5*  --   --   --  5.4  HGB 11.3*   < > 11.5* 11.1* 9.7* 8.5* 8.7* 8.0*  HCT 34.7*   < > 33.8* 33.7* 29.7* 25.8* 26.2* 23.6*  MCV 94.3   < > 93.6 94.7 93.1 91.2  --  90.8  PLT 197   < > 166 257 232 152  --  150   < > = values in this interval not displayed.    Recent Labs  Lab 01/08/17 1542 01/08/17 2017 01/09/17 0020 01/09/17 0450 01/09/17 0842  GLUCAP 160* 210* 257* 277* 263*   Studies/Results: Ct Abdomen Pelvis Wo Contrast  Result Date: 01/08/2017 CLINICAL DATA:  Abdominal distention. EXAM: CT ABDOMEN AND PELVIS WITHOUT CONTRAST TECHNIQUE: Multidetector CT imaging of the abdomen and pelvis was performed following the standard protocol without IV contrast. COMPARISON:  05/23/2016 FINDINGS: Motion degraded study. Lower chest: The heart is enlarged. Coronary artery calcification is evident. Tiny right pleural effusion. Emphysema with bilateral ground-glass attenuation. 2.1 cm nodular opacity identified left lung base on image 7 series 4. Hepatobiliary: No focal abnormality in the liver on this study without intravenous contrast. gallbladder is distended with layering sludge or tiny stones. There is probably some pericholecystic fluid although this appearance could be artifactual from the motion. No intrahepatic or extrahepatic biliary dilation. Pancreas: Patient is status post distal pancreatectomy. Left upper quadrant surgical drain seen in the region of the pancreatectomy bed on the prior study is no longer evident. 5.4 x 2.8 cm apparent fluid collection identified in the pancreatectomy bed appears new in the interval but is not well evaluated given the lack of  intravenous contrast and motion artifact. Spleen: No splenomegaly. Adrenals/Urinary Tract: No adrenal nodule or mass. Native kidneys are dramatically atrophic. 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 Similar appearance probable 16 mm cyst in the interpolar region. Mild fullness intrarenal collecting system without overt hydroureteronephrosis. Bladder is decompressed by a Foley catheter and gas within the bladder lumen is compatible with the instrumentation. Stomach/Bowel: NG tube decompresses the stomach. Duodenum is normally positioned as is the ligament of Treitz. No evidence for small bowel obstruction. Terminal ileum unremarkable. Right colon is fluid-filled. Sigmoid colonic anastomosis is similar to prior, being distended and filled with stool. Vascular/Lymphatic: 4.1 cm abdominal aortic aneurysm measured on the prior study is 4.3 cm at the same level today. There is no gastrohepatic or hepatoduodenal ligament lymphadenopathy. No intraperitoneal or retroperitoneal lymphadenopathy. No pelvic sidewall lymphadenopathy. Reproductive: Uterus unremarkable.  There is no adnexal mass. Other: small volume free fluid identified in the peritoneal cavity. Musculoskeletal: Degenerative changes noted in the hips, left greater than right. Bones are diffusely demineralized. Diffuse body wall edema evident. IMPRESSION: 1. Study markedly degraded by patient motion and limited by lack of intravenous contrast material. 2. Gallbladder is distended with layering sludge or tiny stones. There appears to be pericholecystic fluid although assessment is hindered by the motion artifact. Abdominal ultrasound may prove helpful to further evaluate. 3. 5 x 3 cm apparent fluid collection in the distal pancreatectomy bed at the site of the previous drainage catheter. This is not well evaluated given the above limitations, but seroma, hematoma, or abscess could have this appearance. 4.  Lung bases appear better aerated than CT scan of 01/03/2017 although there is persistent bilateral lower lung ground-glass attenuation potentially rated to pulmonary edema or diffuse infection. 5. New focal opacity measuring 2 cm in the left base. Given that this was not present on the study from 5 days ago, it probably represents atelectasis or infection. 6. Similar appearance 4.3 cm abdominal aortic aneurysm. 7. No overt hydronephrosis and left pelvic transplant kidney. Gas in the bladder lumen presumably related to the presence of a Foley catheter. 8. Small volume intraperitoneal free fluid with diffuse body wall edema. Electronically Signed   By: Misty Stanley M.D.   On: 01/08/2017 12:12   Dg Chest Port 1 View  Result Date: 01/08/2017  CLINICAL DATA:  Respiratory failure EXAM: PORTABLE CHEST 1 VIEW COMPARISON:  01/07/2017 FINDINGS: Endotracheal tube in good position. NG in the stomach. Central line in the lower SVC unchanged Interval improvement in diffuse bilateral airspace disease since yesterday. Underlying COPD and scarring. No effusion or collapse. Mild bibasilar atelectasis IMPRESSION: Significant improvement in diffuse bilateral airspace disease which may be due to improvement in edema or pneumonia. Electronically Signed   By: Franchot Gallo M.D.   On: 01/08/2017 07:12   Medications: . sodium chloride 10 mL/hr at 01/09/17 0800  . dexmedetomidine (PRECEDEX) IV infusion 1 mcg/kg/hr (01/09/17 0914)  . diltiazem (CARDIZEM) infusion 15 mg/hr (01/09/17 0900)  . feeding supplement (VITAL AF 1.2 CAL) 1,000 mL (01/09/17 0600)  . fentaNYL infusion INTRAVENOUS Stopped (01/09/17 0900)  . piperacillin-tazobactam (ZOSYN)  IV 2.25 g (01/09/17 0917)   . atorvastatin  20 mg Per Tube Daily  . chlorhexidine gluconate (MEDLINE KIT)  15 mL Mouth Rinse BID  . cycloSPORINE  1 drop Both Eyes BID  . docusate  100 mg Per Tube BID  . hydrocortisone sod succinate (SOLU-CORTEF) inj  50 mg Intravenous Q6H  . insulin  aspart  0-20 Units Subcutaneous Q4H  . insulin glargine  50 Units Subcutaneous QHS  . ipratropium  0.5 mg Nebulization Q6H  . levalbuterol  0.63 mg Nebulization Q6H  . mouth rinse  15 mL Mouth Rinse QID  . pantoprazole sodium  40 mg Per Tube Q24H  . sennosides  5 mL Per Tube BID    Background: 71 y.o. year-old with hx of ESRD sp renal transplant (LRD, 1984, pred/ imuran) Presented 11/14 w/SOB/ DOE and some mild CP. Recent new AF on BB and Eliquis. Hx of dCHF, not on diuretics PTA. Hypoxic on adm. BNP was 2000, initial Rx IV diltiazem for afib /RVR, not felt to be vol overloaded.  CT angio w/ contrast no PE but RUL air space disease, pulm edema vs infection.  Lactic acid 1.45, WBC 9k. Admitted, rx'd with IV diltiazem, po metoprolol, IV abx (rocephin, vanc/ zosyn) for PNA. Intubated 11/15. Admit creat 0.8, 2.2 on 11/16, 3.14 11/18 w/oliguria. Weight up 50->53 kg. CXR diffuse bilat infiltrates, R> L . Hypotension on levo gtt for about 24 hrs.  BP's in low 100 range.  ECHO showed normal LVEF.  Asked to see for AKI.    Impression: 1. AKI - likely due to IV contrast nephropathy, compounded by relative hypotension, possible sepsis and AF w/RVR.  Creatinine still rising but rate of rise slower and making a little more urine. K fine. No dialysis indications as of yet. Diuretics being held. (Last dosed AM 11/19 80 mg). CVP 10 2. VDRF 3. Multifocal PNA / hx pulm fibrosis and COPD  4. AF with RVR on IV dilt.Dr. Stanford Breed adding low dose IV metoprolol. 5. dCHF 6. Renal transplant - doubt rejection on the front end, creat was normal 0.8 pre CT angio. Home meds imuran (has not received since intubation) and prednisone (getting solucortef). Imuran intentionally on hold because of possible sepsis, would give via NG. 7. Anemia - Hb drifting down. Transfuse prn 8. Metabolic acidosis - mild. Add Na bicarb via tube   Jamal Maes, MD [facility] [phone] pager 01/09/2017, 9:51 AM

## 2017-01-09 NOTE — Progress Notes (Signed)
Patient's arterial line and central line are bleeding at site. Richardson Landry minor NP informed .

## 2017-01-09 NOTE — Progress Notes (Signed)
Progress Note  Patient Name: Morgan Cooper Date of Encounter: 01/09/2017  Primary Cardiologist: Dr Irish Lack  Subjective   Intubated and sedated  Inpatient Medications    Scheduled Meds: . atorvastatin  20 mg Per Tube Daily  . chlorhexidine gluconate (MEDLINE KIT)  15 mL Mouth Rinse BID  . cycloSPORINE  1 drop Both Eyes BID  . docusate  100 mg Per Tube BID  . hydrocortisone sod succinate (SOLU-CORTEF) inj  50 mg Intravenous Q6H  . insulin aspart  0-20 Units Subcutaneous Q4H  . insulin glargine  45 Units Subcutaneous QHS  . ipratropium  0.5 mg Nebulization Q6H  . levalbuterol  0.63 mg Nebulization Q6H  . mouth rinse  15 mL Mouth Rinse QID  . pantoprazole sodium  40 mg Per Tube Q24H  . sennosides  5 mL Per Tube BID   Continuous Infusions: . sodium chloride 10 mL/hr at 01/09/17 0600  . dexmedetomidine (PRECEDEX) IV infusion 1.2 mcg/kg/hr (01/09/17 0600)  . diltiazem (CARDIZEM) infusion 15 mg/hr (01/09/17 0600)  . feeding supplement (VITAL AF 1.2 CAL) 1,000 mL (01/09/17 0600)  . fentaNYL infusion INTRAVENOUS 200 mcg/hr (01/09/17 0600)  . heparin 950 Units/hr (01/09/17 3875)  . piperacillin-tazobactam (ZOSYN)  IV Stopped (01/09/17 0130)   PRN Meds: Place/Maintain arterial line **AND** sodium chloride, fentaNYL, levalbuterol, midazolam, ondansetron (ZOFRAN) IV   Vital Signs    Vitals:   01/09/17 0400 01/09/17 0500 01/09/17 0600 01/09/17 0700  BP: 105/75 134/82 96/65 106/74  Pulse: (!) 107 (!) 117 (!) 110 (!) 107  Resp: (!) 26 (!) 26 (!) 21 (!) 26  Temp: 98.1 F (36.7 C)     TempSrc:      SpO2: 100% 100% 97% 100%  Weight:  121 lb 14.6 oz (55.3 kg)    Height:        Intake/Output Summary (Last 24 hours) at 01/09/2017 0804 Last data filed at 01/09/2017 0706 Gross per 24 hour  Intake 2728.67 ml  Output 1270 ml  Net 1458.67 ml   Filed Weights   01/07/17 0500 01/08/17 0500 01/09/17 0500  Weight: 117 lb 11.6 oz (53.4 kg) 121 lb 11.1 oz (55.2 kg) 121 lb 14.6 oz  (55.3 kg)    Telemetry    Atrial fibrillation with mildly elevated rate- Personally Reviewed   Physical Exam   GEN: WD/frail; sedated Neck: Supple Cardiac: irregular and mildly tachycardic Respiratory: No rhonchi; CTA GI: Soft, ND, no masses MS: No edema Neuro:  [employer] Recent Labs  Lab 01/03/17 0708  01/04/17 0951  01/06/17 0508 01/07/17 0511 01/08/17 0359 01/09/17 0455  NA 135   < > 136   < > 132* 133* 133* 135  K 4.3   < > 4.1   < > 3.8 3.8 4.3 3.5  CL 104   < > 102   < > 100* 101 101 101  CO2 22   < > 25   < > 21* 21* 20* 18*  GLUCOSE 182*   < > 188*   < > 365* 270* 269* 278*  BUN 10   < > 11   < > 41* 64* 90* 124*  CREATININE 0.99   < > 0.90   < > 2.20* 3.14* 3.84* 4.29*  CALCIUM 8.6*   < > 8.7*   < > 8.4* 8.4* 8.4* 8.6*  PROT 6.4*  --  6.5  --  6.2*  --   --   --   ALBUMIN 3.3*  --  3.0*  --  2.4*  --   --  2.0*  AST 27  --  29  --  21  --   --   --   ALT 18  --  15  --  10*  --   --   --   ALKPHOS 65  --  59  --  62  --   --   --   BILITOT 1.1  --  1.5*  --  1.0  --   --   --   GFRNONAA 56*   < > >60   < > 21* 14* 11* 9*  GFRAA >60   < > >60   < > 25* 16* 13* 11*  ANIONGAP 9   < > 9   < > _0 16*   < > = values in this interval not displayed.     Hematology Recent Labs  Lab 01/06/17 0508 01/08/17 0359 01/08/17 1640 01/09/17 0455  WBC 12.0* 6.4  --  6.3  RBC 3.19* 2.83*  --  2.60*  HGB 9.7* 8.5* 8.7* 8.0*  HCT 29.7* 25.8* 26.2* 23.6*  MCV 93.1 91.2  --  90.8  MCH 30.4 30.0  --  30.8  MCHC 32.7 32.9  --  33.9  RDW 14.6 14.7  --  15.1  PLT 232 152  --  150     Recent Labs  Lab 01/03/17 0717  TROPIPOC 0.03     BNP Recent Labs  Lab 01/03/17 0708  BNP 2,077.8*      Radiology    Ct Abdomen Pelvis Wo Contrast  Result Date: 01/08/2017 CLINICAL DATA:  Abdominal distention. EXAM: CT ABDOMEN AND PELVIS WITHOUT CONTRAST TECHNIQUE: Multidetector CT imaging of the abdomen and pelvis was performed following the  standard protocol without IV contrast. COMPARISON:  05/23/2016 FINDINGS: Motion degraded study. Lower chest: The heart is enlarged. Coronary artery calcification is evident. Tiny right pleural effusion. Emphysema with bilateral ground-glass attenuation. 2.1 cm nodular opacity identified left lung base on image 7 series 4. Hepatobiliary: No focal abnormality in the liver on this study without intravenous contrast. gallbladder is distended with layering sludge or tiny stones. There is probably some pericholecystic fluid although this appearance could be artifactual from the motion. No intrahepatic or extrahepatic biliary dilation. Pancreas: Patient is status post distal pancreatectomy. Left upper quadrant surgical drain seen in the region of the pancreatectomy bed on the prior study is no longer evident. 5.4 x 2.8 cm apparent fluid collection identified in the pancreatectomy bed appears new in the interval but is not well evaluated given the lack of intravenous contrast and motion artifact. Spleen: No splenomegaly. Adrenals/Urinary Tract: No adrenal nodule or mass. Native kidneys are dramatically atrophic. 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 Similar appearance probable 16 mm cyst in the interpolar region. Mild fullness intrarenal collecting system without overt hydroureteronephrosis. Bladder is decompressed by a Foley catheter and gas within the bladder lumen is compatible with the instrumentation. Stomach/Bowel: NG tube decompresses the stomach. Duodenum is normally positioned as is the ligament of Treitz. No evidence for small bowel obstruction. Terminal ileum unremarkable. Right colon is fluid-filled. Sigmoid colonic anastomosis is similar to prior, being distended and filled with stool. Vascular/Lymphatic: 4.1 cm abdominal aortic aneurysm measured on the prior study is 4.3 cm at the same level today. There is no gastrohepatic or hepatoduodenal  ligament lymphadenopathy. No intraperitoneal or retroperitoneal lymphadenopathy. No pelvic sidewall lymphadenopathy.  Reproductive: Uterus unremarkable.  There is no adnexal mass. Other: small volume free fluid identified in the peritoneal cavity. Musculoskeletal: Degenerative changes noted in the hips, left greater than right. Bones are diffusely demineralized. Diffuse body wall edema evident. IMPRESSION: 1. Study markedly degraded by patient motion and limited by lack of intravenous contrast material. 2. Gallbladder is distended with layering sludge or tiny stones. There appears to be pericholecystic fluid although assessment is hindered by the motion artifact. Abdominal ultrasound may prove helpful to further evaluate. 3. 5 x 3 cm apparent fluid collection in the distal pancreatectomy bed at the site of the previous drainage catheter. This is not well evaluated given the above limitations, but seroma, hematoma, or abscess could have this appearance. 4. Lung bases appear better aerated than CT scan of 01/03/2017 although there is persistent bilateral lower lung ground-glass attenuation potentially rated to pulmonary edema or diffuse infection. 5. New focal opacity measuring 2 cm in the left base. Given that this was not present on the study from 5 days ago, it probably represents atelectasis or infection. 6. Similar appearance 4.3 cm abdominal aortic aneurysm. 7. No overt hydronephrosis and left pelvic transplant kidney. Gas in the bladder lumen presumably related to the presence of a Foley catheter. 8. Small volume intraperitoneal free fluid with diffuse body wall edema. Electronically Signed   By: Misty Stanley M.D.   On: 01/08/2017 12:12   Dg Chest Port 1 View  Result Date: 01/08/2017 CLINICAL DATA:  Respiratory failure EXAM: PORTABLE CHEST 1 VIEW COMPARISON:  01/07/2017 FINDINGS: Endotracheal tube in good position. NG in the stomach. Central line in the lower SVC unchanged Interval improvement in diffuse  bilateral airspace disease since yesterday. Underlying COPD and scarring. No effusion or collapse. Mild bibasilar atelectasis IMPRESSION: Significant improvement in diffuse bilateral airspace disease which may be due to improvement in edema or pneumonia. Electronically Signed   By: Franchot Gallo M.D.   On: 01/08/2017 07:12    Patient Profile     71 y.o. female with new onset atrial fibrillation with rapid ventricular response, possible pneumonia and acute on chronic kidney disease. Echocardiogram this admission shows normal LV systolic function, moderate biatrial enlargement and mild right ventricular enlargement.   Assessment & Plan    1 atrial fibrillation with rapid ventricular response-patient remains in atrial fibrillation. Heart rate mildly elevated when agitated. Continue Cardizem for rate control; add metoprolol 2.5 mg IV every six hours for HR > 100. Continue IV heparin. Atrial fibrillation likely contributing to diastolic congestive heart failure. Would like to cardiovert after she improves.  2 chronic diastolic congestive heart failure-patient is not markedly volume overloaded on examination; CVP 10. Lasix on hold due to worsening renal function.   3 possible pneumonia-antibiotics per critical care medicine.  4 acute kidney disease with prior renal transplant-Renal function continues to decline. UOP 1370. Nephrology following; may need dialysis.  5 VDRF-per CCM  6 AAA-will need fu as outpt  For questions or updates, please contact [place] Please consult [email or web address] for contact info under Cardiology/STEMI.      Signed, Kirk Ruths, MD  01/09/2017, 8:04 AM

## 2017-01-10 ENCOUNTER — Inpatient Hospital Stay (HOSPITAL_COMMUNITY): Payer: Medicare Other

## 2017-01-10 DIAGNOSIS — J9601 Acute respiratory failure with hypoxia: Secondary | ICD-10-CM

## 2017-01-10 LAB — COMPREHENSIVE METABOLIC PANEL
ALBUMIN: 2 g/dL — AB (ref 3.5–5.0)
ALK PHOS: 52 U/L (ref 38–126)
ALT: 17 U/L (ref 14–54)
ANION GAP: 13 (ref 5–15)
AST: 18 U/L (ref 15–41)
BUN: 145 mg/dL — ABNORMAL HIGH (ref 6–20)
CHLORIDE: 105 mmol/L (ref 101–111)
CO2: 21 mmol/L — AB (ref 22–32)
Calcium: 8.6 mg/dL — ABNORMAL LOW (ref 8.9–10.3)
Creatinine, Ser: 4.31 mg/dL — ABNORMAL HIGH (ref 0.44–1.00)
GFR calc non Af Amer: 9 mL/min — ABNORMAL LOW (ref >60)
GFR, EST AFRICAN AMERICAN: 11 mL/min — AB (ref >60)
GLUCOSE: 278 mg/dL — AB (ref 65–99)
Potassium: 3.4 mmol/L — ABNORMAL LOW (ref 3.5–5.1)
SODIUM: 139 mmol/L (ref 135–145)
Total Bilirubin: 0.8 mg/dL (ref 0.3–1.2)
Total Protein: 5.3 g/dL — ABNORMAL LOW (ref 6.5–8.1)

## 2017-01-10 LAB — PHOSPHORUS: PHOSPHORUS: 6.3 mg/dL — AB (ref 2.5–4.6)

## 2017-01-10 LAB — GLUCOSE, CAPILLARY
GLUCOSE-CAPILLARY: 260 mg/dL — AB (ref 65–99)
GLUCOSE-CAPILLARY: 213 mg/dL — AB (ref 65–99)
Glucose-Capillary: 220 mg/dL — ABNORMAL HIGH (ref 65–99)
Glucose-Capillary: 262 mg/dL — ABNORMAL HIGH (ref 65–99)
Glucose-Capillary: 279 mg/dL — ABNORMAL HIGH (ref 65–99)

## 2017-01-10 LAB — CBC WITH DIFFERENTIAL/PLATELET
Basophils Absolute: 0 10*3/uL (ref 0.0–0.1)
Basophils Relative: 0 %
EOS ABS: 0 10*3/uL (ref 0.0–0.7)
Eosinophils Relative: 0 %
HEMATOCRIT: 22.2 % — AB (ref 36.0–46.0)
HEMOGLOBIN: 7.7 g/dL — AB (ref 12.0–15.0)
LYMPHS ABS: 0.6 10*3/uL — AB (ref 0.7–4.0)
LYMPHS PCT: 8 %
MCH: 31.4 pg (ref 26.0–34.0)
MCHC: 34.7 g/dL (ref 30.0–36.0)
MCV: 90.6 fL (ref 78.0–100.0)
MONOS PCT: 4 %
Monocytes Absolute: 0.3 10*3/uL (ref 0.1–1.0)
NEUTROS ABS: 6.3 10*3/uL (ref 1.7–7.7)
NEUTROS PCT: 88 %
Platelets: 178 10*3/uL (ref 150–400)
RBC: 2.45 MIL/uL — ABNORMAL LOW (ref 3.87–5.11)
RDW: 15.2 % (ref 11.5–15.5)
WBC: 7.1 10*3/uL (ref 4.0–10.5)

## 2017-01-10 LAB — MAGNESIUM: MAGNESIUM: 2.8 mg/dL — AB (ref 1.7–2.4)

## 2017-01-10 MED ORDER — POTASSIUM CHLORIDE 20 MEQ/15ML (10%) PO SOLN
30.0000 meq | ORAL | Status: AC
  Administered 2017-01-10: 30 meq via ORAL
  Filled 2017-01-10: qty 30

## 2017-01-10 MED ORDER — HYDROCORTISONE NA SUCCINATE PF 100 MG IJ SOLR
50.0000 mg | Freq: Two times a day (BID) | INTRAMUSCULAR | Status: DC
  Administered 2017-01-10 – 2017-01-13 (×6): 50 mg via INTRAVENOUS
  Filled 2017-01-10 (×5): qty 1
  Filled 2017-01-10: qty 2
  Filled 2017-01-10: qty 1

## 2017-01-10 MED ORDER — POTASSIUM CHLORIDE 20 MEQ/15ML (10%) PO SOLN
30.0000 meq | Freq: Once | ORAL | Status: AC
  Administered 2017-01-10: 30 meq
  Filled 2017-01-10: qty 30

## 2017-01-10 MED ORDER — METOPROLOL TARTRATE 5 MG/5ML IV SOLN
2.5000 mg | Freq: Four times a day (QID) | INTRAVENOUS | Status: DC
  Administered 2017-01-10 – 2017-01-11 (×4): 2.5 mg via INTRAVENOUS
  Filled 2017-01-10 (×5): qty 5

## 2017-01-10 MED ORDER — HEPARIN SODIUM (PORCINE) 5000 UNIT/ML IJ SOLN
5000.0000 [IU] | Freq: Three times a day (TID) | INTRAMUSCULAR | Status: DC
  Administered 2017-01-10 – 2017-01-16 (×18): 5000 [IU] via SUBCUTANEOUS
  Filled 2017-01-10 (×21): qty 1

## 2017-01-10 MED ORDER — QUETIAPINE FUMARATE 50 MG PO TABS
50.0000 mg | ORAL_TABLET | Freq: Two times a day (BID) | ORAL | Status: DC
  Administered 2017-01-10 – 2017-01-14 (×9): 50 mg
  Filled 2017-01-10 (×10): qty 1

## 2017-01-10 MED ORDER — CHLORHEXIDINE GLUCONATE CLOTH 2 % EX PADS
6.0000 | MEDICATED_PAD | Freq: Every day | CUTANEOUS | Status: DC
  Administered 2017-01-10 – 2017-01-17 (×8): 6 via TOPICAL

## 2017-01-10 MED ORDER — AZATHIOPRINE 50 MG PO TABS
50.0000 mg | ORAL_TABLET | Freq: Every day | ORAL | Status: DC
  Administered 2017-01-10 – 2017-01-24 (×15): 50 mg via ORAL
  Filled 2017-01-10 (×16): qty 1

## 2017-01-10 NOTE — Progress Notes (Signed)
Inpatient Diabetes Program Recommendations  AACE/ADA: New Consensus Statement on Inpatient Glycemic Control (2015)  Target Ranges:  Prepandial:   less than 140 mg/dL      Peak postprandial:   less than 180 mg/dL (1-2 hours)      Critically ill patients:  140 - 180 mg/dL   Results for REGENIA, ERCK (MRN [id number]) as of 01/10/2017 09:08  Ref. Range 01/09/2017 04:50 01/09/2017 08:42 01/09/2017 12:44 01/09/2017 15:40 01/09/2017 19:23 01/10/2017 00:25 01/10/2017 07:45  Glucose-Capillary Latest Ref Range: 65 - 99 mg/dL 277 (H) 263 (H) 251 (H) 163 (H) 183 (H) 220 (H) 262 (H)   Review of Glycemic Control  Diabetes history: No Outpatient Diabetes medications: NA Current orders for Inpatient glycemic control: Lantus 50 units QHS, Novolog 0-20 units Q4H; Solucortef 50 mg Q6H  Inpatient Diabetes Program Recommendations: Insulin-Basal: If steroids are continued as ordered, please consider increasing Lantus to 55 units QHS. Insulin-Tube Feeding Coverage: Please consider ordering Novolog 5 units Q4H for tube feeding coverage. If tube feeding is stopped or held, Novolog tube feeding coverage should also be stopped or held. Insulin-Correction: Please consider using ICU Glycemic Control order set with Novolog correction Q4H (Phase 1).  Thanks, Barnie Alderman, RN, MSN, CDE Diabetes Coordinator Inpatient Diabetes Program [phone] (Team Pager from 8am to 5pm)

## 2017-01-10 NOTE — Progress Notes (Signed)
Progress Note  Patient Name: Morgan Cooper Date of Encounter: 01/10/2017  Primary Cardiologist: Dr Irish Lack  Subjective   Opens eyes; no purposeful movements  Inpatient Medications    Scheduled Meds: . atorvastatin  20 mg Per Tube Daily  . chlorhexidine gluconate (MEDLINE KIT)  15 mL Mouth Rinse BID  . cycloSPORINE  1 drop Both Eyes BID  . docusate  100 mg Per Tube BID  . hydrocortisone sod succinate (SOLU-CORTEF) inj  50 mg Intravenous Q6H  . insulin aspart  0-20 Units Subcutaneous Q4H  . insulin glargine  50 Units Subcutaneous QHS  . mouth rinse  15 mL Mouth Rinse QID  . pantoprazole sodium  40 mg Per Tube Q24H  . potassium chloride  30 mEq Per Tube Once  . sennosides  5 mL Per Tube BID  . sodium bicarbonate  1,300 mg Per Tube TID   Continuous Infusions: . sodium chloride 10 mL/hr at 01/10/17 0700  . dexmedetomidine (PRECEDEX) IV infusion 1.2 mcg/kg/hr (01/10/17 0700)  . diltiazem (CARDIZEM) infusion 15 mg/hr (01/10/17 0700)  . feeding supplement (VITAL AF 1.2 CAL) 1,000 mL (01/10/17 0700)  . fentaNYL infusion INTRAVENOUS 250 mcg/hr (01/10/17 0700)  . piperacillin-tazobactam (ZOSYN)  IV Stopped (01/10/17 0312)   PRN Meds: Place/Maintain arterial line **AND** sodium chloride, fentaNYL, levalbuterol, metoprolol tartrate, midazolam, ondansetron (ZOFRAN) IV   Vital Signs    Vitals:   01/10/17 0600 01/10/17 0700 01/10/17 0749 01/10/17 0822  BP: 115/79 (!) 144/68  122/65  Pulse: (!) 112 (!) 125  (!) 105  Resp: (!) 26 (!) 26  (!) 26  Temp:   99.4 F (37.4 C)   TempSrc:   Oral   SpO2: 94% 96%  96%  Weight:      Height:        Intake/Output Summary (Last 24 hours) at 01/10/2017 0832 Last data filed at 01/10/2017 0700 Gross per 24 hour  Intake 2509.83 ml  Output 1531 ml  Net 978.83 ml   Filed Weights   01/07/17 0500 01/08/17 0500 01/09/17 0500  Weight: 117 lb 11.6 oz (53.4 kg) 121 lb 11.1 oz (55.2 kg) 121 lb 14.6 oz (55.3 kg)    Telemetry    Atrial  fibrillation; rate increased- Personally Reviewed   Physical Exam   GEN: WD/frail; intubated and sedated Neck: No JVD Cardiac: irregular and tachycardic Respiratory: CTA anteriorly GI: No distention or masses MS: 1+ ankle edema Neuro:  Intubated and sedated; opens eyes   Labs    Chemistry Recent Labs  Lab 01/04/17 0951  01/06/17 0508  01/08/17 0359 01/09/17 0455 01/10/17 0438  NA 136   < > 132*   < > 133* 135 139  K 4.1   < > 3.8   < > 4.3 3.5 3.4*  CL 102   < > 100*   < > 101 101 105  CO2 25   < > 21*   < > 20* 18* 21*  GLUCOSE 188*   < > 365*   < > 269* 278* 278*  BUN 11   < > 41*   < > 90* 124* 145*  CREATININE 0.90   < > 2.20*   < > 3.84* 4.29* 4.31*  CALCIUM 8.7*   < > 8.4*   < > 8.4* 8.6* 8.6*  PROT 6.5  --  6.2*  --   --   --  5.3*  ALBUMIN 3.0*  --  2.4*  --   --  2.0* 2.0*  AST  29  --  21  --   --   --  18  ALT 15  --  10*  --   --   --  17  ALKPHOS 59  --  62  --   --   --  52  BILITOT 1.5*  --  1.0  --   --   --  0.8  GFRNONAA >60   < > 21*   < > 11* 9* 9*  GFRAA >60   < > 25*   < > 13* 11* 11*  ANIONGAP 9   < > 11   < > 12 16* 13   < > = values in this interval not displayed.     Hematology Recent Labs  Lab 01/08/17 0359 01/08/17 1640 01/09/17 0455 01/10/17 0439  WBC 6.4  --  6.3 7.1  RBC 2.83*  --  2.60* 2.45*  HGB 8.5* 8.7* 8.0* 7.7*  HCT 25.8* 26.2* 23.6* 22.2*  MCV 91.2  --  90.8 90.6  MCH 30.0  --  30.8 31.4  MCHC 32.9  --  33.9 34.7  RDW 14.7  --  15.1 15.2  PLT 152  --  150 178        Radiology    Ct Abdomen Pelvis Wo Contrast  Result Date: 01/08/2017 CLINICAL DATA:  Abdominal distention. EXAM: CT ABDOMEN AND PELVIS WITHOUT CONTRAST TECHNIQUE: Multidetector CT imaging of the abdomen and pelvis was performed following the standard protocol without IV contrast. COMPARISON:  05/23/2016 FINDINGS: Motion degraded study. Lower chest: The heart is enlarged. Coronary artery calcification is evident. Tiny right pleural effusion.  Emphysema with bilateral ground-glass attenuation. 2.1 cm nodular opacity identified left lung base on image 7 series 4. Hepatobiliary: No focal abnormality in the liver on this study without intravenous contrast. gallbladder is distended with layering sludge or tiny stones. There is probably some pericholecystic fluid although this appearance could be artifactual from the motion. No intrahepatic or extrahepatic biliary dilation. Pancreas: Patient is status post distal pancreatectomy. Left upper quadrant surgical drain seen in the region of the pancreatectomy bed on the prior study is no longer evident. 5.4 x 2.8 cm apparent fluid collection identified in the pancreatectomy bed appears new in the interval but is not well evaluated given the lack of intravenous contrast and motion artifact. Spleen: No splenomegaly. Adrenals/Urinary Tract: No adrenal nodule or mass. Native kidneys are dramatically atrophic. 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 Similar appearance probable 16 mm cyst in the interpolar region. Mild fullness intrarenal collecting system without overt hydroureteronephrosis. Bladder is decompressed by a Foley catheter and gas within the bladder lumen is compatible with the instrumentation. Stomach/Bowel: NG tube decompresses the stomach. Duodenum is normally positioned as is the ligament of Treitz. No evidence for small bowel obstruction. Terminal ileum unremarkable. Right colon is fluid-filled. Sigmoid colonic anastomosis is similar to prior, being distended and filled with stool. Vascular/Lymphatic: 4.1 cm abdominal aortic aneurysm measured on the prior study is 4.3 cm at the same level today. There is no gastrohepatic or hepatoduodenal ligament lymphadenopathy. No intraperitoneal or retroperitoneal lymphadenopathy. No pelvic sidewall lymphadenopathy. Reproductive: Uterus unremarkable.  There is no adnexal mass. Other: small volume free fluid  identified in the peritoneal cavity. Musculoskeletal: Degenerative changes noted in the hips, left greater than right. Bones are diffusely demineralized. Diffuse body wall edema evident. IMPRESSION: 1. Study markedly degraded by patient motion and limited by lack of intravenous contrast  material. 2. Gallbladder is distended with layering sludge or tiny stones. There appears to be pericholecystic fluid although assessment is hindered by the motion artifact. Abdominal ultrasound may prove helpful to further evaluate. 3. 5 x 3 cm apparent fluid collection in the distal pancreatectomy bed at the site of the previous drainage catheter. This is not well evaluated given the above limitations, but seroma, hematoma, or abscess could have this appearance. 4. Lung bases appear better aerated than CT scan of 01/03/2017 although there is persistent bilateral lower lung ground-glass attenuation potentially rated to pulmonary edema or diffuse infection. 5. New focal opacity measuring 2 cm in the left base. Given that this was not present on the study from 5 days ago, it probably represents atelectasis or infection. 6. Similar appearance 4.3 cm abdominal aortic aneurysm. 7. No overt hydronephrosis and left pelvic transplant kidney. Gas in the bladder lumen presumably related to the presence of a Foley catheter. 8. Small volume intraperitoneal free fluid with diffuse body wall edema. Electronically Signed   By: Misty Stanley M.D.   On: 01/08/2017 12:12   Dg Chest Port 1 View  Result Date: 01/10/2017 CLINICAL DATA:  Hypoxia EXAM: PORTABLE CHEST 1 VIEW COMPARISON:  January 08, 2017 chest radiograph and chest CT January 03, 2017 FINDINGS: Endotracheal tube tip is 4.3 cm above the carina. Nasogastric tube tip and side port are below the diaphragm with the side port seen in the stomach. Central catheter tip is in the superior vena cava. No pneumothorax. There is a skin fold on the right. There is fibrosis bilaterally. There is new  consolidation in the left base with small left pleural effusion. Heart is upper normal in size with pulmonary vascular within normal limits. There is aortic atherosclerosis. No evident adenopathy. No appreciable bone lesions. IMPRESSION: Tube and catheter positions as described without pneumothorax. There is new airspace consolidation lateral left base with small pleural effusion. Suspect focal pneumonia in the left base. There is underlying pulmonary fibrotic change. Cardiac silhouette is stable.  There is aortic atherosclerosis. Aortic Atherosclerosis (ICD10-I70.0). Electronically Signed   By: Lowella Grip III M.D.   On: 01/10/2017 07:23    Patient Profile     71 y.o. female with new onset atrial fibrillation with rapid ventricular response, possible pneumonia and acute on chronic kidney disease. Echocardiogram this admission shows normal LV systolic function, moderate biatrial enlargement and mild right ventricular enlargement.   Assessment & Plan    1 atrial fibrillation with rapid ventricular response-patient remains in atrial fibrillation. Heart rate elevated. Continue Cardizem for rate control; Add IV metoprolol q 6 and follow. Heparin DCed due to concern for bleeding. Atrial fibrillation likely contributing to diastolic congestive heart failure. Cannot convert until she is able to tolerate anticoagulation.  2 chronic diastolic congestive heart failure-UOP 1580 over last 24 hrs; lasix on hold due to acute renal insufficiency.  3 possible pneumonia-antibiotics per critical care medicine.  4 acute kidney disease with prior renal transplant-Cr plateaued compared to yesterday; nephrology following. Felt to be related to contrast nephropathy.  5 VDRF-per CCM  6 AAA-will need fu as outpt  For questions or updates, please contact [place] Please consult [email or web address] for contact info under Cardiology/STEMI.      Signed, Kirk Ruths, MD  01/10/2017, 8:32 AM

## 2017-01-10 NOTE — Progress Notes (Signed)
PULMONARY / CRITICAL CARE MEDICINE   Name: Morgan Cooper MRN: [id number] DOB: Aug 26, 1945    ADMISSION DATE:  01/03/2017 CONSULTATION DATE:  01/04/17  REFERRING MD:  TRH  CHIEF COMPLAINT:  SOB  HISTORY OF PRESENT ILLNESS:   71 yo female former smoker presented with dyspnea and hypoxia.  She had A fib with RVR and concern for HCAP with progression pulmonary infiltrates.  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  SUBJECTIVE:  Unable to provide 2/2 ETT. Able to track with eyes.   REVIEW OF SYSTEMS:  Cannot provide history 2/2 sedation.   VITAL SIGNS: BP 115/79   Pulse (!) 112   Temp 98.5 F (36.9 C) (Oral)   Resp (!) 26   Ht 5\' 5"  (1.651 m)   Wt 121 lb 14.6 oz (55.3 kg)   SpO2 94%   BMI 20.29 kg/m   HEMODYNAMICS: CVP:  [5 mmHg-7 mmHg] 7 mmHg  VENTILATOR SETTINGS: Vent Mode: PCV FiO2 (%):  [30 %-40 %] 30 % Set Rate:  [24 bmp-26 bmp] 26 bmp Vt Set:  [500 mL] 500 mL PEEP:  [10 cmH20-14 cmH20] 12 cmH20 Plateau Pressure:  [24 cmH20-31 cmH20] 26 cmH20  INTAKE / OUTPUT: I/O last 3 completed shifts: In: 3856.9 [I.V.:2126.9; NG/GT:1580; IV Piggyback:150] Out: 1478 [Urine:1580; Stool:1]  PHYSICAL EXAMINATION:  General - elderly female lying in bed on ventilator Eyes - pupils reactive ENT - ETT in place Cardiac - irregular, no murmur Chest - b/l rales Abd - soft, non tender Ext -trace edema Skin - no rashes Neuro - RASS 0  LABS:  BMET Recent Labs  Lab 01/08/17 0359 01/09/17 0455 01/10/17 0438  NA 133* 135 139  K 4.3 3.5 3.4*  CL 101 101 105  CO2 20* 18* 21*  BUN 90* 124* 145*  CREATININE 3.84* 4.29* 4.31*  GLUCOSE 269* 278* 278*    Electrolytes Recent Labs  Lab 01/06/17 1557  01/08/17 0359 01/09/17 0455 01/10/17 0438 01/10/17 0439  CALCIUM  --    <  > 8.4* 8.6* 8.6*  --   MG 2.8*  --   --  2.8*  --  2.8*  PHOS 4.1  --   --  5.6*  --  6.3*   < > = values in this interval not displayed.    CBC Recent Labs  Lab 01/08/17 0359 01/08/17 1640 01/09/17 0455 01/10/17 0439  WBC 6.4  --  6.3 7.1  HGB 8.5* 8.7* 8.0* 7.7*  HCT 25.8* 26.2* 23.6* 22.2*  PLT 152  --  150 178    Coag's Recent Labs  Lab 01/07/17 1156 01/08/17 0359 01/09/17 0455  APTT 75* 83* 130*    Sepsis Markers Recent Labs  Lab 01/03/17 1004 01/03/17 1158 01/05/17 0245 01/05/17 1656 01/06/17 0508 01/07/17 0511  LATICACIDVEN 1.45 1.70  --  1.9  --   --   PROCALCITON  --   --  0.52  --  1.59 1.49    ABG Recent Labs  Lab 01/07/17 0446 01/07/17 1043 01/08/17 0307  PHART 7.267* 7.287* 7.354  PCO2ART 43.4 42.6 33.9  PO2ART 56.0* 123* 179*    Liver Enzymes Recent Labs  Lab 01/04/17 0951 01/06/17 0508 01/09/17 0455 01/10/17 0438  AST 29 21  --  18  ALT 15 10*  --  17  ALKPHOS 59 62  --  52  BILITOT 1.5* 1.0  --  0.8  ALBUMIN 3.0* 2.4* 2.0* 2.0*    Cardiac Enzymes No results for input(s): TROPONINI, PROBNP in the last 168 hours.  Glucose Recent Labs  Lab 01/09/17 0450 01/09/17 0842 01/09/17 1244 01/09/17 1540 01/09/17 1923 01/10/17 0025  GLUCAP 277* 263* 251* 163* 183* 220*    Imaging No results found.   STUDIES:  CTA PE 11/14 >> no PE, diffuse RUL airspace disease superimposed on emphysema and ILD Echo 11/15 >> EF 55 to 60%, PAS 39 mmHg  CULTURES: Legionella Ag 11/14 >> negative Pneumococcal Ag 11/14 >> negative Influenza PCR 11/14 >> negative Blood 11/14 >> negative Respiratory viral panel 11/15 >> negative Sputum 11/16 >> no growth PCP 11/16 >> negative  ANTIBIOTICS: Zithromax 11/14 >> 11/25 Rocephin 11/14 >> 11/15 Vancomycin 11/14 >> 11/18 Zosyn 11/15 >>   SIGNIFICANT EVENTS: 11/14 Admit 11/16 to ICU 11/18 Renal consulted  LINES/TUBES: ETT 11/16 >> Rt IJ CVL 11/16 >>  Lt radial aline 11/16 >>  11/20  DISCUSSION: 71 yo female with VDRF from HCAP, pulmonary edema in setting of A fib with RVR and acute on chronic diastolic CHF.  Has hx of COPD, ILD, and renal transplant.  ASSESSMENT / PLAN:  Acute on chronic hypoxic respiratory failure. CXR from 11/18 improved after diuresis (80mg  q8 hours). Tolerates PC better than PRVC. CXR calls possible new consolidation, but poor image quality.  - HCAP, pulmonary edema, COPD, and ILD - continue zosyn, end date 01/11/17  A fib with RVR. Acute on chronic diastolic CHF. - changed to heparin gtt in place of eliquis with worsening renal fx - diltiazem gtt per cardiology - continue lipitor  Hx of renal transplant. Acute renal failure with ATN. Cr uptrend seems to be stabilizing.  - consulted nephrology 11/18 - decrease solucortef to 50mg  q12 hrs - s/p lasix, hold additional diuresis - restart imuran per tube at renally adjusted dose  Severe protein calorie malnutrition. - continue tube feeds  Steroid induced hyperglycemia. - resistant scale for SSI - continue lantus 50 units daily - decreasing steroids, will likely help glucose control  Anemia of critical illness and chronic disease. Likely multifactorial with heme + stool and some resolved bleeding from art line 11/20.  - no anticoagulation for afib - restart heparin DVT ppx - trend CBCs  - transfusion for goal Hgb 8.0  Acute metabolic encephalopathy. Titrating sedation. Currently fentanyl, precedex, and versed PRN. Daughter notes patient does take seroquel at home. - RASS goal 0 to -1 - added seroquel (discussed QTC with Dr. Stanford Breed)   DVT prophylaxis - SCDs, heparin subq SUP - protonix Nutrition - tube feeds Goals of care - full code  Ralene Ok, MD PGY2 FM Resident 7:10 AM

## 2017-01-10 NOTE — Progress Notes (Signed)
[facility] Rounding Note Subjective/Interval History:   Creatinine at plateau 4.3 1.5 liter UOP without diuretics past 24 hours Remains intubated and sedated  Objective Vital signs in last 24 hours: Vitals:   01/10/17 0500 01/10/17 0600 01/10/17 0700 01/10/17 0749  BP: 124/73 115/79 (!) 144/68   Pulse: (!) 116 (!) 112 (!) 125   Resp: (!) 26 (!) 26 (!) 26   Temp:    99.4 F (37.4 C)  TempSrc:    Oral  SpO2: 95% 94% 96%   Weight:      Height:       Weight change:   Intake/Output Summary (Last 24 hours) at 01/10/2017 0802 Last data filed at 01/10/2017 0700 Gross per 24 hour  Intake 2509.83 ml  Output 1531 ml  Net 978.83 ml   Physical Exam:  Blood pressure (!) 144/68, pulse (!) 125, temperature 99.4 F (37.4 C), temperature source Oral, resp. rate (!) 26, height 5' 5"  (1.651 m), weight 55.3 kg (121 lb 14.6 oz), SpO2 96 %.  Physical Examination Intubated, hands in mittens ETT, NG, foley CVP 7 Very frail in appearence Sclera anicteric No jvd or bruits Chest anteriorly coarse BS Cor irreg irreg AF RVR Tachy 130 Abd soft and not distended. + BS Renal allograft left pelvis not tender GU foley in place, clear urine Ext no sig LE or UE edema  Neuro is sedated on the vent   Recent Labs  Lab 01/05/17 0245 01/05/17 1150 01/05/17 1656 01/06/17 0508 01/06/17 1557 01/07/17 0511 01/08/17 0359 01/09/17 0455 01/10/17 0438 01/10/17 0439  NA 136  --  134* 132*  --  133* 133* 135 139  --   K 3.6  --  4.1 3.8  --  3.8 4.3 3.5 3.4*  --   CL 103  --  102 100*  --  101 101 101 105  --   CO2 24  --  24 21*  --  21* 20* 18* 21*  --   GLUCOSE 254*  --  295* 365*  --  270* 269* 278* 278*  --   BUN 14  --  28* 41*  --  64* 90* 124* 145*  --   CREATININE 0.95  --  1.67* 2.20*  --  3.14* 3.84* 4.29* 4.31*  --   CALCIUM 8.4*  --  8.8* 8.4*  --  8.4* 8.4* 8.6* 8.6*  --   PHOS 3.5 2.6 2.9 3.9 4.1  --   --  5.6*  --  6.3*    Recent Labs  Lab 01/04/17 0951 01/06/17 0508 01/09/17 0455  01/10/17 0438  AST 29 21  --  18  ALT 15 10*  --  17  ALKPHOS 59 62  --  52  BILITOT 1.5* 1.0  --  0.8  PROT 6.5 6.2*  --  5.3*  ALBUMIN 3.0* 2.4* 2.0* 2.0*    Recent Labs  Lab 01/04/17 0951 01/05/17 0245 01/06/17 0508 01/08/17 0359 01/08/17 1640 01/09/17 0455 01/10/17 0439  WBC 11.1* 14.2* 12.0* 6.4  --  6.3 7.1  NEUTROABS 9.1* 12.5*  --   --   --  5.4 6.3  HGB 11.5* 11.1* 9.7* 8.5* 8.7* 8.0* 7.7*  HCT 33.8* 33.7* 29.7* 25.8* 26.2* 23.6* 22.2*  MCV 93.6 94.7 93.1 91.2  --  90.8 90.6  PLT 166 257 232 152  --  150 178    Recent Labs  Lab 01/09/17 1244 01/09/17 1540 01/09/17 1923 01/10/17 0025 01/10/17 0745  GLUCAP 251* 163* 183*  220* 262*   Studies/Results: Ct Abdomen Pelvis Wo Contrast  Result Date: 01/08/2017 CLINICAL DATA:  Abdominal distention. EXAM: CT ABDOMEN AND PELVIS WITHOUT CONTRAST TECHNIQUE: Multidetector CT imaging of the abdomen and pelvis was performed following the standard protocol without IV contrast. COMPARISON:  05/23/2016 FINDINGS: Motion degraded study. Lower chest: The heart is enlarged. Coronary artery calcification is evident. Tiny right pleural effusion. Emphysema with bilateral ground-glass attenuation. 2.1 cm nodular opacity identified left lung base on image 7 series 4. Hepatobiliary: No focal abnormality in the liver on this study without intravenous contrast. gallbladder is distended with layering sludge or tiny stones. There is probably some pericholecystic fluid although this appearance could be artifactual from the motion. No intrahepatic or extrahepatic biliary dilation. Pancreas: Patient is status post distal pancreatectomy. Left upper quadrant surgical drain seen in the region of the pancreatectomy bed on the prior study is no longer evident. 5.4 x 2.8 cm apparent fluid collection identified in the pancreatectomy bed appears new in the interval but is not well evaluated given the lack of intravenous contrast and motion artifact. Spleen: No  splenomegaly. Adrenals/Urinary Tract: No adrenal nodule or mass. Native kidneys are dramatically atrophic. 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 Similar appearance probable 16 mm cyst in the interpolar region. Mild fullness intrarenal collecting system without overt hydroureteronephrosis. Bladder is decompressed by a Foley catheter and gas within the bladder lumen is compatible with the instrumentation. Stomach/Bowel: NG tube decompresses the stomach. Duodenum is normally positioned as is the ligament of Treitz. No evidence for small bowel obstruction. Terminal ileum unremarkable. Right colon is fluid-filled. Sigmoid colonic anastomosis is similar to prior, being distended and filled with stool. Vascular/Lymphatic: 4.1 cm abdominal aortic aneurysm measured on the prior study is 4.3 cm at the same level today. There is no gastrohepatic or hepatoduodenal ligament lymphadenopathy. No intraperitoneal or retroperitoneal lymphadenopathy. No pelvic sidewall lymphadenopathy. Reproductive: Uterus unremarkable.  There is no adnexal mass. Other: small volume free fluid identified in the peritoneal cavity. Musculoskeletal: Degenerative changes noted in the hips, left greater than right. Bones are diffusely demineralized. Diffuse body wall edema evident. IMPRESSION: 1. Study markedly degraded by patient motion and limited by lack of intravenous contrast material. 2. Gallbladder is distended with layering sludge or tiny stones. There appears to be pericholecystic fluid although assessment is hindered by the motion artifact. Abdominal ultrasound may prove helpful to further evaluate. 3. 5 x 3 cm apparent fluid collection in the distal pancreatectomy bed at the site of the previous drainage catheter. This is not well evaluated given the above limitations, but seroma, hematoma, or abscess could have this appearance. 4. Lung bases appear better aerated than CT scan of  01/03/2017 although there is persistent bilateral lower lung ground-glass attenuation potentially rated to pulmonary edema or diffuse infection. 5. New focal opacity measuring 2 cm in the left base. Given that this was not present on the study from 5 days ago, it probably represents atelectasis or infection. 6. Similar appearance 4.3 cm abdominal aortic aneurysm. 7. No overt hydronephrosis and left pelvic transplant kidney. Gas in the bladder lumen presumably related to the presence of a Foley catheter. 8. Small volume intraperitoneal free fluid with diffuse body wall edema. Electronically Signed   By: Misty Stanley M.D.   On: 01/08/2017 12:12   Dg Chest Port 1 View  Result Date: 01/10/2017 CLINICAL DATA:  Hypoxia EXAM: PORTABLE CHEST 1 VIEW COMPARISON:  January 08, 2017 chest radiograph  and chest CT January 03, 2017 FINDINGS: Endotracheal tube tip is 4.3 cm above the carina. Nasogastric tube tip and side port are below the diaphragm with the side port seen in the stomach. Central catheter tip is in the superior vena cava. No pneumothorax. There is a skin fold on the right. There is fibrosis bilaterally. There is new consolidation in the left base with small left pleural effusion. Heart is upper normal in size with pulmonary vascular within normal limits. There is aortic atherosclerosis. No evident adenopathy. No appreciable bone lesions. IMPRESSION: Tube and catheter positions as described without pneumothorax. There is new airspace consolidation lateral left base with small pleural effusion. Suspect focal pneumonia in the left base. There is underlying pulmonary fibrotic change. Cardiac silhouette is stable.  There is aortic atherosclerosis. Aortic Atherosclerosis (ICD10-I70.0). Electronically Signed   By: Lowella Grip III M.D.   On: 01/10/2017 07:23   Medications: . sodium chloride 10 mL/hr at 01/10/17 0700  . dexmedetomidine (PRECEDEX) IV infusion 1.2 mcg/kg/hr (01/10/17 0700)  . diltiazem  (CARDIZEM) infusion 15 mg/hr (01/10/17 0700)  . feeding supplement (VITAL AF 1.2 CAL) 1,000 mL (01/10/17 0700)  . fentaNYL infusion INTRAVENOUS 250 mcg/hr (01/10/17 0700)  . piperacillin-tazobactam (ZOSYN)  IV Stopped (01/10/17 4825)   . atorvastatin  20 mg Per Tube Daily  . chlorhexidine gluconate (MEDLINE KIT)  15 mL Mouth Rinse BID  . cycloSPORINE  1 drop Both Eyes BID  . docusate  100 mg Per Tube BID  . hydrocortisone sod succinate (SOLU-CORTEF) inj  50 mg Intravenous Q6H  . insulin aspart  0-20 Units Subcutaneous Q4H  . insulin glargine  50 Units Subcutaneous QHS  . mouth rinse  15 mL Mouth Rinse QID  . pantoprazole sodium  40 mg Per Tube Q24H  . sennosides  5 mL Per Tube BID  . sodium bicarbonate  1,300 mg Per Tube TID  Place/Maintain arterial line **AND** sodium chloride, fentaNYL, levalbuterol, metoprolol tartrate, midazolam, ondansetron (ZOFRAN) IV  Background: 71 y.o. year-old with hx of ESRD sp renal transplant (LRD, 1984, pred/ imuran) Presented 11/14 w/SOB/ DOE and some mild CP. Recent new AF on BB and Eliquis. Hx of dCHF, not on diuretics PTA. Hypoxic on adm. BNP was 2000, initial Rx IV diltiazem for afib /RVR, not felt to be vol overloaded.  CT angio w/ contrast no PE but RUL air space disease, pulm edema vs infection.  Lactic acid 1.45, WBC 9k. Admitted, rx'd with IV diltiazem, po metoprolol, IV abx (rocephin, vanc/ zosyn) for PNA. Intubated 11/15. Admit creat 0.8, 2.2 on 11/16, 3.14 11/18 w/oliguria. Weight up 50->53 kg. CXR diffuse bilat infiltrates, R> L . Hypotension on levo gtt for about 24 hrs.  BP's in low 100 range.  ECHO showed normal LVEF.  Asked to see for AKI.    Impression: 1. AKI - IV contrast nephropathy, relative hypotension, hemodynamic effects possible sepsis, AF w/RVR. Creatinine at plateau, UOP increasing off diuretics. Very azotemic 2/2 high dose steroids. Still without hard dialysis indications. Diuretics being held. (Last dosed Lasix AM 11/19 80 mg). CVP  7 2. Hypokalemia - mild. KCL 30 via tube.  3. VDRF - per CCM 4. Multifocal PNA / hx pulm fibrosis and COPD - new L lateral base consolidation today. Zosyn 5. AF with RVR on IV dilt.Dr. Stanford Breed added low dose IV metoprolol. 6. dCHF 7. Renal transplant - Creat was normal 0.8 pre CT angio. Home meds imuran (has not received since intubation) and prednisone (getting solucortef). Imuran intentionally on  hold because of possible sepsis, would give via NG. 8. Anemia - Hb drifting down. Transfuse prn 9. Metabolic acidosis - mild. Added Na bicarb via tube. Improved.    Jamal Maes, MD [facility] [phone] pager 01/10/2017, 8:02 AM

## 2017-01-11 ENCOUNTER — Inpatient Hospital Stay (HOSPITAL_COMMUNITY): Payer: Medicare Other

## 2017-01-11 DIAGNOSIS — N179 Acute kidney failure, unspecified: Secondary | ICD-10-CM

## 2017-01-11 DIAGNOSIS — I714 Abdominal aortic aneurysm, without rupture: Secondary | ICD-10-CM

## 2017-01-11 LAB — GLUCOSE, CAPILLARY
GLUCOSE-CAPILLARY: 144 mg/dL — AB (ref 65–99)
GLUCOSE-CAPILLARY: 147 mg/dL — AB (ref 65–99)
GLUCOSE-CAPILLARY: 149 mg/dL — AB (ref 65–99)
GLUCOSE-CAPILLARY: 156 mg/dL — AB (ref 65–99)
GLUCOSE-CAPILLARY: 161 mg/dL — AB (ref 65–99)
GLUCOSE-CAPILLARY: 199 mg/dL — AB (ref 65–99)
Glucose-Capillary: 244 mg/dL — ABNORMAL HIGH (ref 65–99)
Glucose-Capillary: 147 mg/dL — ABNORMAL HIGH (ref 65–99)

## 2017-01-11 LAB — CBC WITH DIFFERENTIAL/PLATELET
BASOS ABS: 0 10*3/uL (ref 0.0–0.1)
BASOS PCT: 0 %
EOS ABS: 0 10*3/uL (ref 0.0–0.7)
Eosinophils Relative: 0 %
HCT: 25.3 % — ABNORMAL LOW (ref 36.0–46.0)
HEMOGLOBIN: 8.3 g/dL — AB (ref 12.0–15.0)
Lymphocytes Relative: 16 %
Lymphs Abs: 1.7 10*3/uL (ref 0.7–4.0)
MCH: 30.4 pg (ref 26.0–34.0)
MCHC: 32.8 g/dL (ref 30.0–36.0)
MCV: 92.7 fL (ref 78.0–100.0)
Monocytes Absolute: 0.6 10*3/uL (ref 0.1–1.0)
Monocytes Relative: 6 %
NEUTROS ABS: 8.4 10*3/uL — AB (ref 1.7–7.7)
NEUTROS PCT: 78 %
Platelets: 262 10*3/uL (ref 150–400)
RBC: 2.73 MIL/uL — AB (ref 3.87–5.11)
RDW: 15.6 % — ABNORMAL HIGH (ref 11.5–15.5)
WBC: 10.7 10*3/uL — AB (ref 4.0–10.5)

## 2017-01-11 LAB — RENAL FUNCTION PANEL
ANION GAP: 15 (ref 5–15)
Albumin: 2.2 g/dL — ABNORMAL LOW (ref 3.5–5.0)
BUN: 157 mg/dL — ABNORMAL HIGH (ref 6–20)
CALCIUM: 8.8 mg/dL — AB (ref 8.9–10.3)
CO2: 21 mmol/L — ABNORMAL LOW (ref 22–32)
CREATININE: 3.98 mg/dL — AB (ref 0.44–1.00)
Chloride: 110 mmol/L (ref 101–111)
GFR, EST AFRICAN AMERICAN: 12 mL/min — AB (ref >60)
GFR, EST NON AFRICAN AMERICAN: 10 mL/min — AB (ref >60)
Glucose, Bld: 148 mg/dL — ABNORMAL HIGH (ref 65–99)
Phosphorus: 5.6 mg/dL — ABNORMAL HIGH (ref 2.5–4.6)
Potassium: 3.6 mmol/L (ref 3.5–5.1)
SODIUM: 146 mmol/L — AB (ref 135–145)

## 2017-01-11 LAB — MAGNESIUM: MAGNESIUM: 3.2 mg/dL — AB (ref 1.7–2.4)

## 2017-01-11 MED ORDER — METOPROLOL TARTRATE 25 MG PO TABS
25.0000 mg | ORAL_TABLET | Freq: Four times a day (QID) | ORAL | Status: DC
Start: 2017-01-11 — End: 2017-01-12
  Administered 2017-01-11 – 2017-01-12 (×4): 25 mg
  Filled 2017-01-11 (×5): qty 1

## 2017-01-11 NOTE — Progress Notes (Addendum)
Progress Note  Patient Name: Morgan Cooper Date of Encounter: 01/11/2017  Primary Cardiologist: Irish Lack  Subjective   Intubated and sedated, though agitated overnight and this morning. Has remained in a-fib with RVR. Receiving tube feeds and enteric medications via OGT.  Inpatient Medications    Scheduled Meds: . atorvastatin  20 mg Per Tube Daily  . azaTHIOprine  50 mg Oral Daily  . chlorhexidine gluconate (MEDLINE KIT)  15 mL Mouth Rinse BID  . Chlorhexidine Gluconate Cloth  6 each Topical Daily  . cycloSPORINE  1 drop Both Eyes BID  . docusate  100 mg Per Tube BID  . heparin subcutaneous  5,000 Units Subcutaneous Q8H  . hydrocortisone sod succinate (SOLU-CORTEF) inj  50 mg Intravenous Q12H  . insulin aspart  0-20 Units Subcutaneous Q4H  . insulin glargine  50 Units Subcutaneous QHS  . mouth rinse  15 mL Mouth Rinse QID  . metoprolol tartrate  2.5 mg Intravenous Q6H  . pantoprazole sodium  40 mg Per Tube Q24H  . QUEtiapine  50 mg Per Tube BID  . sennosides  5 mL Per Tube BID  . sodium bicarbonate  1,300 mg Per Tube TID   Continuous Infusions: . sodium chloride 250 mL (01/10/17 2227)  . dexmedetomidine (PRECEDEX) IV infusion 1.2 mcg/kg/hr (01/11/17 0458)  . diltiazem (CARDIZEM) infusion 15 mg/hr (01/11/17 0511)  . feeding supplement (VITAL AF 1.2 CAL) 1,000 mL (01/11/17 0455)  . fentaNYL infusion INTRAVENOUS 75 mcg/hr (01/10/17 1858)  . piperacillin-tazobactam (ZOSYN)  IV Stopped (01/11/17 0348)   PRN Meds: Place/Maintain arterial line **AND** sodium chloride, fentaNYL, levalbuterol, metoprolol tartrate, midazolam, ondansetron (ZOFRAN) IV   Vital Signs    Vitals:   01/11/17 0500 01/11/17 0600 01/11/17 0732 01/11/17 0816  BP: (!) 153/87 128/74    Pulse: (!) 132 (!) 127  (!) 141  Resp: (!) 26 (!) 26  (!) 26  Temp:   99.3 F (37.4 C)   TempSrc:   Oral   SpO2: 98% 98%  98%  Weight: 134 lb 11.2 oz (61.1 kg)     Height:        Intake/Output Summary (Last 24  hours) at 01/11/2017 0827 Last data filed at 01/11/2017 0600 Gross per 24 hour  Intake 2396.59 ml  Output 2001 ml  Net 395.59 ml   Filed Weights   01/09/17 0500 01/10/17 0800 01/11/17 0500  Weight: 121 lb 14.6 oz (55.3 kg) 130 lb 15.3 oz (59.4 kg) 134 lb 11.2 oz (61.1 kg)    Telemetry    A-fib with RVR (HR 105-150 bpm) - Personally Reviewed  ECG    Now new tracing  Physical Exam   GEN: Intubated. Opens eyes to voice. Neck: No JVD. Right IJ CVC in place. Cardiac: Tachycardic and irregularly irregular without murmurs. Respiratory: Coarse breath sounds anteriorly. ETT in place. GI: Soft, nontender, non-distended  MS: trace pretibial edema bilaterally Neuro:  Opens eyes but does not follow commands. Intubated and sedated  Labs    Chemistry Recent Labs  Lab 01/04/17 [phone]  01/06/17 0508  01/08/17 0359 01/09/17 0455 01/10/17 0438  NA 136   < > 132*   < > 133* 135 139  K 4.1   < > 3.8   < > 4.3 3.5 3.4*  CL 102   < > 100*   < > 101 101 105  CO2 25   < > 21*   < > 20* 18* 21*  GLUCOSE 188*   < > 365*   < >  269* 278* 278*  BUN 11   < > 41*   < > 90* 124* 145*  CREATININE 0.90   < > 2.20*   < > 3.84* 4.29* 4.31*  CALCIUM 8.7*   < > 8.4*   < > 8.4* 8.6* 8.6*  PROT 6.5  --  6.2*  --   --   --  5.3*  ALBUMIN 3.0*  --  2.4*  --   --  2.0* 2.0*  AST 29  --  21  --   --   --  18  ALT 15  --  10*  --   --   --  17  ALKPHOS 59  --  62  --   --   --  52  BILITOT 1.5*  --  1.0  --   --   --  0.8  GFRNONAA >60   < > 21*   < > 11* 9* 9*  GFRAA >60   < > 25*   < > 13* 11* 11*  ANIONGAP 9   < > 11   < > 12 16* 13   < > = values in this interval not displayed.     Hematology Recent Labs  Lab 01/09/17 0455 01/10/17 0439 01/11/17 0500  WBC 6.3 7.1 10.7*  RBC 2.60* 2.45* 2.73*  HGB 8.0* 7.7* 8.3*  HCT 23.6* 22.2* 25.3*  MCV 90.8 90.6 92.7  MCH 30.8 31.4 30.4  MCHC 33.9 34.7 32.8  RDW 15.1 15.2 15.6*  PLT 150 178 262    Cardiac EnzymesNo results for input(s): TROPONINI  in the last 168 hours. No results for input(s): TROPIPOC in the last 168 hours.   BNPNo results for input(s): BNP, PROBNP in the last 168 hours.   DDimer No results for input(s): DDIMER in the last 168 hours.   Radiology    Dg Chest Port 1 View  Result Date: 01/11/2017 CLINICAL DATA:  Hypoxia EXAM: PORTABLE CHEST 1 VIEW COMPARISON:  January 10, 2017 FINDINGS: Endotracheal tube tip is 3.1 cm above the carina. Central catheter tip is at the cavoatrial junction. Nasogastric tube side port is in the stomach with tip below the diaphragm, not seen. No pneumothorax. There is patchy airspace consolidation in the left base with small left pleural effusion. There is underlying fibrosis. Heart is upper normal in size with pulmonary vascularity within normal limits. There is aortic atherosclerosis. No adenopathy. No evident bone lesions. IMPRESSION: Tube and catheter positions as described without pneumothorax. Persistent airspace opacity, likely pneumonia, in the left base with small left effusion. Underlying fibrosis. No new opacity. Stable cardiac silhouette. There is aortic atherosclerosis. Aortic Atherosclerosis (ICD10-I70.0). Electronically Signed   By: Lowella Grip III M.D.   On: 01/11/2017 07:03   Dg Chest Port 1 View  Result Date: 01/10/2017 CLINICAL DATA:  Hypoxia EXAM: PORTABLE CHEST 1 VIEW COMPARISON:  January 08, 2017 chest radiograph and chest CT January 03, 2017 FINDINGS: Endotracheal tube tip is 4.3 cm above the carina. Nasogastric tube tip and side port are below the diaphragm with the side port seen in the stomach. Central catheter tip is in the superior vena cava. No pneumothorax. There is a skin fold on the right. There is fibrosis bilaterally. There is new consolidation in the left base with small left pleural effusion. Heart is upper normal in size with pulmonary vascular within normal limits. There is aortic atherosclerosis. No evident adenopathy. No appreciable bone lesions.  IMPRESSION: Tube and catheter positions as described without  pneumothorax. There is new airspace consolidation lateral left base with small pleural effusion. Suspect focal pneumonia in the left base. There is underlying pulmonary fibrotic change. Cardiac silhouette is stable.  There is aortic atherosclerosis. Aortic Atherosclerosis (ICD10-I70.0). Electronically Signed   By: Lowella Grip III M.D.   On: 01/10/2017 07:23    Cardiac Studies   Echo (01/04/17): - Left ventricle: The cavity size was normal. Systolic function was   normal. The estimated ejection fraction was in the range of 55%   to 60%. - Aortic valve: There was trivial regurgitation. - Left atrium: The atrium was moderately dilated. - Right ventricle: The cavity size was mildly dilated. Wall   thickness was normal. - Right atrium: The atrium was moderately dilated. - Pulmonary arteries: PA peak pressure: 39 mm Hg (S).  Patient Profile     71 y.o. female with new onset atrial fibrillation with rapid ventricular response, possible pneumonia and acute on chronic kidney disease. Hospital course complicated by prolonged intubation/mechanical ventilation with encephalopathy/delirium.  Assessment & Plan    Atrial fibrillation with rapid ventricular response Ventricular rate remains poorly controlled (105-150 bpm). IV metoprolol added yesterday without significant improvement. Diltiazem infusion remains at 15 mg/hr. Hemoglobin stable but still quite low; anticoagulation on hold. Blood pressure is stable.  Continue diltiazem infusion at 15 mg/hr.  Stop IV metoprolol; start metoprolol tartrate 25 mg q6hours via OGT. Can be uptitrated to max 200 mg daily, as blood pressure allows.  Unable to use digoxin in the setting of worsening AKI.  Hold anticoagulation in the setting of significant anemia and occult blood positive stool.  Not candidate for rhythm control strategy due to inability to appropriately anticoagulate at this  time.  Chronic diastolic heart failure Patient slightly net positive. She has trace ankle edema and normal CVP (8 mmHg).  Maintain net even fluid balance.  Anemia Significant drop from 11/5 -> 11/20; seems to have stabilized over last 2 days. Stool was occult blood positive, though nursing does not report any obvious bleeding this AM.  No anticoagulation at this time.  Consider PRBC transfusion, particularly if hemoglobin drops below 8. Will defer to CCM.  Acute kidney injury with history of renal transplant Creatinine continues to rise (last checked yesterday) with good urine output through this AM.  Avoid nephrotoxic drugs.  No diuresis; maintain net even fluid balance.  HCAP and VDRF Antibiotics completed. Unable to wean from vent due to encephalopathy/delirium.  Per CCM  AAA 4.3 cm on recent abdominal CT.  Outpatient follow-up.  For questions or updates, please contact [place] Please consult [email or web address] for contact info under Cardiology/STEMI.      Signed, Nelva Bush, MD  01/11/2017, 8:27 AM

## 2017-01-11 NOTE — Plan of Care (Signed)
Family remains at bedside and updated.  Currently remains on Fentanyl and Precidex to help with anxiety.  Patient able to nod yes/ no appropriately.  Will continue to monitor

## 2017-01-11 NOTE — Progress Notes (Signed)
[facility] Rounding Note Subjective/Interval History:   Excellent unstimulated UOP past 24 hours CVP 8   Objective Vital signs in last 24 hours: Vitals:   01/11/17 0500 01/11/17 0600 01/11/17 0732 01/11/17 0816  BP: (!) 153/87 128/74    Pulse: (!) 132 (!) 127  (!) 141  Resp: (!) 26 (!) 26  (!) 26  Temp:   99.3 F (37.4 C)   TempSrc:   Oral   SpO2: 98% 98%  98%  Weight: 61.1 kg (134 lb 11.2 oz)     Height:       Weight change:   Intake/Output Summary (Last 24 hours) at 01/11/2017 0842 Last data filed at 01/11/2017 0800 Gross per 24 hour  Intake 2604.79 ml  Output 2226 ml  Net 378.79 ml   Physical Exam:  Blood pressure 128/74, pulse (!) 141, temperature 99.3 F (37.4 C), temperature source Oral, resp. rate (!) 26, height 5' 5"  (1.651 m), weight 61.1 kg (134 lb 11.2 oz), SpO2 98 %.  Physical Examination Intubated, hands in mittens Remains intubated ETT, NG, foley CVP 8 Very frail in appearance Opens eyes to voice Sclera anicteric Chest anteriorly coarse BS Cor irreg irreg AF RVR Tachy 150 Abd soft and not distended. + BS Renal allograft left pelvis not tender GU foley in place, clear urine Ext no LE or UE edema   Renal labs not done yet today   Recent Labs  Lab 01/05/17 0245 01/05/17 1150 01/05/17 1656 01/06/17 0508 01/06/17 1557 01/07/17 0511 01/08/17 0359 01/09/17 0455 01/10/17 0438 01/10/17 0439  NA 136  --  134* 132*  --  133* 133* 135 139  --   K 3.6  --  4.1 3.8  --  3.8 4.3 3.5 3.4*  --   CL 103  --  102 100*  --  101 101 101 105  --   CO2 24  --  24 21*  --  21* 20* 18* 21*  --   GLUCOSE 254*  --  295* 365*  --  270* 269* 278* 278*  --   BUN 14  --  28* 41*  --  64* 90* 124* 145*  --   CREATININE 0.95  --  1.67* 2.20*  --  3.14* 3.84* 4.29* 4.31*  --   CALCIUM 8.4*  --  8.8* 8.4*  --  8.4* 8.4* 8.6* 8.6*  --   PHOS 3.5 2.6 2.9 3.9 4.1  --   --  5.6*  --  6.3*    Recent Labs  Lab 01/04/17 0951 01/06/17 0508 01/09/17 0455 01/10/17 0438  AST  29 21  --  18  ALT 15 10*  --  17  ALKPHOS 59 62  --  52  BILITOT 1.5* 1.0  --  0.8  PROT 6.5 6.2*  --  5.3*  ALBUMIN 3.0* 2.4* 2.0* 2.0*    Recent Labs  Lab 01/05/17 0245  01/08/17 0359 01/08/17 1640 01/09/17 0455 01/10/17 0439 01/11/17 0500  WBC 14.2*   < > 6.4  --  6.3 7.1 10.7*  NEUTROABS 12.5*  --   --   --  5.4 6.3 8.4*  HGB 11.1*   < > 8.5* 8.7* 8.0* 7.7* 8.3*  HCT 33.7*   < > 25.8* 26.2* 23.6* 22.2* 25.3*  MCV 94.7   < > 91.2  --  90.8 90.6 92.7  PLT 257   < > 152  --  150 178 262   < > = values in this interval  not displayed.    Recent Labs  Lab 01/10/17 1557 01/10/17 2017 01/11/17 0026 01/11/17 0417 01/11/17 0728  GLUCAP 279* 213* 199* 144* 147*   Studies/Results: Dg Chest Port 1 View  Result Date: 01/11/2017 CLINICAL DATA:  Hypoxia EXAM: PORTABLE CHEST 1 VIEW COMPARISON:  January 10, 2017 FINDINGS: Endotracheal tube tip is 3.1 cm above the carina. Central catheter tip is at the cavoatrial junction. Nasogastric tube side port is in the stomach with tip below the diaphragm, not seen. No pneumothorax. There is patchy airspace consolidation in the left base with small left pleural effusion. There is underlying fibrosis. Heart is upper normal in size with pulmonary vascularity within normal limits. There is aortic atherosclerosis. No adenopathy. No evident bone lesions. IMPRESSION: Tube and catheter positions as described without pneumothorax. Persistent airspace opacity, likely pneumonia, in the left base with small left effusion. Underlying fibrosis. No new opacity. Stable cardiac silhouette. There is aortic atherosclerosis. Aortic Atherosclerosis (ICD10-I70.0). Electronically Signed   By: Lowella Grip III M.D.   On: 01/11/2017 07:03   Dg Chest Port 1 View  Result Date: 01/10/2017 CLINICAL DATA:  Hypoxia EXAM: PORTABLE CHEST 1 VIEW COMPARISON:  January 08, 2017 chest radiograph and chest CT January 03, 2017 FINDINGS: Endotracheal tube tip is 4.3 cm above the  carina. Nasogastric tube tip and side port are below the diaphragm with the side port seen in the stomach. Central catheter tip is in the superior vena cava. No pneumothorax. There is a skin fold on the right. There is fibrosis bilaterally. There is new consolidation in the left base with small left pleural effusion. Heart is upper normal in size with pulmonary vascular within normal limits. There is aortic atherosclerosis. No evident adenopathy. No appreciable bone lesions. IMPRESSION: Tube and catheter positions as described without pneumothorax. There is new airspace consolidation lateral left base with small pleural effusion. Suspect focal pneumonia in the left base. There is underlying pulmonary fibrotic change. Cardiac silhouette is stable.  There is aortic atherosclerosis. Aortic Atherosclerosis (ICD10-I70.0). Electronically Signed   By: Lowella Grip III M.D.   On: 01/10/2017 07:23   Medications: . sodium chloride 250 mL (01/10/17 2227)  . dexmedetomidine (PRECEDEX) IV infusion 1.2 mcg/kg/hr (01/11/17 0458)  . diltiazem (CARDIZEM) infusion 15 mg/hr (01/11/17 0511)  . feeding supplement (VITAL AF 1.2 CAL) 1,000 mL (01/11/17 0455)  . fentaNYL infusion INTRAVENOUS 75 mcg/hr (01/10/17 1858)  . piperacillin-tazobactam (ZOSYN)  IV Stopped (01/11/17 0348)   . atorvastatin  20 mg Per Tube Daily  . azaTHIOprine  50 mg Oral Daily  . chlorhexidine gluconate (MEDLINE KIT)  15 mL Mouth Rinse BID  . Chlorhexidine Gluconate Cloth  6 each Topical Daily  . cycloSPORINE  1 drop Both Eyes BID  . docusate  100 mg Per Tube BID  . heparin subcutaneous  5,000 Units Subcutaneous Q8H  . hydrocortisone sod succinate (SOLU-CORTEF) inj  50 mg Intravenous Q12H  . insulin aspart  0-20 Units Subcutaneous Q4H  . insulin glargine  50 Units Subcutaneous QHS  . mouth rinse  15 mL Mouth Rinse QID  . metoprolol tartrate  2.5 mg Intravenous Q6H  . pantoprazole sodium  40 mg Per Tube Q24H  . QUEtiapine  50 mg Per Tube  BID  . sennosides  5 mL Per Tube BID  . sodium bicarbonate  1,300 mg Per Tube TID  Place/Maintain arterial line **AND** sodium chloride, fentaNYL, levalbuterol, metoprolol tartrate, midazolam, ondansetron (ZOFRAN) IV  Background: 71 y.o. year-old with hx of ESRD sp  renal transplant (LRD, 1984, pred/ imuran) Presented 11/14 w/SOB/ DOE and some mild CP. Recent new AF on BB and Eliquis. Hx of dCHF, not on diuretics PTA. Hypoxic on adm. BNP was 2000, initial Rx IV diltiazem for afib /RVR, not felt to be vol overloaded.  CT angio w/ contrast no PE but RUL air space disease, pulm edema vs infection.  Lactic acid 1.45, WBC 9k. Admitted, rx'd with IV diltiazem, po metoprolol, IV abx (rocephin, vanc/ zosyn) for PNA. Intubated 11/15. Admit creat 0.8, 2.2 on 11/16, 3.14 11/18 w/oliguria. Weight up 50->53 kg. CXR diffuse bilat infiltrates, R> L . Hypotension on levo gtt for about 24 hrs.  BP's in low 100 range.  ECHO showed normal LVEF.  Asked to see for AKI.    Impression: 1. AKI - IV contrast nephropathy, relative hypotension, hemodynamic effects possible sepsis, AF w/RVR. Creatinine at plateau yesterday, waiting on the labs from today, UOP increasing without diuretics.(Last dosed Lasix AM 11/19 80 mg). CVP 8. Waiting on labs but predicting improvement. 2. Renal transplant - Creat normal 0.8 pre CT angio. Home meds imuran (has not received since intubation) and prednisone (getting solucortef). Imuran intentionally on hold because of possible sepsis 3. Hypokalemia - replete as needed 4. VDRF - per CCM 5. Multifocal PNA / hx pulm fibrosis and COPD - new L lateral base consolidation 11/21. Zosyn 6. AF with RVR on IV dilt/metoprolol Rate not controlled. Cardiology changing BB to give per tube. 7. dCHF 8. Anemia - Hb drifting down. Transfuse prn 9. Metabolic acidosis - mild. Na bicarb via tube.    Morgan Maes, MD [facility] [phone] pager 01/11/2017, 8:42 AM

## 2017-01-11 NOTE — Progress Notes (Signed)
RT changed patient's ETT holder without complications. Vitals stable throughout. Patient tolerated well. RN and family at bedside. RT will continue to monitor.

## 2017-01-11 NOTE — Progress Notes (Signed)
[facility] Attending Note  ADMISSION DATE:01/03/2017  CONSULTATION DATE:01/04/17  REFERRING NU:UVOZD Phillip Heal, NP / TRH  CHIEF COMPLAINT:Acute Hypoxic Respiratory Failure   Presenting HPI:  71 y.o. female with prior history of tobacco use. Presented to hospital with acute hypoxic respiratory failure and atrial fibrillation with rapid ventricular response. Concern for possible healthcare associated pneumonia. 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 Patient has a known history of atrial fibrillation, stroke, sleep apnea, macular degeneration, diastolic congestive heart failure, and gastroparesis.  Subjective:  No acute events overnight. Patient somewhat agitated this morning. Now more calm. No bleeding.   Review of Systems:  Unable to obtain given intubation & sedation.   Vent Mode: PCV FiO2 (%):  [30 %] 30 % Set Rate:  [26 bmp] 26 bmp PEEP:  [10 cmH20] 10 cmH20 Plateau Pressure:  [18 GUY40-34 cmH20] 24 cmH20  Temp:  [98.3 F (36.8 C)-99.7 F (37.6 C)] 99.3 F (37.4 C) (11/22 0732) Pulse Rate:  [104-141] 141 (11/22 0816) Resp:  [22-26] 26 (11/22 0816) BP: (103-155)/(63-91) 128/74 (11/22 0600) SpO2:  [96 %-98 %] 98 % (11/22 0816) FiO2 (%):  [30 %] 30 % (11/22 0816) Weight:  [134 lb 11.2 oz (61.1 kg)] 134 lb 11.2 oz (61.1 kg) (11/22 0500)  General:  Daughter at bedside. Intubated. No distress. Integument:  Warm & dry. No rash on exposed skin. HEENT:  Moist mucus memebranes. No scleral icterus. Endotracheal tube in place. Neurological:  Pupils symmetric. No spontaneous movements. Opens eyes to voice. Pulmonary:  Symmetric chest wall rise on ventilator. Clear breath sounds bilaterally. Cardiovascular:  Tachycardic & irregular rhythm. No appreciable JVD. Telemetry:  Atrial fibrillation.  Abdomen:  Soft. Nondistended. Hypoactive bowel  sounds.  LINES/TUBES: OETT 11/16 >>> R IJ CVL 11/16 >>> L RAD ART LINE 11/16 >>> Foley >>> OGT >>> PIV  CBC Latest Ref Rng & Units 01/11/2017 01/10/2017 01/09/2017  WBC 4.0 - 10.5 K/uL 10.7(H) 7.1 6.3  Hemoglobin 12.0 - 15.0 g/dL 8.3(L) 7.7(L) 8.0(L)  Hematocrit 36.0 - 46.0 % 25.3(L) 22.2(L) 23.6(L)  Platelets 150 - 400 K/uL 262 178 150   BMP Latest Ref Rng & Units 01/11/2017 01/10/2017 01/09/2017  Glucose 65 - 99 mg/dL 148(H) 278(H) 278(H)  BUN 6 - 20 mg/dL 157(H) 145(H) 124(H)  Creatinine 0.44 - 1.00 mg/dL 3.98(H) 4.31(H) 4.29(H)  Sodium 135 - 145 mmol/L 146(H) 139 135  Potassium 3.5 - 5.1 mmol/L 3.6 3.4(L) 3.5  Chloride 101 - 111 mmol/L 110 105 101  CO2 22 - 32 mmol/L 21(L) 21(L) 18(L)  Calcium 8.9 - 10.3 mg/dL 8.8(L) 8.6(L) 8.6(L)   Hepatic Function Latest Ref Rng & Units 01/11/2017 01/10/2017 01/09/2017  Total Protein 6.5 - 8.1 g/dL - 5.3(L) -  Albumin 3.5 - 5.0 g/dL 2.2(L) 2.0(L) 2.0(L)  AST 15 - 41 U/L - 18 -  ALT 14 - 54 U/L - 17 -  Alk Phosphatase 38 - 126 U/L - 52 -  Total Bilirubin 0.3 - 1.2 mg/dL - 0.8 -  Bilirubin, Direct <=0.2 mg/dL - - -    IMAGING/STUDIES: CTA HEST 11/14 IMPRESSION: 1. No evidence acute pulmonary embolism. 2. Diffuse airspace disease in the RIGHT upper lobe superimposed on centrilobular emphysema. Etiology includes  Pulmonary edema versus pneumonia. 3. Coronary artery calcification 4.  Aortic Atherosclerosis (ICD10-I70.0)  5.  Emphysema (ICD10-J43.9). TTE 11/15:  LV normal in size w/ EF 55-60%. LA moderately dilated & RA moderately  dilated. RV mildly dilated with preserved systolic function. Trivial aortic regurgitation. Trivial mitral regurgitation without stenosis. No pulmonic regurgitation. Mild tricuspid regurgitation. No pericardial effusion. CT ABD/PELVIS W/O 11/19: IMPRESSION: 1. Study markedly degraded by patient motion and limited by lack of intravenous contrast material. 2. Gallbladder is distended with layering sludge or  tiny stones. There appears to be pericholecystic fluid although assessment is hindered by the motion artifact. Abdominal ultrasound may prove helpful to further evaluate. 3. 5 x 3 cm apparent fluid collection in the distal pancreatectomy bed at the site of the previous drainage catheter. This is not well evaluated given the above limitations, but seroma, hematoma, or abscess could have this appearance. 4. Lung bases appear better aerated than CT scan of 01/03/2017 although there is persistent bilateral lower lung  ground-glass attenuation potentially rated to pulmonary edema or diffuse infection. 5. New focal opacity measuring 2 cm in the left base. Given that this was not present on the study from 5 days ago, it  probably represents atelectasis or infection. 6. Similar appearance 4.3 cm abdominal aortic aneurysm. 7. No overt hydronephrosis and left pelvic transplant kidney. Gas in the bladder lumen presumably related to the  presence of a Foley catheter. 8. Small volume intraperitoneal free fluid with diffuse body wall edema. PORT CXR 11/19:  Previously reviewed by me. Endotracheal tube in good position. Enteric feeding tube coursing below the diaphragm. No new focal opacity appreciated. PORT CXR 11/21:  Previously reviewed by me. Significant rotation of the chest film to the left. Questionable silhouetting of extreme lateral left hemidiaphragm. No new focal opacity appreciated. Endotracheal tube in good position. PORT CXR 11/22:  Personally reviewed by me. Endotracheal tube in good position. Enteric feeding tube coursing below diaphragm. Film rotated to the left. No obvious new focal opacity. Silhouetting of left hemidiaphragm could be due to atelectasis or evolving infiltrate.  MICROBIOLOGY: MRSA PCR 11/14:  Negative Blood Cultures x2 11/14:  Negative Tracheal Aspirate Culture 11/16:  Normal Respiratory Flora  Respiratory Panel PCR 11/15:  Negative  Influenza A/B PCR  11/14:  Negative  Urine  Streptococcal Antigen 11/14:  Negative  Urine Legionella Antigen 11/14:  Negative   ANTIBIOTICS: Azithromycin 11/14 - 11/15 Rocephin 11/14 - 11/15 Vancomycin 11/14 - 11/18 Zosyn 11/15 >>>  SIGNIFICANT EVENTS: 11/14 - Admit 11/16 - Transfer to ICU with worsening respiratory status 11/18 - Changed to pressure control ventilation  11/20 - Didn't tolerate attempt to switch back from Pressure Control to PRVC. Heparin drip stopped w/ anemia.  11/21 - Started Seroquel 38m BID.   ASSESSMENT/PLAN:  71y.o. female with chronic immunosuppression secondary to renal transplant. Acute on chronic hypoxic respiratory failure. Agitation/delirium is a significant barrier to extubation.  1. Acute on chronic hypoxic respiratory failure: Continuing to pressure support weaning & swab his breathing trials as mental status allows. Continuous pulse oximetry monitoring. 2. Healthcare associated pneumonia: Status post treatment with 7 days of Zosyn for presumed pneumonia. Plan reculture for fever. 3. Acute encephalopathy/delirium: Continuing Seroquel 50 mg via tube twice a day. Monitoring EKG daily. Attempting to further wean sedation. 4. Atrial fibrillation: Management per cardiology. Continuous telemetry monitoring. Holding heparin drip given anemia. Continuing Lopressor via tube every 6 hours & diltiazem drip. 5. Acute renal failure: Improving. Urine outputs remained stable. Avoiding nephrotoxic agents. Restarted Imuran for renal transplant. Slow tapering of Solu-Cortef. 6. Azotemia: Likely multifactorial from steroids as well as acute renal failure. 7. Metabolic acidosis: Mild. Trending serum bicarbonate daily. Continuing bicarbonate via tube 3 times  a day. 8. Adrenal insufficiency/chronic prednisone therapy: Continuing Solu-Cortef 50 mg IV every 12 hours. Consider further tapering in the next 24-48 hours. 9. Hyperglycemia: Likely secondary to steroid therapy. Glucose controlled. Continuing Lantus 50 units  subcutaneous daily at bedtime & sliding scale insulin per resistant algorithm with Accu-Cheks every 4 hours. 10. Anemia: No signs of active bleeding. Hemoglobin stable. Holding systemic anticoagulation. Trending cell counts daily CBC. 11. Acute on chronic diastolic congestive heart failure: Holding on diuresis given acute renal failure. Continuing Lipitor. 12. Severe protein-calorie malnutrition: Continuing tube feeds per dietary recommendations. 13. Hypernatremia: Mild. Monitoring electrolytes daily. 14. COPD: No signs of acute exacerbation. Continuing Duonebs every 6 hours. 15. ILD: Reportedly secondary to [place]der. No signs of acute flare.  Prophylaxis:  Heparin subcutaneous every 8 hours, SCDs & Protonix via tube. Diet:  NPO. Tube feedings per dietary recommendations. Code Status:  Full Code. Disposition:  Remains critically ill in the ICU. Family Update:  Daughters updated during rounds today.  I have personally spent a total of 31 minutes of critical care time today caring for the patient & reviewing the patient's electronic medical record.  Sonia Baller Ashok Cordia, M.D. [facility] Pager:  [phone] After 7pm or if no response, call [phone] 9:11 AM 01/11/17

## 2017-01-11 NOTE — Plan of Care (Signed)
Resumed home seroquel, improvement in anxiety/agitation

## 2017-01-12 LAB — MAGNESIUM
Magnesium: 3 mg/dL — ABNORMAL HIGH (ref 1.7–2.4)
Magnesium: 3 mg/dL — ABNORMAL HIGH (ref 1.7–2.4)

## 2017-01-12 LAB — RENAL FUNCTION PANEL
ALBUMIN: 2 g/dL — AB (ref 3.5–5.0)
ALBUMIN: 2 g/dL — AB (ref 3.5–5.0)
ANION GAP: 11 (ref 5–15)
ANION GAP: 9 (ref 5–15)
BUN: 159 mg/dL — ABNORMAL HIGH (ref 6–20)
BUN: 152 mg/dL — AB (ref 6–20)
CALCIUM: 8.8 mg/dL — AB (ref 8.9–10.3)
CO2: 25 mmol/L (ref 22–32)
CO2: 26 mmol/L (ref 22–32)
Calcium: 8.5 mg/dL — ABNORMAL LOW (ref 8.9–10.3)
Chloride: 117 mmol/L — ABNORMAL HIGH (ref 101–111)
Chloride: 120 mmol/L — ABNORMAL HIGH (ref 101–111)
Creatinine, Ser: 3.6 mg/dL — ABNORMAL HIGH (ref 0.44–1.00)
Creatinine, Ser: 3.37 mg/dL — ABNORMAL HIGH (ref 0.44–1.00)
GFR calc Af Amer: 14 mL/min — ABNORMAL LOW (ref >60)
GFR calc Af Amer: 15 mL/min — ABNORMAL LOW (ref >60)
GFR calc non Af Amer: 12 mL/min — ABNORMAL LOW (ref >60)
GFR calc non Af Amer: 13 mL/min — ABNORMAL LOW (ref >60)
GLUCOSE: 165 mg/dL — AB (ref 65–99)
Glucose, Bld: 154 mg/dL — ABNORMAL HIGH (ref 65–99)
PHOSPHORUS: 4.8 mg/dL — AB (ref 2.5–4.6)
POTASSIUM: 3 mmol/L — AB (ref 3.5–5.1)
Phosphorus: 4.7 mg/dL — ABNORMAL HIGH (ref 2.5–4.6)
Potassium: 2.9 mmol/L — ABNORMAL LOW (ref 3.5–5.1)
SODIUM: 153 mmol/L — AB (ref 135–145)
SODIUM: 155 mmol/L — AB (ref 135–145)

## 2017-01-12 LAB — CBC WITH DIFFERENTIAL/PLATELET
BASOS ABS: 0 10*3/uL (ref 0.0–0.1)
BASOS PCT: 0 %
Eosinophils Absolute: 0 10*3/uL (ref 0.0–0.7)
Eosinophils Relative: 0 %
HEMATOCRIT: 23.7 % — AB (ref 36.0–46.0)
HEMOGLOBIN: 7.8 g/dL — AB (ref 12.0–15.0)
Lymphocytes Relative: 15 %
Lymphs Abs: 1.4 10*3/uL (ref 0.7–4.0)
MCH: 30.5 pg (ref 26.0–34.0)
MCHC: 32.9 g/dL (ref 30.0–36.0)
MCV: 92.6 fL (ref 78.0–100.0)
MONO ABS: 0.4 10*3/uL (ref 0.1–1.0)
Monocytes Relative: 4 %
NEUTROS ABS: 7.5 10*3/uL (ref 1.7–7.7)
NEUTROS PCT: 81 %
Platelets: 247 10*3/uL (ref 150–400)
RBC: 2.56 MIL/uL — AB (ref 3.87–5.11)
RDW: 15.9 % — AB (ref 11.5–15.5)
WBC: 9.3 10*3/uL (ref 4.0–10.5)

## 2017-01-12 LAB — CBC
HEMATOCRIT: 24.2 % — AB (ref 36.0–46.0)
Hemoglobin: 7.9 g/dL — ABNORMAL LOW (ref 12.0–15.0)
MCH: 30 pg (ref 26.0–34.0)
MCHC: 32.6 g/dL (ref 30.0–36.0)
MCV: 92 fL (ref 78.0–100.0)
PLATELETS: 254 10*3/uL (ref 150–400)
RBC: 2.63 MIL/uL — ABNORMAL LOW (ref 3.87–5.11)
RDW: 15.6 % — AB (ref 11.5–15.5)
WBC: 10.9 10*3/uL — AB (ref 4.0–10.5)

## 2017-01-12 LAB — IRON AND TIBC
IRON: 51 ug/dL (ref 28–170)
SATURATION RATIOS: 19 % (ref 10.4–31.8)
TIBC: 262 ug/dL (ref 250–450)
UIBC: 211 ug/dL

## 2017-01-12 LAB — GLUCOSE, CAPILLARY
GLUCOSE-CAPILLARY: 151 mg/dL — AB (ref 65–99)
GLUCOSE-CAPILLARY: 125 mg/dL — AB (ref 65–99)
Glucose-Capillary: 141 mg/dL — ABNORMAL HIGH (ref 65–99)
Glucose-Capillary: 161 mg/dL — ABNORMAL HIGH (ref 65–99)

## 2017-01-12 MED ORDER — FREE WATER
300.0000 mL | Freq: Four times a day (QID) | Status: DC
  Administered 2017-01-12 – 2017-01-14 (×9): 300 mL

## 2017-01-12 MED ORDER — POTASSIUM CHLORIDE 20 MEQ/15ML (10%) PO SOLN
40.0000 meq | Freq: Three times a day (TID) | ORAL | Status: DC
  Administered 2017-01-12: 40 meq
  Filled 2017-01-12: qty 30

## 2017-01-12 MED ORDER — METOPROLOL TARTRATE 50 MG PO TABS
50.0000 mg | ORAL_TABLET | Freq: Four times a day (QID) | ORAL | Status: DC
  Administered 2017-01-12 – 2017-01-17 (×18): 50 mg
  Filled 2017-01-12 (×2): qty 1
  Filled 2017-01-12: qty 4
  Filled 2017-01-12 (×13): qty 1
  Filled 2017-01-12: qty 4
  Filled 2017-01-12 (×6): qty 1

## 2017-01-12 MED ORDER — PANTOPRAZOLE SODIUM 40 MG IV SOLR
40.0000 mg | Freq: Two times a day (BID) | INTRAVENOUS | Status: DC
  Administered 2017-01-12 – 2017-01-17 (×11): 40 mg via INTRAVENOUS
  Filled 2017-01-12 (×12): qty 40

## 2017-01-12 MED ORDER — INSULIN GLARGINE 100 UNIT/ML ~~LOC~~ SOLN
40.0000 [IU] | Freq: Every day | SUBCUTANEOUS | Status: DC
  Administered 2017-01-13: 40 [IU] via SUBCUTANEOUS
  Filled 2017-01-12: qty 0.4

## 2017-01-12 MED ORDER — SODIUM BICARBONATE 650 MG PO TABS
1300.0000 mg | ORAL_TABLET | Freq: Two times a day (BID) | ORAL | Status: DC
  Administered 2017-01-12 – 2017-01-13 (×4): 1300 mg
  Filled 2017-01-12 (×5): qty 2

## 2017-01-12 NOTE — Progress Notes (Signed)
Pt placed on PS 5/5 by MD, pt tolerating well, RT will monitor

## 2017-01-12 NOTE — Progress Notes (Signed)
[facility] Attending Note  ADMISSION DATE:01/03/2017  CONSULTATION DATE:01/04/17  REFERRING QQ:VZDGL Phillip Heal, NP / TRH  CHIEF COMPLAINT:Acute Hypoxic Respiratory Failure   Presenting HPI:  71 y.o. female with prior history of tobacco use. Presented to hospital with acute hypoxic respiratory failure and atrial fibrillation with rapid ventricular response. Concern for possible healthcare associated pneumonia. 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 Also history of COPD with emphysema & ILD presumably from [place]. Patient has a known history of atrial fibrillation, stroke, sleep apnea, macular degeneration, diastolic congestive heart failure, and gastroparesis.  Subjective:  No acute events overnight. Patient more awake today. Intermittently following commands.   Review of Systems:  Unable to obtain given intubation & sedation.   Vent Mode: PCV FiO2 (%):  [30 %] 30 % Set Rate:  [26 bmp] 26 bmp PEEP:  [8 cmH20] 8 cmH20 Plateau Pressure:  [21 cmH20-23 cmH20] 22 cmH20  Temp:  [98 F (36.7 C)-100.4 F (38 C)] 98 F (36.7 C) (11/23 1130) Pulse Rate:  [109-134] 118 (11/23 1124) Resp:  [22-27] 26 (11/23 1124) BP: (90-147)/(68-101) 90/69 (11/23 1124) SpO2:  [96 %-100 %] 96 % (11/23 1124) FiO2 (%):  [30 %] 30 % (11/23 1124) Weight:  [135 lb 2.3 oz (61.3 kg)] 135 lb 2.3 oz (61.3 kg) (11/23 0500)  General: Daughter again at bedside. No distress. Integument: Warm. Dry. No appreciated rash. HEENT: Moist with his memories. No scleral icterus. Endotracheal tube in place. Cardiovascular: Slightly tachycardic. Irregular rhythm. No JVD appreciated. Abdomen: Soft. Nondistended. Normal bowel sounds. Pulmonary: Normal work of breathing on pressure support 5/5. Penicillin greater than 800 mL. Clear bilaterally with auscultation. Neurological: Spontaneously moving all 4 extremities. Does not consistently follow commands or  attend to voice.  LINES/TUBES: OETT 11/16 >>> R IJ CVL 11/16 >>> L RAD ART LINE 11/16 >>> Foley >>> OGT >>> PIV  CBC Latest Ref Rng & Units 01/12/2017 01/12/2017 01/11/2017  WBC 4.0 - 10.5 K/uL 10.9(H) 9.3 10.7(H)  Hemoglobin 12.0 - 15.0 g/dL 7.9(L) 7.8(L) 8.3(L)  Hematocrit 36.0 - 46.0 % 24.2(L) 23.7(L) 25.3(L)  Platelets 150 - 400 K/uL 254 247 262   BMP Latest Ref Rng & Units 01/12/2017 01/11/2017 01/10/2017  Glucose 65 - 99 mg/dL 154(H) 148(H) 278(H)  BUN 6 - 20 mg/dL 159(H) 157(H) 145(H)  Creatinine 0.44 - 1.00 mg/dL 3.60(H) 3.98(H) 4.31(H)  Sodium 135 - 145 mmol/L 153(H) 146(H) 139  Potassium 3.5 - 5.1 mmol/L 2.9(L) 3.6 3.4(L)  Chloride 101 - 111 mmol/L 117(H) 110 105  CO2 22 - 32 mmol/L 25 21(L) 21(L)  Calcium 8.9 - 10.3 mg/dL 8.8(L) 8.8(L) 8.6(L)   Hepatic Function Latest Ref Rng & Units 01/12/2017 01/11/2017 01/10/2017  Total Protein 6.5 - 8.1 g/dL - - 5.3(L)  Albumin 3.5 - 5.0 g/dL 2.0(L) 2.2(L) 2.0(L)  AST 15 - 41 U/L - - 18  ALT 14 - 54 U/L - - 17  Alk Phosphatase 38 - 126 U/L - - 52  Total Bilirubin 0.3 - 1.2 mg/dL - - 0.8  Bilirubin, Direct <=0.2 mg/dL - - -    IMAGING/STUDIES: CTA HEST 11/14 IMPRESSION: 1. No evidence acute pulmonary embolism. 2. Diffuse airspace disease in the RIGHT upper lobe superimposed on centrilobular emphysema. Etiology includes  Pulmonary edema versus pneumonia. 3. Coronary artery calcification 4.  Aortic Atherosclerosis (ICD10-I70.0)  5.  Emphysema (ICD10-J43.9). TTE 11/15:  LV normal in size w/ EF 55-60%. LA moderately dilated & RA moderately dilated. RV mildly dilated with  preserved systolic function. Trivial aortic regurgitation. Trivial mitral regurgitation without stenosis. No pulmonic regurgitation. Mild tricuspid regurgitation. No pericardial effusion. CT ABD/PELVIS W/O 11/19: IMPRESSION: 1. Study markedly degraded by patient motion and limited by lack of intravenous contrast material. 2. Gallbladder is distended with  layering sludge or tiny stones. There appears to be pericholecystic fluid although assessment is hindered by the motion artifact. Abdominal ultrasound may prove helpful to further evaluate. 3. 5 x 3 cm apparent fluid collection in the distal pancreatectomy bed at the site of the previous drainage catheter. This is not well evaluated given the above limitations, but seroma, hematoma, or abscess could have this appearance. 4. Lung bases appear better aerated than CT scan of 01/03/2017 although there is persistent bilateral lower lung  ground-glass attenuation potentially rated to pulmonary edema or diffuse infection. 5. New focal opacity measuring 2 cm in the left base. Given that this was not present on the study from 5 days ago, it  probably represents atelectasis or infection. 6. Similar appearance 4.3 cm abdominal aortic aneurysm. 7. No overt hydronephrosis and left pelvic transplant kidney. Gas in the bladder lumen presumably related to the  presence of a Foley catheter. 8. Small volume intraperitoneal free fluid with diffuse body wall edema. PORT CXR 11/19:  Previously reviewed by me. Endotracheal tube in good position. Enteric feeding tube coursing below the diaphragm. No new focal opacity appreciated. PORT CXR 11/21:  Previously reviewed by me. Significant rotation of the chest film to the left. Questionable silhouetting of extreme lateral left hemidiaphragm. No new focal opacity appreciated. Endotracheal tube in good position. PORT CXR 11/22:  Previously reviewed by me. Endotracheal tube in good position. Enteric feeding tube coursing below diaphragm. Film rotated to the left. No obvious new focal opacity. Silhouetting of left hemidiaphragm could be due to atelectasis or evolving infiltrate.  MICROBIOLOGY: MRSA PCR 11/14:  Negative Blood Cultures x2 11/14:  Negative Tracheal Aspirate Culture 11/16:  Normal Respiratory Fl[place]  Respiratory Panel PCR 11/15:  Negative  Influenza A/B PCR  11/14:   Negative  Urine Streptococcal Antigen 11/14:  Negative  Urine Legionella Antigen 11/14:  Negative   ANTIBIOTICS: Azithromycin 11/14 - 11/15 Rocephin 11/14 - 11/15 Vancomycin 11/14 - 11/18 Zosyn 11/15 - 11/22  SIGNIFICANT EVENTS: 11/14 - Admit 11/16 - Transfer to ICU with worsening respiratory status 11/18 - Changed to pressure control ventilation  11/20 - Didn't tolerate attempt to switch back from Pressure Control to PRVC. Heparin drip stopped w/ anemia.  11/21 - Started Seroquel 63m BID.  11/23 - More calm and awake today. Tolerating PS 5/5 today w/ TV >800cc.   ASSESSMENT/PLAN:  71y.o. female with chronic immunosuppression secondary to renal transplant. Acute on chronic hypoxic respiratory failure secondary to aspiration. Delirium is improving. Tolerating pressure support wean today.   1. Acute on chronic hypoxic respiratory failure: Continuing daily pressure support wean and spontaneous breathing trial. Continuous pulse oximetry monitoring. 2. Healthcare associated pneumonia/aspiration pneumonia: Status post treatment with Zosyn. Plan to reculture for fever. 3. Acute encephalopathy/delirium: Continuing Seroquel 50 mg twice a day. Repeat EKG today and monitoring QTC daily with EKG. Continuing to avoid sedatives. 4. Atrial fibrillation: Management per cardiology. Lopressor increased. Continuing diltiazem drip. Holding heparin drip given melena & anemia. 5. Acute renal failure: Improving. Monitor urine output with Foley catheter. Nephrology following. Trending electrolytes daily.  6. Hypokalemia: Replaced with KCl via the tube. Monitor electrolytes closely.  7. Hypernatremia: Place on free water via the tube. Monitor electrolytes every 12 hours.  8. Azotemia: Multifactorial from steroids and renal failure. Question some element of upper GI bleeding.  9. Adrenal insufficiency/chronic prednisone therapy: Continuing Solu-Cortef 50 mg IV every 12 hours. Consider further tapering  tomorrow.  10. Hyperglycemia: No history of diabetes mellitus. Secondary to steroid therapy. Decreasing Lantus to 40 units subcutaneous daily at bedtime. Continuing sliding scale insulin with Accu-Cheks every 4 hours and resistant algorithm.  11. Anemia: Question GI bleeding from melena. Fecal occult blood test positive. Hemoglobin stable. Holding systemic anticoagulation. Trending cell counts daily with CBC. Starting Protonix IV every 12 hours.  12. Acute on chronic diastolic congestive heart failure: Holding on diuresis given acute renal failure. Continuing Lipitor. 13. Severe protein-calorie malnutrition: Continuing tube feeds per dietary recommendations. 14. COPD: No signs of acute exacerbation. Continuing Duonebs every 6 hours. 15. ILD: Reportedly secondary to [place]. No signs of acute flare.  Prophylaxis:  Heparin subcutaneous every 8 hours & SCDs. Starting Protonix IV q12hr.  Diet:  NPO. Tube feedings per dietary recommendations. Code Status:  Full Code. Disposition:  Remains critically ill in the ICU. Family Update:  Daughter updated during rounds today.  I have personally spent a total of 36 minutes of critical care time today caring for the patient & reviewing the patient's electronic medical record.  Sonia Baller Ashok Cordia, M.D. [facility] Pager:  [phone] After 7pm or if no response, call [phone] 11:51 AM 01/12/17

## 2017-01-12 NOTE — Progress Notes (Signed)
[facility] Rounding Note Subjective/Interval History:   Making urine, creatinine starting to fall as of yesterday New hypernatremia HR a little slower   Objective Vital signs in last 24 hours: Vitals:   01/12/17 0700 01/12/17 0730 01/12/17 0800 01/12/17 0805  BP: 139/90  (!) 138/92 (!) 138/92  Pulse: (!) 120  (!) 123 (!) 115  Resp: (!) 26  (!) 27 (!) 26  Temp:  98.9 F (37.2 C)    TempSrc:  Oral    SpO2: 98%  96% 96%  Weight:      Height:       Weight change: 1.9 kg (4 lb 3 oz)  Intake/Output Summary (Last 24 hours) at 01/12/2017 0814 Last data filed at 01/12/2017 0800 Gross per 24 hour  Intake 2603.46 ml  Output 2085 ml  Net 518.46 ml   Physical Exam:  Blood pressure (!) 138/92, pulse (!) 115, temperature 98.9 F (37.2 C), temperature source Oral, resp. rate (!) 26, height 5' 5"  (1.651 m), weight 61.3 kg (135 lb 2.3 oz), SpO2 96 %.  Physical Examination Intubated, hands in mittens Awake, restless, looks at me but won't follow commands ETT, NG, foley CVP 8 Very frail in appearance Sclera anicteric Chest anteriorly coarse BS Cor irreg irreg AF RVR Tachy 150 Abd soft and not distended. + BS Renal allograft left pelvis not tender GU foley in place, clear urine Ext no LE or UE edema   Recent Labs  Lab 01/05/17 1656 01/06/17 0508 01/06/17 1557 01/07/17 0511 01/08/17 0359 01/09/17 0455 01/10/17 0438 01/10/17 0439 01/11/17 0500 01/12/17 0508  NA 134* 132*  --  133* 133* 135 139  --  146* 153*  K 4.1 3.8  --  3.8 4.3 3.5 3.4*  --  3.6 2.9*  CL 102 100*  --  101 101 101 105  --  110 117*  CO2 24 21*  --  21* 20* 18* 21*  --  21* 25  GLUCOSE 295* 365*  --  270* 269* 278* 278*  --  148* 154*  BUN 28* 41*  --  64* 90* 124* 145*  --  157* 159*  CREATININE 1.67* 2.20*  --  3.14* 3.84* 4.29* 4.31*  --  3.98* 3.60*  CALCIUM 8.8* 8.4*  --  8.4* 8.4* 8.6* 8.6*  --  8.8* 8.8*  PHOS 2.9 3.9 4.1  --   --  5.6*  --  6.3* 5.6* 4.8*    Recent Labs  Lab 01/06/17 0508   01/10/17 0438 01/11/17 0500 01/12/17 0508  AST 21  --  18  --   --   ALT 10*  --  17  --   --   ALKPHOS 62  --  52  --   --   BILITOT 1.0  --  0.8  --   --   PROT 6.2*  --  5.3*  --   --   ALBUMIN 2.4*   < > 2.0* 2.2* 2.0*   < > = values in this interval not displayed.    Recent Labs  Lab 01/09/17 0455 01/10/17 0439 01/11/17 0500 01/12/17 0508 01/12/17 0700  WBC 6.3 7.1 10.7* 9.3 10.9*  NEUTROABS 5.4 6.3 8.4* 7.5  --   HGB 8.0* 7.7* 8.3* 7.8* 7.9*  HCT 23.6* 22.2* 25.3* 23.7* 24.2*  MCV 90.8 90.6 92.7 92.6 92.0  PLT 150 178 262 247 254    Recent Labs  Lab 01/11/17 1131 01/11/17 1454 01/11/17 2035 01/11/17 2346 01/12/17 0442  GLUCAP 156*  147* 161* 149* 141*   Studies/Results: Dg Chest Port 1 View  Result Date: 01/11/2017 CLINICAL DATA:  Hypoxia EXAM: PORTABLE CHEST 1 VIEW COMPARISON:  January 10, 2017 FINDINGS: Endotracheal tube tip is 3.1 cm above the carina. Central catheter tip is at the cavoatrial junction. Nasogastric tube side port is in the stomach with tip below the diaphragm, not seen. No pneumothorax. There is patchy airspace consolidation in the left base with small left pleural effusion. There is underlying fibrosis. Heart is upper normal in size with pulmonary vascularity within normal limits. There is aortic atherosclerosis. No adenopathy. No evident bone lesions. IMPRESSION: Tube and catheter positions as described without pneumothorax. Persistent airspace opacity, likely pneumonia, in the left base with small left effusion. Underlying fibrosis. No new opacity. Stable cardiac silhouette. There is aortic atherosclerosis. Aortic Atherosclerosis (ICD10-I70.0). Electronically Signed   By: Lowella Grip III M.D.   On: 01/11/2017 07:03   Medications: . sodium chloride 10 mL/hr at 01/12/17 0700  . dexmedetomidine (PRECEDEX) IV infusion 1.2 mcg/kg/hr (01/12/17 0700)  . diltiazem (CARDIZEM) infusion 15 mg/hr (01/12/17 0700)  . feeding supplement (VITAL AF 1.2  CAL) 1,000 mL (01/12/17 0700)  . fentaNYL infusion INTRAVENOUS 25 mcg/hr (01/12/17 0700)   . atorvastatin  20 mg Per Tube Daily  . azaTHIOprine  50 mg Oral Daily  . chlorhexidine gluconate (MEDLINE KIT)  15 mL Mouth Rinse BID  . Chlorhexidine Gluconate Cloth  6 each Topical Daily  . cycloSPORINE  1 drop Both Eyes BID  . docusate  100 mg Per Tube BID  . heparin subcutaneous  5,000 Units Subcutaneous Q8H  . hydrocortisone sod succinate (SOLU-CORTEF) inj  50 mg Intravenous Q12H  . insulin aspart  0-20 Units Subcutaneous Q4H  . insulin glargine  50 Units Subcutaneous QHS  . mouth rinse  15 mL Mouth Rinse QID  . metoprolol tartrate  25 mg Per Tube Q6H  . pantoprazole sodium  40 mg Per Tube Q24H  . QUEtiapine  50 mg Per Tube BID  . sennosides  5 mL Per Tube BID  . sodium bicarbonate  1,300 mg Per Tube TID  Place/Maintain arterial line **AND** sodium chloride, fentaNYL, levalbuterol, midazolam, ondansetron (ZOFRAN) IV  Background: 71 y.o. year-old with hx of ESRD sp renal transplant (LRD, 1984, pred/ imuran) Presented 11/14 w/SOB/ DOE and some mild CP. Recent new AF on BB and Eliquis. Hx of dCHF, not on diuretics PTA. Hypoxic on adm. BNP was 2000, initial Rx IV diltiazem for afib /RVR, not felt to be vol overloaded.  CT angio w/ contrast no PE but RUL air space disease, pulm edema vs infection.  Lactic acid 1.45, WBC 9k. Admitted, rx'd with IV diltiazem, po metoprolol, IV abx (rocephin, vanc/ zosyn) for PNA. Intubated 11/15. Admit creat 0.8, 2.2 on 11/16, 3.14 11/18 w/oliguria. Weight up 50->53 kg. CXR diffuse bilat infiltrates, R> L . Hypotension on levo gtt for about 24 hrs.  BP's in low 100 range.  ECHO showed normal LVEF.  Asked to see for AKI.    Impression: 1. AKI - IV contrast nephropathy, relative hypotension, hemodynamic effects of possible sepsis, AF w/RVR.  1. Creatinine has started to improve past 2 days 2. UOP fine without diuretics.(Last dosed Lasix AM 11/19 80 mg).   2. Hypernatremia - Na up to 153. Looks dry. Add free water via tube 300 Q6H for now 3. Renal transplant - Creat normal 0.8 pre CT angio.  1. Peak creatinine 4.3 Anticipate further recovery.  2. Home meds imuran (  resumed at 72 yesterday by Dr. Ashok Cordia) and prednisone (getting solucortef and reducing). 4. Hypokalemia - replete via tube today 40 TID X 3 doses 5. VDRF - per CCM 6. Multifocal PNA / hx pulm fibrosis and COPD  - S/p 7 days Zosyn 7. AF with RVR on IV dilt/metoprolol Rate not controlled but better. Cardiology managing 8. dCHF 9. Anemia - Hb drifting down. Transfuse prn. Check iron status now that off IV ATB's and if low replete IV 10. Metabolic acidosis - mild. Na bicarb via tube. Improved. Reduce to BID  11. Encephalopathy/agitation - CCM manipulating meds  Jamal Maes, MD [facility] [phone] pager 01/12/2017, 8:14 AM

## 2017-01-12 NOTE — Progress Notes (Signed)
Progress Note  Patient Name: Morgan Cooper Date of Encounter: 01/12/2017  Primary Cardiologist: Dr Irish Lack  Subjective   Intubated; opens eyes to questions  Inpatient Medications    Scheduled Meds: . atorvastatin  20 mg Per Tube Daily  . azaTHIOprine  50 mg Oral Daily  . chlorhexidine gluconate (MEDLINE KIT)  15 mL Mouth Rinse BID  . Chlorhexidine Gluconate Cloth  6 each Topical Daily  . cycloSPORINE  1 drop Both Eyes BID  . docusate  100 mg Per Tube BID  . free water  300 mL Per Tube Q6H  . heparin subcutaneous  5,000 Units Subcutaneous Q8H  . hydrocortisone sod succinate (SOLU-CORTEF) inj  50 mg Intravenous Q12H  . insulin aspart  0-20 Units Subcutaneous Q4H  . insulin glargine  50 Units Subcutaneous QHS  . mouth rinse  15 mL Mouth Rinse QID  . metoprolol tartrate  25 mg Per Tube Q6H  . pantoprazole sodium  40 mg Per Tube Q24H  . potassium chloride  40 mEq Per Tube TID  . QUEtiapine  50 mg Per Tube BID  . sennosides  5 mL Per Tube BID  . sodium bicarbonate  1,300 mg Per Tube BID   Continuous Infusions: . sodium chloride 10 mL/hr at 01/12/17 0700  . dexmedetomidine (PRECEDEX) IV infusion 1.2 mcg/kg/hr (01/12/17 0700)  . diltiazem (CARDIZEM) infusion 15 mg/hr (01/12/17 0700)  . feeding supplement (VITAL AF 1.2 CAL) 1,000 mL (01/12/17 0700)  . fentaNYL infusion INTRAVENOUS 25 mcg/hr (01/12/17 0700)   PRN Meds: Place/Maintain arterial line **AND** sodium chloride, fentaNYL, levalbuterol, midazolam, ondansetron (ZOFRAN) IV   Vital Signs    Vitals:   01/12/17 0700 01/12/17 0730 01/12/17 0800 01/12/17 0805  BP: 139/90  (!) 138/92 (!) 138/92  Pulse: (!) 120  (!) 123 (!) 115  Resp: (!) 26  (!) 27 (!) 26  Temp:  98.9 F (37.2 C)    TempSrc:  Oral    SpO2: 98%  96% 96%  Weight:      Height:        Intake/Output Summary (Last 24 hours) at 01/12/2017 0849 Last data filed at 01/12/2017 0800 Gross per 24 hour  Intake 2603.46 ml  Output 2085 ml  Net 518.46 ml    Filed Weights   01/10/17 0800 01/11/17 0500 01/12/17 0500  Weight: 130 lb 15.3 oz (59.4 kg) 134 lb 11.2 oz (61.1 kg) 135 lb 2.3 oz (61.3 kg)    Telemetry    Atrial fibrillation; rate elevated- Personally Reviewed   Physical Exam   GEN: WD/frail; intubated; shakes head to questions Neck: supple Cardiac: irregular, tachycardic Respiratory: CTA anteriorly, mild rhonchi GI: ND, no masses MS: trace edema Neuro:  Intubated; shakes head to question; moves ext   Labs    Chemistry Recent Labs  Lab 01/06/17 0508  01/10/17 0438 01/11/17 0500 01/12/17 0508  NA 132*   < > 139 146* 153*  K 3.8   < > 3.4* 3.6 2.9*  CL 100*   < > 105 110 117*  CO2 21*   < > 21* 21* 25  GLUCOSE 365*   < > 278* 148* 154*  BUN 41*   < > 145* 157* 159*  CREATININE 2.20*   < > 4.31* 3.98* 3.60*  CALCIUM 8.4*   < > 8.6* 8.8* 8.8*  PROT 6.2*  --  5.3*  --   --   ALBUMIN 2.4*   < > 2.0* 2.2* 2.0*  AST 21  --  18  --   --   ALT 10*  --  17  --   --   ALKPHOS 62  --  52  --   --   BILITOT 1.0  --  0.8  --   --   GFRNONAA 21*   < > 9* 10* 12*  GFRAA 25*   < > 11* 12* 14*  ANIONGAP 11   < > 13 15 11    < > = values in this interval not displayed.     Hematology Recent Labs  Lab 01/11/17 0500 01/12/17 0508 01/12/17 0700  WBC 10.7* 9.3 10.9*  RBC 2.73* 2.56* 2.63*  HGB 8.3* 7.8* 7.9*  HCT 25.3* 23.7* 24.2*  MCV 92.7 92.6 92.0  MCH 30.4 30.5 30.0  MCHC 32.8 32.9 32.6  RDW 15.6* 15.9* 15.6*  PLT 262 247 254        Radiology    Dg Chest Port 1 View  Result Date: 01/11/2017 CLINICAL DATA:  Hypoxia EXAM: PORTABLE CHEST 1 VIEW COMPARISON:  January 10, 2017 FINDINGS: Endotracheal tube tip is 3.1 cm above the carina. Central catheter tip is at the cavoatrial junction. Nasogastric tube side port is in the stomach with tip below the diaphragm, not seen. No pneumothorax. There is patchy airspace consolidation in the left base with small left pleural effusion. There is underlying fibrosis. Heart  is upper normal in size with pulmonary vascularity within normal limits. There is aortic atherosclerosis. No adenopathy. No evident bone lesions. IMPRESSION: Tube and catheter positions as described without pneumothorax. Persistent airspace opacity, likely pneumonia, in the left base with small left effusion. Underlying fibrosis. No new opacity. Stable cardiac silhouette. There is aortic atherosclerosis. Aortic Atherosclerosis (ICD10-I70.0). Electronically Signed   By: Lowella Grip III M.D.   On: 01/11/2017 07:03    Patient Profile     71 y.o. female with new onset atrial fibrillation with rapid ventricular response, possible pneumonia and acute on chronic kidney disease. Echocardiogram this admission shows normal LV systolic function, moderate biatrial enlargement and mild right ventricular enlargement.   Assessment & Plan    1 atrial fibrillation with rapid ventricular response-Heart rate elevated. Continue Cardizem; increase metoprolol to 50 q 6 and follow. Heparin DCed due to concern for bleeding. Atrial fibrillation likely contributing to diastolic congestive heart failure. Cannot convert until she is able to tolerate anticoagulation.  2 chronic diastolic congestive heart failure-diuretics on hold. CVP 8. UOP 2250 over past 24 hrs.  3 possible pneumonia-management per CCM.  4 acute kidney disease with prior renal transplant-Nephrology following.  5 VDRF-per CCM  6 AAA-will need fu as outpt  For questions or updates, please contact [place] Please consult [email or web address] for contact info under Cardiology/STEMI.      Signed, Kirk Ruths, MD  01/12/2017, 8:49 AM

## 2017-01-13 LAB — GLUCOSE, CAPILLARY
GLUCOSE-CAPILLARY: 142 mg/dL — AB (ref 65–99)
GLUCOSE-CAPILLARY: 73 mg/dL (ref 65–99)
GLUCOSE-CAPILLARY: 211 mg/dL — AB (ref 65–99)
GLUCOSE-CAPILLARY: 191 mg/dL — AB (ref 65–99)
GLUCOSE-CAPILLARY: 189 mg/dL — AB (ref 65–99)
Glucose-Capillary: 144 mg/dL — ABNORMAL HIGH (ref 65–99)
Glucose-Capillary: 160 mg/dL — ABNORMAL HIGH (ref 65–99)

## 2017-01-13 LAB — CBC WITH DIFFERENTIAL/PLATELET
BASOS ABS: 0 10*3/uL (ref 0.0–0.1)
BASOS PCT: 0 %
EOS ABS: 0 10*3/uL (ref 0.0–0.7)
Eosinophils Relative: 0 %
HCT: 21.5 % — ABNORMAL LOW (ref 36.0–46.0)
HEMOGLOBIN: 7.1 g/dL — AB (ref 12.0–15.0)
Lymphocytes Relative: 8 %
Lymphs Abs: 0.7 10*3/uL (ref 0.7–4.0)
MCH: 30.7 pg (ref 26.0–34.0)
MCHC: 33 g/dL (ref 30.0–36.0)
MCV: 93.1 fL (ref 78.0–100.0)
Monocytes Absolute: 0.1 10*3/uL (ref 0.1–1.0)
Monocytes Relative: 2 %
NEUTROS PCT: 90 %
Neutro Abs: 8.1 10*3/uL — ABNORMAL HIGH (ref 1.7–7.7)
Platelets: 243 10*3/uL (ref 150–400)
RBC: 2.31 MIL/uL — AB (ref 3.87–5.11)
RDW: 15.9 % — ABNORMAL HIGH (ref 11.5–15.5)
WBC: 8.9 10*3/uL (ref 4.0–10.5)

## 2017-01-13 LAB — HEMOGLOBIN AND HEMATOCRIT, BLOOD
HCT: 22 % — ABNORMAL LOW (ref 36.0–46.0)
Hemoglobin: 7.1 g/dL — ABNORMAL LOW (ref 12.0–15.0)

## 2017-01-13 LAB — RENAL FUNCTION PANEL
Albumin: 1.9 g/dL — ABNORMAL LOW (ref 3.5–5.0)
Anion gap: 12 (ref 5–15)
BUN: 157 mg/dL — ABNORMAL HIGH (ref 6–20)
CALCIUM: 8.3 mg/dL — AB (ref 8.9–10.3)
CO2: 23 mmol/L (ref 22–32)
CREATININE: 3.25 mg/dL — AB (ref 0.44–1.00)
Chloride: 115 mmol/L — ABNORMAL HIGH (ref 101–111)
GFR, EST AFRICAN AMERICAN: 15 mL/min — AB (ref >60)
GFR, EST NON AFRICAN AMERICAN: 13 mL/min — AB (ref >60)
Glucose, Bld: 230 mg/dL — ABNORMAL HIGH (ref 65–99)
Phosphorus: 5.3 mg/dL — ABNORMAL HIGH (ref 2.5–4.6)
Potassium: 3.6 mmol/L (ref 3.5–5.1)
SODIUM: 150 mmol/L — AB (ref 135–145)

## 2017-01-13 LAB — MAGNESIUM: MAGNESIUM: 2.7 mg/dL — AB (ref 1.7–2.4)

## 2017-01-13 MED ORDER — PREDNISONE 5 MG/5ML PO SOLN
7.5000 mg | Freq: Every day | ORAL | Status: DC
  Administered 2017-01-14 – 2017-01-24 (×10): 7.5 mg
  Filled 2017-01-13 (×12): qty 7.5

## 2017-01-13 MED ORDER — INSULIN GLARGINE 100 UNIT/ML ~~LOC~~ SOLN
25.0000 [IU] | Freq: Every day | SUBCUTANEOUS | Status: DC
  Administered 2017-01-13 – 2017-01-14 (×2): 25 [IU] via SUBCUTANEOUS
  Filled 2017-01-13 (×2): qty 0.25

## 2017-01-13 MED ORDER — SENNOSIDES 8.8 MG/5ML PO SYRP: 5.0000 mL | ORAL_SOLUTION | Freq: Every day | ORAL | Status: DC

## 2017-01-13 NOTE — Progress Notes (Signed)
[facility] Rounding Note Subjective/Interval History:   RVR considerably better UOP down CVP 8  Couple of episodes hypotension into the 80's Hb down to 7.1  Objective Vital signs in last 24 hours: Vitals:   01/13/17 0400 01/13/17 0401 01/13/17 0500 01/13/17 0600  BP: 120/87 (!) 89/61 101/89 115/80  Pulse: (!) 105 (!) 112 (!) 102 (!) 101  Resp: (!) 26 (!) 26 (!) 25 (!) 26  Temp: 97.6 F (36.4 C)     TempSrc: Axillary     SpO2: 100% 100% 92% 95%  Weight:   61.8 kg (136 lb 3.9 oz)   Height:       Weight change: 0.5 kg (1 lb 1.6 oz)  Intake/Output Summary (Last 24 hours) at 01/13/2017 0643 Last data filed at 01/13/2017 1856 Gross per 24 hour  Intake 2253.4 ml  Output 395 ml  Net 1858.4 ml   Physical Exam:  Blood pressure 115/80, pulse (!) 101, temperature 97.6 F (36.4 C), temperature source Axillary, resp. rate (!) 26, height _0  (1.651 m), weight 61.8 kg (136 lb 3.9 oz), SpO2 95 %.  Physical Examination Intubated, hands in mittens ETT, NG, foley CVP 8 Frail in appearance Sclera anicteric Chest anteriorly coarse BS Cor irreg irreg AF rate now low 100's Abd soft and not distended. + BS Renal allograft left pelvis not tender GU foley in place, clear urine Ext no LE or UE edema  Feet in boots to prevent contractures.  Recent Labs  Lab 01/06/17 1557  01/08/17 0359 01/09/17 0455 01/10/17 0438 01/10/17 0439 01/11/17 0500 01/12/17 0508 01/12/17 1600 01/13/17 0330  NA  --    < > 133* 135 139  --  146* 153* 155* 150*  K  --    < > 4.3 3.5 3.4*  --  3.6 2.9* 3.0* 3.6  CL  --    < > 101 101 105  --  110 117* 120* 115*  CO2  --    < > 20* 18* 21*  --  21* _1 GLUCOSE  --    < > 269* 278* 278*  --  148* 154* 165* 230*  BUN  --    < > 90* 124* 145*  --  157* 159* 152* 157*  CREATININE  --    < > 3.84* 4.29* 4.31*  --  3.98* 3.60* 3.37* 3.25*  CALCIUM  --    < > 8.4* 8.6* 8.6*  --  8.8* 8.8* 8.5* 8.3*  PHOS 4.1  --   --  5.6*  --  6.3* 5.6* 4.8* 4.7* 5.3*   < > =  values in this interval not displayed.    Recent Labs  Lab 01/10/17 0438  01/12/17 0508 01/12/17 1600 01/13/17 0330  AST 18  --   --   --   --   ALT 17  --   --   --   --   ALKPHOS 52  --   --   --   --   BILITOT 0.8  --   --   --   --   PROT 5.3*  --   --   --   --   ALBUMIN 2.0*   < > 2.0* 2.0* 1.9*   < > = values in this interval not displayed.    Recent Labs  Lab 01/10/17 0439 01/11/17 0500 01/12/17 0508 01/12/17 0700 01/13/17 0330  WBC 7.1 10.7* 9.3 10.9* 8.9  NEUTROABS 6.3 8.4* 7.5  --  8.1*  HGB 7.7* 8.3* 7.8* 7.9* 7.1*  HCT 22.2* 25.3* 23.7* 24.2* 21.5*  MCV 90.6 92.7 92.6 92.0 93.1  PLT 178 262 247 254 243   Iron/TIBC/Ferritin/ %Sat    Component Value Date/Time   IRON 51 01/12/2017 0954   TIBC 262 01/12/2017 0954   FERRITIN 812.1 (H) 06/02/2015 0932   IRONPCTSAT 19 01/12/2017 0954   Recent Labs  Lab 01/12/17 1129 01/12/17 1546 01/12/17 2001 01/12/17 2318 01/13/17 0335  GLUCAP 161* 151* 73 125* 211*   Medications: . sodium chloride 10 mL/hr at 01/12/17 0700  . dexmedetomidine (PRECEDEX) IV infusion 1.2 mcg/kg/hr (01/13/17 0359)  . diltiazem (CARDIZEM) infusion 15 mg/hr (01/13/17 0230)  . feeding supplement (VITAL AF 1.2 CAL) 1,000 mL (01/12/17 1901)  . fentaNYL infusion INTRAVENOUS Stopped (01/12/17 1200)   . atorvastatin  20 mg Per Tube Daily  . azaTHIOprine  50 mg Oral Daily  . chlorhexidine gluconate (MEDLINE KIT)  15 mL Mouth Rinse BID  . Chlorhexidine Gluconate Cloth  6 each Topical Daily  . cycloSPORINE  1 drop Both Eyes BID  . docusate  100 mg Per Tube BID  . free water  300 mL Per Tube Q6H  . heparin subcutaneous  5,000 Units Subcutaneous Q8H  . hydrocortisone sod succinate (SOLU-CORTEF) inj  50 mg Intravenous Q12H  . insulin aspart  0-20 Units Subcutaneous Q4H  . insulin glargine  40 Units Subcutaneous QHS  . mouth rinse  15 mL Mouth Rinse QID  . metoprolol tartrate  50 mg Per Tube Q6H  . pantoprazole (PROTONIX) IV  40 mg  Intravenous Q12H  . QUEtiapine  50 mg Per Tube BID  . sennosides  5 mL Per Tube BID  . sodium bicarbonate  1,300 mg Per Tube BID  Place/Maintain arterial line **AND** sodium chloride, fentaNYL, levalbuterol, midazolam, ondansetron (ZOFRAN) IV  Background: 71 y.o. year-old with hx of ESRD sp renal transplant (LRD, 1984, pred/ imuran) Presented 11/14 w/SOB/ DOE and some mild CP. Recent new AF on BB and Eliquis. Hx of dCHF, not on diuretics PTA. Hypoxic on adm. BNP was 2000, initial Rx IV diltiazem for afib /RVR, not felt to be vol overloaded.  CT angio w/ contrast no PE but RUL air space disease, pulm edema vs infection.  Lactic acid 1.45, WBC 9k. Admitted, rx'd with IV diltiazem, po metoprolol, IV abx (rocephin, vanc/ zosyn) for PNA. Intubated 11/15. Admit creat 0.8, 2.2 on 11/16, 3.14 11/18 w/oliguria. Weight up 50->53 kg. CXR diffuse bilat infiltrates, R> L . Hypotension on levo gtt for about 24 hrs.  BP's in low 100 range.  ECHO showed normal LVEF.  Asked to see for AKI.    Impression: 1. AKI - IV contrast nephropathy, relative hypotension, hemodynamic effects of possible sepsis, AF w/RVR.  1. Creatinine had started to improve past 3 days, slower fall since yesterday 2. UOP down but CVP only 8, suspect some hemodynamic (intermittent hypotension, slower AF) (Last dosed Lasix AM 11/19 80 mg).  3. I would favor 1 unit transfusion and then if CVP up (out of desired range) dose of lasix (I did not order blood  pending their rounds) 2. Hypernatremia - improved from 155->150. Continue free water via tube 3. Renal transplant - Creat normal 0.8 pre CT angio.  1. Peak creatinine 4.3 Anticipate further recovery, still falling a little but slower.  2. Home meds imuran (resumed at 79 by Dr. Ashok Cordia) and prednisone (getting solucortef and reducing). 4. Hypokalemia - repleted via tube 11/23 40 TID  X 3 doses; 3.6 today 5. VDRF - per CCM 6. Multifocal PNA / hx pulm fibrosis and COPD  - S/p 7 days Zosyn 7. AF  with RVR with improved rate 8. dCHF 9. Anemia - Hb drifting down. 7.1 today. Black heme+ stool. Protonix.  1. I would recommend transfusion today of 1 unit if OK with cards and CCM 2. Iron studies with low tsat but normal total Fe.  IF Fe not indicated 10. Metabolic acidosis - mild. Na bicarb via tube. Improved. Reduced to BID. Continue that dose.  11. Encephalopathy/agitation - CCM manipulating meds  Morgan Maes, MD [facility] [phone] pager 01/13/2017, 6:43 AM

## 2017-01-13 NOTE — Procedures (Signed)
Pt placed back on full support at this time due to increased WOB, spo2 decreased 85%, pt tolerating full support well, RT will monitor

## 2017-01-13 NOTE — Plan of Care (Signed)
  Progressing Education: Knowledge of General Education information will improve 01/13/2017 0423 - Progressing by Randal Buba, RN Clinical Measurements: Ability to maintain clinical measurements within normal limits will improve 01/13/2017 0423 - Progressing by Randal Buba, RN Will remain free from infection 01/13/2017 0423 - Progressing by Randal Buba, RN Diagnostic test results will improve 01/13/2017 0423 - Progressing by Randal Buba, RN Respiratory complications will improve 01/13/2017 0423 - Progressing by Randal Buba, RN Cardiovascular complication will be avoided 01/13/2017 0423 - Progressing by Randal Buba, RN Coping: Level of anxiety will decrease 01/13/2017 0423 - Progressing by Randal Buba, RN Elimination: Will not experience complications related to urinary retention 01/13/2017 0423 - Progressing by Randal Buba, RN Pain Managment: General experience of comfort will improve 01/13/2017 0423 - Progressing by Randal Buba, RN Skin Integrity: Risk for impaired skin integrity will decrease 01/13/2017 0423 - Progressing by Randal Buba, RN Activity: Ability to tolerate increased activity will improve 01/13/2017 0423 - Progressing by Randal Buba, RN Respiratory: Ability to maintain a clear airway and adequate ventilation will improve 01/13/2017 0423 - Progressing by Randal Buba, RN

## 2017-01-13 NOTE — Plan of Care (Signed)
  Progressing Health Behavior/Discharge Planning: Ability to manage health-related needs will improve 01/13/2017 2034 - Progressing by Ernestene Kiel, RN Clinical Measurements: Diagnostic test results will improve 01/13/2017 2034 - Progressing by Ernestene Kiel, RN Respiratory complications will improve 01/13/2017 2034 - Progressing by Ernestene Kiel, RN Cardiovascular complication will be avoided 01/13/2017 2034 - Progressing by Ernestene Kiel, RN Coping: Level of anxiety will decrease 01/13/2017 2034 - Progressing by Ernestene Kiel, RN Elimination: Will not experience complications related to urinary retention 01/13/2017 2034 - Progressing by Ernestene Kiel, RN Pain Managment: General experience of comfort will improve 01/13/2017 2034 - Progressing by Ernestene Kiel, RN Skin Integrity: Risk for impaired skin integrity will decrease 01/13/2017 2034 - Progressing by Ernestene Kiel, RN

## 2017-01-13 NOTE — Progress Notes (Signed)
[facility] Attending Note  ADMISSION DATE:01/03/2017  CONSULTATION DATE:01/04/17  REFERRING ZO:XWRUE Phillip Heal, NP / TRH  CHIEF COMPLAINT:Acute Hypoxic Respiratory Failure   Presenting HPI:  71 y.o. female with prior history of tobacco use. Presented to hospital with acute hypoxic respiratory failure and atrial fibrillation with rapid ventricular response. Concern for possible healthcare associated pneumonia. 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 Also history of COPD with emphysema & ILD presumably from [place]. Patient has a known history of atrial fibrillation, stroke, sleep apnea, macular degeneration, diastolic congestive heart failure, and gastroparesis.  Subjective:  No acute events overnight. Still having melena. Patient following more commands per family at bedside.  Review of Systems:  Unable to obtain given intubation & sedation.   Vent Mode: CPAP;PSV FiO2 (%):  [30 %-40 %] 40 % Set Rate:  [26 bmp] 26 bmp PEEP:  [5 cmH20-8 cmH20] 5 cmH20 Pressure Support:  [5 cmH20] 5 cmH20 Plateau Pressure:  [18 cmH20-20 cmH20] 18 cmH20  Temp:  [97.4 F (36.3 C)-98.5 F (36.9 C)] 97.9 F (36.6 C) (11/24 0835) Pulse Rate:  [88-118] 113 (11/24 1000) Resp:  [8-26] 20 (11/24 1000) BP: (83-148)/(58-97) 115/77 (11/24 1000) SpO2:  [90 %-100 %] 93 % (11/24 1000) FiO2 (%):  [30 %-40 %] 40 % (11/24 0809) Weight:  [136 lb 3.9 oz (61.8 kg)] 136 lb 3.9 oz (61.8 kg) (11/24 0500)  General: Daughters at bedside. No distress. Neurological: Eyes open. Spontaneously moving. Not following commands. Integument: Warm. Dry. No rash. Pulmonary: Normal work of breathing on pressure support wean. Clear bilaterally to auscultation. Cardiovascular: Regular rate. Irregular rhythm. No JVD appreciated. Abdomen: Soft. Nondistended. Normal bowel sounds. HEENT: Moist with his memories. No scleral icterus. Endotracheal tube in  place.  LINES/TUBES: OETT 11/16 >>> R IJ CVL 11/16 >>> L RAD ART LINE 11/16 >>> Foley >>> OGT >>> PIV  CBC Latest Ref Rng & Units 01/13/2017 01/12/2017 01/12/2017  WBC 4.0 - 10.5 K/uL 8.9 10.9(H) 9.3  Hemoglobin 12.0 - 15.0 g/dL 7.1(L) 7.9(L) 7.8(L)  Hematocrit 36.0 - 46.0 % 21.5(L) 24.2(L) 23.7(L)  Platelets 150 - 400 K/uL 243 254 247   BMP Latest Ref Rng & Units 01/13/2017 01/12/2017 01/12/2017  Glucose 65 - 99 mg/dL 230(H) 165(H) 154(H)  BUN 6 - 20 mg/dL 157(H) 152(H) 159(H)  Creatinine 0.44 - 1.00 mg/dL 3.25(H) 3.37(H) 3.60(H)  Sodium 135 - 145 mmol/L 150(H) 155(H) 153(H)  Potassium 3.5 - 5.1 mmol/L 3.6 3.0(L) 2.9(L)  Chloride 101 - 111 mmol/L 115(H) 120(H) 117(H)  CO2 22 - 32 mmol/L 23 26 25   Calcium 8.9 - 10.3 mg/dL 8.3(L) 8.5(L) 8.8(L)   Hepatic Function Latest Ref Rng & Units 01/13/2017 01/12/2017 01/12/2017  Total Protein 6.5 - 8.1 g/dL - - -  Albumin 3.5 - 5.0 g/dL 1.9(L) 2.0(L) 2.0(L)  AST 15 - 41 U/L - - -  ALT 14 - 54 U/L - - -  Alk Phosphatase 38 - 126 U/L - - -  Total Bilirubin 0.3 - 1.2 mg/dL - - -  Bilirubin, Direct <=0.2 mg/dL - - -    IMAGING/STUDIES: CTA HEST 11/14 IMPRESSION: 1. No evidence acute pulmonary embolism. 2. Diffuse airspace disease in the RIGHT upper lobe superimposed on centrilobular emphysema. Etiology includes  Pulmonary edema versus pneumonia. 3. Coronary artery calcification 4.  Aortic Atherosclerosis (ICD10-I70.0)  5.  Emphysema (ICD10-J43.9). TTE 11/15:  LV normal in size w/ EF 55-60%. LA moderately dilated & RA moderately dilated. RV mildly dilated with preserved  systolic function. Trivial aortic regurgitation. Trivial mitral regurgitation without stenosis. No pulmonic regurgitation. Mild tricuspid regurgitation. No pericardial effusion. CT ABD/PELVIS W/O 11/19: IMPRESSION: 1. Study markedly degraded by patient motion and limited by lack of intravenous contrast material. 2. Gallbladder is distended with layering sludge or tiny  stones. There appears to be pericholecystic fluid although assessment is hindered by the motion artifact. Abdominal ultrasound may prove helpful to further evaluate. 3. 5 x 3 cm apparent fluid collection in the distal pancreatectomy bed at the site of the previous drainage catheter. This is not well evaluated given the above limitations, but seroma, hematoma, or abscess could have this appearance. 4. Lung bases appear better aerated than CT scan of 01/03/2017 although there is persistent bilateral lower lung  ground-glass attenuation potentially rated to pulmonary edema or diffuse infection. 5. New focal opacity measuring 2 cm in the left base. Given that this was not present on the study from 5 days ago, it  probably represents atelectasis or infection. 6. Similar appearance 4.3 cm abdominal aortic aneurysm. 7. No overt hydronephrosis and left pelvic transplant kidney. Gas in the bladder lumen presumably related to the  presence of a Foley catheter. 8. Small volume intraperitoneal free fluid with diffuse body wall edema. PORT CXR 11/19:  Previously reviewed by me. Endotracheal tube in good position. Enteric feeding tube coursing below the diaphragm. No new focal opacity appreciated. PORT CXR 11/21:  Previously reviewed by me. Significant rotation of the chest film to the left. Questionable silhouetting of extreme lateral left hemidiaphragm. No new focal opacity appreciated. Endotracheal tube in good position. PORT CXR 11/22:  Previously reviewed by me. Endotracheal tube in good position. Enteric feeding tube coursing below diaphragm. Film rotated to the left. No obvious new focal opacity. Silhouetting of left hemidiaphragm could be due to atelectasis or evolving infiltrate.  MICROBIOLOGY: MRSA PCR 11/14:  Negative Blood Cultures x2 11/14:  Negative Tracheal Aspirate Culture 11/16:  Normal Respiratory [place]  Respiratory Panel PCR 11/15:  Negative  Influenza A/B PCR  11/14:  Negative  Urine  Streptococcal Antigen 11/14:  Negative  Urine Legionella Antigen 11/14:  Negative   ANTIBIOTICS: Azithromycin 11/14 - 11/15 Rocephin 11/14 - 11/15 Vancomycin 11/14 - 11/18 Zosyn 11/15 - 11/22  SIGNIFICANT EVENTS: 11/14 - Admit 11/16 - Transfer to ICU with worsening respiratory status 11/18 - Changed to pressure control ventilation  11/20 - Didn't tolerate attempt to switch back from Pressure Control to PRVC. Heparin drip stopped w/ anemia.  11/21 - Started Seroquel 33m BID.  11/23 - More calm and awake today. Tolerating PS 5/5 today w/ TV >800cc.   ASSESSMENT/PLAN:  71y.o. female with chronic immunosuppression. Renal function steadily improving. Delirium slowly improving as well. Repeating pressure support wean today.   1. Acute on chronic hypoxic respiratory failure: Continuing daily pressure support wean and spontaneous breathing trial. Continuous pulse oximetry monitoring. 2. Healthcare associated pneumonia/aspiration pneumonia: Status post treatment for pneumonia with Zosyn. Plan to repeat culture for fever. 3. Acute encephalopathy/delirium: Improving slowly. Continuing Seroquel 50 mg twice a day. Monitoring EKG daily. Attempting to limit sedation and analgesia. 4. Atrial fibrillation: Improved rate control. Management per cardiology. Continuing diltiazem and Lopressor. Holding heparin drip given melena and anemia. 5. Anemia: Likely secondary to GI loss. Holding off on GI consult for now. Continuing Protonix IV every 12 hours. Repeat hemoglobin/hematocrit at 5 PM. Holding on transfusion for now. 6. Acute renal failure: Steadily improving. Trending electrolytes and renal function daily. Monitor urine output with Foley catheter.  7. Hypernatremia: Improving. Continuing free water. Monitor electrolytes daily. 8. Hypokalemia: Resolved. 9. Azotemia: Multifactorial from steroids and renal failure. Question some element of upper GI bleeding.  10. Adrenal insufficiency/chronic prednisone  therapy: Changing to Prednisone 7.84m daily starting tomorrow.  11. Hyperglycemia: Stable. No history of diabetes mellitus. Secondary to steroid therapy. Decreasing Lantus to 25 units subcutaneous daily at bedtime. Continuing sliding scale insulin with Accu-Cheks every 4 hours and resistant algorithm.  12. Acute on chronic diastolic congestive heart failure: Holding on diuresis given acute renal failure. Continuing Lipitor. 13. Severe protein-calorie malnutrition: Continuing tube feeds per dietary recommendations. 14. COPD: No signs of acute exacerbation. Continuing Duonebs every 6 hours. 15. ILD: Reportedly secondary to [place] No signs of acute flare.  Prophylaxis:  Heparin subcutaneous every 8 hours, SCDs, & Protonix IV q12hr.  Diet:  NPO. Tube feedings per dietary recommendations. Code Status:  Full Code. Disposition:  Remains critically ill in the ICU. Family Update:  Daughters updated during rounds.  I have personally spent a total of 32 minutes of critical care time today caring for the patient, updating daughters during rounds, & reviewing the patient's electronic medical record.  JSonia BallerNAshok Cordia M.D. [facility] Pager:  [phone] 7pm or if no response, call [phone] 11:01 AM 01/13/17

## 2017-01-14 LAB — CBC WITH DIFFERENTIAL/PLATELET
BASOS ABS: 0 10*3/uL (ref 0.0–0.1)
Basophils Relative: 0 %
EOS ABS: 0.4 10*3/uL (ref 0.0–0.7)
EOS PCT: 3 %
HCT: 23.8 % — ABNORMAL LOW (ref 36.0–46.0)
Hemoglobin: 7.6 g/dL — ABNORMAL LOW (ref 12.0–15.0)
LYMPHS ABS: 0.8 10*3/uL (ref 0.7–4.0)
LYMPHS PCT: 6 %
MCH: 30.2 pg (ref 26.0–34.0)
MCHC: 31.9 g/dL (ref 30.0–36.0)
MCV: 94.4 fL (ref 78.0–100.0)
MONO ABS: 0.8 10*3/uL (ref 0.1–1.0)
Monocytes Relative: 6 %
Neutro Abs: 10.5 10*3/uL — ABNORMAL HIGH (ref 1.7–7.7)
Neutrophils Relative %: 85 %
PLATELETS: 294 10*3/uL (ref 150–400)
RBC: 2.52 MIL/uL — AB (ref 3.87–5.11)
RDW: 16 % — AB (ref 11.5–15.5)
WBC: 12.4 10*3/uL — AB (ref 4.0–10.5)

## 2017-01-14 LAB — RENAL FUNCTION PANEL
Albumin: 1.9 g/dL — ABNORMAL LOW (ref 3.5–5.0)
Anion gap: 11 (ref 5–15)
BUN: 145 mg/dL — AB (ref 6–20)
CHLORIDE: 114 mmol/L — AB (ref 101–111)
CO2: 25 mmol/L (ref 22–32)
CREATININE: 2.89 mg/dL — AB (ref 0.44–1.00)
Calcium: 8.4 mg/dL — ABNORMAL LOW (ref 8.9–10.3)
GFR calc Af Amer: 18 mL/min — ABNORMAL LOW (ref >60)
GFR, EST NON AFRICAN AMERICAN: 15 mL/min — AB (ref >60)
GLUCOSE: 139 mg/dL — AB (ref 65–99)
POTASSIUM: 3.2 mmol/L — AB (ref 3.5–5.1)
Phosphorus: 5 mg/dL — ABNORMAL HIGH (ref 2.5–4.6)
Sodium: 150 mmol/L — ABNORMAL HIGH (ref 135–145)

## 2017-01-14 LAB — GLUCOSE, CAPILLARY
GLUCOSE-CAPILLARY: 120 mg/dL — AB (ref 65–99)
GLUCOSE-CAPILLARY: 146 mg/dL — AB (ref 65–99)
Glucose-Capillary: 118 mg/dL — ABNORMAL HIGH (ref 65–99)
Glucose-Capillary: 124 mg/dL — ABNORMAL HIGH (ref 65–99)
Glucose-Capillary: 137 mg/dL — ABNORMAL HIGH (ref 65–99)
Glucose-Capillary: 143 mg/dL — ABNORMAL HIGH (ref 65–99)

## 2017-01-14 LAB — MAGNESIUM: MAGNESIUM: 2.6 mg/dL — AB (ref 1.7–2.4)

## 2017-01-14 MED ORDER — POTASSIUM CHLORIDE 10 MEQ/100ML IV SOLN
10.0000 meq | INTRAVENOUS | Status: AC
  Administered 2017-01-14 (×4): 10 meq via INTRAVENOUS
  Filled 2017-01-14 (×4): qty 100

## 2017-01-14 MED ORDER — QUETIAPINE FUMARATE 100 MG PO TABS
100.0000 mg | ORAL_TABLET | Freq: Two times a day (BID) | ORAL | Status: AC
  Administered 2017-01-14 – 2017-01-15 (×2): 100 mg
  Filled 2017-01-14 (×2): qty 1

## 2017-01-14 MED ORDER — FREE WATER
300.0000 mL | Status: DC
  Administered 2017-01-14 – 2017-01-23 (×45): 300 mL

## 2017-01-14 NOTE — Progress Notes (Signed)
[facility] Attending Note  ADMISSION DATE:01/03/2017  CONSULTATION DATE:01/04/17  REFERRING XT:KWIOX Phillip Heal, NP / TRH  CHIEF COMPLAINT:Acute Hypoxic Respiratory Failure   Presenting HPI:  71 y.o. female with prior history of tobacco use. Presented to hospital with acute hypoxic respiratory failure and atrial fibrillation with rapid ventricular response. Concern for possible healthcare associated pneumonia. 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 Also history of COPD with emphysema & ILD presumably from [place]. Patient has a known history of atrial fibrillation, stroke, sleep apnea, macular degeneration, diastolic congestive heart failure, and gastroparesis.  Subjective:  No acute events overnight. Patient more awake and following commands today. Stool is no longer melanotic appearing. Patient nodding no to any pain or difficulty breathing but isn't acknowledging anxiety.  Review of Systems:  Unable to obtain given intubation.   Vent Mode: CPAP;PSV FiO2 (%):  [30 %-40 %] 40 % Set Rate:  [26 bmp] 26 bmp PEEP:  [5 cmH20] 5 cmH20 Pressure Support:  [5 cmH20] 5 cmH20 Plateau Pressure:  [18 [id number] cmH20] 18 cmH20  Temp:  [97.4 F (36.3 C)-98.4 F (36.9 C)] 98.2 F (36.8 C) (11/25 1153) Pulse Rate:  [81-115] 108 (11/25 1400) Resp:  [15-26] 18 (11/25 1400) BP: (77-137)/(56-100) 134/92 (11/25 1400) SpO2:  [88 %-100 %] 94 % (11/25 1400) FiO2 (%):  [30 %-40 %] 40 % (11/25 1119) Weight:  [135 lb 2.3 oz (61.3 kg)] 135 lb 2.3 oz (61.3 kg) (11/25 0433)  Gen.: No distress. Daughter at bedside. Awake. Integument: Warm. Dry. No rash appreciated. Cardiovascular: Tachycardic. Irregular rhythm. No JVD appreciated. Pulmonary: Clear breath sounds bilaterally. Symmetric chest wall rise on ventilator. Normal respiratory rate on pressure support 5/5. Abdomen: Soft. Nondistended. Normal bowel sounds. Neurological: Attends  to voice. Nods to questions. Raising left and right hand appropriately on command.  HEENT: Moist mucous membranes. No scleral icterus. Endotracheal tube in place.  LINES/TUBES: OETT 11/16 >>> R IJ CVL 11/16 >>> L RAD ART LINE 11/16 >>> Foley >>> OGT >>> PIV  CBC Latest Ref Rng & Units 01/14/2017 01/13/2017 01/13/2017  WBC 4.0 - 10.5 K/uL 12.4(H) - 8.9  Hemoglobin 12.0 - 15.0 g/dL 7.6(L) 7.1(L) 7.1(L)  Hematocrit 36.0 - 46.0 % 23.8(L) 22.0(L) 21.5(L)  Platelets 150 - 400 K/uL 294 - 243   BMP Latest Ref Rng & Units 01/14/2017 01/13/2017 01/12/2017  Glucose 65 - 99 mg/dL 139(H) 230(H) 165(H)  BUN 6 - 20 mg/dL 145(H) 157(H) 152(H)  Creatinine 0.44 - 1.00 mg/dL 2.89(H) 3.25(H) 3.37(H)  Sodium 135 - 145 mmol/L 150(H) 150(H) 155(H)  Potassium 3.5 - 5.1 mmol/L 3.2(L) 3.6 3.0(L)  Chloride 101 - 111 mmol/L 114(H) 115(H) 120(H)  CO2 22 - 32 mmol/L 25 23 26   Calcium 8.9 - 10.3 mg/dL 8.4(L) 8.3(L) 8.5(L)   Hepatic Function Latest Ref Rng & Units 01/14/2017 01/13/2017 01/12/2017  Total Protein 6.5 - 8.1 g/dL - - -  Albumin 3.5 - 5.0 g/dL 1.9(L) 1.9(L) 2.0(L)  AST 15 - 41 U/L - - -  ALT 14 - 54 U/L - - -  Alk Phosphatase 38 - 126 U/L - - -  Total Bilirubin 0.3 - 1.2 mg/dL - - -  Bilirubin, Direct <=0.2 mg/dL - - -    IMAGING/STUDIES: CTA HEST 11/14 IMPRESSION: 1. No evidence acute pulmonary embolism. 2. Diffuse airspace disease in the RIGHT upper lobe superimposed on centrilobular emphysema. Etiology includes  Pulmonary edema versus pneumonia. 3. Coronary artery calcification 4.  Aortic Atherosclerosis (ICD10-I70.0)  5.  Emphysema (ICD10-J43.9). TTE 11/15:  LV normal in size w/ EF 55-60%. LA moderately dilated & RA moderately dilated. RV mildly dilated with preserved systolic function. Trivial aortic regurgitation. Trivial mitral regurgitation without stenosis. No pulmonic regurgitation. Mild tricuspid regurgitation. No pericardial effusion. CT ABD/PELVIS W/O 11/19: IMPRESSION: 1.  Study markedly degraded by patient motion and limited by lack of intravenous contrast material. 2. Gallbladder is distended with layering sludge or tiny stones. There appears to be pericholecystic fluid although assessment is hindered by the motion artifact. Abdominal ultrasound may prove helpful to further evaluate. 3. 5 x 3 cm apparent fluid collection in the distal pancreatectomy bed at the site of the previous drainage catheter. This is not well evaluated given the above limitations, but seroma, hematoma, or abscess could have this appearance. 4. Lung bases appear better aerated than CT scan of 01/03/2017 although there is persistent bilateral lower lung  ground-glass attenuation potentially rated to pulmonary edema or diffuse infection. 5. New focal opacity measuring 2 cm in the left base. Given that this was not present on the study from 5 days ago, it  probably represents atelectasis or infection. 6. Similar appearance 4.3 cm abdominal aortic aneurysm. 7. No overt hydronephrosis and left pelvic transplant kidney. Gas in the bladder lumen presumably related to the  presence of a Foley catheter. 8. Small volume intraperitoneal free fluid with diffuse body wall edema. PORT CXR 11/19:  Previously reviewed by me. Endotracheal tube in good position. Enteric feeding tube coursing below the diaphragm. No new focal opacity appreciated. PORT CXR 11/21:  Previously reviewed by me. Significant rotation of the chest film to the left. Questionable silhouetting of extreme lateral left hemidiaphragm. No new focal opacity appreciated. Endotracheal tube in good position. PORT CXR 11/22:  Previously reviewed by me. Endotracheal tube in good position. Enteric feeding tube coursing below diaphragm. Film rotated to the left. No obvious new focal opacity. Silhouetting of left hemidiaphragm could be due to atelectasis or evolving infiltrate.  MICROBIOLOGY: MRSA PCR 11/14:  Negative Blood Cultures x2 11/14:   Negative Tracheal Aspirate Culture 11/16:  Normal Respiratory [place]  Respiratory Panel PCR 11/15:  Negative  Influenza A/B PCR  11/14:  Negative  Urine Streptococcal Antigen 11/14:  Negative  Urine Legionella Antigen 11/14:  Negative   ANTIBIOTICS: Azithromycin 11/14 - 11/15 Rocephin 11/14 - 11/15 Vancomycin 11/14 - 11/18 Zosyn 11/15 - 11/22  SIGNIFICANT EVENTS: 11/14 - Admit 11/16 - Transfer to ICU with worsening respiratory status 11/18 - Changed to pressure control ventilation  11/20 - Didn't tolerate attempt to switch back from Pressure Control to PRVC. Heparin drip stopped w/ anemia.  11/21 - Started Seroquel 47m BID.  11/23 - More calm and awake today. Tolerating PS 5/5 today w/ TV >800cc.  11/25 - Following commands today & more calm. Tolerating PS 5/5 again today.  ASSESSMENT/PLAN:  71y.o.  female with chronic immunosuppression. Renal function continues to improve. Urine output remained stable. Mental status is progressively improving as well with treatment of her underlying delirium. She is tolerating pressure support weaning today.   1. Acute on chronic hypoxic respiratory failure: Continuing daily pressure support wean and spontaneous breathing trials. Continuous pulse oximetry monitoring. Consider extubation in the next 24-48 hours. 2. Healthcare associated pneumonia/aspiration pneumonia: Status post treatment of seven-day course with Zosyn. Plan to repeat culture for fever. 3. Acute encephalopathy/delirium: Steadily improving. Increasing Seroquel to 100 mg via tube twice a day. Continuing to limit sedation/benzodiazepines. Monitoring QTC daily on EKG. 4. Atrial fibrillation: Management  per cardiology. Currently on Lopressor. Slowly decreasing dose of diltiazem. Holding heparin drip given melena and anemia. 5. Anemia: Hemoglobin stable. Suspect secondary to GI loss with melena. Deferring GI consultation for now. Continuing Protonix IV every 12 hours. Trending hemoglobin  daily with CBC. 6. Acute renal failure: Improving. Nephrology following. Trending electrolytes and renal function daily. Monitor urine output with Foley catheter.  7. Hypernatremia: Stable. Increased free water today by nephrology. Monitoring electrolytes daily. 8. Hypokalemia: Replacing IV w/ KCl. Repeat electrolytes in AM. 9. Azotemia: Improving. Multifactorial from steroids and renal failure. Question some element of upper GI bleeding.  10. Adrenal insufficiency/chronic prednisone therapy: Continuing Prednisone 7.39m daily.  11. Hyperglycemia: Stable and improving as steroids have tapered. No history of diabetes mellitus. Continuing Lantus 25 units subcutaneous daily at bedtime. Continuing sliding scale insulin with Accu-Cheks every 4 hours and resistant algorithm.  12. Acute on chronic diastolic congestive heart failure: Holding on diuresis given acute renal failure. Continuing Lipitor. 13. Severe protein-calorie malnutrition: Continuing tube feeds per dietary recommendations. 14. COPD: No signs of acute exacerbation. Continuing Duonebs every 6 hours. 15. ILD: Reportedly secondary to [place] No signs of acute flare.  Prophylaxis:  Heparin subcutaneous every 8 hours, SCDs, & Protonix IV q12hr.  Diet:  NPO. Tube feedings per dietary recommendations. Code Status:  Full Code. Disposition:  Remains critically ill in the ICU. Family Update:  Daughter updated during rounds today.  I have personally spent a total of 33 minutes of critical care time today caring for the patient, updating daughter during rounds, & reviewing the patient's electronic medical record.  JSonia BallerNAshok Cordia M.D. LKaiser Fnd [facility]vineP[facility] Pager:  [phone] 7pm or if no response, call [phone] PM 01/14/17

## 2017-01-14 NOTE — Progress Notes (Signed)
[facility] Rounding Note Subjective/Interval History:   Heart rate better UOP good Creatinine falling  Objective Vital signs in last 24 hours: Vitals:   01/14/17 0616 01/14/17 0630 01/14/17 0720 01/14/17 0833  BP: 113/71 (!) 104/56 (!) 85/56   Pulse: 81 97 98   Resp:  (!) 26 (!) 26   Temp:    98.4 F (36.9 C)  TempSrc:    Oral  SpO2:  98% 98%   Weight:      Height:       Weight change: -0.5 kg (-1.6 oz)  Intake/Output Summary (Last 24 hours) at 01/14/2017 0857 Last data filed at 01/14/2017 0500 Gross per 24 hour  Intake 2637.24 ml  Output 2470 ml  Net 167.24 ml   Physical Exam:  Blood pressure (!) 85/56, pulse 98, temperature 98.4 F (36.9 C), temperature source Oral, resp. rate (!) 26, height 5' 5"  (1.651 m), weight 61.3 kg (135 lb 2.3 oz), SpO2 98 %.  Physical Examination Intubated, hands in mittens Seems much more awake and responsive ETT, NG, foley CVP 6 Sclera anicteric Chest anteriorly coarse BS Cor irreg irreg AF rate now low 100's Abd soft and not distended. + BS Renal allograft left pelvis not tender GU foley in place, clear urine Ext no LE or UE edema  Feet in boots to prevent contractures.  Recent Labs  Lab 01/09/17 0455 01/10/17 0438 01/10/17 0439 01/11/17 0500 01/12/17 0508 01/12/17 1600 01/13/17 0330 01/14/17 0505  NA 135 139  --  146* 153* 155* 150* 150*  K 3.5 3.4*  --  3.6 2.9* 3.0* 3.6 3.2*  CL 101 105  --  110 117* 120* 115* 114*  CO2 18* 21*  --  21* 25 26 23 25   GLUCOSE 278* 278*  --  148* 154* 165* 230* 139*  BUN 124* 145*  --  157* 159* 152* 157* 145*  CREATININE 4.29* 4.31*  --  3.98* 3.60* 3.37* 3.25* 2.89*  CALCIUM 8.6* 8.6*  --  8.8* 8.8* 8.5* 8.3* 8.4*  PHOS 5.6*  --  6.3* 5.6* 4.8* 4.7* 5.3* 5.0*    Recent Labs  Lab 01/10/17 0438  01/12/17 1600 01/13/17 0330 01/14/17 0505  AST 18  --   --   --   --   ALT 17  --   --   --   --   ALKPHOS 52  --   --   --   --   BILITOT 0.8  --   --   --   --   PROT 5.3*  --   --   --    --   ALBUMIN 2.0*   < > 2.0* 1.9* 1.9*   < > = values in this interval not displayed.    Recent Labs  Lab 01/11/17 0500 01/12/17 0508 01/12/17 0700 01/13/17 0330 01/13/17 1658 01/14/17 0503  WBC 10.7* 9.3 10.9* 8.9  --  12.4*  NEUTROABS 8.4* 7.5  --  8.1*  --  10.5*  HGB 8.3* 7.8* 7.9* 7.1* 7.1* 7.6*  HCT 25.3* 23.7* 24.2* 21.5* 22.0* 23.8*  MCV 92.7 92.6 92.0 93.1  --  94.4  PLT 262 247 254 243  --  294   Iron/TIBC/Ferritin/ %Sat    Component Value Date/Time   IRON 51 01/12/2017 0954   TIBC 262 01/12/2017 0954   FERRITIN 812.1 (H) 06/02/2015 0932   IRONPCTSAT 19 01/12/2017 0954   Recent Labs  Lab 01/13/17 1704 01/13/17 1930 01/14/17 0012 01/14/17 0353 01/14/17 8841  GLUCAP 189* 160* 137* 124* 120*   Medications: . sodium chloride 10 mL/hr at 01/12/17 0700  . dexmedetomidine (PRECEDEX) IV infusion 0.4 mcg/kg/hr (01/14/17 0616)  . diltiazem (CARDIZEM) infusion 5 mg/hr (01/14/17 0713)  . feeding supplement (VITAL AF 1.2 CAL) 1,000 mL (01/13/17 1645)  . fentaNYL infusion INTRAVENOUS Stopped (01/13/17 1229)   . atorvastatin  20 mg Per Tube Daily  . azaTHIOprine  50 mg Oral Daily  . chlorhexidine gluconate (MEDLINE KIT)  15 mL Mouth Rinse BID  . Chlorhexidine Gluconate Cloth  6 each Topical Daily  . cycloSPORINE  1 drop Both Eyes BID  . free water  300 mL Per Tube Q6H  . heparin subcutaneous  5,000 Units Subcutaneous Q8H  . insulin aspart  0-20 Units Subcutaneous Q4H  . insulin glargine  25 Units Subcutaneous QHS  . mouth rinse  15 mL Mouth Rinse QID  . metoprolol tartrate  50 mg Per Tube Q6H  . pantoprazole (PROTONIX) IV  40 mg Intravenous Q12H  . predniSONE  7.5 mg Per Tube Q breakfast  . QUEtiapine  50 mg Per Tube BID  . sodium bicarbonate  1,300 mg Per Tube BID  Place/Maintain arterial line **AND** sodium chloride, fentaNYL, levalbuterol, midazolam, ondansetron (ZOFRAN) IV  Background: 71 y.o. year-old with hx of ESRD sp renal transplant ([facility], 1984, pred/  imuran) Presented 11/14 w/SOB/ DOE and some mild CP. Recent new AF on BB and Eliquis. Hx of dCHF, not on diuretics PTA. Hypoxic on adm. BNP was 2000, initial Rx IV diltiazem for afib /RVR, not felt to be vol overloaded.  CT angio w/ contrast no PE but RUL air space disease, pulm edema vs infection.  Lactic acid 1.45, WBC 9k. Admitted, rx'd with IV diltiazem, po metoprolol, IV abx (rocephin, vanc/ zosyn) for PNA. Intubated 11/15. Admit creat 0.8, 2.2 on 11/16, 3.14 11/18 w/oliguria. Weight up 50->53 kg. CXR diffuse bilat infiltrates, R> L . Hypotension on levo gtt for about 24 hrs.  BP's in low 100 range.  ECHO showed normal LVEF.  Asked to see for AKI.    Impression: 1. AKI - IV contrast nephropathy, relative hypotension, hemodynamic effects of possible sepsis, AF w/RVR. UOP increasing, creatinine falling 2. Renal transplant - Creat normal 0.8 pre CT angio.  Peak creatinine 4.3, now improving. Home meds imuran (resumed at 64 by Dr. Ashok Cordia) and prednisone (now at 7.5 mg). 3. Hypernatremia 1. Increase free water via tube (some improvement but still 150 and ongoing water losses from water diarrhea) -  4. Hypokalemia - Give additional repletion. 4 runs this AM for K 3.2 5. VDRF - per CCM 6. Multifocal PNA / hx pulm fibrosis and COPD  - S/p 7 days Zosyn 7. AF with RVR with improved rate 8. dCHF 9. Anemia - transfuse prn 10. Metabolic acidosis - I think can stop the sodium bicarb via tube 11. Encephalopathy/agitation - CCM manipulating meds 12. Diarrhea - per CCM  Jamal Maes, MD [facility] [phone] pager 01/14/2017, 8:57 AM

## 2017-01-15 LAB — CBC WITH DIFFERENTIAL/PLATELET
BASOS PCT: 0 %
Basophils Absolute: 0 10*3/uL (ref 0.0–0.1)
EOS PCT: 2 %
Eosinophils Absolute: 0.3 10*3/uL (ref 0.0–0.7)
HCT: 24.3 % — ABNORMAL LOW (ref 36.0–46.0)
HEMOGLOBIN: 7.7 g/dL — AB (ref 12.0–15.0)
Lymphocytes Relative: 5 %
Lymphs Abs: 0.7 10*3/uL (ref 0.7–4.0)
MCH: 30.2 pg (ref 26.0–34.0)
MCHC: 31.7 g/dL (ref 30.0–36.0)
MCV: 95.3 fL (ref 78.0–100.0)
Monocytes Absolute: 0.8 10*3/uL (ref 0.1–1.0)
Monocytes Relative: 5 %
NEUTROS PCT: 88 %
Neutro Abs: 12.9 10*3/uL — ABNORMAL HIGH (ref 1.7–7.7)
PLATELETS: 327 10*3/uL (ref 150–400)
RBC: 2.55 MIL/uL — AB (ref 3.87–5.11)
RDW: 16.1 % — ABNORMAL HIGH (ref 11.5–15.5)
WBC: 14.8 10*3/uL — AB (ref 4.0–10.5)

## 2017-01-15 LAB — GLUCOSE, CAPILLARY
GLUCOSE-CAPILLARY: 113 mg/dL — AB (ref 65–99)
GLUCOSE-CAPILLARY: 129 mg/dL — AB (ref 65–99)
GLUCOSE-CAPILLARY: 75 mg/dL (ref 65–99)
Glucose-Capillary: 106 mg/dL — ABNORMAL HIGH (ref 65–99)
Glucose-Capillary: 122 mg/dL — ABNORMAL HIGH (ref 65–99)
Glucose-Capillary: 171 mg/dL — ABNORMAL HIGH (ref 65–99)

## 2017-01-15 LAB — RENAL FUNCTION PANEL
Albumin: 2.1 g/dL — ABNORMAL LOW (ref 3.5–5.0)
Anion gap: 8 (ref 5–15)
BUN: 124 mg/dL — AB (ref 6–20)
CALCIUM: 8.5 mg/dL — AB (ref 8.9–10.3)
CHLORIDE: 115 mmol/L — AB (ref 101–111)
CO2: 25 mmol/L (ref 22–32)
CREATININE: 2.59 mg/dL — AB (ref 0.44–1.00)
GFR calc non Af Amer: 17 mL/min — ABNORMAL LOW (ref >60)
GFR, EST AFRICAN AMERICAN: 20 mL/min — AB (ref >60)
GLUCOSE: 142 mg/dL — AB (ref 65–99)
Phosphorus: 4.9 mg/dL — ABNORMAL HIGH (ref 2.5–4.6)
Potassium: 4.3 mmol/L (ref 3.5–5.1)
SODIUM: 148 mmol/L — AB (ref 135–145)

## 2017-01-15 LAB — MAGNESIUM: Magnesium: 2.4 mg/dL (ref 1.7–2.4)

## 2017-01-15 MED ORDER — INSULIN GLARGINE 100 UNIT/ML ~~LOC~~ SOLN
20.0000 [IU] | Freq: Every day | SUBCUTANEOUS | Status: DC
  Administered 2017-01-15: 20 [IU] via SUBCUTANEOUS
  Filled 2017-01-15: qty 0.2

## 2017-01-15 MED ORDER — INSULIN ASPART 100 UNIT/ML ~~LOC~~ SOLN
0.0000 [IU] | SUBCUTANEOUS | Status: DC
  Administered 2017-01-15: 3 [IU] via SUBCUTANEOUS
  Administered 2017-01-15: 2 [IU] via SUBCUTANEOUS
  Administered 2017-01-16: 3 [IU] via SUBCUTANEOUS
  Administered 2017-01-16: 5 [IU] via SUBCUTANEOUS
  Administered 2017-01-16: 3 [IU] via SUBCUTANEOUS
  Administered 2017-01-16 (×2): 2 [IU] via SUBCUTANEOUS
  Administered 2017-01-16: 3 [IU] via SUBCUTANEOUS
  Administered 2017-01-17: 2 [IU] via SUBCUTANEOUS
  Administered 2017-01-17: 5 [IU] via SUBCUTANEOUS
  Administered 2017-01-17 – 2017-01-18 (×2): 3 [IU] via SUBCUTANEOUS
  Administered 2017-01-18: 2 [IU] via SUBCUTANEOUS
  Administered 2017-01-18 (×2): 3 [IU] via SUBCUTANEOUS
  Administered 2017-01-18: 2 [IU] via SUBCUTANEOUS
  Administered 2017-01-19 – 2017-01-20 (×8): 3 [IU] via SUBCUTANEOUS
  Administered 2017-01-20 (×2): 5 [IU] via SUBCUTANEOUS
  Administered 2017-01-20 (×2): 3 [IU] via SUBCUTANEOUS
  Administered 2017-01-21: 5 [IU] via SUBCUTANEOUS
  Administered 2017-01-21 (×4): 3 [IU] via SUBCUTANEOUS
  Administered 2017-01-22 (×3): 2 [IU] via SUBCUTANEOUS
  Administered 2017-01-22: 3 [IU] via SUBCUTANEOUS
  Administered 2017-01-23 (×2): 2 [IU] via SUBCUTANEOUS
  Administered 2017-01-23: 3 [IU] via SUBCUTANEOUS
  Administered 2017-01-23: 5 [IU] via SUBCUTANEOUS
  Administered 2017-01-23: 3 [IU] via SUBCUTANEOUS
  Administered 2017-01-24 (×3): 2 [IU] via SUBCUTANEOUS
  Administered 2017-01-24: 3 [IU] via SUBCUTANEOUS
  Administered 2017-01-24: 2 [IU] via SUBCUTANEOUS

## 2017-01-15 MED ORDER — QUETIAPINE FUMARATE 100 MG PO TABS: 100.0000 mg | ORAL_TABLET | Freq: Two times a day (BID) | ORAL | Status: DC

## 2017-01-15 MED ORDER — FLUOXETINE HCL 20 MG PO CAPS
60.0000 mg | ORAL_CAPSULE | Freq: Every day | ORAL | Status: DC
  Administered 2017-01-16 – 2017-01-20 (×5): 60 mg
  Filled 2017-01-15 (×5): qty 3

## 2017-01-15 MED ORDER — FLUOXETINE HCL 20 MG PO CAPS
60.0000 mg | ORAL_CAPSULE | Freq: Every day | ORAL | Status: AC
  Administered 2017-01-15: 60 mg
  Filled 2017-01-15: qty 3

## 2017-01-15 MED ORDER — FLUOXETINE HCL 20 MG PO CAPS: 60.0000 mg | ORAL_CAPSULE | Freq: Every day | ORAL | Status: DC

## 2017-01-15 MED ORDER — QUETIAPINE FUMARATE 100 MG PO TABS
100.0000 mg | ORAL_TABLET | Freq: Two times a day (BID) | ORAL | Status: DC
  Administered 2017-01-15 – 2017-01-18 (×7): 100 mg
  Filled 2017-01-15 (×8): qty 1

## 2017-01-15 NOTE — Progress Notes (Signed)
Background: 71 y.o.year-old with hx of ESRD sp renal transplant ([facility], 1984, pred/ imuran) Presented 11/14 w/SOB/ DOE and some mild CP. Recent new AF on BB and Eliquis. Hx of dCHF, not on diuretics PTA. Hypoxic on adm. BNP was 2000, initial Rx IV diltiazem for afib /RVR, not felt to be vol overloaded. CT angio w/ contrast no PE but RUL air space disease, pulm edema vs infection. Lactic acid 1.45, WBC 9k. Admitted, rx'd with IV diltiazem, po metoprolol, IV abx (rocephin, vanc/ zosyn) for PNA. Intubated 11/15. Admit creat 0.8, 2.2 on 11/16, 3.14 11/18 w/oliguria. Weight up 50->53 kg. CXR diffuse bilat infiltrates, R>L . Hypotension on levo gtt for about 24 hrs.  BP's in low 100 range. ECHO showed normal LVEF. Asked to see for AKI.   Impression: 1. AKI - IV contrast nephropathy, relative hypotension, hemodynamic effects of possible sepsis, AF w/RVR. UOP increasing, creatinine falling 2. Renal transplant - Creat normal 0.8 pre CT angio.  Peak creatinine 4.3, now improving. Home meds imuran (resumed at 63 by Dr. Ashok Cordia) and prednisone (now at 7.5 mg). 3. Hypernatremia       free water via tube (some improvement but still 150 and ongoing water losses from water diarrhea) -  4. Hypokalemia - Improved 5. VDRF - per CCM 6. Multifocal PNA / hx pulm fibrosis and COPD  - S/p 7 days Zosyn 7. AF with RVR with improved rate 8. dCHF 9. Anemia - transfuse prn 10. Metabolic acidosis - 11. Encephalopathy/agitation - CCM manipulating meds 12. Diarrhea - per CCM  Subjective: Interval History: Sedated  Objective: Vital signs in last 24 hours: Temp:  [97.4 F (36.3 C)-98.5 F (36.9 C)] 97.5 F (36.4 C) (11/26 0840) Pulse Rate:  [100-115] 105 (11/26 1030) Resp:  [15-30] 18 (11/26 1100) BP: (77-141)/(51-100) 89/62 (11/26 1100) SpO2:  [89 %-100 %] 100 % (11/26 1030) FiO2 (%):  [40 %] 40 % (11/26 1030) Weight:  [63.3 kg (139 lb 8.8 oz)] 63.3 kg (139 lb 8.8 oz) (11/26 0400) Weight change: 2 kg (4 lb 6.6  oz)  Intake/Output from previous day: 11/25 0701 - 11/26 0700 In: 3747.9 [I.V.:492.9; YI/FO:2774; IV Piggyback:400] Out: 1635 [Urine:1285; Stool:350] Intake/Output this shift: Total I/O In: 550 [I.V.:60; NG/GT:490] Out: 1800 [Urine:1800]  Head: Normocephalic, without obvious abnormality, atraumatic Resp: clear to auscultation bilaterally Cardio: regular rate and rhythm, S1, S2 normal, no murmur, click, rub or gallop GI: soft, non-tender; bowel sounds normal; no masses,  no organomegaly Extremities: extremities normal, atraumatic, no cyanosis or edema  Lab Results: Recent Labs    01/14/17 0503 01/15/17 0358  WBC 12.4* 14.8*  HGB 7.6* 7.7*  HCT 23.8* 24.3*  PLT 294 327   BMET:  Recent Labs    01/14/17 0505 01/15/17 0358  NA 150* 148*  K 3.2* 4.3  CL 114* 115*  CO2 25 25  GLUCOSE 139* 142*  BUN 145* 124*  CREATININE 2.89* 2.59*  CALCIUM 8.4* 8.5*   No results for input(s): PTH in the last 72 hours. Iron Studies: No results for input(s): IRON, TIBC, TRANSFERRIN, FERRITIN in the last 72 hours. Studies/Results: No results found.  Scheduled: . atorvastatin  20 mg Per Tube Daily  . azaTHIOprine  50 mg Oral Daily  . chlorhexidine gluconate (MEDLINE KIT)  15 mL Mouth Rinse BID  . Chlorhexidine Gluconate Cloth  6 each Topical Daily  . cycloSPORINE  1 drop Both Eyes BID  . [START ON 01/16/2017] FLUoxetine  60 mg Per Tube Daily  . free water  300 mL Per Tube Q4H  . heparin subcutaneous  5,000 Units Subcutaneous Q8H  . insulin aspart  0-15 Units Subcutaneous Q4H  . insulin glargine  20 Units Subcutaneous QHS  . mouth rinse  15 mL Mouth Rinse QID  . metoprolol tartrate  50 mg Per Tube Q6H  . pantoprazole (PROTONIX) IV  40 mg Intravenous Q12H  . predniSONE  7.5 mg Per Tube Q breakfast  . QUEtiapine  100 mg Per Tube BID   Continuous: . sodium chloride 10 mL/hr at 01/12/17 0700  . dexmedetomidine (PRECEDEX) IV infusion Stopped (01/14/17 1000)  . diltiazem (CARDIZEM)  infusion 5 mg/hr (01/15/17 1200)  . feeding supplement (VITAL AF 1.2 CAL) 1,000 mL (01/15/17 1204)  . fentaNYL infusion INTRAVENOUS Stopped (01/15/17 0840)       LOS: 12 days   Morgan Cooper 01/15/2017,11:41 AM

## 2017-01-15 NOTE — Progress Notes (Signed)
PULMONARY / CRITICAL CARE MEDICINE   Name: Morgan Cooper MRN: [id number] DOB: Jul 08, 1945    ADMISSION DATE:01/03/2017  CONSULTATION DATE:01/04/17  REFERRING IW:LNLGX Phillip Heal, NP / TRH  CHIEF COMPLAINT:Acute Hypoxic Respiratory Failure   Presenting HPI:  71 y.o. female with prior history of tobacco use. Presented to hospital with acute hypoxic respiratory failure and atrial fibrillation with rapid ventricular response. Concern for possible healthcare associated pneumonia. 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 Also history of COPD with emphysema & ILD presumably from [place]. Patient has a known history of atrial fibrillation, stroke, sleep apnea, macular degeneration, diastolic congestive heart failure, and gastroparesis.  Subjective:  Patient unable to provide as intubated, tracks with eyes.   Review of Systems:  Unable to obtain given intubation.   Vent Mode: PCV FiO2 (%):  [40 %] 40 % Set Rate:  [26 bmp] 26 bmp Vt Set:  [460 mL] 460 mL PEEP:  [5 cmH20] 5 cmH20 Pressure Support:  [5 cmH20] 5 cmH20 Plateau Pressure:  [17 cmH20-18 cmH20] 17 cmH20  Temp:  [97.4 F (36.3 C)-98.5 F (36.9 C)] 98.3 F (36.8 C) (11/26 0344) Pulse Rate:  [100-115] 113 (11/26 0600) Resp:  [15-30] 26 (11/26 0600) BP: (77-141)/(56-100) 113/75 (11/26 0600) SpO2:  [88 %-99 %] 96 % (11/26 0600) FiO2 (%):  [40 %] 40 % (11/26 0600) Weight:  [139 lb 8.8 oz (63.3 kg)] 139 lb 8.8 oz (63.3 kg) (11/26 0400)  Gen.: No distress. Awake. Integument: Warm. Dry. No rash appreciated. Cardiovascular: Tachycardic. Irregular rhythm. No JVD appreciated. Pulmonary: Clear breath sounds bilaterally. Symmetric chest wall rise on ventilator. Normal respiratory rate on pressure support 5/5. Abdomen: Soft. Mild suprapubic distention (BS >999, will exchange foley). Normal bowel sounds. Neurological: Attends to voice. Nods to questions. Raising left and right hand  appropriately on command.  HEENT: Moist mucous membranes. No scleral icterus. Endotracheal tube in place.  LINES/TUBES: OETT 11/16 >>> R IJ CVL 11/16 >>> L RAD ART LINE 11/16 >>> Foley >>> replace 11/26 >> OGT >>> PIV  CBC Latest Ref Rng & Units 01/15/2017 01/14/2017 01/13/2017  WBC 4.0 - 10.5 K/uL 14.8(H) 12.4(H) -  Hemoglobin 12.0 - 15.0 g/dL 7.7(L) 7.6(L) 7.1(L)  Hematocrit 36.0 - 46.0 % 24.3(L) 23.8(L) 22.0(L)  Platelets 150 - 400 K/uL 327 294 -   BMP Latest Ref Rng & Units 01/15/2017 01/14/2017 01/13/2017  Glucose 65 - 99 mg/dL 142(H) 139(H) 230(H)  BUN 6 - 20 mg/dL 124(H) 145(H) 157(H)  Creatinine 0.44 - 1.00 mg/dL 2.59(H) 2.89(H) 3.25(H)  Sodium 135 - 145 mmol/L 148(H) 150(H) 150(H)  Potassium 3.5 - 5.1 mmol/L 4.3 3.2(L) 3.6  Chloride 101 - 111 mmol/L 115(H) 114(H) 115(H)  CO2 22 - 32 mmol/L 25 25 23   Calcium 8.9 - 10.3 mg/dL 8.5(L) 8.4(L) 8.3(L)   Hepatic Function Latest Ref Rng & Units 01/15/2017 01/14/2017 01/13/2017  Total Protein 6.5 - 8.1 g/dL - - -  Albumin 3.5 - 5.0 g/dL 2.1(L) 1.9(L) 1.9(L)  AST 15 - 41 U/L - - -  ALT 14 - 54 U/L - - -  Alk Phosphatase 38 - 126 U/L - - -  Total Bilirubin 0.3 - 1.2 mg/dL - - -  Bilirubin, Direct <=0.2 mg/dL - - -    IMAGING/STUDIES: CTA CHEST 11/14 IMPRESSION: 1. No evidence acute pulmonary embolism. 2. Diffuse airspace disease in the RIGHT upper lobe superimposed on centrilobular emphysema. Etiology includes  Pulmonary edema versus pneumonia. 3. Coronary artery calcification 4.  Aortic Atherosclerosis (ICD10-I70.0)  5.  Emphysema (ICD10-J43.9). TTE 11/15:  LV normal in size w/ EF 55-60%. LA moderately dilated & RA moderately dilated. RV mildly dilated with preserved systolic function. Trivial aortic regurgitation. Trivial mitral regurgitation without stenosis. No pulmonic regurgitation. Mild tricuspid regurgitation. No pericardial effusion. CT ABD/PELVIS W/O 11/19: IMPRESSION: 1. Study markedly degraded by patient  motion and limited by lack of intravenous contrast material. 2. Gallbladder is distended with layering sludge or tiny stones. There appears to be pericholecystic fluid although assessment is hindered by the motion artifact. Abdominal ultrasound may prove helpful to further evaluate. 3. 5 x 3 cm apparent fluid collection in the distal pancreatectomy bed at the site of the previous drainage catheter. This is not well evaluated given the above limitations, but seroma, hematoma, or abscess could have this appearance. 4. Lung bases appear better aerated than CT scan of 01/03/2017 although there is persistent bilateral lower lung  ground-glass attenuation potentially rated to pulmonary edema or diffuse infection. 5. New focal opacity measuring 2 cm in the left base. Given that this was not present on the study from 5 days ago, it  probably represents atelectasis or infection. 6. Similar appearance 4.3 cm abdominal aortic aneurysm. 7. No overt hydronephrosis and left pelvic transplant kidney. Gas in the bladder lumen presumably related to the  presence of a Foley catheter. 8. Small volume intraperitoneal free fluid with diffuse body wall edema. PORT CXR 11/19: Endotracheal tube in good position. Enteric feeding tube coursing below the diaphragm. No new focal opacity appreciated. PORT CXR 11/21:   Significant rotation of the chest film to the left. Questionable silhouetting of extreme lateral left hemidiaphragm. No new focal opacity appreciated. Endotracheal tube in good position. PORT CXR 11/22:   Endotracheal tube in good position. Enteric feeding tube coursing below diaphragm. Film rotated to the left. No obvious new focal opacity. Silhouetting of left hemidiaphragm could be due to atelectasis or evolving infiltrate.  MICROBIOLOGY: MRSA PCR 11/14:  Negative Blood Cultures x2 11/14:  Negative Tracheal Aspirate Culture 11/16:  Normal Respiratory [place]  Respiratory Panel PCR 11/15:  Negative  Influenza A/B  PCR  11/14:  Negative  Urine Streptococcal Antigen 11/14:  Negative  Urine Legionella Antigen 11/14:  Negative   ANTIBIOTICS: Azithromycin 11/14 - 11/15 Rocephin 11/14 - 11/15 Vancomycin 11/14 - 11/18 Zosyn 11/15 - 11/22  SIGNIFICANT EVENTS: 11/14 - Admit 11/16 - Transfer to ICU with worsening respiratory status 11/18 - Changed to pressure control ventilation  11/20 - Didn't tolerate attempt to switch back from Pressure Control to PRVC. Heparin drip stopped w/ anemia.  11/21 - Started Seroquel 60m BID.  11/23 - More calm and awake today. Tolerating PS 5/5 today w/ TV >800cc.  11/25 - Following commands today & more calm. Tolerating PS 5/5 again today. 11/26 - replacing foley, mild abdominal distention  ASSESSMENT/PLAN:  71y.o.  female with chronic immunosuppression. Renal function continues to improve. Urine output remained stable. Mental status is progressively improving as well with treatment of her underlying delirium. She is tolerating pressure support weaning.  1. Acute on chronic hypoxic respiratory failure: Continuing daily pressure support wean and spontaneous breathing trials. Continuous pulse oximetry monitoring. Consider extubation in the next 24-48 hours. 2. Healthcare associated pneumonia/aspiration pneumonia: Status post treatment of seven-day course with Zosyn. Plan to repeat culture for fever. 3. Acute encephalopathy/delirium: Steadily improving. Increasing Seroquel to 100 mg via tube twice a day. Continuing to limit sedation/benzodiazepines. Monitoring QTC daily on EKG. 4. Atrial fibrillation: Management per cardiology. Currently on  Lopressor. Slowly decreasing dose of diltiazem. Holding heparin drip given melena and anemia. 5. Anemia: Hemoglobin stable. Suspect secondary to GI loss with melena. Deferring GI consultation for now. Continuing Protonix IV every 12 hours. Trending hemoglobin daily with CBC. 6. Acute renal failure: Improving. Nephrology following. Trending  electrolytes and renal function daily. Monitor urine output with Foley catheter. 1.2L out last 24 hours. 7. Hypernatremia: Stable. Monitoring electrolytes daily. 8. Azotemia: Improving. Multifactorial from steroids and renal failure. Question some element of upper GI bleeding.  9. Adrenal insufficiency/chronic prednisone therapy: Continuing Prednisone 7.51m daily.  10. Hyperglycemia: Stable and improving as steroids have tapered. No history of diabetes mellitus. Decrease Lantus to 20U QHS. Continuing sliding scale insulin with Accu-Cheks every 4 hours and moderate algorithm.  11. Acute on chronic diastolic congestive heart failure: Holding on diuresis given acute renal failure. Continuing Lipitor. 12. Severe protein-calorie malnutrition: Continuing tube feeds per dietary recommendations. 13. COPD: No signs of acute exacerbation. Continuing Duonebs every 6 hours. 14. ILD: Reportedly secondary to [place] No signs of acute flare.  Prophylaxis:  Heparin subcutaneous every 8 hours, SCDs, & Protonix IV q12hr.  Diet:  NPO. Tube feedings per dietary recommendations. Code Status:  Full Code. Disposition:  Remains critically ill in the ICU. Family Update:  Daughter updated during rounds today.  KRalene Ok MD PGY 2 Family Medicine 7:37 AM

## 2017-01-15 NOTE — Progress Notes (Signed)
Abdomen distended and firm on exam and foley catheter has leaked on pad . Bladder scan >999. Dr Lindell Noe informed . Foley discontinued per order and new 14 french foley inserted for urinary retention.

## 2017-01-15 NOTE — Progress Notes (Signed)
Patient is presently more lethargic not restless as she has been this AM her BP has dropped to 79/57 . Dr Lindell Noe and Dr Wonda Amis informed . Will hold off extubation per orders.

## 2017-01-16 ENCOUNTER — Encounter (HOSPITAL_COMMUNITY): Payer: Self-pay

## 2017-01-16 ENCOUNTER — Inpatient Hospital Stay (HOSPITAL_COMMUNITY): Payer: Medicare Other

## 2017-01-16 DIAGNOSIS — R1084 Generalized abdominal pain: Secondary | ICD-10-CM

## 2017-01-16 DIAGNOSIS — K922 Gastrointestinal hemorrhage, unspecified: Secondary | ICD-10-CM

## 2017-01-16 DIAGNOSIS — D649 Anemia, unspecified: Secondary | ICD-10-CM

## 2017-01-16 LAB — GLUCOSE, CAPILLARY
GLUCOSE-CAPILLARY: 129 mg/dL — AB (ref 65–99)
GLUCOSE-CAPILLARY: 172 mg/dL — AB (ref 65–99)
Glucose-Capillary: 141 mg/dL — ABNORMAL HIGH (ref 65–99)
Glucose-Capillary: 150 mg/dL — ABNORMAL HIGH (ref 65–99)
Glucose-Capillary: 172 mg/dL — ABNORMAL HIGH (ref 65–99)
Glucose-Capillary: 177 mg/dL — ABNORMAL HIGH (ref 65–99)
Glucose-Capillary: 201 mg/dL — ABNORMAL HIGH (ref 65–99)

## 2017-01-16 LAB — CBC
HCT: 23.3 % — ABNORMAL LOW (ref 36.0–46.0)
Hemoglobin: 7.4 g/dL — ABNORMAL LOW (ref 12.0–15.0)
MCH: 30.6 pg (ref 26.0–34.0)
MCHC: 31.8 g/dL (ref 30.0–36.0)
MCV: 96.3 fL (ref 78.0–100.0)
PLATELETS: 383 10*3/uL (ref 150–400)
RBC: 2.42 MIL/uL — AB (ref 3.87–5.11)
RDW: 16.2 % — AB (ref 11.5–15.5)
WBC: 16.9 10*3/uL — ABNORMAL HIGH (ref 4.0–10.5)

## 2017-01-16 LAB — RENAL FUNCTION PANEL
Albumin: 2.2 g/dL — ABNORMAL LOW (ref 3.5–5.0)
Anion gap: 10 (ref 5–15)
BUN: 106 mg/dL — AB (ref 6–20)
CALCIUM: 8.8 mg/dL — AB (ref 8.9–10.3)
CO2: 24 mmol/L (ref 22–32)
CREATININE: 2.35 mg/dL — AB (ref 0.44–1.00)
Chloride: 113 mmol/L — ABNORMAL HIGH (ref 101–111)
GFR calc non Af Amer: 20 mL/min — ABNORMAL LOW (ref >60)
GFR, EST AFRICAN AMERICAN: 23 mL/min — AB (ref >60)
Glucose, Bld: 135 mg/dL — ABNORMAL HIGH (ref 65–99)
Phosphorus: 4.9 mg/dL — ABNORMAL HIGH (ref 2.5–4.6)
Potassium: 4.3 mmol/L (ref 3.5–5.1)
SODIUM: 147 mmol/L — AB (ref 135–145)

## 2017-01-16 LAB — POCT I-STAT 3, ART BLOOD GAS (G3+)
ACID-BASE DEFICIT: 1 mmol/L (ref 0.0–2.0)
Bicarbonate: 24.6 mmol/L (ref 20.0–28.0)
O2 SAT: 98 %
PH ART: 7.38 (ref 7.350–7.450)
PO2 ART: 102 mmHg (ref 83.0–108.0)
Patient temperature: 97.9
TCO2: 26 mmol/L (ref 22–32)
pCO2 arterial: 41.4 mmHg (ref 32.0–48.0)

## 2017-01-16 LAB — PREPARE RBC (CROSSMATCH)

## 2017-01-16 LAB — OCCULT BLOOD X 1 CARD TO LAB, STOOL: FECAL OCCULT BLD: POSITIVE — AB

## 2017-01-16 LAB — HEMOGLOBIN AND HEMATOCRIT, BLOOD
HCT: 22.9 % — ABNORMAL LOW (ref 36.0–46.0)
HEMOGLOBIN: 7.2 g/dL — AB (ref 12.0–15.0)

## 2017-01-16 MED ORDER — INSULIN GLARGINE 100 UNIT/ML ~~LOC~~ SOLN
15.0000 [IU] | Freq: Every day | SUBCUTANEOUS | Status: DC
  Administered 2017-01-16: 15 [IU] via SUBCUTANEOUS
  Filled 2017-01-16: qty 0.15

## 2017-01-16 MED ORDER — SODIUM CHLORIDE 0.9 % IV SOLN
Freq: Once | INTRAVENOUS | Status: AC
  Administered 2017-01-16: 18:00:00 via INTRAVENOUS

## 2017-01-16 NOTE — Procedures (Signed)
1020 called to room by RN due to increased WOB, decreased spo2 in 80's, pt placed back on full support, increased fio2 to 60%, peep to 8 per MD.  RT will monitor

## 2017-01-16 NOTE — Progress Notes (Signed)
Background: 71 y.o.year-old with hx of ESRD sp renal transplant ([facility], 1984, pred/ imuran) Presented 11/14 w/SOB/ DOE and some mild CP. Recent new AF on BB and Eliquis. Hx of dCHF, not on diuretics PTA. Hypoxic on adm. BNP was 2000, initial Rx IV diltiazem for afib /RVR,not felt to be vol overloaded. CT angio w/ contrast no PE but RUL air space disease, pulm edema vs infection. Lactic acid 1.45, WBC 9k. Admitted, rx'd with IV diltiazem, po metoprolol, IV abx (rocephin, vanc/ zosyn) for PNA. Intubated 11/15. Admit creat 0.8, 2.2 on 11/16, 3.14 11/18 w/oliguria. Weight up 50->53 kg. CXR diffuse bilat infiltrates, R>L . Hypotension on levo gtt for about 24 hrs.BP's in low 100 range. ECHO showed normal LVEF. Asked to see for AKI.   Impression: 1. AKI - IV contrast nephropathy, relative hypotension, hemodynamic effects of possible sepsis, AF w/RVR., creatinine falling 2. Renal transplant - Creat normal 0.8 pre CT angio. Peak creatinine 4.3, now improving.Home meds imuran (resumed at 32 by Dr. Ashok Cordia) and prednisone (now at 7.5 mg). 3. Hypernatremia       free water via tube (some improvement but still 150 and ongoing water losses from water diarrhea)- 4. Hypokalemia -Improved 5. VDRF - per CCM 6. Multifocal PNA / hx pulm fibrosis and COPD - S/p 7 days Zosyn 7. AF with RVR with improved rate 8. dCHF 9. Anemia -transfuse prn 10. Metabolic acidosis - 11. Encephalopathy/agitation - CCM manipulating meds 12. Diarrhea - per CCM  Subjective: Interval History: Increased resp diff  Objective: Vital signs in last 24 hours: Temp:  [96.9 F (36.1 C)-98.5 F (36.9 C)] 96.9 F (36.1 C) (11/27 1200) Pulse Rate:  [90-124] 96 (11/27 1200) Resp:  [15-30] 16 (11/27 1200) BP: (98-149)/(62-109) 98/68 (11/27 1200) SpO2:  [85 %-100 %] 100 % (11/27 1200) FiO2 (%):  [40 %-60 %] 60 % (11/27 1155) Weight:  [63 kg (138 lb 14.2 oz)] 63 kg (138 lb 14.2 oz) (11/27 0400) Weight change: -0.3 kg (-10.6  oz)  Intake/Output from previous day: 11/26 0701 - 11/27 0700 In: 3032.5 [I.V.:457.5; NG/GT:2575] Out: 3525 [Urine:3525] Intake/Output this shift: Total I/O In: 715 [I.V.:90; NG/GT:625] Out: -   General appearance: sedated Resp: rhonchi bilaterally Extremities: extremities normal, atraumatic, no cyanosis or edema  Lab Results: Recent Labs    01/15/17 0358 01/16/17 0733  WBC 14.8* 16.9*  HGB 7.7* 7.4*  HCT 24.3* 23.3*  PLT 327 383   BMET:  Recent Labs    01/15/17 0358 01/16/17 0733  NA 148* 147*  K 4.3 4.3  CL 115* 113*  CO2 25 24  GLUCOSE 142* 135*  BUN 124* 106*  CREATININE 2.59* 2.35*  CALCIUM 8.5* 8.8*   No results for input(s): PTH in the last 72 hours. Iron Studies: No results for input(s): IRON, TIBC, TRANSFERRIN, FERRITIN in the last 72 hours. Studies/Results: Dg Chest Port 1 View  Result Date: 01/16/2017 CLINICAL DATA:  71 year old female with respiratory distress. Subsequent encounter. EXAM: PORTABLE CHEST 1 VIEW COMPARISON:  01/11/2017 chest x-ray.  01/03/2017 chest CT. FINDINGS: Rotation to left. Endotracheal tube tip 3.4 cm above the carina. Right central line tip mid to distal superior vena cava level. Nasogastric tube courses below the diaphragm. Tip is not included on the present exam. Mild progression of diffuse lung disease which may represent asymmetric pulmonary edema superimposed upon chronic lung changes although difficult to exclude infectious process particularly in the left base. Bullae/ blebs projects over the heart border and is unchanged. IMPRESSION: Mild progression of  diffuse lung disease which may represent asymmetric pulmonary edema superimposed upon chronic lung changes although difficult to exclude infectious process particularly in the left base. Bullae/ blebs projects over the heart border and is unchanged. Electronically Signed   By: Genia Del M.D.   On: 01/16/2017 10:48    Scheduled: . atorvastatin  20 mg Per Tube Daily  .  azaTHIOprine  50 mg Oral Daily  . chlorhexidine gluconate (MEDLINE KIT)  15 mL Mouth Rinse BID  . Chlorhexidine Gluconate Cloth  6 each Topical Daily  . cycloSPORINE  1 drop Both Eyes BID  . FLUoxetine  60 mg Per Tube Daily  . free water  300 mL Per Tube Q4H  . insulin aspart  0-15 Units Subcutaneous Q4H  . insulin glargine  15 Units Subcutaneous QHS  . mouth rinse  15 mL Mouth Rinse QID  . metoprolol tartrate  50 mg Per Tube Q6H  . pantoprazole (PROTONIX) IV  40 mg Intravenous Q12H  . predniSONE  7.5 mg Per Tube Q breakfast  . QUEtiapine  100 mg Per Tube BID     LOS: 13 days   Estanislado Emms 01/16/2017,12:10 PM

## 2017-01-16 NOTE — Progress Notes (Signed)
Nutrition Follow-up  DOCUMENTATION CODES:   Severe malnutrition in context of chronic illness  INTERVENTION:   Continue:  Vital AF 1.2 at 55 ml/h (1320 ml per day)  Provides 1584 kcal, 99 gm protein, 1071 ml free water daily  NUTRITION DIAGNOSIS:   Severe Malnutrition related to chronic illness(CHF) as evidenced by severe muscle depletion, severe fat depletion.  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  GI following for black stools, melena. Plans for upper endoscopy to determine source of GI bleeding. TF to continue.  Patient remains intubated on ventilator support MV: 12.8 L/min Temp (24hrs), Avg:97.8 F (36.6 C), Min:96.9 F (36.1 C), Max:98.5 F (36.9 C)   Patient is currently receiving Vital AF 1.2 via OGT at 55 ml/h (1320 ml/day) to provide 1584 kcal, 99 gm protein, 1071 ml free water daily. Tolerating without difficulty.  Labs reviewed. Phosphorus 4.9 (H), sodium 147 (H) CBG's: [phone] Medications reviewed and include Colace.  Diet Order:  Diet NPO time specified  EDUCATION NEEDS:   No education needs have been identified at this time  Skin:  Skin Integrity Issues:: DTI DTI: sacrum  Last BM:  11/27 (rectal tube) FOBT+   Height:   Ht Readings from Last 1 Encounters:  01/03/17 _0  (1.651 m)    Weight:   Wt Readings from Last 1 Encounters:  01/16/17 138 lb 14.2 oz (63 kg)    Ideal Body Weight:  56.8 kg  BMI:  Body mass index is 23.11 kg/m.  Estimated Nutritional Needs:   Kcal:  1525  Protein:  75-90 gm  Fluid:  >/= 1.5 L   Molli Barrows, RD, LDN, [place] Pager [phone] After Hours Pager  [phone]

## 2017-01-16 NOTE — Consult Note (Signed)
[facility] Consult: 11:14 AM 01/16/2017  LOS: 13 days    Referring Provider: Dr Learta Codding  Primary Care Physician:  Leeroy Cha, MD Primary Gastroenterologist:  Dr. Deatra Ina >> Dr. Silverio Decamp     Reason for Consultation:  Black stools, melena   HPI: Morgan Cooper is a 71 y.o. female.  Hx ESRD.  S/p 1984 renal transplant, on Imuran, prednisone.  Diastolic CHF.  Pulmonary fibrosis, COPD, emphysema.  OSA.  CVA.  Gastroparesis, with delayed gastric emptying on GES 2010.      Appendectomy 2003.  Hyperplastic colon polyps 2005.  Hx perforated diverticulitis, s/p 2 stage sigmoid colectomy 08/2008.   2015 flex sig: normal study.   03/2015 EGD for Barrett's and gastritis surveillance (dating to 2004):  Irregular Z line, short segment Barretts (path:  Barretts without dysplasia or malignancy, moderate gastritis (path: atrophic gastritis).  Hx pancreatic cyst dating to 2009.  EUS 2009.  S/p 12/07/15 laparoscopic distal pancreatectomy of enlarging microcystic serous cyst adenoma with splenic preservation, Dr Barry Dienes.  Post operative seroma    Recently diagnosed with A fib per holter monitor study due to increasing SOB. Started on Eliquis about a week before admission.  Admitted 11/14 with Acute CHF and RUL PNA, completed Zosyn.  VDRF.  Rapid A fib.   Diastolic CHF.  AKI improving, likeley contrast nephropathy.  Met acidosis.  Severe malnutrition, started on Vital AF 1.2 tube feeds.    Pt has chronic anemia.  Hgb 9.2 one year ago, 11.5 at admission but drifted steadily to 7.1 on 11/24 .Marland Kitchen 7.7 on 11/26 .Marland Kitchen 7.4 today 11/27.  Has not received RBC transfusion.  Eliquis stopped on 11/14.   Heparin gtt 11/17 - 11/20, then SQ heparin 11/21- 11/27 (discontinued).  Protonix in place initially received this orally, converted to Protonix 40  mg IV BID 11/23.   Receiving tube feeds.   Passing liquid/soft and very deep brown but not burgundy/ black stools since her admission.  FOBT + on 11/20 and 11/27. Family says that PTA she had occasional breakthrough reflux symptoms and takes her omeprazole 40 mg daily faithfully.  Never had dark or bloody stools at home.  Does not use NSAIDs or aspirin products.  Does not drink alcohol.  No Epic records of transfusions dating back at least 15 years.   Past Medical History:  Diagnosis Date  . Anxiety   . Arthritis    hips, legs, hand   . Barrett's esophagus   . Bone pain   . Bruises easily   . Cancer ([place])    skin cancer that has been removed- L hand  . CHF (congestive heart failure) ([place])   . Chills   . CVA (cerebral infarction)    Mini Strokes   . Depression   . Disturbed concentration   . Diverticulitis of large intestine with perforation Hx colectomy  . Esophageal stricture   . Fatigue   . Forgetfulness   . Gastroparesis   . Generalized headaches   . GERD (gastroesophageal reflux disease)   . Heart failure, diastolic, chronic ([place])   .  History of hiatal hernia   . Hx of cardiovascular stress test    Lexiscan Myoview (06/2013):  No ischemia, EF 72%; Low Risk  . Hyperlipemia   . Hypertension   . Irritable   . Itch    skin  . Keratosis   . Macular degeneration    left eye  . Nausea    little vomitting  . Osteoporosis   . Pancreatic cyst   . Pancreatic mass   . Panic attacks   . Persistent headaches   . Pulmonary hypertension (HCC)   . Rapid heart rate    some times  . Renal failure    transplant - 1983,  followed by Dr. Webb   . Sleep apnea    uses at bedtime- 2liters/min , negative- apnea., last study- 2/017  . Sleep difficulties   . Stroke (HCC)   . TIA (transient ischemic attack)     Past Surgical History:  Procedure Laterality Date  . APPENDECTOMY    . cataract surgery bilateral    . COLON RESECTION  2010   Colostomy  . COLOSTOMY  2011   Reversal     . colostomy reversal    . ESOPHAGEAL DILATION    . IR GENERIC HISTORICAL  01/06/2016   IR RADIOLOGIST EVAL & MGMT 01/06/2016 Michael Shick, MD [facility] INTERV RAD  . IR GENERIC HISTORICAL  01/20/2016   IR RADIOLOGIST EVAL & MGMT 01/20/2016 John Watts, MD [facility] INTERV RAD  . IR GENERIC HISTORICAL  02/03/2016   IR RADIOLOGIST EVAL & MGMT 02/03/2016 Kelly Osborne, PA-C [facility] INTERV RAD  . IR GENERIC HISTORICAL  02/24/2016   IR RADIOLOGIST EVAL & MGMT 02/24/2016 Jaime Wagner, DO [facility] INTERV RAD  . IR GENERIC HISTORICAL  03/23/2016   IR RADIOLOGIST EVAL & MGMT 03/23/2016 Wendy Sanders Blair, PA-C [facility] INTERV RAD  . IR GENERIC HISTORICAL  04/06/2016   IR RADIOLOGIST EVAL & MGMT 04/06/2016 Darrell K Allred, PA-C [facility] INTERV RAD  . IR GENERIC HISTORICAL  04/20/2016   IR RADIOLOGIST EVAL & MGMT 04/20/2016 Michael Shick, MD [facility] INTERV RAD  . IR RADIOLOGIST EVAL & MGMT  05/23/2016  . IR RADIOLOGIST EVAL & MGMT  05/03/2016  . KIDNEY TRANSPLANT  1984  . left arm surgery     due to injury, two plates in place   . PANCREATECTOMY N/A 12/07/2015   Procedure: HAND ASSISTED LAPAROSCOPIC DISTAL PANCREATECTOMY;  Surgeon: Faera Byerly, MD;  Location: [facility] OR;  Service: General;  Laterality: N/A;  . PARATHYROIDECTOMY  11/04/2010   left inferior parathyroid    Prior to Admission medications   Medication Sig Start Date End Date Taking? Authorizing Provider  apixaban (ELIQUIS) 5 MG TABS tablet Take 1 tablet (5 mg total) 2 (two) times daily by mouth. 12/28/16  Yes Varanasi, Jayadeep S, MD  atorvastatin (LIPITOR) 20 MG tablet Take 20 mg by mouth daily.    Yes [provider]  azaTHIOprine (IMURAN) 50 MG tablet Take 50 mg by mouth daily.    Yes [provider]  cycloSPORINE (RESTASIS) 0.05 % ophthalmic emulsion Place 1 drop into both eyes 2 (two) times daily.     Yes [provider]  HYDROcodone-acetaminophen (NORCO) 10-325 MG per tablet Take 1 tablet by mouth every 6 (six) hours as needed  (pain).    Yes [provider]  metoprolol tartrate (LOPRESSOR) 50 MG tablet Take 2 tablets (100 mg total) 2 (two) times daily by mouth. 12/28/16  Yes Varanasi, Jayadeep S, MD  omeprazole (PRILOSEC)   40 MG capsule Take 40 mg 2 (two) times daily by mouth.    Yes [provider]  OXYGEN Inhale 1 L into the lungs See admin instructions. Use at bedtime and as needed during activity   Yes [provider]  predniSONE (DELTASONE) 5 MG tablet Take 7.5 mg by mouth daily with breakfast.  05/04/15  Yes [provider]  QUEtiapine (SEROQUEL) 200 MG tablet Take 200 mg by mouth at bedtime.    Yes [provider]  trimethoprim (TRIMPEX) 100 MG tablet Take 1 tablet (100 mg total) by mouth at bedtime. continuous 01/06/16  Yes Byerly, Faera, MD  amLODipine (NORVASC) 5 MG tablet Take 1 tablet (5 mg total) by mouth daily. 07/11/16 10/09/16  Varanasi, Jayadeep S, MD  FLUoxetine (PROZAC) 20 MG capsule Take 60 mg by mouth daily.     [provider]  ondansetron (ZOFRAN) 4 MG tablet Take 1 tablet (4 mg total) by mouth every 8 (eight) hours as needed for nausea or vomiting. Patient not taking: Reported on 08/15/2016 06/02/15   Nandigam, Kavitha V, MD    Scheduled Meds: . atorvastatin  20 mg Per Tube Daily  . azaTHIOprine  50 mg Oral Daily  . chlorhexidine gluconate (MEDLINE KIT)  15 mL Mouth Rinse BID  . Chlorhexidine Gluconate Cloth  6 each Topical Daily  . cycloSPORINE  1 drop Both Eyes BID  . FLUoxetine  60 mg Per Tube Daily  . free water  300 mL Per Tube Q4H  . insulin aspart  0-15 Units Subcutaneous Q4H  . insulin glargine  15 Units Subcutaneous QHS  . mouth rinse  15 mL Mouth Rinse QID  . metoprolol tartrate  50 mg Per Tube Q6H  . pantoprazole (PROTONIX) IV  40 mg Intravenous Q12H  . predniSONE  7.5 mg Per Tube Q breakfast  . QUEtiapine  100 mg Per Tube BID   Infusions: . sodium chloride 10 mL/hr at 01/12/17 0700  . diltiazem (CARDIZEM) infusion 5 mg/hr  (01/16/17 0800)  . feeding supplement (VITAL AF 1.2 CAL) 1,000 mL (01/15/17 1345)  . fentaNYL infusion INTRAVENOUS Stopped (01/16/17 0800)   PRN Meds: Place/Maintain arterial line **AND** sodium chloride, fentaNYL, levalbuterol, midazolam, ondansetron (ZOFRAN) IV   Allergies as of 01/03/2017 - Review Complete 01/03/2017  Allergen Reaction Noted  . Amoxicillin Diarrhea 05/20/2014  . Tape Rash 11/30/2015    Family History  Problem Relation Age of Onset  . Lung cancer Sister   . Heart disease Mother   . Stroke Father   . Aneurysm Brother   . Colon cancer Maternal Aunt 60  . Cancer Unknown   . Esophageal cancer Neg Hx   . Rectal cancer Neg Hx   . Stomach cancer Neg Hx   . Breast cancer Neg Hx     Social History   Socioeconomic History  . Marital status: Widowed    Spouse name: Not on file  . Number of children: 2  . Years of education: 12  . Highest education level: Not on file  Social Needs  . Financial resource strain: Not on file  . Food insecurity - worry: Not on file  . Food insecurity - inability: Not on file  . Transportation needs - medical: Not on file  . Transportation needs - non-medical: Not on file  Occupational History  . Occupation: Retired  Tobacco Use  . Smoking status: Former Smoker    Packs/day: 1.00    Years: 5.00    Pack years: 5.00      Types: Cigarettes    Last attempt to quit: 05/30/1989    Years since quitting: 27.6  . Smokeless tobacco: Never Used  Substance and Sexual Activity  . Alcohol use: No    Alcohol/week: 0.0 oz  . Drug use: No  . Sexual activity: Not on file  Other Topics Concern  . Not on file  Social History Narrative   Patient is widowed with 2 children   Patient is right handed   Patient has a high school education   Patient drinks 3 cups daily       REVIEW OF SYSTEMS: Constitutional: She is intubated and is unable to give me any statement regarding how she feels. ENT:  No nose bleeds Pulm: Again she is  intubated CV:  No palpitations, no LE edema.  GU:  No hematuria, no frequency GI: See HPI and family's report Heme: Family denies patient has any unusual or excessive bleeding/bruising tendencies. Transfusions: See HPI. Neuro:  No headaches, no peripheral tingling or numbness Derm:  No itching, no rash or sores.  Endocrine:  No sweats or chills.  No polyuria or dysuria Immunization: Not queried. Travel:  None beyond local counties in last few months.    PHYSICAL EXAM: Vital signs in last 24 hours: Vitals:   01/16/17 0900 01/16/17 1000  BP: 126/88 126/83  Pulse: (!) 101 (!) 104  Resp: 20 (!) 22  Temp:    SpO2: (!) 85% 94%   Wt Readings from Last 3 Encounters:  01/16/17 63 kg (138 lb 14.2 oz)  07/11/16 47.4 kg (104 lb 9.6 oz)  02/11/16 48.9 kg (107 lb 12.8 oz)    General: Patient sedated, intubated on vent. Head: No facial asymmetry or swelling.  No signs of head trauma. Eyes: No scleral icterus.  No conjunctival pallor. Ears: Unable to assess hearing Nose: No discharge or congestion. Mouth: OG and ET tube in place.  No blood at the mouth or in the OG tube. Neck: No JVD. Lungs: Clear bilaterally on anterior auscultation. Heart: Afib in 1teens.   Abdomen: Soft.  Not distended.  Bowel sounds present but hypoactive.  Tenderness without guard or rebound in the left upper and mid abdomen.  No masses.  Well-healed surgical scars at the midline and left lower quadrant..   Rectal: Still is in place.  The stool is very soft, pudding consistency, deep brown but not maroon/black/melenic. Musc/Skeltl: No joint erythema or gross deformities. Extremities: Limbs are thin.  No lower extremity edema. Neurologic: Unable to assess as she is sedated on the vent.  Protective mittens in place bilaterally. Skin: Some purpura on the arms.  No rash.  No suspicious lesions Nodes: No cervical or inguinal adenopathy. Psych: Sedated on vent.  Calm  Intake/Output from previous day: 11/26 0701 - 11/27  0700 In: 3032.5 [I.V.:457.5; NG/GT:2575] Out: 3525 [Urine:3525] Intake/Output this shift: Total I/O In: 70 [I.V.:15; NG/GT:55] Out: -   LAB RESULTS: Recent Labs    01/14/17 0503 01/15/17 0358 01/16/17 0733  WBC 12.4* 14.8* 16.9*  HGB 7.6* 7.7* 7.4*  HCT 23.8* 24.3* 23.3*  PLT 294 327 383   BMET Lab Results  Component Value Date   NA 147 (H) 01/16/2017   NA 148 (H) 01/15/2017   NA 150 (H) 01/14/2017   K 4.3 01/16/2017   K 4.3 01/15/2017   K 3.2 (L) 01/14/2017   CL 113 (H) 01/16/2017   CL 115 (H) 01/15/2017   CL 114 (H) 01/14/2017   CO2 24 01/16/2017   CO2   25 01/15/2017   CO2 25 01/14/2017   GLUCOSE 135 (H) 01/16/2017   GLUCOSE 142 (H) 01/15/2017   GLUCOSE 139 (H) 01/14/2017   BUN 106 (H) 01/16/2017   BUN 124 (H) 01/15/2017   BUN 145 (H) 01/14/2017   CREATININE 2.35 (H) 01/16/2017   CREATININE 2.59 (H) 01/15/2017   CREATININE 2.89 (H) 01/14/2017   CALCIUM 8.8 (L) 01/16/2017   CALCIUM 8.5 (L) 01/15/2017   CALCIUM 8.4 (L) 01/14/2017   LFT Recent Labs    01/14/17 0505 01/15/17 0358 01/16/17 0733  ALBUMIN 1.9* 2.1* 2.2*   PT/INR Lab Results  Component Value Date   INR 1.59 01/03/2017   INR 1.51 12/27/2015   INR 1.17 12/26/2015   Hepatitis Panel No results for input(s): HEPBSAG, HCVAB, HEPAIGM, HEPBIGM in the last 72 hours. C-Diff No components found for: CDIFF Lipase  No results found for: LIPASE  Drugs of Abuse  No results found for: LABOPIA, COCAINSCRNUR, LABBENZ, AMPHETMU, THCU, LABBARB   RADIOLOGY STUDIES: Dg Chest Port 1 View  Result Date: 01/16/2017 CLINICAL DATA:  71-year-old female with respiratory distress. Subsequent encounter. EXAM: PORTABLE CHEST 1 VIEW COMPARISON:  01/11/2017 chest x-ray.  01/03/2017 chest CT. FINDINGS: Rotation to left. Endotracheal tube tip 3.4 cm above the carina. Right central line tip mid to distal superior vena cava level. Nasogastric tube courses below the diaphragm. Tip is not included on the present exam.  Mild progression of diffuse lung disease which may represent asymmetric pulmonary edema superimposed upon chronic lung changes although difficult to exclude infectious process particularly in the left base. Bullae/ blebs projects over the heart border and is unchanged. IMPRESSION: Mild progression of diffuse lung disease which may represent asymmetric pulmonary edema superimposed upon chronic lung changes although difficult to exclude infectious process particularly in the left base. Bullae/ blebs projects over the heart border and is unchanged. Electronically Signed   By: Steven  Olson M.D.   On: 01/16/2017 10:48    IMPRESSION:   *   GI bleed.  Suspect upper GI bleed.  However with the Left abdominal tenderness and recent rapid a fib, could have ischemic colitis History of Barrett's esophagus for many years.  Last surveillance endoscopy 03/2015.  *   Normocytic anemia, progressive decline in Hgb.  GI bleeding contributing but at baseline she is anemic and has multiple reasons for anemia of chronic disease.  *   Multifocal HCAP.  CHF.  VDRF COPD, pulmonary fibrosis, OSA at baseline.    *  Afib.  RVR better controlled.  Eliquis started at most a week prior to admission but now discontinued.   *  Hx Microcystic serous cyst adenoma of pancreas.  Distal pancreatectomy 11/2015  *  Renal transplant 1984.  On prednisone, Imuran. Improving AKI  *  Malnutrition.  Tube feeds running at 55 ml/hour.     PLAN:     *   Suspect she will need to undergo upper endoscopy to look for sources of GI bleeding.  Dr. Stark will be seeing the patient this afternoon and make decision as to endoscopy To r/o ischemic colitis would need CT abdomen/pelvis, though lower diagnostic precision without IV contrast.     Sarah Gribbin  01/16/2017, 11:14 AM Pager: [phone]      Attending physician's note   I have taken a history, examined the patient and reviewed the chart. I agree with the Advanced Practitioner's  note, impression and recommendations. Suspected UGI bleed. LGI source such as ischemic colitis also possible. Continue to hold   anticoagulants. EGD tomorrow - discussed with pts dtr in room who consents to proceed.   Lucio Edward, MD [facility] [phone] Mon-Fri 8a-5p [phone] after 5p, weekends, holidays

## 2017-01-16 NOTE — Progress Notes (Signed)
SLP Cancellation Note  Patient Details Name: Morgan Cooper MRN: [id number] DOB: [birth year]   Cancelled treatment:       Reason Eval/Treat Not Completed: Medical issues which prohibited therapy. Received swallow eval orders, but planned extubation deferred. Will d/c orders and await new orders when pt ready.    Chayah Mckee, Katherene Ponto 01/16/2017, 7:37 AM

## 2017-01-16 NOTE — Progress Notes (Signed)
PULMONARY / CRITICAL CARE MEDICINE   Name: Morgan Cooper MRN: [id number] DOB: 10-27-1945    ADMISSION DATE:01/03/2017  CONSULTATION DATE:01/04/17  REFERRING ZH:GDJME Phillip Heal, NP / TRH  CHIEF COMPLAINT:Acute Hypoxic Respiratory Failure   Presenting HPI:  71 y.o. female with prior history of tobacco use. Presented to hospital with acute hypoxic respiratory failure and atrial fibrillation with rapid ventricular response. Concern for possible healthcare associated pneumonia. 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 Also history of COPD with emphysema & ILD presumably from [place]. Patient has a known history of atrial fibrillation, stroke, sleep apnea, macular degeneration, diastolic congestive heart failure, and gastroparesis.  Subjective:  Patient unable to provide as intubated, agitated and attempting to remove mitten.  Review of Systems:  Unable to obtain given intubation.   Vent Mode: PCV FiO2 (%):  [40 %] 40 % Set Rate:  [26 bmp] 26 bmp PEEP:  [5 cmH20] 5 cmH20 Pressure Support:  [5 cmH20-10 cmH20] 10 cmH20 Plateau Pressure:  [16 cmH20] 16 cmH20  Temp:  [97.5 F (36.4 C)-98.4 F (36.9 C)] 97.7 F (36.5 C) (11/27 0400) Pulse Rate:  [95-124] 101 (11/27 0700) Resp:  [16-30] 21 (11/27 0700) BP: (77-149)/(51-109) 131/83 (11/27 0700) SpO2:  [92 %-100 %] 93 % (11/27 0700) FiO2 (%):  [40 %] 40 % (11/27 0600) Weight:  [138 lb 14.2 oz (63 kg)] 138 lb 14.2 oz (63 kg) (11/27 0400)  Gen.: No distress. Awake. Cardiovascular: Tachycardic. Irregular rhythm. No JVD appreciated. Pulmonary: Clear breath sounds bilaterally. Symmetric chest wall rise on ventilator. Normal respiratory rate. Abdomen: Soft. Normal bowel sounds. Neurological: Attends to voice. Nods to questions. HEENT: Moist mucous membranes. No scleral icterus. Endotracheal tube in place.  LINES/TUBES: OETT 11/16 >>> R IJ CVL 11/16 >>> L RAD ART LINE 11/16 >>> Foley >>>  replace 11/26 >> OGT >>> PIV  CBC Latest Ref Rng & Units 01/15/2017 01/14/2017 01/13/2017  WBC 4.0 - 10.5 K/uL 14.8(H) 12.4(H) -  Hemoglobin 12.0 - 15.0 g/dL 7.7(L) 7.6(L) 7.1(L)  Hematocrit 36.0 - 46.0 % 24.3(L) 23.8(L) 22.0(L)  Platelets 150 - 400 K/uL 327 294 -   BMP Latest Ref Rng & Units 01/15/2017 01/14/2017 01/13/2017  Glucose 65 - 99 mg/dL 142(H) 139(H) 230(H)  BUN 6 - 20 mg/dL 124(H) 145(H) 157(H)  Creatinine 0.44 - 1.00 mg/dL 2.59(H) 2.89(H) 3.25(H)  Sodium 135 - 145 mmol/L 148(H) 150(H) 150(H)  Potassium 3.5 - 5.1 mmol/L 4.3 3.2(L) 3.6  Chloride 101 - 111 mmol/L 115(H) 114(H) 115(H)  CO2 22 - 32 mmol/L 25 25 23   Calcium 8.9 - 10.3 mg/dL 8.5(L) 8.4(L) 8.3(L)   Hepatic Function Latest Ref Rng & Units 01/15/2017 01/14/2017 01/13/2017  Total Protein 6.5 - 8.1 g/dL - - -  Albumin 3.5 - 5.0 g/dL 2.1(L) 1.9(L) 1.9(L)  AST 15 - 41 U/L - - -  ALT 14 - 54 U/L - - -  Alk Phosphatase 38 - 126 U/L - - -  Total Bilirubin 0.3 - 1.2 mg/dL - - -  Bilirubin, Direct <=0.2 mg/dL - - -    IMAGING/STUDIES: CTA CHEST 11/14 IMPRESSION: 1. No evidence acute pulmonary embolism. 2. Diffuse airspace disease in the RIGHT upper lobe superimposed on centrilobular emphysema. Etiology includes  Pulmonary edema versus pneumonia. 3. Coronary artery calcification 4.  Aortic Atherosclerosis (ICD10-I70.0)  5.  Emphysema (ICD10-J43.9). TTE 11/15:  LV normal in size w/ EF 55-60%. LA moderately dilated & RA moderately dilated. RV mildly dilated with preserved systolic function. Trivial aortic regurgitation.  Trivial mitral regurgitation without stenosis. No pulmonic regurgitation. Mild tricuspid regurgitation. No pericardial effusion. CT ABD/PELVIS W/O 11/19: IMPRESSION: 1. Study markedly degraded by patient motion and limited by lack of intravenous contrast material. 2. Gallbladder is distended with layering sludge or tiny stones. There appears to be pericholecystic fluid although assessment is hindered  by the motion artifact. Abdominal ultrasound may prove helpful to further evaluate. 3. 5 x 3 cm apparent fluid collection in the distal pancreatectomy bed at the site of the previous drainage catheter. This is not well evaluated given the above limitations, but seroma, hematoma, or abscess could have this appearance. 4. Lung bases appear better aerated than CT scan of 01/03/2017 although there is persistent bilateral lower lung  ground-glass attenuation potentially rated to pulmonary edema or diffuse infection. 5. New focal opacity measuring 2 cm in the left base. Given that this was not present on the study from 5 days ago, it  probably represents atelectasis or infection. 6. Similar appearance 4.3 cm abdominal aortic aneurysm. 7. No overt hydronephrosis and left pelvic transplant kidney. Gas in the bladder lumen presumably related to the  presence of a Foley catheter. 8. Small volume intraperitoneal free fluid with diffuse body wall edema. PORT CXR 11/19: Endotracheal tube in good position. Enteric feeding tube coursing below the diaphragm. No new focal opacity appreciated. PORT CXR 11/21:   Significant rotation of the chest film to the left. Questionable silhouetting of extreme lateral left hemidiaphragm. No new focal opacity appreciated. Endotracheal tube in good position. PORT CXR 11/22:   Endotracheal tube in good position. Enteric feeding tube coursing below diaphragm. Film rotated to the left. No obvious new focal opacity. Silhouetting of left hemidiaphragm could be due to atelectasis or evolving infiltrate.  MICROBIOLOGY: MRSA PCR 11/14:  Negative Blood Cultures x2 11/14:  Negative Tracheal Aspirate Culture 11/16:  Normal Respiratory [place]  Respiratory Panel PCR 11/15:  Negative  Influenza A/B PCR  11/14:  Negative  Urine Streptococcal Antigen 11/14:  Negative  Urine Legionella Antigen 11/14:  Negative   ANTIBIOTICS: Azithromycin 11/14 - 11/15 Rocephin 11/14 - 11/15 Vancomycin 11/14  - 11/18 Zosyn 11/15 - 11/22  SIGNIFICANT EVENTS: 11/14 - Admit 11/16 - Transfer to ICU with worsening respiratory status 11/18 - Changed to pressure control ventilation  11/20 - Didn't tolerate attempt to switch back from Pressure Control to PRVC. Heparin drip stopped w/ anemia.  11/21 - Started Seroquel 72m BID.  11/23 - More calm and awake today. Tolerating PS 5/5 today w/ TV >800cc.  11/25 - Following commands today & more calm. Tolerating PS 5/5 again today. 11/26 - replacing foley, mild abdominal distention 11/27 - plan for extubation if MS can tolerate  ASSESSMENT/PLAN:  71y.o.  female with chronic immunosuppression. Renal function continues to improve. Urine output remained stable. Mental status is progressively improving as well with treatment of her underlying delirium. She is tolerating pressure support weaning.  1. Acute on chronic hypoxic respiratory failure: Continuing daily pressure support wean and spontaneous breathing trials. Continuous pulse oximetry monitoring. Consider extubation today if MS can tolerate. 2. Healthcare associated pneumonia/aspiration pneumonia: Status post treatment of seven-day course with Zosyn. Plan to repeat culture for fever. 3. Acute encephalopathy/delirium: Steadily improving. Seroquel to 100 mg via tube twice a day. Continuing to limit sedation/benzodiazepines. Monitoring QTC daily on EKG. Restarted home prozac per tube.  4. Atrial fibrillation: Management per cardiology. Currently on Lopressor. Slowly decreasing dose of diltiazem. Holding heparin drip given melena and anemia. 5. Anemia: Hemoglobin stable.  Suspect secondary to GI loss with melena. Deferring GI consultation for now. Continuing Protonix IV every 12 hours. Trending hemoglobin daily with CBC. 6. Acute renal failure: Improving. Nephrology following. Trending electrolytes and renal function daily. Monitor urine output with Foley catheter. 1.2L out last 24 hours. 7. Hypernatremia:  Stable. Monitoring electrolytes daily. 8. Azotemia: Improving. Multifactorial from steroids and renal failure. Question some element of upper GI bleeding.  9. Adrenal insufficiency/chronic prednisone therapy: Continuing Prednisone 7.63m daily.  10. Hyperglycemia: Stable and improving as steroids have tapered. No history of diabetes mellitus. Lantus 20U QHS. Continuing sliding scale insulin with Accu-Cheks every 4 hours and moderate algorithm.  11. Acute on chronic diastolic congestive heart failure: Holding on diuresis given acute renal failure. Continuing Lipitor. 12. Severe protein-calorie malnutrition: Continuing tube feeds per dietary recommendations. 13. COPD: No signs of acute exacerbation. Continuing Duonebs every 6 hours. 14. ILD: Reportedly secondary to [place] No signs of acute flare.  Prophylaxis:  Heparin subcutaneous every 8 hours, SCDs, & Protonix IV q12hr.  Diet:  NPO. Tube feedings per dietary recommendations. Code Status:  Full Code. Disposition:  Remains critically ill in the ICU. Family Update:  Daughter updated during rounds today.  KRalene Ok MD PGY 2 Family Medicine 7:22 AM

## 2017-01-16 NOTE — H&P (View-Only) (Signed)
[facility] Consult: 11:14 AM 01/16/2017  LOS: 13 days    Referring Provider: Dr Learta Codding  Primary Care Physician:  Leeroy Cha, MD Primary Gastroenterologist:  Dr. Deatra Ina >> Dr. Silverio Decamp     Reason for Consultation:  Black stools, melena   HPI: [facility] is a 71 y.o. female.  Hx ESRD.  S/p 1984 renal transplant, on Imuran, prednisone.  Diastolic CHF.  Pulmonary fibrosis, COPD, emphysema.  OSA.  CVA.  Gastroparesis, with delayed gastric emptying on GES 2010.      Appendectomy 2003.  Hyperplastic colon polyps 2005.  Hx perforated diverticulitis, s/p 2 stage sigmoid colectomy 08/2008.   2015 flex sig: normal study.   03/2015 EGD for Barrett's and gastritis surveillance (dating to 2004):  Irregular Z line, short segment Barretts (path:  Barretts without dysplasia or malignancy, moderate gastritis (path: atrophic gastritis).  Hx pancreatic cyst dating to 2009.  EUS 2009.  S/p 12/07/15 laparoscopic distal pancreatectomy of enlarging microcystic serous cyst adenoma with splenic preservation, Dr Barry Dienes.  Post operative seroma    Recently diagnosed with A fib per holter monitor study due to increasing SOB. Started on Eliquis about a week before admission.  Admitted 11/14 with Acute CHF and RUL PNA, completed Zosyn.  VDRF.  Rapid A fib.   Diastolic CHF.  AKI improving, likeley contrast nephropathy.  Met acidosis.  Severe malnutrition, started on Vital AF 1.2 tube feeds.    Pt has chronic anemia.  Hgb 9.2 one year ago, 11.5 at admission but drifted steadily to 7.1 on 11/24 .Marland Kitchen 7.7 on 11/26 .Marland Kitchen 7.4 today 11/27.  Has not received RBC transfusion.  Eliquis stopped on 11/14.   Heparin gtt 11/17 - 11/20, then SQ heparin 11/21- 11/27 (discontinued).  Protonix in place initially received this orally, converted to Protonix 40  mg IV BID 11/23.   Receiving tube feeds.   Passing liquid/soft and very deep brown but not burgundy/ black stools since her admission.  FOBT + on 11/20 and 11/27. Family says that PTA she had occasional breakthrough reflux symptoms and takes her omeprazole 40 mg daily faithfully.  Never had dark or bloody stools at home.  Does not use NSAIDs or aspirin products.  Does not drink alcohol.  No Epic records of transfusions dating back at least 15 years.   Past Medical History:  Diagnosis Date  . Anxiety   . Arthritis    hips, legs, hand   . Barrett's esophagus   . Bone pain   . Bruises easily   . Cancer ([place])    skin cancer that has been removed- L hand  . CHF (congestive heart failure) ([place])   . Chills   . CVA (cerebral infarction)    Mini Strokes   . Depression   . Disturbed concentration   . Diverticulitis of large intestine with perforation Hx colectomy  . Esophageal stricture   . Fatigue   . Forgetfulness   . Gastroparesis   . Generalized headaches   . GERD (gastroesophageal reflux disease)   . Heart failure, diastolic, chronic ([place])   .  History of hiatal hernia   . Hx of cardiovascular stress test    Lexiscan Myoview (06/2013):  No ischemia, EF 72%; Low Risk  . Hyperlipemia   . Hypertension   . Irritable   . Itch    skin  . Keratosis   . Macular degeneration    left eye  . Nausea    little vomitting  . Osteoporosis   . Pancreatic cyst   . Pancreatic mass   . Panic attacks   . Persistent headaches   . Pulmonary hypertension ([place])   . Rapid heart rate    some times  . Renal failure    transplant - 1983,  followed by Dr. Justin Mend   . Sleep apnea    uses at bedtime- 2liters/min , negative- apnea., last study- 2/017  . Sleep difficulties   . Stroke ([place])   . TIA (transient ischemic attack)     Past Surgical History:  Procedure Laterality Date  . APPENDECTOMY    . cataract surgery bilateral    . COLON RESECTION  2010   Colostomy  . COLOSTOMY  2011   Reversal     . colostomy reversal    . ESOPHAGEAL DILATION    . IR GENERIC HISTORICAL  01/06/2016   IR RADIOLOGIST EVAL & MGMT 01/06/2016 Greggory Keen, MD [facility] INTERV RAD  . IR GENERIC HISTORICAL  01/20/2016   IR RADIOLOGIST EVAL & MGMT 01/20/2016 Sandi Mariscal, MD [facility] INTERV RAD  . IR GENERIC HISTORICAL  02/03/2016   IR RADIOLOGIST EVAL & MGMT 02/03/2016 Saverio Danker, PA-C [facility] INTERV RAD  . IR GENERIC HISTORICAL  02/24/2016   IR RADIOLOGIST EVAL & MGMT 02/24/2016 Corrie Mckusick, DO [facility] INTERV RAD  . IR GENERIC HISTORICAL  03/23/2016   IR RADIOLOGIST EVAL & MGMT 03/23/2016 Ardis Rowan, PA-C [facility] INTERV RAD  . IR GENERIC HISTORICAL  04/06/2016   IR RADIOLOGIST EVAL & MGMT 04/06/2016 Darrell K Allred, PA-C [facility] INTERV RAD  . IR GENERIC HISTORICAL  04/20/2016   IR RADIOLOGIST EVAL & MGMT 04/20/2016 Greggory Keen, MD [facility] INTERV RAD  . IR RADIOLOGIST EVAL & MGMT  05/23/2016  . IR RADIOLOGIST EVAL & MGMT  05/03/2016  . KIDNEY TRANSPLANT  1984  . left arm surgery     due to injury, two plates in place   . PANCREATECTOMY N/A 12/07/2015   Procedure: HAND ASSISTED LAPAROSCOPIC DISTAL PANCREATECTOMY;  Surgeon:  Klein, MD;  Location: [facility];  Service: General;  Laterality: N/A;  . PARATHYROIDECTOMY  11/04/2010   left inferior parathyroid    Prior to Admission medications   Medication Sig Start Date End Date Taking? Authorizing Provider  apixaban (ELIQUIS) 5 MG TABS tablet Take 1 tablet (5 mg total) 2 (two) times daily by mouth. 12/28/16  Yes Jettie Booze, MD  atorvastatin (LIPITOR) 20 MG tablet Take 20 mg by mouth daily.    Yes [provider]  azaTHIOprine (IMURAN) 50 MG tablet Take 50 mg by mouth daily.    Yes [provider]  cycloSPORINE (RESTASIS) 0.05 % ophthalmic emulsion Place 1 drop into both eyes 2 (two) times daily.     Yes [provider]  HYDROcodone-acetaminophen (NORCO) 10-325 MG per tablet Take 1 tablet by mouth every 6 (six) hours as needed  (pain).    Yes [provider]  metoprolol tartrate (LOPRESSOR) 50 MG tablet Take 2 tablets (100 mg total) 2 (two) times daily by mouth. 12/28/16  Yes Jettie Booze, MD  omeprazole [facility])  40 MG capsule Take 40 mg 2 (two) times daily by mouth.    Yes [provider]  OXYGEN Inhale 1 L into the lungs See admin instructions. Use at bedtime and as needed during activity   Yes [provider]  predniSONE (DELTASONE) 5 MG tablet Take 7.5 mg by mouth daily with breakfast.  05/04/15  Yes [provider]  QUEtiapine (SEROQUEL) 200 MG tablet Take 200 mg by mouth at bedtime.    Yes [provider]  trimethoprim (TRIMPEX) 100 MG tablet Take 1 tablet (100 mg total) by mouth at bedtime. continuous 01/06/16  Yes  Klein, MD  amLODipine (NORVASC) 5 MG tablet Take 1 tablet (5 mg total) by mouth daily. 07/11/16 10/09/16  Jettie Booze, MD  FLUoxetine (PROZAC) 20 MG capsule Take 60 mg by mouth daily.     [provider]  ondansetron (ZOFRAN) 4 MG tablet Take 1 tablet (4 mg total) by mouth every 8 (eight) hours as needed for nausea or vomiting. Patient not taking: Reported on 08/15/2016 06/02/15   Mauri Pole, MD    Scheduled Meds: . atorvastatin  20 mg Per Tube Daily  . azaTHIOprine  50 mg Oral Daily  . chlorhexidine gluconate (MEDLINE KIT)  15 mL Mouth Rinse BID  . Chlorhexidine Gluconate Cloth  6 each Topical Daily  . cycloSPORINE  1 drop Both Eyes BID  . FLUoxetine  60 mg Per Tube Daily  . free water  300 mL Per Tube Q4H  . insulin aspart  0-15 Units Subcutaneous Q4H  . insulin glargine  15 Units Subcutaneous QHS  . mouth rinse  15 mL Mouth Rinse QID  . metoprolol tartrate  50 mg Per Tube Q6H  . pantoprazole (PROTONIX) IV  40 mg Intravenous Q12H  . predniSONE  7.5 mg Per Tube Q breakfast  . QUEtiapine  100 mg Per Tube BID   Infusions: . sodium chloride 10 mL/hr at 01/12/17 0700  . diltiazem (CARDIZEM) infusion 5 mg/hr  (01/16/17 0800)  . feeding supplement (VITAL AF 1.2 CAL) 1,000 mL (01/15/17 1345)  . fentaNYL infusion INTRAVENOUS Stopped (01/16/17 0800)   PRN Meds: Place/Maintain arterial line **AND** sodium chloride, fentaNYL, levalbuterol, midazolam, ondansetron (ZOFRAN) IV   Allergies as of 01/03/2017 - Review Complete 01/03/2017  Allergen Reaction Noted  . Amoxicillin Diarrhea 05/20/2014  . Tape Rash 11/30/2015    Family History  Problem Relation Age of Onset  . Lung cancer Sister   . Heart disease Mother   . Stroke Father   . Aneurysm Brother   . Colon cancer Maternal Aunt 78  . Cancer Unknown   . Esophageal cancer Neg Hx   . Rectal cancer Neg Hx   . Stomach cancer Neg Hx   . Breast cancer Neg Hx     Social History   Socioeconomic History  . Marital status: Widowed    Spouse name: Not on file  . Number of children: 2  . Years of education: 82  . Highest education level: Not on file  Social Needs  . Financial resource strain: Not on file  . Food insecurity - worry: Not on file  . Food insecurity - inability: Not on file  . Transportation needs - medical: Not on file  . Transportation needs - non-medical: Not on file  Occupational History  . Occupation: Retired  Tobacco Use  . Smoking status: Former Smoker    Packs/day: 1.00    Years: 5.00    Pack years: 5.00  Types: Cigarettes    Last attempt to quit: 05/30/1989    Years since quitting: 27.6  . Smokeless tobacco: Never Used  Substance and Sexual Activity  . Alcohol use: No    Alcohol/week: 0.0 oz  . Drug use: No  . Sexual activity: Not on file  Other Topics Concern  . Not on file  Social History Narrative   Patient is widowed with 2 children   Patient is right handed   Patient has a high school education   Patient drinks 3 cups daily       REVIEW OF SYSTEMS: Constitutional: She is intubated and is unable to give me any statement regarding how she feels. ENT:  No nose bleeds Pulm: Again she is  intubated CV:  No palpitations, no LE edema.  GU:  No hematuria, no frequency GI: See HPI and family's report Heme: Family denies patient has any unusual or excessive bleeding/bruising tendencies. Transfusions: See HPI. Neuro:  No headaches, no peripheral tingling or numbness Derm:  No itching, no rash or sores.  Endocrine:  No sweats or chills.  No polyuria or dysuria Immunization: Not queried. Travel:  None beyond local counties in last few months.    PHYSICAL EXAM: Vital signs in last 24 hours: Vitals:   01/16/17 0900 01/16/17 1000  BP: 126/88 126/83  Pulse: (!) 101 (!) 104  Resp: 20 (!) 22  Temp:    SpO2: (!) 85% 94%   Wt Readings from Last 3 Encounters:  01/16/17 63 kg (138 lb 14.2 oz)  07/11/16 47.4 kg (104 lb 9.6 oz)  02/11/16 48.9 kg (107 lb 12.8 oz)    General: Patient sedated, intubated on vent. Head: No facial asymmetry or swelling.  No signs of head trauma. Eyes: No scleral icterus.  No conjunctival pallor. Ears: Unable to assess hearing Nose: No discharge or congestion. Mouth: OG and ET tube in place.  No blood at the mouth or in the OG tube. Neck: No JVD. Lungs: Clear bilaterally on anterior auscultation. Heart: Afib in 1teens.   Abdomen: Soft.  Not distended.  Bowel sounds present but hypoactive.  Tenderness without guard or rebound in the left upper and mid abdomen.  No masses.  Well-healed surgical scars at the midline and left lower quadrant..   Rectal: Still is in place.  The stool is very soft, pudding consistency, deep brown but not maroon/black/melenic. Musc/Skeltl: No joint erythema or gross deformities. Extremities: Limbs are thin.  No lower extremity edema. Neurologic: Unable to assess as she is sedated on the vent.  Protective mittens in place bilaterally. Skin: Some purpura on the arms.  No rash.  No suspicious lesions Nodes: No cervical or inguinal adenopathy. Psych: Sedated on vent.  Calm  Intake/Output from previous day: 11/26 0701 - 11/27  0700 In: 3032.5 [I.V.:457.5; NG/GT:2575] Out: 3525 [Urine:3525] Intake/Output this shift: Total I/O In: 70 [I.V.:15; NG/GT:55] Out: -   LAB RESULTS: Recent Labs    01/14/17 0503 01/15/17 0358 01/16/17 0733  WBC 12.4* 14.8* 16.9*  HGB 7.6* 7.7* 7.4*  HCT 23.8* 24.3* 23.3*  PLT 294 327 383   BMET Lab Results  Component Value Date   NA 147 (H) 01/16/2017   NA 148 (H) 01/15/2017   NA 150 (H) 01/14/2017   K 4.3 01/16/2017   K 4.3 01/15/2017   K 3.2 (L) 01/14/2017   CL 113 (H) 01/16/2017   CL 115 (H) 01/15/2017   CL 114 (H) 01/14/2017   CO2 24 01/16/2017   CO2  25 01/15/2017   CO2 25 01/14/2017   GLUCOSE 135 (H) 01/16/2017   GLUCOSE 142 (H) 01/15/2017   GLUCOSE 139 (H) 01/14/2017   BUN 106 (H) 01/16/2017   BUN 124 (H) 01/15/2017   BUN 145 (H) 01/14/2017   CREATININE 2.35 (H) 01/16/2017   CREATININE 2.59 (H) 01/15/2017   CREATININE 2.89 (H) 01/14/2017   CALCIUM 8.8 (L) 01/16/2017   CALCIUM 8.5 (L) 01/15/2017   CALCIUM 8.4 (L) 01/14/2017   LFT Recent Labs    01/14/17 0505 01/15/17 0358 01/16/17 0733  ALBUMIN 1.9* 2.1* 2.2*   PT/INR Lab Results  Component Value Date   INR 1.59 01/03/2017   INR 1.51 12/27/2015   INR 1.17 12/26/2015   Hepatitis Panel No results for input(s): HEPBSAG, HCVAB, HEPAIGM, HEPBIGM in the last 72 hours. C-Diff No components found for: CDIFF Lipase  No results found for: LIPASE  Drugs of Abuse  No results found for: LABOPIA, COCAINSCRNUR, LABBENZ, AMPHETMU, THCU, LABBARB   RADIOLOGY STUDIES: Dg Chest Port 1 View  Result Date: 01/16/2017 CLINICAL DATA:  71 year old female with respiratory distress. Subsequent encounter. EXAM: PORTABLE CHEST 1 VIEW COMPARISON:  01/11/2017 chest x-ray.  01/03/2017 chest CT. FINDINGS: Rotation to left. Endotracheal tube tip 3.4 cm above the carina. Right central line tip mid to distal superior vena cava level. Nasogastric tube courses below the diaphragm. Tip is not included on the present exam.  Mild progression of diffuse lung disease which may represent asymmetric pulmonary edema superimposed upon chronic lung changes although difficult to exclude infectious process particularly in the left base. Bullae/ blebs projects over the heart border and is unchanged. IMPRESSION: Mild progression of diffuse lung disease which may represent asymmetric pulmonary edema superimposed upon chronic lung changes although difficult to exclude infectious process particularly in the left base. Bullae/ blebs projects over the heart border and is unchanged. Electronically Signed   By: Genia Del M.D.   On: 01/16/2017 10:48    IMPRESSION:   *   GI bleed.  Suspect upper GI bleed.  However with the Left abdominal tenderness and recent rapid a fib, could have ischemic colitis History of Barrett's esophagus for many years.  Last surveillance endoscopy 03/2015.  *   Normocytic anemia, progressive decline in Hgb.  GI bleeding contributing but at baseline she is anemic and has multiple reasons for anemia of chronic disease.  *   Multifocal HCAP.  CHF.  VDRF COPD, pulmonary fibrosis, OSA at baseline.    *  Afib.  RVR better controlled.  Eliquis started at most a week prior to admission but now discontinued.   *  Hx Microcystic serous cyst adenoma of pancreas.  Distal pancreatectomy 11/2015  *  Renal transplant 1984.  On prednisone, Imuran. Improving AKI  *  Malnutrition.  Tube feeds running at 55 ml/hour.     PLAN:     *   Suspect she will need to undergo upper endoscopy to look for sources of GI bleeding.  Dr. Fuller Plan will be seeing the patient this afternoon and make decision as to endoscopy To r/o ischemic colitis would need CT abdomen/pelvis, though lower diagnostic precision without IV contrast.     Azucena Freed  01/16/2017, 11:14 AM Pager: [phone]      Attending physician's note   I have taken a history, examined the patient and reviewed the chart. I agree with the Advanced Practitioner's  note, impression and recommendations. Suspected UGI bleed. LGI source such as ischemic colitis also possible. Continue to hold  anticoagulants. EGD tomorrow - discussed with pts dtr in room who consents to proceed.   Lucio Edward, MD [facility] [phone] Mon-Fri 8a-5p [phone] after 5p, weekends, holidays

## 2017-01-17 ENCOUNTER — Encounter (HOSPITAL_COMMUNITY)
Admission: EM | Disposition: A | Discharge status: Nursing Home (Includes ICF) | Payer: Self-pay | Source: Home / Self Care | Attending: Pulmonary Disease

## 2017-01-17 ENCOUNTER — Inpatient Hospital Stay (HOSPITAL_COMMUNITY): Payer: Medicare Other

## 2017-01-17 ENCOUNTER — Encounter (HOSPITAL_COMMUNITY): Payer: Self-pay

## 2017-01-17 DIAGNOSIS — K921 Melena: Secondary | ICD-10-CM

## 2017-01-17 DIAGNOSIS — K2970 Gastritis, unspecified, without bleeding: Secondary | ICD-10-CM

## 2017-01-17 HISTORY — PX: ESOPHAGOGASTRODUODENOSCOPY: SHX5428

## 2017-01-17 LAB — TYPE AND SCREEN
ABO/RH(D): AB POS
ANTIBODY SCREEN: NEGATIVE
Unit division: 0

## 2017-01-17 LAB — BPAM RBC
Blood Product Expiration Date: 201812032359
ISSUE DATE / TIME: 201811271811
Unit Type and Rh: 8400

## 2017-01-17 LAB — CBC
HEMATOCRIT: 28.2 % — AB (ref 36.0–46.0)
HEMOGLOBIN: 9.1 g/dL — AB (ref 12.0–15.0)
MCH: 30.1 pg (ref 26.0–34.0)
MCHC: 32.3 g/dL (ref 30.0–36.0)
MCV: 93.4 fL (ref 78.0–100.0)
Platelets: 389 10*3/uL (ref 150–400)
RBC: 3.02 MIL/uL — AB (ref 3.87–5.11)
RDW: 17.6 % — ABNORMAL HIGH (ref 11.5–15.5)
WBC: 16.7 10*3/uL — ABNORMAL HIGH (ref 4.0–10.5)

## 2017-01-17 LAB — BASIC METABOLIC PANEL
ANION GAP: 7 (ref 5–15)
BUN: 97 mg/dL — ABNORMAL HIGH (ref 6–20)
CO2: 26 mmol/L (ref 22–32)
Calcium: 9.1 mg/dL (ref 8.9–10.3)
Chloride: 113 mmol/L — ABNORMAL HIGH (ref 101–111)
Creatinine, Ser: 2.15 mg/dL — ABNORMAL HIGH (ref 0.44–1.00)
GFR, EST AFRICAN AMERICAN: 25 mL/min — AB (ref >60)
GFR, EST NON AFRICAN AMERICAN: 22 mL/min — AB (ref >60)
GLUCOSE: 105 mg/dL — AB (ref 65–99)
POTASSIUM: 4.9 mmol/L (ref 3.5–5.1)
Sodium: 146 mmol/L — ABNORMAL HIGH (ref 135–145)

## 2017-01-17 LAB — GLUCOSE, CAPILLARY
GLUCOSE-CAPILLARY: 111 mg/dL — AB (ref 65–99)
GLUCOSE-CAPILLARY: 119 mg/dL — AB (ref 65–99)
GLUCOSE-CAPILLARY: 152 mg/dL — AB (ref 65–99)
GLUCOSE-CAPILLARY: 221 mg/dL — AB (ref 65–99)
Glucose-Capillary: 97 mg/dL (ref 65–99)
Glucose-Capillary: 106 mg/dL — ABNORMAL HIGH (ref 65–99)

## 2017-01-17 LAB — HEMOGLOBIN AND HEMATOCRIT, BLOOD
HEMATOCRIT: 25.1 % — AB (ref 36.0–46.0)
HEMOGLOBIN: 8.1 g/dL — AB (ref 12.0–15.0)

## 2017-01-17 SURGERY — EGD (ESOPHAGOGASTRODUODENOSCOPY)
Anesthesia: Moderate Sedation

## 2017-01-17 MED ORDER — INSULIN GLARGINE 100 UNIT/ML ~~LOC~~ SOLN
10.0000 [IU] | Freq: Every day | SUBCUTANEOUS | Status: DC
  Administered 2017-01-17 – 2017-01-23 (×7): 10 [IU] via SUBCUTANEOUS
  Filled 2017-01-17 (×8): qty 0.1

## 2017-01-17 MED ORDER — SODIUM CHLORIDE 0.9% FLUSH: 10.0000 mL | INTRAVENOUS | Status: DC | PRN

## 2017-01-17 MED ORDER — FUROSEMIDE 10 MG/ML IJ SOLN
40.0000 mg | Freq: Once | INTRAMUSCULAR | Status: AC
  Administered 2017-01-17: 40 mg via INTRAVENOUS
  Filled 2017-01-17: qty 4

## 2017-01-17 MED ORDER — METOPROLOL TARTRATE 100 MG PO TABS
100.0000 mg | ORAL_TABLET | Freq: Two times a day (BID) | ORAL | Status: DC
  Administered 2017-01-17 – 2017-01-20 (×6): 100 mg
  Filled 2017-01-17 (×7): qty 1

## 2017-01-17 MED ORDER — MIDAZOLAM HCL 5 MG/ML IJ SOLN
INTRAMUSCULAR | Status: AC
  Filled 2017-01-17: qty 1

## 2017-01-17 MED ORDER — SODIUM CHLORIDE 0.9% FLUSH
10.0000 mL | Freq: Two times a day (BID) | INTRAVENOUS | Status: DC
  Administered 2017-01-18 – 2017-01-22 (×6): 10 mL

## 2017-01-17 MED ORDER — FENTANYL CITRATE (PF) 100 MCG/2ML IJ SOLN
25.0000 ug | INTRAMUSCULAR | Status: DC | PRN
  Administered 2017-01-19 – 2017-01-24 (×5): 25 ug via INTRAVENOUS
  Filled 2017-01-17 (×5): qty 2

## 2017-01-17 MED ORDER — METOPROLOL TARTRATE 50 MG PO TABS: 50.0000 mg | ORAL_TABLET | Freq: Two times a day (BID) | ORAL | Status: DC

## 2017-01-17 MED ORDER — FENTANYL CITRATE (PF) 100 MCG/2ML IJ SOLN
INTRAMUSCULAR | Status: AC
  Filled 2017-01-17: qty 2

## 2017-01-17 MED ORDER — PANTOPRAZOLE SODIUM 40 MG PO PACK
40.0000 mg | PACK | Freq: Two times a day (BID) | ORAL | Status: DC
  Administered 2017-01-17 – 2017-01-24 (×14): 40 mg
  Filled 2017-01-17 (×16): qty 20

## 2017-01-17 MED ORDER — VITAL AF 1.2 CAL PO LIQD
1000.0000 mL | ORAL | Status: DC
  Administered 2017-01-17 – 2017-01-18 (×2): 1000 mL

## 2017-01-17 MED ORDER — MIDAZOLAM HCL 10 MG/2ML IJ SOLN
INTRAMUSCULAR | Status: DC | PRN
  Administered 2017-01-17 (×2): 1 mg via INTRAVENOUS

## 2017-01-17 MED ORDER — CHLORHEXIDINE GLUCONATE CLOTH 2 % EX PADS
6.0000 | MEDICATED_PAD | Freq: Every day | CUTANEOUS | Status: DC
  Administered 2017-01-17 – 2017-01-21 (×3): 6 via TOPICAL

## 2017-01-17 NOTE — Progress Notes (Signed)
Background: 71 y.o.year-old with hx of ESRD sp renal transplant ([facility], 1984, pred/ imuran) Presented 11/14 w/SOB/ DOE and some mild CP. Recent new AF on BB and Eliquis. Hx of dCHF, not on diuretics PTA. Hypoxic on adm. BNP was 2000, initial Rx IV diltiazem for afib /RVR,not felt to be vol overloaded. CT angio w/ contrast no PE but RUL air space disease, pulm edema vs infection. Lactic acid 1.45, WBC 9k. Admitted, rx'd with IV diltiazem, po metoprolol, IV abx (rocephin, vanc/ zosyn) for PNA. Intubated 11/15. Admit creat 0.8, 2.2 on 11/16, 3.14 11/18 w/oliguria. Weight up 50->53 kg. CXR diffuse bilat infiltrates, R>L . Hypotension on levo gtt for about 24 hrs.BP's in low 100 range. ECHO showed normal LVEF. Asked to see for AKI.   Impression: 1. AKI - creatinine falling 2. Renal transplant - Creat normal 0.8 pre CT angio. Peak creatinine 4.3, now improving.Home meds imuran and prednisone (now at 7.5 mg). 3. Hypernatremia, improving 4. VDRF - per CCM 5. Multifocal PNA / hx pulm fibrosis and COPD  6. AF with RVR, improved rate 7. dCHF 8. Anemia -transfused, Endo done today w/ diffuse gastritis 9. Diarrhea - improved   Subjective: Interval History: Endo today  Objective: Vital signs in last 24 hours: Temp:  [96.4 F (35.8 C)-98.5 F (36.9 C)] 96.4 F (35.8 C) (11/28 1114) Pulse Rate:  [88-118] 90 (11/28 1150) Resp:  [15-32] 26 (11/28 1150) BP: (74-169)/(54-113) 93/70 (11/28 1150) SpO2:  [93 %-100 %] 99 % (11/28 1150) FiO2 (%):  [50 %-60 %] 50 % (11/28 1119) Weight:  [63.1 kg (139 lb 1.8 oz)] 63.1 kg (139 lb 1.8 oz) (11/28 0440) Weight change: 0.1 kg (3.5 oz)  Intake/Output from previous day: 11/27 0701 - 11/28 0700 In: 2516.8 [I.V.:560.8; Blood:330; NG/GT:1626] Out: 2750 [Urine:2750] Intake/Output this shift: Total I/O In: 145 [I.V.:85; NG/GT:60] Out: 470 [Urine:470]  General appearance: sedated Resp: clear to auscultation bilaterally Chest wall: no tenderness GI:  soft, non-tender; bowel sounds normal; no masses,  no organomegaly Extremities: extremities normal, atraumatic, no cyanosis or edema  Lab Results: Recent Labs    01/16/17 0733  01/17/17 0041 01/17/17 0436  WBC 16.9*  --   --  16.7*  HGB 7.4*   < > 8.1* 9.1*  HCT 23.3*   < > 25.1* 28.2*  PLT 383  --   --  389   < > = values in this interval not displayed.   BMET:  Recent Labs    01/16/17 0733 01/17/17 0436  NA 147* 146*  K 4.3 4.9  CL 113* 113*  CO2 24 26  GLUCOSE 135* 105*  BUN 106* 97*  CREATININE 2.35* 2.15*  CALCIUM 8.8* 9.1   No results for input(s): PTH in the last 72 hours. Iron Studies: No results for input(s): IRON, TIBC, TRANSFERRIN, FERRITIN in the last 72 hours. Studies/Results: Dg Chest Port 1 View  Result Date: 01/17/2017 CLINICAL DATA:  71 year old female with increasing shortness of breath, diagnosed with a fib recently. Pulmonary fibrosis. EXAM: PORTABLE CHEST 1 VIEW COMPARISON:  01/16/2017 and earlier. FINDINGS: Portable AP semi upright view at 0431 hours. Endotracheal tube tip in good position between the level of the clavicles and carina. Enteric tube courses to the abdomen, tip not included. Stable right IJ central line. The patient remains rotated to the left. Megaly. Stable lung volumes. No pneumothorax. Continued coarse bilateral interstitial opacity and dense left lung base opacity. Ventilation at the left lung base has mildly worsened since yesterday. No large pleural  effusion. IMPRESSION: 1.  Stable lines and tubes. 2. Mild worsening of ventilation at the left lung base since yesterday with continued diffuse coarse pulmonary interstitial opacity and confluent retrocardiac opacity. Electronically Signed   By: Genevie Ann M.D.   On: 01/17/2017 06:45   Dg Chest Port 1 View  Result Date: 01/16/2017 CLINICAL DATA:  71 year old female with respiratory distress. Subsequent encounter. EXAM: PORTABLE CHEST 1 VIEW COMPARISON:  01/11/2017 chest x-ray.  01/03/2017  chest CT. FINDINGS: Rotation to left. Endotracheal tube tip 3.4 cm above the carina. Right central line tip mid to distal superior vena cava level. Nasogastric tube courses below the diaphragm. Tip is not included on the present exam. Mild progression of diffuse lung disease which may represent asymmetric pulmonary edema superimposed upon chronic lung changes although difficult to exclude infectious process particularly in the left base. Bullae/ blebs projects over the heart border and is unchanged. IMPRESSION: Mild progression of diffuse lung disease which may represent asymmetric pulmonary edema superimposed upon chronic lung changes although difficult to exclude infectious process particularly in the left base. Bullae/ blebs projects over the heart border and is unchanged. Electronically Signed   By: Genia Del M.D.   On: 01/16/2017 10:48    Scheduled: . atorvastatin  20 mg Per Tube Daily  . azaTHIOprine  50 mg Oral Daily  . chlorhexidine gluconate (MEDLINE KIT)  15 mL Mouth Rinse BID  . Chlorhexidine Gluconate Cloth  6 each Topical Daily  . cycloSPORINE  1 drop Both Eyes BID  . FLUoxetine  60 mg Per Tube Daily  . free water  300 mL Per Tube Q4H  . insulin aspart  0-15 Units Subcutaneous Q4H  . insulin glargine  10 Units Subcutaneous QHS  . mouth rinse  15 mL Mouth Rinse QID  . metoprolol tartrate  100 mg Per Tube BID  . pantoprazole sodium  40 mg Per Tube BID  . predniSONE  7.5 mg Per Tube Q breakfast  . QUEtiapine  100 mg Per Tube BID  . sodium chloride flush  10-40 mL Intracatheter Q12H    LOS: 14 days   Estanislado Emms 01/17/2017,12:11 PM

## 2017-01-17 NOTE — Interval H&P Note (Signed)
History and Physical Interval Note:  01/17/2017 9:13 AM  [facility]  has presented today for surgery, with the diagnosis of GI bleed.  Dark, FOBT positive stools and anemia.  The various methods of treatment have been discussed with the patient and family. After consideration of risks, benefits and other options for treatment, the patient has consented to  Procedure(s): ESOPHAGOGASTRODUODENOSCOPY (EGD) (N/A) as a surgical intervention .  The patient's history has been reviewed, patient examined, no change in status, stable for surgery.  I have reviewed the patient's chart and labs.  Questions were answered to the patient's satisfaction.     Pricilla Riffle. Fuller Plan

## 2017-01-17 NOTE — Progress Notes (Signed)
PULMONARY / CRITICAL CARE MEDICINE   Name: Morgan Cooper MRN: [id number] DOB: May 13, 1945    ADMISSION DATE:01/03/2017  CONSULTATION DATE:01/04/17  REFERRING WS:FKCLE Phillip Heal, NP / TRH  CHIEF COMPLAINT:Acute Hypoxic Respiratory Failure   Presenting HPI:  71 y.o. female with prior history of tobacco use. Presented to hospital with acute hypoxic respiratory failure and atrial fibrillation with rapid ventricular response. Concern for possible healthcare associated pneumonia. 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 Also history of COPD with emphysema & ILD presumably from [place]. Patient has a known history of atrial fibrillation, stroke, sleep apnea, macular degeneration, diastolic congestive heart failure, and gastroparesis.  Subjective:  Patient unable to provide as intubated, agitated and attempting to remove mitten.  Review of Systems:  Unable to obtain given intubation.   Vent Mode: PCV FiO2 (%):  [40 %-60 %] 50 % Set Rate:  [26 bmp] 26 bmp PEEP:  [5 cmH20-8 cmH20] 8 cmH20 Pressure Support:  [5 cmH20] 5 cmH20 Plateau Pressure:  [14 cmH20-24 cmH20] 24 cmH20  Temp:  [96.9 F (36.1 C)-98.5 F (36.9 C)] 98.3 F (36.8 C) (11/28 0414) Pulse Rate:  [89-118] 104 (11/28 0700) Resp:  [15-32] 26 (11/28 0700) BP: (93-169)/(62-113) 127/92 (11/28 0700) SpO2:  [85 %-100 %] 99 % (11/28 0700) FiO2 (%):  [40 %-60 %] 50 % (11/28 0400) Weight:  [139 lb 1.8 oz (63.1 kg)] 139 lb 1.8 oz (63.1 kg) (11/28 0440)  Gen.: No distress. Awake. Cardiovascular: Tachycardic. Irregular rhythm. No JVD appreciated. Pulmonary: Clear breath sounds bilaterally. Symmetric chest wall rise on ventilator. Normal respiratory rate. Abdomen: Soft. Normal bowel sounds. Neurological: Attends to voice. Nods to questions. HEENT: Moist mucous membranes. No scleral icterus. Endotracheal tube in place.  LINES/TUBES: OETT 11/16 >>> R IJ CVL 11/16 >>> L RAD ART LINE  11/16 >>> Foley >>> replace 11/26 >> OGT >>> PIV  CBC Latest Ref Rng & Units 01/17/2017 01/17/2017 01/16/2017  WBC 4.0 - 10.5 K/uL 16.7(H) - -  Hemoglobin 12.0 - 15.0 g/dL 9.1(L) 8.1(L) 7.2(L)  Hematocrit 36.0 - 46.0 % 28.2(L) 25.1(L) 22.9(L)  Platelets 150 - 400 K/uL 389 - -   BMP Latest Ref Rng & Units 01/17/2017 01/16/2017 01/15/2017  Glucose 65 - 99 mg/dL 105(H) 135(H) 142(H)  BUN 6 - 20 mg/dL 97(H) 106(H) 124(H)  Creatinine 0.44 - 1.00 mg/dL 2.15(H) 2.35(H) 2.59(H)  Sodium 135 - 145 mmol/L 146(H) 147(H) 148(H)  Potassium 3.5 - 5.1 mmol/L 4.9 4.3 4.3  Chloride 101 - 111 mmol/L 113(H) 113(H) 115(H)  CO2 22 - 32 mmol/L 26 24 25   Calcium 8.9 - 10.3 mg/dL 9.1 8.8(L) 8.5(L)   Hepatic Function Latest Ref Rng & Units 01/16/2017 01/15/2017 01/14/2017  Total Protein 6.5 - 8.1 g/dL - - -  Albumin 3.5 - 5.0 g/dL 2.2(L) 2.1(L) 1.9(L)  AST 15 - 41 U/L - - -  ALT 14 - 54 U/L - - -  Alk Phosphatase 38 - 126 U/L - - -  Total Bilirubin 0.3 - 1.2 mg/dL - - -  Bilirubin, Direct <=0.2 mg/dL - - -    IMAGING/STUDIES: CTA CHEST 11/14 IMPRESSION: 1. No evidence acute pulmonary embolism. 2. Diffuse airspace disease in the RIGHT upper lobe superimposed on centrilobular emphysema. Etiology includes  Pulmonary edema versus pneumonia. 3. Coronary artery calcification 4.  Aortic Atherosclerosis (ICD10-I70.0)  5.  Emphysema (ICD10-J43.9). TTE 11/15:  LV normal in size w/ EF 55-60%. LA moderately dilated & RA moderately dilated. RV mildly dilated with preserved systolic function.  Trivial aortic regurgitation. Trivial mitral regurgitation without stenosis. No pulmonic regurgitation. Mild tricuspid regurgitation. No pericardial effusion. CT ABD/PELVIS W/O 11/19: IMPRESSION: 1. Study markedly degraded by patient motion and limited by lack of intravenous contrast material. 2. Gallbladder is distended with layering sludge or tiny stones. There appears to be pericholecystic fluid although assessment is  hindered by the motion artifact. Abdominal ultrasound may prove helpful to further evaluate. 3. 5 x 3 cm apparent fluid collection in the distal pancreatectomy bed at the site of the previous drainage catheter. This is not well evaluated given the above limitations, but seroma, hematoma, or abscess could have this appearance. 4. Lung bases appear better aerated than CT scan of 01/03/2017 although there is persistent bilateral lower lung  ground-glass attenuation potentially rated to pulmonary edema or diffuse infection. 5. New focal opacity measuring 2 cm in the left base. Given that this was not present on the study from 5 days ago, it  probably represents atelectasis or infection. 6. Similar appearance 4.3 cm abdominal aortic aneurysm. 7. No overt hydronephrosis and left pelvic transplant kidney. Gas in the bladder lumen presumably related to the  presence of a Foley catheter. 8. Small volume intraperitoneal free fluid with diffuse body wall edema. PORT CXR 11/19: Endotracheal tube in good position. Enteric feeding tube coursing below the diaphragm. No new focal opacity appreciated. PORT CXR 11/21:   Significant rotation of the chest film to the left. Questionable silhouetting of extreme lateral left hemidiaphragm. No new focal opacity appreciated. Endotracheal tube in good position. PORT CXR 11/22:   Endotracheal tube in good position. Enteric feeding tube coursing below diaphragm. Film rotated to the left. No obvious new focal opacity. Silhouetting of left hemidiaphragm could be due to atelectasis or evolving infiltrate.  MICROBIOLOGY: MRSA PCR 11/14:  Negative Blood Cultures x2 11/14:  Negative Tracheal Aspirate Culture 11/16:  Normal Respiratory Flora  Respiratory Panel PCR 11/15:  Negative  Influenza A/B PCR  11/14:  Negative  Urine Streptococcal Antigen 11/14:  Negative  Urine Legionella Antigen 11/14:  Negative   ANTIBIOTICS: Azithromycin 11/14 - 11/15 Rocephin 11/14 -  11/15 Vancomycin 11/14 - 11/18 Zosyn 11/15 - 11/22  SIGNIFICANT EVENTS: 11/14 - Admit 11/16 - Transfer to ICU with worsening respiratory status 11/18 - Changed to pressure control ventilation  11/20 - Didn't tolerate attempt to switch back from Pressure Control to PRVC. Heparin drip stopped w/ anemia.  11/21 - Started Seroquel 48m BID.  11/23 - More calm and awake today. Tolerating PS 5/5 today w/ TV >800cc.  11/25 - Following commands today & more calm. Tolerating PS 5/5 again today. 11/26 - replacing foley, mild abdominal distention 11/28 - plan for upper endoscopy   ASSESSMENT/PLAN:  71y.o.  female with chronic immunosuppression. Renal function continues to improve. Urine output remained stable. Mental status is progressively improving as well with treatment of her underlying delirium. She is tolerating pressure support weaning.  1. Acute on chronic hypoxic respiratory failure: Continuing daily pressure support wean and spontaneous breathing trials. Continuous pulse oximetry monitoring. Continue ventilatory support pending endoscopy.  2. Healthcare associated pneumonia/aspiration pneumonia: Status post treatment of seven-day course with Zosyn. Plan to repeat culture for fever. 3. Leukocytosis: WBC increasing slowly x 2 days. Patient gets recurrent UTIs at home. UA sent.  4. Acute encephalopathy/delirium: Steadily improving. Seroquel to 100 mg via tube twice a day. Continuing to limit sedation/benzodiazepines. Monitoring QTC daily on EKG. Restarted home prozac per tube.  5. Atrial fibrillation: Management per cardiology. Currently on  Lopressor. Slowly decreasing dose of diltiazem. Holding heparin drip and subq given melena and anemia. 6. Anemia: Suspect secondary to GI loss with melena.  Continuing Protonix IV every 12 hours. Trending hemoglobin daily with CBC. GI consulted 11/27, plan for upper endoscopy 11/28. S/p 1U PRBC 11/27. 7. Acute renal failure: Improving. Nephrology following.  Trending electrolytes and renal function daily. Monitor urine output with Foley catheter. 2.75L out last 24 hours. 8. Hypernatremia: Improving. Monitoring electrolytes daily. 9. Azotemia: Improving. Multifactorial from steroids and renal failure, likely upper GIB. 10. Adrenal insufficiency/chronic prednisone therapy: Continuing Prednisone 7.49m daily.  11. Hyperglycemia: Stable and improving as steroids have tapered. No history of diabetes mellitus. Lantus 15U QHS. Continuing sliding scale insulin with Accu-Cheks every 4 hours and moderate algorithm.  12. Acute on chronic diastolic congestive heart failure: Holding on diuresis given acute renal failure. Continuing Lipitor. 13. Severe protein-calorie malnutrition: Continuing tube feeds per dietary recommendations. 14. COPD: No signs of acute exacerbation. Continuing Duonebs every 6 hours. 15. ILD: Reportedly secondary to [place] No signs of acute flare.  Prophylaxis: SCDs, & Protonix IV q12hr.  Diet:  NPO. Tube feedings per dietary recommendations. Code Status:  Full Code. Disposition:  Remains critically ill in the ICU. Family Update:  Daughter updated during rounds today.  KRalene Ok MD PGY 2 Family Medicine 7:06 AM

## 2017-01-17 NOTE — Op Note (Addendum)
[facility] Patient Name: Morgan Cooper Procedure Date : 01/17/2017 MRN: [id number] Attending MD: Ladene Artist , MD Date of Birth: Dec 21, 1945 CSN: [id number] Age: 71 Admit Type: Inpatient Procedure:                Upper GI endoscopy Indications:              Melena Providers:                Pricilla Riffle. Fuller Plan, MD, Zenon Mayo, RN, Elspeth Cho, Technician Referring MD:             Marshell Garfinkel, MD Medicines:                Midazolam 2 mg IV Complications:            No immediate complications. Estimated Blood Loss:     Estimated blood loss: none. Procedure:                Pre-Anesthesia Assessment:                           - Prior to the procedure, a History and Physical                            was performed, and patient medications and                            allergies were reviewed. The patient's tolerance of                            previous anesthesia was also reviewed. The risks                            and benefits of the procedure and the sedation                            options and risks were discussed with the patient.                            All questions were answered, and informed consent                            was obtained. Prior Anticoagulants: The patient has                            taken no previous anticoagulant or antiplatelet                            agents. ASA Grade Assessment: III - A patient with                            severe systemic disease. After reviewing the risks  and benefits, the patient was deemed in                            satisfactory condition to undergo the procedure.                           After obtaining informed consent, the endoscope was                            passed under direct vision. Throughout the                            procedure, the patient's blood pressure, pulse, and                            oxygen saturations were  monitored continuously. The                            [id number] ([id number]) scope was introduced through the                            mouth, and advanced to the second part of duodenum.                            The upper GI endoscopy was accomplished without                            difficulty. The patient tolerated the procedure                            well. Scope In: Scope Out: Findings:      The examined esophagus was normal. NGT in place.      Diffuse moderate inflammation characterized by friability and       granularity was found in the entire examined stomach. Known history of       atrophic gastritis. NGT in place with tip in gastric body.      The exam of the stomach was otherwise normal.      The duodenal bulb was normal. Mild erythema in 2nd duodenum. Impression:               - Normal esophagus.                           - Moderate diffuse gastritis.                           - Normal duodenal bulb.                           - Mild erythema in the second portion of the                            duodenum.                           - No specimens  collected. Moderate Sedation:      Moderate (conscious) sedation was administered by the endoscopy nurse       and supervised by the endoscopist. The following parameters were       monitored: oxygen saturation, heart rate, blood pressure, respiratory       rate, EKG, adequacy of pulmonary ventilation, and response to care.       Total physician intraservice time was 12 minutes. Recommendation:           - Gastritis is likely source of GI bleed.                           - Careful use of anticoagulants, if they are needed.                           - Return patient to ICU for ongoing care.                           - OK for enteral tube feedings.                           - Continue present medications. OK to change PPI to                            per NGT bid.                           - No aspirin, ibuprofen, naproxen,  or other                            non-steroidal anti-inflammatory drugs. Procedure Code(s):        --- Professional ---                           [phone], Esophagogastroduodenoscopy, flexible,                            transoral; diagnostic, including collection of                            specimen(s) by brushing or washing, when performed                            (separate procedure)                           99152, Moderate sedation services provided by the                            same physician or other qualified health care                            professional performing the diagnostic or                            therapeutic service that the sedation supports,  requiring the presence of an independent trained                            observer to assist in the monitoring of the                            patient's level of consciousness and physiological                            status; initial 15 minutes of intraservice time,                            patient age 81 years or older Diagnosis Code(s):        --- Professional ---                           K29.70, Gastritis, unspecified, without bleeding                           K92.1, Melena (includes Hematochezia) CPT copyright 2016 American Medical Association. All rights reserved. The codes documented in this report are preliminary and upon coder review may  be revised to meet current compliance requirements. Ladene Artist, MD 01/17/2017 10:11:23 AM This report has been signed electronically. Number of Addenda: 0

## 2017-01-18 ENCOUNTER — Encounter (HOSPITAL_COMMUNITY): Payer: Self-pay | Admitting: Gastroenterology

## 2017-01-18 ENCOUNTER — Inpatient Hospital Stay (HOSPITAL_COMMUNITY): Payer: Medicare Other

## 2017-01-18 LAB — GLUCOSE, CAPILLARY
Glucose-Capillary: 191 mg/dL — ABNORMAL HIGH (ref 65–99)
Glucose-Capillary: 139 mg/dL — ABNORMAL HIGH (ref 65–99)
Glucose-Capillary: 135 mg/dL — ABNORMAL HIGH (ref 65–99)
Glucose-Capillary: 154 mg/dL — ABNORMAL HIGH (ref 65–99)
Glucose-Capillary: 154 mg/dL — ABNORMAL HIGH (ref 65–99)

## 2017-01-18 LAB — BASIC METABOLIC PANEL
Anion gap: 9 (ref 5–15)
BUN: 96 mg/dL — AB (ref 6–20)
CO2: 25 mmol/L (ref 22–32)
CREATININE: 2.19 mg/dL — AB (ref 0.44–1.00)
Calcium: 8.6 mg/dL — ABNORMAL LOW (ref 8.9–10.3)
Chloride: 110 mmol/L (ref 101–111)
GFR calc Af Amer: 25 mL/min — ABNORMAL LOW (ref >60)
GFR, EST NON AFRICAN AMERICAN: 21 mL/min — AB (ref >60)
Glucose, Bld: 174 mg/dL — ABNORMAL HIGH (ref 65–99)
Potassium: 4.7 mmol/L (ref 3.5–5.1)
SODIUM: 144 mmol/L (ref 135–145)

## 2017-01-18 LAB — CBC
HCT: 28.2 % — ABNORMAL LOW (ref 36.0–46.0)
Hemoglobin: 8.9 g/dL — ABNORMAL LOW (ref 12.0–15.0)
MCH: 29.9 pg (ref 26.0–34.0)
MCHC: 31.6 g/dL (ref 30.0–36.0)
MCV: 94.6 fL (ref 78.0–100.0)
PLATELETS: 331 10*3/uL (ref 150–400)
RBC: 2.98 MIL/uL — ABNORMAL LOW (ref 3.87–5.11)
RDW: 16.8 % — AB (ref 11.5–15.5)
WBC: 13.6 10*3/uL — ABNORMAL HIGH (ref 4.0–10.5)

## 2017-01-18 MED ORDER — DILTIAZEM 12 MG/ML ORAL SUSPENSION
45.0000 mg | Freq: Four times a day (QID) | ORAL | Status: DC
  Administered 2017-01-18 – 2017-01-19 (×4): 45 mg via ORAL
  Filled 2017-01-18 (×5): qty 6

## 2017-01-18 MED ORDER — FUROSEMIDE 10 MG/ML IJ SOLN
40.0000 mg | Freq: Two times a day (BID) | INTRAMUSCULAR | Status: AC
  Administered 2017-01-18 (×2): 40 mg via INTRAVENOUS
  Filled 2017-01-18 (×2): qty 4

## 2017-01-18 NOTE — Progress Notes (Signed)
Progress Note  Patient Name: Morgan Cooper Date of Encounter: 01/18/2017  Primary Cardiologist: Dr Irish Lack  Subjective   Intubated; eyes opened, appears anxious  Inpatient Medications    Scheduled Meds: . atorvastatin  20 mg Per Tube Daily  . azaTHIOprine  50 mg Oral Daily  . chlorhexidine gluconate (MEDLINE KIT)  15 mL Mouth Rinse BID  . Chlorhexidine Gluconate Cloth  6 each Topical Daily  . cycloSPORINE  1 drop Both Eyes BID  . diltiazem  45 mg Oral Q6H  . FLUoxetine  60 mg Per Tube Daily  . free water  300 mL Per Tube Q4H  . furosemide  40 mg Intravenous BID  . insulin aspart  0-15 Units Subcutaneous Q4H  . insulin glargine  10 Units Subcutaneous QHS  . mouth rinse  15 mL Mouth Rinse QID  . metoprolol tartrate  100 mg Per Tube BID  . pantoprazole sodium  40 mg Per Tube BID  . predniSONE  7.5 mg Per Tube Q breakfast  . QUEtiapine  100 mg Per Tube BID  . sodium chloride flush  10-40 mL Intracatheter Q12H   Continuous Infusions: . sodium chloride 10 mL/hr at 01/18/17 0900  . feeding supplement (VITAL AF 1.2 CAL) Stopped (01/18/17 0800)  . fentaNYL infusion INTRAVENOUS Stopped (01/18/17 0701)   PRN Meds: Place/Maintain arterial line **AND** sodium chloride, fentaNYL, fentaNYL (SUBLIMAZE) injection, levalbuterol, midazolam, ondansetron (ZOFRAN) IV, sodium chloride flush   Vital Signs    Vitals:   01/18/17 0737 01/18/17 0800 01/18/17 0835 01/18/17 0900  BP:  (!) 147/106  (!) 139/104  Pulse: (!) 114 (!) 115  (!) 104  Resp:  (!) 23  (!) 22  Temp:   98.8 F (37.1 C)   TempSrc:   Oral   SpO2: 98% 95%  94%  Weight:      Height:        Intake/Output Summary (Last 24 hours) at 01/18/2017 1121 Last data filed at 01/18/2017 0900 Gross per 24 hour  Intake 1773.79 ml  Output 3210 ml  Net -1436.21 ml   Filed Weights   01/16/17 0400 01/17/17 0440 01/18/17 0500  Weight: 138 lb 14.2 oz (63 kg) 139 lb 1.8 oz (63.1 kg) 137 lb 9.1 oz (62.4 kg)    Telemetry      Atrial fibrillation with RVR- Personally Reviewed  Physical Exam   GEN: WD/frail; intubated; shakes head to questions Neck: supple Cardiac: irregular, tachycardic Respiratory: CTA anteriorly, crackles B/L GI: ND, no masses MS: trace edema Neuro:  Intubated; shakes head to question; moves ext  Labs    Chemistry Recent Labs  Lab 01/14/17 0505 01/15/17 0358 01/16/17 0733 01/17/17 0436 01/18/17 0254  NA 150* 148* 147* 146* 144  K 3.2* 4.3 4.3 4.9 4.7  CL 114* 115* 113* 113* 110  CO2 25 25 24 26 25   GLUCOSE 139* 142* 135* 105* 174*  BUN 145* 124* 106* 97* 96*  CREATININE 2.89* 2.59* 2.35* 2.15* 2.19*  CALCIUM 8.4* 8.5* 8.8* 9.1 8.6*  ALBUMIN 1.9* 2.1* 2.2*  --   --   GFRNONAA 15* 17* 20* 22* 21*  GFRAA 18* 20* 23* 25* 25*  ANIONGAP 11 8 10 7 9      Hematology Recent Labs  Lab 01/16/17 0733  01/17/17 0041 01/17/17 0436 01/18/17 0254  WBC 16.9*  --   --  16.7* 13.6*  RBC 2.42*  --   --  3.02* 2.98*  HGB 7.4*   < > 8.1* 9.1* 8.9*  HCT 23.3*   < > 25.1* 28.2* 28.2*  MCV 96.3  --   --  93.4 94.6  MCH 30.6  --   --  30.1 29.9  MCHC 31.8  --   --  32.3 31.6  RDW 16.2*  --   --  17.6* 16.8*  PLT 383  --   --  389 331   < > = values in this interval not displayed.        Radiology    Dg Chest Port 1 View  Result Date: 01/18/2017 CLINICAL DATA:  Respiratory distress. EXAM: PORTABLE CHEST 1 VIEW COMPARISON:  01/09/2017 FINDINGS: The heart is mildly enlarged but stable. Stable tortuosity and calcification of the thoracic aorta. Chronic underlying emphysematous changes with superimposed bilateral infiltrates and left pleural effusion. Stable cystic air space at the left lung base. The endotracheal tube is 3.4 cm above the carina. The NG tube is coursing down the esophagus and into the stomach. The right IJ central venous catheter has been removed. IMPRESSION: 1. The ET and NG tubes are stable. The right IJ central venous catheter has been removed. 2. Persistent diffuse  airspace process and left effusion. Electronically Signed   By: Marijo Sanes M.D.   On: 01/18/2017 10:17   Dg Chest Port 1 View  Result Date: 01/17/2017 CLINICAL DATA:  71 year old female with increasing shortness of breath, diagnosed with a fib recently. Pulmonary fibrosis. EXAM: PORTABLE CHEST 1 VIEW COMPARISON:  01/16/2017 and earlier. FINDINGS: Portable AP semi upright view at 0431 hours. Endotracheal tube tip in good position between the level of the clavicles and carina. Enteric tube courses to the abdomen, tip not included. Stable right IJ central line. The patient remains rotated to the left. Megaly. Stable lung volumes. No pneumothorax. Continued coarse bilateral interstitial opacity and dense left lung base opacity. Ventilation at the left lung base has mildly worsened since yesterday. No large pleural effusion. IMPRESSION: 1.  Stable lines and tubes. 2. Mild worsening of ventilation at the left lung base since yesterday with continued diffuse coarse pulmonary interstitial opacity and confluent retrocardiac opacity. Electronically Signed   By: Genevie Ann M.D.   On: 01/17/2017 06:45    Patient Profile     71 y.o. female with new onset atrial fibrillation with rapid ventricular response, possible pneumonia and acute on chronic kidney disease. Echocardiogram this admission shows normal LV systolic function, moderate biatrial enlargement and mild right ventricular enlargement.   Assessment & Plan    1 atrial fibrillation with rapid ventricular response-Heart rate elevated.  - increase cardizem to 10 mg/hr and potentially to 15 mg/hr if tolerated by BP - agree with metoprolol 100 mg po daily - Heparin DCed due to concern for bleeding.   2 chronic diastolic congestive heart failure-diuretics on hold.  - agree with Lasix 40 mg iv BID, crea 2.1 stable  3 possible pneumonia-management per CCM.  4 acute kidney disease with prior renal transplant-Nephrology following.  5 VDRF-per CCM  6  AAA-will need fu as outpt  For questions or updates, please contact [place] Please consult [email or web address] for contact info under Cardiology/STEMI.   Signed, Ena Dawley, MD  01/18/2017, 11:21 AM

## 2017-01-18 NOTE — Care Management Note (Signed)
Case Management Note  Patient Details  Name: CHARNELL PEPLINSKI MRN: [id number] Date of Birth: 06/07/1945  Subjective/Objective:   Pt admitted with SOB and DOE                  Action/Plan:  PTA from home, ESRD pt s/p renal transplant.  Pt remains intubated - may receive trach and at that time will be appropriate for [facility] referral per attending (physician advisor is in agreement)  CM will continue to follow for discharge needs   Expected Discharge Date:                  Expected Discharge Plan:  Long Term Acute Care (LTAC)  In-House Referral:  Clinical Social Work  Discharge planning Services  CM Consult  Post Acute Care Choice:    Choice offered to:     DME Arranged:    DME Agency:     HH Arranged:    [place] Agency:     Status of Service:     If discussed at [employer], dates discussed:    Additional Comments:  Maryclare Labrador, RN 01/18/2017, 6:07 PM

## 2017-01-18 NOTE — Progress Notes (Signed)
PULMONARY / CRITICAL CARE MEDICINE   Name: Morgan Cooper MRN: [id number] DOB: Feb 20, 1946    ADMISSION DATE:01/03/2017  CONSULTATION DATE:01/04/17  REFERRING QI:ONGEX Phillip Heal, NP / TRH  CHIEF COMPLAINT:Acute Hypoxic Respiratory Failure   Presenting HPI:  71 y.o. female with prior history of tobacco use. Presented to hospital with acute hypoxic respiratory failure and atrial fibrillation with rapid ventricular response. Concern for possible healthcare associated pneumonia. 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 Also history of COPD with emphysema & ILD presumably from [place]. Patient has a known history of atrial fibrillation, stroke, sleep apnea, macular degeneration, diastolic congestive heart failure, and gastroparesis.  Subjective:  Patient unable to provide as intubated, agitated.  Review of Systems:  Unable to obtain given intubation.   Vent Mode: PCV FiO2 (%):  [50 %] 50 % Set Rate:  [26 bmp] 26 bmp PEEP:  [5 BMW41-3 cmH20] 5 cmH20 Plateau Pressure:  [18 cmH20-25 cmH20] 20 cmH20  Temp:  [96.4 F (35.8 C)-98.5 F (36.9 C)] 98.4 F (36.9 C) (11/29 0359) Pulse Rate:  [88-120] 120 (11/29 0700) Resp:  [17-33] 28 (11/29 0700) BP: (74-149)/(51-101) 144/101 (11/29 0700) SpO2:  [94 %-100 %] 98 % (11/29 0700) FiO2 (%):  [50 %] 50 % (11/29 0351) Weight:  [137 lb 9.1 oz (62.4 kg)] 137 lb 9.1 oz (62.4 kg) (11/29 0500)  Gen.: No distress. Awake. Cardiovascular: Tachycardic. Irregular rhythm.  Pulmonary: Clear breath sounds bilaterally. Symmetric chest wall rise on ventilator. Normal respiratory rate. Abdomen: Soft. Normal bowel sounds. Neurological: Attends to voice. Nods to questions. HEENT: Moist mucous membranes. No scleral icterus. Endotracheal tube in place.  LINES/TUBES: OETT 11/16 >>> R IJ CVL 11/16 >>> L RAD ART LINE 11/16 >>> Foley >>> replace 11/26 >> OGT >>> PIV  CBC Latest Ref Rng & Units 01/18/2017  01/17/2017 01/17/2017  WBC 4.0 - 10.5 K/uL 13.6(H) 16.7(H) -  Hemoglobin 12.0 - 15.0 g/dL 8.9(L) 9.1(L) 8.1(L)  Hematocrit 36.0 - 46.0 % 28.2(L) 28.2(L) 25.1(L)  Platelets 150 - 400 K/uL 331 389 -   BMP Latest Ref Rng & Units 01/18/2017 01/17/2017 01/16/2017  Glucose 65 - 99 mg/dL 174(H) 105(H) 135(H)  BUN 6 - 20 mg/dL 96(H) 97(H) 106(H)  Creatinine 0.44 - 1.00 mg/dL 2.19(H) 2.15(H) 2.35(H)  Sodium 135 - 145 mmol/L 144 146(H) 147(H)  Potassium 3.5 - 5.1 mmol/L 4.7 4.9 4.3  Chloride 101 - 111 mmol/L 110 113(H) 113(H)  CO2 22 - 32 mmol/L 25 26 24   Calcium 8.9 - 10.3 mg/dL 8.6(L) 9.1 8.8(L)   Hepatic Function Latest Ref Rng & Units 01/16/2017 01/15/2017 01/14/2017  Total Protein 6.5 - 8.1 g/dL - - -  Albumin 3.5 - 5.0 g/dL 2.2(L) 2.1(L) 1.9(L)  AST 15 - 41 U/L - - -  ALT 14 - 54 U/L - - -  Alk Phosphatase 38 - 126 U/L - - -  Total Bilirubin 0.3 - 1.2 mg/dL - - -  Bilirubin, Direct <=0.2 mg/dL - - -    IMAGING/STUDIES: CTA CHEST 11/14 IMPRESSION: 1. No evidence acute pulmonary embolism. 2. Diffuse airspace disease in the RIGHT upper lobe superimposed on centrilobular emphysema. Etiology includes  Pulmonary edema versus pneumonia. 3. Coronary artery calcification 4.  Aortic Atherosclerosis (ICD10-I70.0)  5.  Emphysema (ICD10-J43.9). TTE 11/15:  LV normal in size w/ EF 55-60%. LA moderately dilated & RA moderately dilated. RV mildly dilated with preserved systolic function. Trivial aortic regurgitation. Trivial mitral regurgitation without stenosis. No pulmonic regurgitation. Mild tricuspid regurgitation. No pericardial  effusion. CT ABD/PELVIS W/O 11/19: IMPRESSION: 1. Study markedly degraded by patient motion and limited by lack of intravenous contrast material. 2. Gallbladder is distended with layering sludge or tiny stones. There appears to be pericholecystic fluid although assessment is hindered by the motion artifact. Abdominal ultrasound may prove helpful to further  evaluate. 3. 5 x 3 cm apparent fluid collection in the distal pancreatectomy bed at the site of the previous drainage catheter. This is not well evaluated given the above limitations, but seroma, hematoma, or abscess could have this appearance. 4. Lung bases appear better aerated than CT scan of 01/03/2017 although there is persistent bilateral lower lung  ground-glass attenuation potentially rated to pulmonary edema or diffuse infection. 5. New focal opacity measuring 2 cm in the left base. Given that this was not present on the study from 5 days ago, it  probably represents atelectasis or infection. 6. Similar appearance 4.3 cm abdominal aortic aneurysm. 7. No overt hydronephrosis and left pelvic transplant kidney. Gas in the bladder lumen presumably related to the  presence of a Foley catheter. 8. Small volume intraperitoneal free fluid with diffuse body wall edema. PORT CXR 11/19: Endotracheal tube in good position. Enteric feeding tube coursing below the diaphragm. No new focal opacity appreciated. PORT CXR 11/21:   Significant rotation of the chest film to the left. Questionable silhouetting of extreme lateral left hemidiaphragm. No new focal opacity appreciated. Endotracheal tube in good position. PORT CXR 11/22:   Endotracheal tube in good position. Enteric feeding tube coursing below diaphragm. Film rotated to the left. No obvious new focal opacity. Silhouetting of left hemidiaphragm could be due to atelectasis or evolving infiltrate.  MICROBIOLOGY: MRSA PCR 11/14:  Negative Blood Cultures x2 11/14:  Negative Tracheal Aspirate Culture 11/16:  Normal Respiratory Flora  Respiratory Panel PCR 11/15:  Negative  Influenza A/B PCR  11/14:  Negative  Urine Streptococcal Antigen 11/14:  Negative  Urine Legionella Antigen 11/14:  Negative   ANTIBIOTICS: Azithromycin 11/14 - 11/15 Rocephin 11/14 - 11/15 Vancomycin 11/14 - 11/18 Zosyn 11/15 - 11/22  SIGNIFICANT EVENTS: 11/14 -  Admit 11/16 - Transfer to ICU with worsening respiratory status 11/18 - Changed to pressure control ventilation  11/20 - Didn't tolerate attempt to switch back from Pressure Control to PRVC. Heparin drip stopped w/ anemia.  11/21 - Started Seroquel 23m BID.  11/23 - More calm and awake today. Tolerating PS 5/5 today w/ TV >800cc.  11/25 - Following commands today & more calm. Tolerating PS 5/5 again today. 11/26 - replacing foley, mild abdominal distention 11/28 - upper endoscopy with moderate gastritis  ASSESSMENT/PLAN:  71y.o.  female with chronic immunosuppression. Renal function continues to improve. Urine output remained stable. Mental status is progressively improving as well with treatment of her underlying delirium. She is tolerating pressure support weaning.  1. Acute on chronic hypoxic respiratory failure: Continuing daily pressure support wean and spontaneous breathing trials. Continuous pulse oximetry monitoring.  2. Healthcare associated pneumonia/aspiration pneumonia: Status post treatment of seven-day course with Zosyn. Plan to repeat culture for fever. 3. Acute encephalopathy/delirium: Steadily improving. Seroquel to 100 mg via tube twice a day. Continuing to limit sedation/benzodiazepines. Monitoring QTC daily on EKG. Restarted home prozac per tube.  4. Atrial fibrillation: Management per cardiology. Currently on Lopressor. Slowly decreasing dose of diltiazem. Holding heparin drip and subq given melena and anemia. 5. Anemia: Suspect secondary to GI loss with melena.  Protonix per tube every 12 hours. Trending hemoglobin daily with CBC. Upper endoscopy  11/28 with significant gastritis. S/p 1U PRBC 11/27. 6. Acute renal failure: Stable. Nephrology following. Trending electrolytes and renal function daily. Monitor urine output with Foley catheter. 3.5L out last 24 hours. 7. Azotemia: Stable. Multifactorial from steroids and renal failure, likely upper GIB. 8. Adrenal  insufficiency/chronic prednisone therapy: Continuing Prednisone 7.64m daily.  9. Hyperglycemia: Stable and improving as steroids have tapered. No history of diabetes mellitus. Lantus 10U QHS. Continuing sliding scale insulin with Accu-Cheks every 4 hours and moderate algorithm.  10. Acute on chronic diastolic congestive heart failure:  Continuing Lipitor. S/p IV lasix 417monce 11/28.  11. Severe protein-calorie malnutrition: Continuing tube feeds per dietary recommendations. 12. COPD: No signs of acute exacerbation. 13. ILD: Reportedly secondary to [place] signs of acute flare.  Prophylaxis: SCDs, & Protonix per tube q12hr.  Diet:  NPO. Tube feedings per dietary recommendations. Code Status:  Full Code. Disposition:  Remains critically ill in the ICU. Family Update:  Daughter updated during rounds today.  KaRalene OkMD PGY 2 Family Medicine 7:21 AM

## 2017-01-18 NOTE — Progress Notes (Signed)
71 y.o.year-old with hx of ESRD sp renal transplant (LRD, 1984, pred/ imuran) Presented 11/14 w/SOB/ DOE and some mild CP. Recent new AF on BB and Eliquis. Hx of dCHF, not on diuretics PTA. Hypoxic on adm. BNP was 2000, initial Rx IV diltiazem for afib /RVR,not felt to be vol overloaded. CT angio w/ contrast no PE but RUL air space disease, pulm edema vs infection. Lactic acid 1.45, WBC 9k. Admitted, rx'd with IV diltiazem, po metoprolol, IV abx (rocephin, vanc/ zosyn) for PNA. Intubated 11/15. Admit creat 0.8, 2.2 on 11/16, 3.14 11/18 w/oliguria. Weight up 50->53 kg. CXR diffuse bilat infiltrates, R>L . Hypotension on levo gtt for about 24 hrs.BP's in low 100 range. ECHO showed normal LVEF. Asked to see for AKI.   Impression: 1. AKI -creatinine stalled 2. Renal transplant - Creat normal 0.8. Peak creatinine 4.3.Home meds imuran and prednisone (now at 7.5 mg). 3. Hypernatremia, improving 4. VDRF - per CCM 5. Multifocal PNA / hx pulm fibrosis and COPD  6. AF with RVR, recurrent 7. DCHF, getting diuresed 8. Anemia -transfused, Endo done w/ diffuse gastritis 9. Diarrhea - resolved  Subjective: Interval History: failed ext trial yes, rapid afib  Objective: Vital signs in last 24 hours: Temp:  [97.8 F (36.6 C)-98.8 F (37.1 C)] 98 F (36.7 C) (11/29 1202) Pulse Rate:  [94-121] 109 (11/29 1125) Resp:  [18-33] 26 (11/29 1125) BP: (83-149)/(51-106) 139/104 (11/29 0900) SpO2:  [94 %-100 %] 97 % (11/29 1125) FiO2 (%):  [40 %-50 %] 40 % (11/29 1125) Weight:  [62.4 kg (137 lb 9.1 oz)] 62.4 kg (137 lb 9.1 oz) (11/29 0500) Weight change: -0.7 kg (-8.7 oz)  Intake/Output from previous day: 11/28 0701 - 11/29 0700 In: 1538.8 [I.V.:433.8; NG/GT:1105] Out: 3570 [Urine:3570] Intake/Output this shift: Total I/O In: 330 [I.V.:30; NG/GT:300] Out: 110 [Urine:110]  General appearance: sedated Resp: ant clear Cardio: irregularly irregular rhythm Extremities: no edema at knees  Lab  Results: Recent Labs    01/17/17 0436 01/18/17 0254  WBC 16.7* 13.6*  HGB 9.1* 8.9*  HCT 28.2* 28.2*  PLT 389 331   BMET:  Recent Labs    01/17/17 0436 01/18/17 0254  NA 146* 144  K 4.9 4.7  CL 113* 110  CO2 26 25  GLUCOSE 105* 174*  BUN 97* 96*  CREATININE 2.15* 2.19*  CALCIUM 9.1 8.6*   No results for input(s): PTH in the last 72 hours. Iron Studies: No results for input(s): IRON, TIBC, TRANSFERRIN, FERRITIN in the last 72 hours. Studies/Results: Dg Chest Port 1 View  Result Date: 01/18/2017 CLINICAL DATA:  Respiratory distress. EXAM: PORTABLE CHEST 1 VIEW COMPARISON:  01/09/2017 FINDINGS: The heart is mildly enlarged but stable. Stable tortuosity and calcification of the thoracic aorta. Chronic underlying emphysematous changes with superimposed bilateral infiltrates and left pleural effusion. Stable cystic air space at the left lung base. The endotracheal tube is 3.4 cm above the carina. The NG tube is coursing down the esophagus and into the stomach. The right IJ central venous catheter has been removed. IMPRESSION: 1. The ET and NG tubes are stable. The right IJ central venous catheter has been removed. 2. Persistent diffuse airspace process and left effusion. Electronically Signed   By: Marijo Sanes M.D.   On: 01/18/2017 10:17   Dg Chest Port 1 View  Result Date: 01/17/2017 CLINICAL DATA:  71 year old female with increasing shortness of breath, diagnosed with a fib recently. Pulmonary fibrosis. EXAM: PORTABLE CHEST 1 VIEW COMPARISON:  01/16/2017 and earlier. FINDINGS:  Portable AP semi upright view at 0431 hours. Endotracheal tube tip in good position between the level of the clavicles and carina. Enteric tube courses to the abdomen, tip not included. Stable right IJ central line. The patient remains rotated to the left. Megaly. Stable lung volumes. No pneumothorax. Continued coarse bilateral interstitial opacity and dense left lung base opacity. Ventilation at the left lung  base has mildly worsened since yesterday. No large pleural effusion. IMPRESSION: 1.  Stable lines and tubes. 2. Mild worsening of ventilation at the left lung base since yesterday with continued diffuse coarse pulmonary interstitial opacity and confluent retrocardiac opacity. Electronically Signed   By: Genevie Ann M.D.   On: 01/17/2017 06:45    Scheduled: . atorvastatin  20 mg Per Tube Daily  . azaTHIOprine  50 mg Oral Daily  . chlorhexidine gluconate (MEDLINE KIT)  15 mL Mouth Rinse BID  . Chlorhexidine Gluconate Cloth  6 each Topical Daily  . cycloSPORINE  1 drop Both Eyes BID  . diltiazem  45 mg Oral Q6H  . FLUoxetine  60 mg Per Tube Daily  . free water  300 mL Per Tube Q4H  . furosemide  40 mg Intravenous BID  . insulin aspart  0-15 Units Subcutaneous Q4H  . insulin glargine  10 Units Subcutaneous QHS  . mouth rinse  15 mL Mouth Rinse QID  . metoprolol tartrate  100 mg Per Tube BID  . pantoprazole sodium  40 mg Per Tube BID  . predniSONE  7.5 mg Per Tube Q breakfast  . QUEtiapine  100 mg Per Tube BID  . sodium chloride flush  10-40 mL Intracatheter Q12H    LOS: 15 days   Estanislado Emms 01/18/2017,12:51 PM

## 2017-01-18 NOTE — Progress Notes (Signed)
Patient placed back to full vent support by RN per MD at [phone].

## 2017-01-19 ENCOUNTER — Inpatient Hospital Stay (HOSPITAL_COMMUNITY): Payer: Medicare Other

## 2017-01-19 LAB — BASIC METABOLIC PANEL
ANION GAP: 11 (ref 5–15)
BUN: 85 mg/dL — ABNORMAL HIGH (ref 6–20)
CHLORIDE: 104 mmol/L (ref 101–111)
CO2: 25 mmol/L (ref 22–32)
CREATININE: 2.13 mg/dL — AB (ref 0.44–1.00)
Calcium: 8.5 mg/dL — ABNORMAL LOW (ref 8.9–10.3)
GFR calc non Af Amer: 22 mL/min — ABNORMAL LOW (ref >60)
GFR, EST AFRICAN AMERICAN: 26 mL/min — AB (ref >60)
Glucose, Bld: 159 mg/dL — ABNORMAL HIGH (ref 65–99)
POTASSIUM: 4.1 mmol/L (ref 3.5–5.1)
SODIUM: 140 mmol/L (ref 135–145)

## 2017-01-19 LAB — CBC
HEMATOCRIT: 27.7 % — AB (ref 36.0–46.0)
HEMOGLOBIN: 9.1 g/dL — AB (ref 12.0–15.0)
MCH: 30.6 pg (ref 26.0–34.0)
MCHC: 32.9 g/dL (ref 30.0–36.0)
MCV: 93.3 fL (ref 78.0–100.0)
Platelets: 376 10*3/uL (ref 150–400)
RBC: 2.97 MIL/uL — AB (ref 3.87–5.11)
RDW: 16 % — ABNORMAL HIGH (ref 11.5–15.5)
WBC: 19.5 10*3/uL — AB (ref 4.0–10.5)

## 2017-01-19 LAB — GLUCOSE, CAPILLARY
GLUCOSE-CAPILLARY: 157 mg/dL — AB (ref 65–99)
GLUCOSE-CAPILLARY: 184 mg/dL — AB (ref 65–99)
GLUCOSE-CAPILLARY: 103 mg/dL — AB (ref 65–99)
Glucose-Capillary: 154 mg/dL — ABNORMAL HIGH (ref 65–99)
Glucose-Capillary: 156 mg/dL — ABNORMAL HIGH (ref 65–99)
Glucose-Capillary: 158 mg/dL — ABNORMAL HIGH (ref 65–99)
Glucose-Capillary: 167 mg/dL — ABNORMAL HIGH (ref 65–99)

## 2017-01-19 LAB — URINALYSIS, ROUTINE W REFLEX MICROSCOPIC
BILIRUBIN URINE: NEGATIVE
Glucose, UA: NEGATIVE mg/dL
KETONES UR: NEGATIVE mg/dL
Nitrite: NEGATIVE
Protein, ur: 100 mg/dL — AB
SPECIFIC GRAVITY, URINE: 1.01 (ref 1.005–1.030)
pH: 7 (ref 5.0–8.0)

## 2017-01-19 LAB — POCT I-STAT 3, ART BLOOD GAS (G3+)
ACID-BASE EXCESS: 3 mmol/L — AB (ref 0.0–2.0)
Bicarbonate: 30.1 mmol/L — ABNORMAL HIGH (ref 20.0–28.0)
O2 SAT: 100 %
PH ART: 7.333 — AB (ref 7.350–7.450)
Patient temperature: 98.6
TCO2: 32 mmol/L (ref 22–32)
pCO2 arterial: 56.7 mmHg — ABNORMAL HIGH (ref 32.0–48.0)
pO2, Arterial: 245 mmHg — ABNORMAL HIGH (ref 83.0–108.0)

## 2017-01-19 LAB — MAGNESIUM: MAGNESIUM: 1.9 mg/dL (ref 1.7–2.4)

## 2017-01-19 MED ORDER — RACEPINEPHRINE HCL 2.25 % IN NEBU
INHALATION_SOLUTION | RESPIRATORY_TRACT | Status: AC
  Administered 2017-01-19: 0.5 mL
  Filled 2017-01-19: qty 0.5

## 2017-01-19 MED ORDER — ROCURONIUM BROMIDE 50 MG/5ML IV SOLN
50.0000 mg | Freq: Once | INTRAVENOUS | Status: AC
  Administered 2017-01-19: 50 mg via INTRAVENOUS

## 2017-01-19 MED ORDER — DILTIAZEM 12 MG/ML ORAL SUSPENSION
60.0000 mg | Freq: Four times a day (QID) | ORAL | Status: DC
  Administered 2017-01-19: 60 mg via ORAL
  Filled 2017-01-19 (×4): qty 6

## 2017-01-19 MED ORDER — DILTIAZEM HCL 100 MG IV SOLR
5.0000 mg/h | INTRAVENOUS | Status: DC
  Administered 2017-01-19: 5 mg/h via INTRAVENOUS
  Administered 2017-01-20: 15 mg/h via INTRAVENOUS
  Filled 2017-01-19 (×3): qty 100

## 2017-01-19 MED ORDER — FENTANYL CITRATE (PF) 100 MCG/2ML IJ SOLN
INTRAMUSCULAR | Status: AC
  Filled 2017-01-19: qty 2

## 2017-01-19 MED ORDER — MIDAZOLAM HCL 2 MG/2ML IJ SOLN
2.0000 mg | Freq: Once | INTRAMUSCULAR | Status: AC
  Administered 2017-01-19: 2 mg via INTRAVENOUS

## 2017-01-19 MED ORDER — DEXAMETHASONE SODIUM PHOSPHATE 4 MG/ML IJ SOLN
4.0000 mg | Freq: Four times a day (QID) | INTRAMUSCULAR | Status: AC
  Administered 2017-01-19 – 2017-01-20 (×4): 4 mg via INTRAVENOUS
  Filled 2017-01-19: qty 0.4
  Filled 2017-01-19: qty 1
  Filled 2017-01-19: qty 0.4
  Filled 2017-01-19: qty 1

## 2017-01-19 MED ORDER — FENTANYL CITRATE (PF) 100 MCG/2ML IJ SOLN
50.0000 ug | Freq: Once | INTRAMUSCULAR | Status: AC
  Administered 2017-01-19: 50 ug via INTRAVENOUS

## 2017-01-19 MED ORDER — QUETIAPINE FUMARATE 100 MG PO TABS
100.0000 mg | ORAL_TABLET | Freq: Every day | ORAL | Status: DC
  Administered 2017-01-20 – 2017-01-23 (×4): 100 mg
  Filled 2017-01-19 (×5): qty 1

## 2017-01-19 MED ORDER — METOPROLOL TARTRATE 5 MG/5ML IV SOLN
5.0000 mg | INTRAVENOUS | Status: DC | PRN
  Administered 2017-01-20: 5 mg via INTRAVENOUS
  Filled 2017-01-19: qty 5

## 2017-01-19 MED ORDER — MIDAZOLAM HCL 2 MG/2ML IJ SOLN
INTRAMUSCULAR | Status: AC
  Filled 2017-01-19: qty 2

## 2017-01-19 MED ORDER — ETOMIDATE 2 MG/ML IV SOLN
20.0000 mg | Freq: Once | INTRAVENOUS | Status: AC
  Administered 2017-01-19: 20 mg via INTRAVENOUS

## 2017-01-19 MED ORDER — DEXMEDETOMIDINE HCL IN NACL 200 MCG/50ML IV SOLN
0.0000 ug/kg/h | INTRAVENOUS | Status: DC
  Administered 2017-01-19: 0.4 ug/kg/h via INTRAVENOUS
  Filled 2017-01-19: qty 50

## 2017-01-19 MED ORDER — FUROSEMIDE 10 MG/ML IJ SOLN
40.0000 mg | Freq: Two times a day (BID) | INTRAMUSCULAR | Status: DC
  Administered 2017-01-19 – 2017-01-20 (×3): 40 mg via INTRAVENOUS
  Filled 2017-01-19 (×5): qty 4

## 2017-01-19 NOTE — Progress Notes (Signed)
Patient transitioned to HFNC at 10 L due to low Spo2 and increased WOB. Audible stridor present so Racemic Epinephrine HHN treatment given with little to no improvement in Stridor. MD aware.

## 2017-01-19 NOTE — Progress Notes (Signed)
This RN has called CT to ask when pt can be transported and was informed "okay, we will put her in for transport."

## 2017-01-19 NOTE — Procedures (Signed)
Intubation Procedure Note Morgan Cooper [id number] 11/14/1945  Procedure: Intubation Indications: Respiratory insufficiency  Procedure Details Consent: Risks of procedure as well as the alternatives and risks of each were explained to the (patient/caregiver).  Consent for procedure obtained. Time Out: Verified patient identification, verified procedure, site/side was marked, verified correct patient position, special equipment/implants available, medications/allergies/relevent history reviewed, required imaging and test results available.  Performed  Maximum sterile technique was used including gown and mask.  MAC and 3    Evaluation Hemodynamic Status: BP stable throughout; O2 sats: currently acceptable Patient's Current Condition: stable Complications: No apparent complications Patient did tolerate procedure well. Chest X-ray ordered to verify placement.  CXR: tube position acceptable.   Revonda Standard 01/19/2017

## 2017-01-19 NOTE — Procedures (Deleted)
Premedicated w/ 50 mcg fent and 10mg  etomidate.  Tracheostomy tube change The old  # 6  Cuffed adjustable portex trach was carefully removed over tube changer. the tracheostomy site appeared: unremarkable. A new # 5 Cuffed  trach was easily placed in the tracheostomy stoma and secured with velcro trach ties. The tracheostomy was patent, good color change observed via EZ-CAP, and the patient was easily able to voice with finger occlusion and tolerated the procedure well with no immediate complications.   Erick Colace ACNP-BC [place] Pager # [phone] OR # [phone] if no answer

## 2017-01-19 NOTE — Care Management Note (Addendum)
Case Management Note  Patient Details  Name: Morgan Cooper MRN: [id number] Date of Birth: [birth year]  Subjective/Objective:   Pt admitted with SOB and DOE                  Action/Plan:  PTA from home, ESRD pt s/p renal transplant.  Pt remains intubated - may receive trach and at that time will be appropriate for [facility] referral per attending (physician advisor is in agreement)  CM will continue to follow for discharge needs   Expected Discharge Date:                  Expected Discharge Plan:  Long Term Acute Care (LTAC)  In-House Referral:  Clinical Social Work  Discharge planning Services  CM Consult  Post Acute Care Choice:    Choice offered to:     DME Arranged:    DME Agency:     HH Arranged:    [place] Agency:     Status of Service:     If discussed at [employer], dates discussed:    Additional Comments: 01/19/2017  Unfortunately pt could not sustain without ventilation and was therefore trached.  Both agencies informed of pts new status and official [facility] referral.  Per PCCM pt will not be stable for transfer to facility until Monday 01/22/17.  CM spoke directly with daughter Morgan Cooper and informed her of LTACH recommendation, provided choice including both [facility] and [facility].  Both liaisons to follow up with daughter directly and CM will follow back up with daughter on 01/22/17   Pt was extubated today however respiratory status remains tenuous - CM will continue to follow for discharge needs including LTACH if pt requires re-intubation/trach per attending Maryclare Labrador, RN 01/19/2017, 12:09 PM

## 2017-01-19 NOTE — Progress Notes (Signed)
This RN has called Ct to determine status of transport to CT and was told a transporter is on the way

## 2017-01-19 NOTE — Procedures (Signed)
Bedside Tracheostomy Insertion Procedure Note   Patient Details:   Name: Morgan Cooper DOB: 1945-12-29 MRN: [id number]  Procedure: Tracheostomy  Pre Procedure Assessment: ET Tube Size:7.0 ET Tube secured at lip (cm):24 Bite block in place: Yes Breath Sounds: Clear  Post Procedure Assessment: BP (!) 147/96   Pulse (!) 110   Temp 98 F (36.7 C) (Axillary)   Resp 16   Ht 5\' 5"  (1.651 m)   Wt 132 lb 7.9 oz (60.1 kg)   SpO2 98%   BMI 22.05 kg/m  O2 sats: stable throughout Complications: No apparent complications Patient did tolerate procedure well Tracheostomy Brand:Shiley Tracheostomy Style:Cuffed Tracheostomy Size: 6.0 Tracheostomy Secured TCY:ELYHTMB Tracheostomy Placement Confirmation:Trach cuff visualized and in place    Phillis Knack [facility] 01/19/2017, 3:22 PM

## 2017-01-19 NOTE — Procedures (Signed)
Percutaneous Tracheostomy Placement  Consent from family.  Patient sedated, paralyzed and position.  Placed on 100% FiO2 and RR matched.  Area cleaned and draped.  Lidocaine/epi injected.  Skin incision done followed by blunt dissection.  Trachea palpated then punctured, catheter passed and visualized bronchoscopically.  Wire placed and visualized.  Catheter removed.  Airway then entered and dilated.  Size 6 cuffed shiley trach placed and visualized bronchoscopically well above carina.  Good volume returns.  Patient tolerated the procedure well without complications.  Minimal blood loss.  CXR ordered and pending.  Wesam G. Yacoub, M.D. [place] [facility]. Pager: [phone]. After hours pager: [phone].  

## 2017-01-19 NOTE — Progress Notes (Signed)
Nutrition Follow-up  DOCUMENTATION CODES:   Severe malnutrition in context of chronic illness  INTERVENTION:   Continue:  Vital AF 1.2 at 55 ml/h (1320 ml per day)  Provides 1584 kcal, 99 gm protein, 1071 ml free water daily  NUTRITION DIAGNOSIS:   Severe Malnutrition related to chronic illness(CHF) as evidenced by severe muscle depletion, severe fat depletion.  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  Patient was extubated this morning. Remains NPO. Cortrak to be placed today for continuation of TF and medication administration. TF off since extubation, previously received Vital AF 1.2 via OGT at 55 ml/h (1320 ml/day) to provide 1584 kcal, 99 gm protein, 1071 ml free water daily. Tolerated without difficulty. Free water 300 ml every 4 hours. Labs reviewed. CBG's: [phone] Medications reviewed and include Lasix.  Diet Order:  Diet NPO time specified  EDUCATION NEEDS:   No education needs have been identified at this time  Skin:  Skin Integrity Issues:: DTI DTI: sacrum  Last BM:  11/30 (rectal tube)   Height:   Ht Readings from Last 1 Encounters:  01/03/17 5' 5"  (1.651 m)    Weight:   Wt Readings from Last 1 Encounters:  01/19/17 132 lb 7.9 oz (60.1 kg)    Ideal Body Weight:  56.8 kg  BMI:  Body mass index is 22.05 kg/m.  Estimated Nutritional Needs:   Kcal:  1500-1700  Protein:  75-90 gm  Fluid:  1.5-1.7 L   Molli Barrows, RD, LDN, [place] Pager [phone] After Hours Pager [phone]

## 2017-01-19 NOTE — Procedures (Signed)
Intubation Procedure Note Morgan Cooper [id number] 04-Jul-1945  Procedure: Intubation Indications: Airway protection and maintenance  Procedure Details Consent: Risks of procedure as well as the alternatives and risks of each were explained to the (patient/caregiver).  Consent for procedure obtained. Time Out: Verified patient identification, verified procedure, site/side was marked, verified correct patient position, special equipment/implants available, medications/allergies/relevent history reviewed, required imaging and test results available.  Performed  Maximum sterile technique was used including gloves and mask.  Miller and 3    Evaluation Hemodynamic Status: BP stable throughout; O2 sats: stable throughout Patient's Current Condition: stable Complications: No apparent complications Patient did tolerate procedure well. Chest X-ray ordered to verify placement.  CXR: pending.   Morgan Cooper 01/19/2017

## 2017-01-19 NOTE — Progress Notes (Signed)
PULMONARY / CRITICAL CARE MEDICINE   Name: Morgan Cooper MRN: [id number] DOB: 1945-09-25    ADMISSION DATE:01/03/2017  CONSULTATION DATE:01/04/17  REFERRING DX:IPJAS Phillip Heal, NP / TRH  CHIEF COMPLAINT:Acute Hypoxic Respiratory Failure   Presenting HPI:  71 y.o. female with prior history of tobacco use. Presented to hospital with acute hypoxic respiratory failure and atrial fibrillation with rapid ventricular response. Concern for possible healthcare associated pneumonia. 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 Also history of COPD with emphysema & ILD presumably from [place]. Patient has a known history of atrial fibrillation, stroke, sleep apnea, macular degeneration, diastolic congestive heart failure, and gastroparesis.  Subjective:  Patient unable to provide as intubated, agitated.  Review of Systems:  Unable to obtain given intubation.   Vent Mode: PCV FiO2 (%):  [40 %] 40 % Set Rate:  [26 bmp] 26 bmp PEEP:  [5 cmH20] 5 cmH20 Pressure Support:  [5 cmH20] 5 cmH20 Plateau Pressure:  [19 cmH20-22 cmH20] 21 cmH20  Temp:  [97.8 F (36.6 C)-98.8 F (37.1 C)] 98.6 F (37 C) (11/30 0425) Pulse Rate:  [80-115] 106 (11/30 0600) Resp:  [13-34] 25 (11/30 0600) BP: (67-148)/(52-106) 109/70 (11/30 0600) SpO2:  [92 %-100 %] 94 % (11/30 0600) FiO2 (%):  [40 %] 40 % (11/30 0400) Weight:  [132 lb 7.9 oz (60.1 kg)] 132 lb 7.9 oz (60.1 kg) (11/30 0500)  Gen.: No distress. Awake. Cardiovascular: Tachycardic. Irregular rhythm.  Pulmonary: Clear breath sounds bilaterally. Symmetric chest wall rise on ventilator. Normal respiratory rate. Abdomen: Soft. Normal bowel sounds. Neurological: Attends to voice. Nods to questions. HEENT: Moist mucous membranes. No scleral icterus. Endotracheal tube in place.  LINES/TUBES: OETT 11/16 >>> R IJ CVL 11/16 >>> L RAD ART LINE 11/16 >>> Foley >>> replace 11/26 >> OGT >>> PIV  CBC Latest Ref Rng  & Units 01/18/2017 01/17/2017 01/17/2017  WBC 4.0 - 10.5 K/uL 13.6(H) 16.7(H) -  Hemoglobin 12.0 - 15.0 g/dL 8.9(L) 9.1(L) 8.1(L)  Hematocrit 36.0 - 46.0 % 28.2(L) 28.2(L) 25.1(L)  Platelets 150 - 400 K/uL 331 389 -   BMP Latest Ref Rng & Units 01/18/2017 01/17/2017 01/16/2017  Glucose 65 - 99 mg/dL 174(H) 105(H) 135(H)  BUN 6 - 20 mg/dL 96(H) 97(H) 106(H)  Creatinine 0.44 - 1.00 mg/dL 2.19(H) 2.15(H) 2.35(H)  Sodium 135 - 145 mmol/L 144 146(H) 147(H)  Potassium 3.5 - 5.1 mmol/L 4.7 4.9 4.3  Chloride 101 - 111 mmol/L 110 113(H) 113(H)  CO2 22 - 32 mmol/L 25 26 24   Calcium 8.9 - 10.3 mg/dL 8.6(L) 9.1 8.8(L)   Hepatic Function Latest Ref Rng & Units 01/16/2017 01/15/2017 01/14/2017  Total Protein 6.5 - 8.1 g/dL - - -  Albumin 3.5 - 5.0 g/dL 2.2(L) 2.1(L) 1.9(L)  AST 15 - 41 U/L - - -  ALT 14 - 54 U/L - - -  Alk Phosphatase 38 - 126 U/L - - -  Total Bilirubin 0.3 - 1.2 mg/dL - - -  Bilirubin, Direct <=0.2 mg/dL - - -    IMAGING/STUDIES: CTA CHEST 11/14 IMPRESSION: 1. No evidence acute pulmonary embolism. 2. Diffuse airspace disease in the RIGHT upper lobe superimposed on centrilobular emphysema. Etiology includes  Pulmonary edema versus pneumonia. 3. Coronary artery calcification 4.  Aortic Atherosclerosis (ICD10-I70.0)  5.  Emphysema (ICD10-J43.9). TTE 11/15:  LV normal in size w/ EF 55-60%. LA moderately dilated & RA moderately dilated. RV mildly dilated with preserved systolic function. Trivial aortic regurgitation. Trivial mitral regurgitation without stenosis. No pulmonic  regurgitation. Mild tricuspid regurgitation. No pericardial effusion. CT ABD/PELVIS W/O 11/19: IMPRESSION: 1. Study markedly degraded by patient motion and limited by lack of intravenous contrast material. 2. Gallbladder is distended with layering sludge or tiny stones. There appears to be pericholecystic fluid although assessment is hindered by the motion artifact. Abdominal ultrasound may prove helpful to  further evaluate. 3. 5 x 3 cm apparent fluid collection in the distal pancreatectomy bed at the site of the previous drainage catheter. This is not well evaluated given the above limitations, but seroma, hematoma, or abscess could have this appearance. 4. Lung bases appear better aerated than CT scan of 01/03/2017 although there is persistent bilateral lower lung  ground-glass attenuation potentially rated to pulmonary edema or diffuse infection. 5. New focal opacity measuring 2 cm in the left base. Given that this was not present on the study from 5 days ago, it  probably represents atelectasis or infection. 6. Similar appearance 4.3 cm abdominal aortic aneurysm. 7. No overt hydronephrosis and left pelvic transplant kidney. Gas in the bladder lumen presumably related to the  presence of a Foley catheter. 8. Small volume intraperitoneal free fluid with diffuse body wall edema. PORT CXR 11/19: Endotracheal tube in good position. Enteric feeding tube coursing below the diaphragm. No new focal opacity appreciated. PORT CXR 11/21:   Significant rotation of the chest film to the left. Questionable silhouetting of extreme lateral left hemidiaphragm. No new focal opacity appreciated. Endotracheal tube in good position. PORT CXR 11/22:   Endotracheal tube in good position. Enteric feeding tube coursing below diaphragm. Film rotated to the left. No obvious new focal opacity. Silhouetting of left hemidiaphragm could be due to atelectasis or evolving infiltrate.  MICROBIOLOGY: MRSA PCR 11/14:  Negative Blood Cultures x2 11/14:  Negative Tracheal Aspirate Culture 11/16:  Normal Respiratory Flora  Respiratory Panel PCR 11/15:  Negative  Influenza A/B PCR  11/14:  Negative  Urine Streptococcal Antigen 11/14:  Negative  Urine Legionella Antigen 11/14:  Negative   ANTIBIOTICS: Azithromycin 11/14 - 11/15 Rocephin 11/14 - 11/15 Vancomycin 11/14 - 11/18 Zosyn 11/15 - 11/22  SIGNIFICANT EVENTS: 11/14 -  Admit 11/16 - Transfer to ICU with worsening respiratory status 11/18 - Changed to pressure control ventilation  11/20 - Didn't tolerate attempt to switch back from Pressure Control to PRVC. Heparin drip stopped w/ anemia.  11/21 - Started Seroquel 43m BID.  11/23 - More calm and awake today. Tolerating PS 5/5 today w/ TV >800cc.  11/25 - Following commands today & more calm. Tolerating PS 5/5 again today. 11/26 - replacing foley, mild abdominal distention 11/28 - upper endoscopy with moderate gastritis   ASSESSMENT/PLAN:  71y.o.  female with chronic immunosuppression. Renal function continues to improve. Urine output remained stable. Mental status is progressively improving as well with treatment of her underlying delirium. She is tolerating pressure support weaning.  1. Acute on chronic hypoxic respiratory failure: Continuing daily pressure support wean and spontaneous breathing trials. Continuous pulse oximetry monitoring. Hopeful for extubation today. If not able to extubate, would need to consider trach. Bedside [place] assess for possible pleural effusion. 2. Healthcare associated pneumonia/aspiration pneumonia: Status post treatment of seven-day course with Zosyn. Plan to repeat culture for fever. 3. Acute encephalopathy/delirium: Steadily improving. Seroquel to 100 mg via tube twice a day. Continuing to limit sedation/benzodiazepines. Monitoring QTC daily on EKG. Restarted home prozac per tube.  4. Atrial fibrillation: Management per cardiology. Currently on Lopressor. Slowly decreasing dose of diltiazem. Holding heparin drip and  subq given melena and anemia. 5. Anemia: Suspect secondary to GI loss with melena.  Protonix per tube every 12 hours. Trending hemoglobin daily with CBC. Upper endoscopy 11/28 with significant gastritis. S/p 1U PRBC 11/27. 6. Acute renal failure: Stable. Nephrology following. Trending electrolytes and renal function daily. Monitor urine output with Foley catheter.  4.4L out last 24 hours. 7. Azotemia: Stable. Multifactorial from steroids and renal failure, likely upper GIB. 8. Adrenal insufficiency/chronic prednisone therapy: Continuing Prednisone 7.41m daily.  9. Hyperglycemia: Stable and improving as steroids have tapered. No history of diabetes mellitus. Lantus 10U QHS. Continuing sliding scale insulin with Accu-Cheks every 4 hours and moderate algorithm.  10. Acute on chronic diastolic congestive heart failure:  Continuing Lipitor. S/p lasix 439mBID x 2 doses 11/29.  11. Severe protein-calorie malnutrition: Continuing tube feeds per dietary recommendations. 12. COPD: No signs of acute exacerbation. 13. ILD: Reportedly secondary to [place] signs of acute flare.  Prophylaxis: SCDs, & Protonix per tube q12hr.  Diet:  NPO. Tube feedings per dietary recommendations. Code Status:  Full Code. Disposition:  Remains critically ill in the ICU. Family Update:  Daughter updated during rounds today.  KaRalene OkMD PGY 2 Family Medicine 7:13 AM

## 2017-01-19 NOTE — Procedures (Signed)
Extubation Procedure Note  Patient Details:   Name: Morgan Cooper DOB: [birth year] MRN: [id number]   Airway Documentation:  Airway 7.5 mm (Active)  Secured at (cm) 22 cm 01/19/2017  7:30 AM  Measured From Lips 01/19/2017  7:30 AM  Secured Location Left 01/19/2017  7:30 AM  Secured By [employer] 01/19/2017  7:30 AM  Tube Holder Repositioned Yes 01/19/2017  7:30 AM  Cuff Pressure (cm H2O) 28 cm H2O 01/19/2017  7:30 AM  Site Condition Other (Comment) 01/19/2017  8:00 AM   Positive cuff leak prior to extubation.   Evaluation  O2 sats: stable throughout Complications: No apparent complications Patient did tolerate procedure well. Bilateral Breath Sounds: Clear, Diminished   Yes   RN at bedside during extubation. No stridor or distress noted and VS stable throughout.   Elwin Mocha 01/19/2017, 11:22 AM

## 2017-01-19 NOTE — Progress Notes (Signed)
71 y.o.year-old with hx of ESRD sp renal transplant (LRD, 1984, pred/ imuran) Presented 11/14 w/SOB/ DOE and some mild CP. Recent new AF on BB and Eliquis. Hx of dCHF, not on diuretics PTA. Hypoxic on adm. BNP was 2000, initial Rx IV diltiazem for afib /RVR,not felt to be vol overloaded. CT angio w/ contrast no PE but RUL air space disease, pulm edema vs infection. Lactic acid 1.45, WBC 9k. Admitted, rx'd with IV diltiazem, po metoprolol, IV abx (rocephin, vanc/ zosyn) for PNA. Intubated 11/15. Admit creat 0.8, 2.2 on 11/16, 3.14 11/18 w/oliguria. Weight up 50->53 kg. CXR diffuse bilat infiltrates, R>L . Hypotension on levo gtt for about 24 hrs.BP's in low 100 range. ECHO showed normal LVEF. Asked to see for AKI.   Impression: 1. AKI -creatinine stalled 2. Renal transplant with dysfunction - Creat normal 0.8. Peak creatinine 4.3.Home meds: imuran and prednisone (now at 7.5 mg). 3. Hypernatremia, improving 4. VDRF - per CCM, for extubation 5. Multifocal PNA / hx pulm fibrosis and COPD  6. AF with RVR,recurrent 7. DCHF, getting diuresed 8. Anemia -transfused, Endo donew/ diffuse gastritis 9. Diarrhea -resolved  Subjective: Interval History: Good diuresis  Objective: Vital signs in last 24 hours: Temp:  [97.8 F (36.6 C)-99.3 F (37.4 C)] 99.3 F (37.4 C) (11/30 0840) Pulse Rate:  [80-118] 107 (11/30 1100) Resp:  [13-34] 22 (11/30 1100) BP: (67-147)/(52-97) 142/93 (11/30 1100) SpO2:  [92 %-100 %] 95 % (11/30 1121) FiO2 (%):  [40 %] 40 % (11/30 0800) Weight:  [60.1 kg (132 lb 7.9 oz)] 60.1 kg (132 lb 7.9 oz) (11/30 0500) Weight change: -2.3 kg (-1.1 oz)  Intake/Output from previous day: 11/29 0701 - 11/30 0700 In: 2675 [I.V.:320; NG/GT:2355] Out: 4430 [Urine:4430] Intake/Output this shift: Total I/O In: 400 [I.V.:40; NG/GT:360] Out: 300 [Urine:300]  General appearance: alert and cooperative Resp: clear to auscultation bilaterally Chest wall: no  tenderness Extremities: edema 1+ at knees  Lab Results: Recent Labs    01/18/17 0254 01/19/17 0836  WBC 13.6* 19.5*  HGB 8.9* 9.1*  HCT 28.2* 27.7*  PLT 331 376   BMET:  Recent Labs    01/18/17 0254 01/19/17 0836  NA 144 140  K 4.7 4.1  CL 110 104  CO2 25 25  GLUCOSE 174* 159*  BUN 96* 85*  CREATININE 2.19* 2.13*  CALCIUM 8.6* 8.5*   No results for input(s): PTH in the last 72 hours. Iron Studies: No results for input(s): IRON, TIBC, TRANSFERRIN, FERRITIN in the last 72 hours. Studies/Results: Dg Chest Port 1 View  Result Date: 01/19/2017 CLINICAL DATA:  Respiratory failure. EXAM: PORTABLE CHEST 1 VIEW COMPARISON:  01/18/2017 FINDINGS: The heart is enlarged but stable. There is tortuosity and calcification of the thoracic aorta. Persistent and slight worsening diffuse airspace process, cystic air spaces and left pleural effusion. The endotracheal tube and ET tubes are stable. IMPRESSION: Slight worsening diffuse airspace process. Stable support apparatus. Electronically Signed   By: Marijo Sanes M.D.   On: 01/19/2017 08:04   Dg Chest Port 1 View  Result Date: 01/18/2017 CLINICAL DATA:  Respiratory distress. EXAM: PORTABLE CHEST 1 VIEW COMPARISON:  01/09/2017 FINDINGS: The heart is mildly enlarged but stable. Stable tortuosity and calcification of the thoracic aorta. Chronic underlying emphysematous changes with superimposed bilateral infiltrates and left pleural effusion. Stable cystic air space at the left lung base. The endotracheal tube is 3.4 cm above the carina. The NG tube is coursing down the esophagus and into the stomach. The right IJ  central venous catheter has been removed. IMPRESSION: 1. The ET and NG tubes are stable. The right IJ central venous catheter has been removed. 2. Persistent diffuse airspace process and left effusion. Electronically Signed   By: Marijo Sanes M.D.   On: 01/18/2017 10:17    Scheduled: . atorvastatin  20 mg Per Tube Daily  .  azaTHIOprine  50 mg Oral Daily  . chlorhexidine gluconate (MEDLINE KIT)  15 mL Mouth Rinse BID  . Chlorhexidine Gluconate Cloth  6 each Topical Daily  . cycloSPORINE  1 drop Both Eyes BID  . diltiazem  60 mg Oral Q6H  . FLUoxetine  60 mg Per Tube Daily  . free water  300 mL Per Tube Q4H  . furosemide  40 mg Intravenous BID  . insulin aspart  0-15 Units Subcutaneous Q4H  . insulin glargine  10 Units Subcutaneous QHS  . mouth rinse  15 mL Mouth Rinse QID  . metoprolol tartrate  100 mg Per Tube BID  . pantoprazole sodium  40 mg Per Tube BID  . predniSONE  7.5 mg Per Tube Q breakfast  . [START ON 01/20/2017] QUEtiapine  100 mg Per Tube QHS  . sodium chloride flush  10-40 mL Intracatheter Q12H    LOS: 16 days   Estanislado Emms 01/19/2017,11:46 AM

## 2017-01-20 ENCOUNTER — Inpatient Hospital Stay (HOSPITAL_COMMUNITY): Payer: Medicare Other

## 2017-01-20 LAB — GLUCOSE, CAPILLARY
GLUCOSE-CAPILLARY: 163 mg/dL — AB (ref 65–99)
GLUCOSE-CAPILLARY: 146 mg/dL — AB (ref 65–99)
GLUCOSE-CAPILLARY: 180 mg/dL — AB (ref 65–99)
Glucose-Capillary: 207 mg/dL — ABNORMAL HIGH (ref 65–99)
Glucose-Capillary: 172 mg/dL — ABNORMAL HIGH (ref 65–99)
Glucose-Capillary: 244 mg/dL — ABNORMAL HIGH (ref 65–99)

## 2017-01-20 LAB — CBC WITH DIFFERENTIAL/PLATELET
BASOS ABS: 0 10*3/uL (ref 0.0–0.1)
BASOS PCT: 0 %
EOS ABS: 0 10*3/uL (ref 0.0–0.7)
EOS PCT: 0 %
HEMATOCRIT: 34.2 % — AB (ref 36.0–46.0)
Hemoglobin: 11 g/dL — ABNORMAL LOW (ref 12.0–15.0)
Lymphocytes Relative: 2 %
Lymphs Abs: 0.4 10*3/uL — ABNORMAL LOW (ref 0.7–4.0)
MCH: 30.5 pg (ref 26.0–34.0)
MCHC: 32.2 g/dL (ref 30.0–36.0)
MCV: 94.7 fL (ref 78.0–100.0)
MONOS PCT: 0 %
Monocytes Absolute: 0.1 10*3/uL (ref 0.1–1.0)
NEUTROS ABS: 19.4 10*3/uL — AB (ref 1.7–7.7)
Neutrophils Relative %: 98 %
PLATELETS: 608 10*3/uL — AB (ref 150–400)
RBC: 3.61 MIL/uL — ABNORMAL LOW (ref 3.87–5.11)
RDW: 15.5 % (ref 11.5–15.5)
WBC: 19.8 10*3/uL — ABNORMAL HIGH (ref 4.0–10.5)

## 2017-01-20 LAB — BASIC METABOLIC PANEL
Anion gap: 14 (ref 5–15)
BUN: 76 mg/dL — AB (ref 6–20)
CO2: 24 mmol/L (ref 22–32)
CREATININE: 2.14 mg/dL — AB (ref 0.44–1.00)
Calcium: 9.2 mg/dL (ref 8.9–10.3)
Chloride: 104 mmol/L (ref 101–111)
GFR calc Af Amer: 25 mL/min — ABNORMAL LOW (ref >60)
GFR, EST NON AFRICAN AMERICAN: 22 mL/min — AB (ref >60)
GLUCOSE: 151 mg/dL — AB (ref 65–99)
POTASSIUM: 4.6 mmol/L (ref 3.5–5.1)
SODIUM: 142 mmol/L (ref 135–145)

## 2017-01-20 LAB — CULTURE, RESPIRATORY W GRAM STAIN

## 2017-01-20 LAB — PROTIME-INR
INR: 1.1
Prothrombin Time: 14.1 seconds (ref 11.4–15.2)

## 2017-01-20 LAB — TYPE AND SCREEN
ABO/RH(D): AB POS
ANTIBODY SCREEN: NEGATIVE

## 2017-01-20 LAB — MAGNESIUM: Magnesium: 2.2 mg/dL (ref 1.7–2.4)

## 2017-01-20 LAB — PROCALCITONIN: Procalcitonin: 0.14 ng/mL

## 2017-01-20 LAB — CULTURE, RESPIRATORY

## 2017-01-20 LAB — PHOSPHORUS: Phosphorus: 7.2 mg/dL — ABNORMAL HIGH (ref 2.5–4.6)

## 2017-01-20 MED ORDER — VANCOMYCIN HCL IN DEXTROSE 750-5 MG/150ML-% IV SOLN
750.0000 mg | INTRAVENOUS | Status: DC
  Filled 2017-01-20: qty 150

## 2017-01-20 MED ORDER — PIPERACILLIN-TAZOBACTAM IN DEX 2-0.25 GM/50ML IV SOLN
2.2500 g | Freq: Three times a day (TID) | INTRAVENOUS | Status: DC
  Administered 2017-01-20 – 2017-01-22 (×7): 2.25 g via INTRAVENOUS
  Filled 2017-01-20 (×8): qty 50

## 2017-01-20 MED ORDER — VANCOMYCIN HCL IN DEXTROSE 1-5 GM/200ML-% IV SOLN
1000.0000 mg | Freq: Once | INTRAVENOUS | Status: AC
  Administered 2017-01-20: 1000 mg via INTRAVENOUS
  Filled 2017-01-20: qty 200

## 2017-01-20 MED ORDER — DILTIAZEM 12 MG/ML ORAL SUSPENSION
60.0000 mg | Freq: Four times a day (QID) | ORAL | Status: DC
  Administered 2017-01-20 – 2017-01-21 (×6): 60 mg
  Filled 2017-01-20 (×7): qty 6

## 2017-01-20 MED ORDER — PRO-STAT SUGAR FREE PO LIQD
30.0000 mL | Freq: Two times a day (BID) | ORAL | Status: DC
  Administered 2017-01-20 – 2017-01-24 (×9): 30 mL
  Filled 2017-01-20 (×11): qty 30

## 2017-01-20 MED ORDER — HEPARIN (PORCINE) IN NACL 100-0.45 UNIT/ML-% IJ SOLN
1100.0000 [IU]/h | INTRAMUSCULAR | Status: DC
  Administered 2017-01-20 – 2017-01-22 (×2): 1100 [IU]/h via INTRAVENOUS
  Filled 2017-01-20 (×4): qty 250

## 2017-01-20 MED ORDER — NEPRO/CARBSTEADY PO LIQD
1000.0000 mL | ORAL | Status: DC
  Administered 2017-01-20 – 2017-01-22 (×2): 1000 mL
  Filled 2017-01-20 (×5): qty 1000

## 2017-01-20 NOTE — Progress Notes (Signed)
PULMONARY / CRITICAL CARE MEDICINE   Name: Morgan Cooper MRN: [id number] DOB: September 26, 1945    ADMISSION DATE:01/03/2017  CONSULTATION DATE:01/04/17  REFERRING VH:QIONG Phillip Heal, NP / TRH  CHIEF COMPLAINT:Acute Hypoxic Respiratory Failure   Presenting HPI:  71 y.o. female with prior history of tobacco use. Presented to hospital with acute hypoxic respiratory failure and atrial fibrillation with rapid ventricular response. Concern for possible healthcare associated pneumonia. 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 Also history of COPD with emphysema & ILD presumably from [place]. Patient has a known history of atrial fibrillation, stroke, sleep apnea, macular degeneration, diastolic congestive heart failure, and gastroparesis.  Subjective:   Failed extubation yesterday due to stridor, increased WOB and aspiration Reitubated and trached CT scan shows air in mediastinum. She has remained stable overnight  Review of Systems:  Unable to obtain given intubation.   Vent Mode: PCV FiO2 (%):  [40 %-100 %] 50 % Set Rate:  [26 bmp] 26 bmp Vt Set:  [500 mL] 500 mL PEEP:  [8 cmH20-10 cmH20] 8 cmH20 Plateau Pressure:  [21 cmH20-25 cmH20] 23 cmH20  Temp:  [97.7 F (36.5 C)-99.3 F (37.4 C)] 98 F (36.7 C) (12/01 0337) Pulse Rate:  [52-123] 117 (12/01 0600) Resp:  [15-30] 26 (12/01 0600) BP: (81-151)/(56-110) 143/98 (12/01 0600) SpO2:  [85 %-100 %] 99 % (12/01 0600) FiO2 (%):  [40 %-100 %] 50 % (12/01 0434) Weight:  [119 lb 7.8 oz (54.2 kg)] 119 lb 7.8 oz (54.2 kg) (12/01 0441)  Gen:      No acute distress HEENT:  EOMI, sclera anicteric Neck:     No masses; no thyromegaly, trach site clean, no SQ air noted Lungs:    Coarse rhonchi; normal respiratory effort CV:         irregular; no murmurs Abd:      + bowel sounds; soft, non-tender; no palpable masses, no distension Ext:    No edema; adequate peripheral perfusion Skin:      Warm and  dry; no rash Neuro: Awake, no distress, responds to commands    LINES/TUBES: OETT 11/16 >>> 11/30 Trach 11/30>> R IJ CVL 11/16 >>> 11/29 Foley >>> replace 11/26 >> OGT >>> PIV  CBC Latest Ref Rng & Units 01/19/2017 01/18/2017 01/17/2017  WBC 4.0 - 10.5 K/uL 19.5(H) 13.6(H) 16.7(H)  Hemoglobin 12.0 - 15.0 g/dL 9.1(L) 8.9(L) 9.1(L)  Hematocrit 36.0 - 46.0 % 27.7(L) 28.2(L) 28.2(L)  Platelets 150 - 400 K/uL 376 331 389   BMP Latest Ref Rng & Units 01/19/2017 01/18/2017 01/17/2017  Glucose 65 - 99 mg/dL 159(H) 174(H) 105(H)  BUN 6 - 20 mg/dL 85(H) 96(H) 97(H)  Creatinine 0.44 - 1.00 mg/dL 2.13(H) 2.19(H) 2.15(H)  Sodium 135 - 145 mmol/L 140 144 146(H)  Potassium 3.5 - 5.1 mmol/L 4.1 4.7 4.9  Chloride 101 - 111 mmol/L 104 110 113(H)  CO2 22 - 32 mmol/L _0 Calcium 8.9 - 10.3 mg/dL 8.5(L) 8.6(L) 9.1   Hepatic Function Latest Ref Rng & Units 01/16/2017 01/15/2017 01/14/2017  Total Protein 6.5 - 8.1 g/dL - - -  Albumin 3.5 - 5.0 g/dL 2.2(L) 2.1(L) 1.9(L)  AST 15 - 41 U/L - - -  ALT 14 - 54 U/L - - -  Alk Phosphatase 38 - 126 U/L - - -  Total Bilirubin 0.3 - 1.2 mg/dL - - -  Bilirubin, Direct <=0.2 mg/dL - - -    IMAGING/STUDIES: CTA CHEST 11/14 IMPRESSION: 1. No evidence acute pulmonary  embolism. 2. Diffuse airspace disease in the RIGHT upper lobe superimposed on centrilobular emphysema. Etiology includes  Pulmonary edema versus pneumonia. 3. Coronary artery calcification 4.  Aortic Atherosclerosis (ICD10-I70.0)  5.  Emphysema (ICD10-J43.9). TTE 11/15:  LV normal in size w/ EF 55-60%. LA moderately dilated & RA moderately dilated. RV mildly dilated with preserved systolic function. Trivial aortic regurgitation. Trivial mitral regurgitation without stenosis. No pulmonic regurgitation. Mild tricuspid regurgitation. No pericardial effusion. CT ABD/PELVIS W/O 11/19: IMPRESSION: 1. Study markedly degraded by patient motion and limited by lack of intravenous contrast  material. 2. Gallbladder is distended with layering sludge or tiny stones. There appears to be pericholecystic fluid although assessment is hindered by the motion artifact. Abdominal ultrasound may prove helpful to further evaluate. 3. 5 x 3 cm apparent fluid collection in the distal pancreatectomy bed at the site of the previous drainage catheter. This is not well evaluated given the above limitations, but seroma, hematoma, or abscess could have this appearance. 4. Lung bases appear better aerated than CT scan of 01/03/2017 although there is persistent bilateral lower lung  ground-glass attenuation potentially rated to pulmonary edema or diffuse infection. 5. New focal opacity measuring 2 cm in the left base. Given that this was not present on the study from 5 days ago, it  probably represents atelectasis or infection. 6. Similar appearance 4.3 cm abdominal aortic aneurysm. 7. No overt hydronephrosis and left pelvic transplant kidney. Gas in the bladder lumen presumably related to the  presence of a Foley catheter. 8. Small volume intraperitoneal free fluid with diffuse body wall edema. CT chest 11/30> Air tracking along the trachea, esophagus.  Worsening interstitial opacities, lung fibrosis  MICROBIOLOGY: MRSA PCR 11/14:  Negative Blood Cultures x2 11/14:  Negative Tracheal Aspirate Culture 11/16:  Normal Respiratory Flora  Respiratory Panel PCR 11/15:  Negative  Influenza A/B PCR  11/14:  Negative  Urine Streptococcal Antigen 11/14:  Negative  Urine Legionella Antigen 11/14:  Negative   ANTIBIOTICS: Azithromycin 11/14 - 11/15 Rocephin 11/14 - 11/15 Vancomycin 11/14 - 11/18 Zosyn 11/15 - 11/22 Vanco 12/1 > Zosyn 12/1 >  SIGNIFICANT EVENTS: 11/14 - Admit 11/16 - Transfer to ICU with worsening respiratory status 11/18 - Changed to pressure control ventilation  11/20 - Didn't tolerate attempt to switch back from Pressure Control to PRVC. Heparin drip stopped w/ anemia.  11/21 -  Started Seroquel 52m BID.  11/23 - More calm and awake today. Tolerating PS 5/5 today w/ TV >800cc.  11/25 - Following commands today & more calm. Tolerating PS 5/5 again today. 11/26 - replacing foley, mild abdominal distention 11/28 - upper endoscopy with moderate gastritis  11/30- Failed extubation. Trach done  ASSESSMENT/PLAN:  71y.o.  female with chronic immunosuppression, renal transplant, ILD, afib. Admitted with acute resp failure presumed secondary to HCAP She has had a prolonged hospitalization.  Failed extubation and had a trach placed CT scan shows mediastinal air.  PULMONARY A: Acute on chronic respiratory failure HCAP, aspiration pneumonia Status post trach Mediastinal air P:   Follow up CXR Esophagram to eval for esophageal tear Start vanco, zosyn to treat aspiration, mediastinitis.  Follow cultures, Pct  CARDIOVASCULAR A:  Atrial fibrillation Pulmonary HTN Acute on chronic diastolic congestive heart failure P:  Rate control with cardizem drip and lopressor Restart heparin gtt Lasix for diuresis  RENAL A:   Renal failure s/p kidney transplant P:   Follow urine output and Cr Continue imuran and steroids  GASTROINTESTINAL A:   Melena, GIB Has  gastritis on EGD P:   Continue PPI bid Follow CBC Holding NG tube until esophagram can be done  HEMATOLOGIC A:   Leukocytosis Anemia P:  Follow CBC  INFECTIOUS A:   HCAP, aspiration PNA P:   Restart antibiotics Follow Pct  ENDOCRINE A:   Adrenal insufficieny, on chronic steroids  P:   Continue prednisone Lantus, SSI coverage  NEUROLOGIC A:   Anxiety, encephalopahty P:   Fentanyl gtt, vesed PRN Continue seroquel, prozac  FAMILY  - Updates: Family updated - Inter-disciplinary family meet or Palliative Care meeting due by:    The patient is critically ill with multiple organ system failure and requires high complexity decision making for assessment and support, frequent evaluation and  titration of therapies, advanced monitoring, review of radiographic studies and interpretation of complex data.   Critical Care Time devoted to patient care services, exclusive of separately billable procedures, described in this note is 35 minutes.   Marshell Garfinkel MD [facility] Pager [phone] If no answer or after 3pm call: [phone] 01/20/2017, 8:33 AM

## 2017-01-20 NOTE — Progress Notes (Signed)
ANTICOAGULATION CONSULT NOTE - Initial Consult  Pharmacy Consult for heparin Indication: atrial fibrillation  Allergies  Allergen Reactions  . Amoxicillin Diarrhea    Has patient had a PCN reaction causing immediate rash, facial/tongue/throat swelling, SOB or lightheadedness with hypotension: No Has patient had a PCN reaction causing severe rash involving mucus membranes or skin necrosis: No Has patient had a PCN reaction that required hospitalization No Has patient had a PCN reaction occurring within the last 10 years: Yes If all of the above answers are "NO", then may proceed with Cephalosporin use.   . Tape Rash    Patient Measurements: Height: 5\' 5"  (165.1 cm) Weight: 119 lb 7.8 oz (54.2 kg) IBW/kg (Calculated) : 57 Heparin Dosing Weight: 54.2 kg  Vital Signs: Temp: 99.6 F (37.6 C) (12/01 0844) Temp Source: Oral (12/01 0844) BP: 146/90 (12/01 0830) Pulse Rate: 118 (12/01 0830)  Labs: Recent Labs    01/18/17 0254 01/19/17 0836 01/20/17 0854  HGB 8.9* 9.1* 11.0*  HCT 28.2* 27.7* 34.2*  PLT 331 376 608*  CREATININE 2.19* 2.13*  --     Estimated Creatinine Clearance: 20.4 mL/min (A) (by C-G formula based on SCr of 2.13 mg/dL (H)).   Medical History: Past Medical History:  Diagnosis Date  . Anxiety   . Arthritis    hips, legs, hand   . Barrett's esophagus   . Bone pain   . Bruises easily   . Cancer ([place])    skin cancer that has been removed- L hand  . CHF (congestive heart failure) ([place])   . Chills   . CVA (cerebral infarction)    Mini Strokes   . Depression   . Disturbed concentration   . Diverticulitis of large intestine with perforation Hx colectomy  . Esophageal stricture   . Fatigue   . Forgetfulness   . Gastroparesis   . Generalized headaches   . GERD (gastroesophageal reflux disease)   . Heart failure, diastolic, chronic ([place])   . History of hiatal hernia   . Hx of cardiovascular stress test    Lexiscan Myoview (06/2013):  No ischemia, EF  72%; Low Risk  . Hyperlipemia   . Hypertension   . Irritable   . Itch    skin  . Keratosis   . Macular degeneration    left eye  . Nausea    little vomitting  . Osteoporosis   . Pancreatic cyst   . Pancreatic mass   . Panic attacks   . Persistent headaches   . Pulmonary hypertension ([place])   . Rapid heart rate    some times  . Renal failure    transplant - 1983,  followed by Dr. Justin Mend   . Sleep apnea    uses at bedtime- 2liters/min , negative- apnea., last study- 2/017  . Sleep difficulties   . Stroke ([place])   . TIA (transient ischemic attack)      Assessment: Pt is on Eliquis pta for AFib. Last dose was on 11/13. All anticoagulation has been held during admission due to melena. The pt had an EGD which found gastritis as likely cause of melena, no overt bleeding. Remains on ppi bid, melena has resolved. She received 1 unit of prbc on 11/27. hgb 11, plts wnl, will resume heparin infusion today.   Goal of Therapy:  Heparin level 0.3-0.7 units/ml Monitor platelets by anticoagulation protocol: Yes    Plan:  Heparin 1100 units/hr Daily HL, CBC Level this afternoon   [name] 01/20/2017,9:38 AM

## 2017-01-20 NOTE — Plan of Care (Signed)
  Progressing Clinical Measurements: Ability to maintain clinical measurements within normal limits will improve 01/20/2017 0502 - Progressing by Randal Buba, RN Will remain free from infection 01/20/2017 0502 - Progressing by Randal Buba, RN Diagnostic test results will improve 01/20/2017 0502 - Progressing by Randal Buba, RN Respiratory complications will improve 01/20/2017 0502 - Progressing by Randal Buba, RN Cardiovascular complication will be avoided 01/20/2017 0502 - Progressing by Randal Buba, RN Coping: Level of anxiety will decrease 01/20/2017 0502 - Progressing by Randal Buba, RN Elimination: Will not experience complications related to urinary retention 01/20/2017 0502 - Progressing by Randal Buba, RN Pain Managment: General experience of comfort will improve 01/20/2017 0502 - Progressing by Randal Buba, RN

## 2017-01-20 NOTE — Procedures (Addendum)
Bedside Bronch for trach placement  Procedure done by Dr. Vaughan Browner. At first bronch was introduce through ET tube and structures of tracheal rings, carina identified for operator of tracheostomy who was Dr Nelda Marseille. Under bronchoscopy guidance,  ET tube was pulled back sufficiently and very carefully. The ET tube was  pulled back enough to give room for tracheostomy operator and yet at same time to to ensure a secured airway. After this was accomplished, bronchoscope was withdrawn into the ET tube. After this,  Dr Nelda Marseille then performed tracheostomy under video visual provided by flexible video bronchoscopy. Followng introduction of tracheostomy,  the bronchoscope was removed from ET tube and introduced through tracheostomy. Correct position of tracheostomy was ensured, with enough room between carina and distal tracheostomy and no evidence of bleeding. The bronchoscope was then withdrawn. Respiratory therapist was then instructed to remove the ET tube.  Dr Nelda Marseille then proceeded to complete the tracheostomy with stay sutures.  No complications   Morgan Garfinkel MD [facility] Pager [phone] If no answer or after 3pm call: [phone] 01/20/2017, 7:51 AM

## 2017-01-20 NOTE — Progress Notes (Signed)
Cortrak Tube Team Note:  Consult received to place a Cortrak feeding tube.   A 10 F Cortrak tube was placed in the R nare and secured with a nasal bridle at 65 cm. Per the Cortrak monitor reading the tube tip is distal stomach.   No x-ray is required. RN may begin using tube.   If the tube becomes dislodged please keep the tube and contact the Cortrak team at [email or web address] ([email or web address]) for replacement.  If after hours and replacement cannot be delayed, place a NG tube and confirm placement with an abdominal x-ray.   Burtis Junes RD, LDN, CNSC Clinical Nutrition Pager: [phone] 01/20/2017 12:35 PM

## 2017-01-20 NOTE — Progress Notes (Signed)
Progress Note  Patient Name: Morgan Cooper Date of Encounter: 01/20/2017  Primary Cardiologist: Dr. Irish Lack  Subjective   Awake but still intubated but anxious  Inpatient Medications    Scheduled Meds: . atorvastatin  20 mg Per Tube Daily  . azaTHIOprine  50 mg Oral Daily  . chlorhexidine gluconate (MEDLINE KIT)  15 mL Mouth Rinse BID  . Chlorhexidine Gluconate Cloth  6 each Topical Daily  . cycloSPORINE  1 drop Both Eyes BID  . FLUoxetine  60 mg Per Tube Daily  . free water  300 mL Per Tube Q4H  . furosemide  40 mg Intravenous BID  . insulin aspart  0-15 Units Subcutaneous Q4H  . insulin glargine  10 Units Subcutaneous QHS  . mouth rinse  15 mL Mouth Rinse QID  . metoprolol tartrate  100 mg Per Tube BID  . pantoprazole sodium  40 mg Per Tube BID  . predniSONE  7.5 mg Per Tube Q breakfast  . QUEtiapine  100 mg Per Tube QHS  . sodium chloride flush  10-40 mL Intracatheter Q12H   Continuous Infusions: . sodium chloride 10 mL/hr at 01/19/17 1200  . diltiazem (CARDIZEM) infusion 15 mg/hr (01/20/17 1006)  . feeding supplement (VITAL AF 1.2 CAL) Stopped (01/19/17 0800)  . fentaNYL infusion INTRAVENOUS 125 mcg/hr (01/19/17 1850)  . heparin    . piperacillin-tazobactam (ZOSYN)  IV 2.25 g (01/20/17 1007)  . [START ON 01/22/2017] vancomycin     PRN Meds: Place/Maintain arterial line **AND** sodium chloride, fentaNYL, fentaNYL (SUBLIMAZE) injection, levalbuterol, metoprolol tartrate, midazolam, ondansetron (ZOFRAN) IV, sodium chloride flush   Vital Signs    Vitals:   01/20/17 0800 01/20/17 0809 01/20/17 0830 01/20/17 0844  BP: (!) 143/88  (!) 146/90   Pulse: (!) 123  (!) 118   Resp: (!) 26  (!) 27   Temp:    99.6 F (37.6 C)  TempSrc:    Oral  SpO2: 97% 98% 98%   Weight:      Height:        Intake/Output Summary (Last 24 hours) at 01/20/2017 1127 Last data filed at 01/20/2017 0600 Gross per 24 hour  Intake 516.66 ml  Output 3115 ml  Net -2598.34 ml   Filed  Weights   01/18/17 0500 01/19/17 0500 01/20/17 0441  Weight: 137 lb 9.1 oz (62.4 kg) 132 lb 7.9 oz (60.1 kg) 119 lb 7.8 oz (54.2 kg)    Telemetry    Atrial fib with a RVR - Personally Reviewed  ECG    Atrial fib with a RVR - Personally Reviewed  Physical Exam   GEN: No acute distress.   Neck: 8 cm JVD Cardiac: IRIRR, no murmurs, rubs, or gallops.  Respiratory: Clear to auscultation bilaterally. GI: Soft, nontender, non-distended  MS: No edema; No deformity. Neuro:  Nonfocal  Psych: Normal affect   Labs    Chemistry Recent Labs  Lab 01/14/17 0505 01/15/17 0358 01/16/17 0733  01/18/17 0254 01/19/17 0836 01/20/17 0854  NA 150* 148* 147*   < > 144 140 142  K 3.2* 4.3 4.3   < > 4.7 4.1 4.6  CL 114* 115* 113*   < > 110 104 104  CO2 _0 < > _1 GLUCOSE 139* 142* 135*   < > 174* 159* 151*  BUN 145* 124* 106*   < > 96* 85* 76*  CREATININE 2.89* 2.59* 2.35*   < > 2.19* 2.13* 2.14*  CALCIUM 8.4*  8.5* 8.8*   < > 8.6* 8.5* 9.2  ALBUMIN 1.9* 2.1* 2.2*  --   --   --   --   GFRNONAA 15* 17* 20*   < > 21* 22* 22*  GFRAA 18* 20* 23*   < > 25* 26* 25*  ANIONGAP _0 < > _1 < > = values in this interval not displayed.     Hematology Recent Labs  Lab 01/18/17 0254 01/19/17 0836 01/20/17 0854  WBC 13.6* 19.5* 19.8*  RBC 2.98* 2.97* 3.61*  HGB 8.9* 9.1* 11.0*  HCT 28.2* 27.7* 34.2*  MCV 94.6 93.3 94.7  MCH 29.9 30.6 30.5  MCHC 31.6 32.9 32.2  RDW 16.8* 16.0* 15.5  PLT 331 376 608*    Cardiac EnzymesNo results for input(s): TROPONINI in the last 168 hours. No results for input(s): TROPIPOC in the last 168 hours.   BNPNo results for input(s): BNP, PROBNP in the last 168 hours.   DDimer No results for input(s): DDIMER in the last 168 hours.   Radiology    Ct Chest Wo Contrast  Addendum Date: 01/19/2017   ADDENDUM REPORT: 01/19/2017 22:52 ADDENDUM: Findings called to Dr. Charisse Klinefelter. Electronically Signed   By: Dorise Bullion III M.D   On:  01/19/2017 22:52   Result Date: 01/19/2017 CLINICAL DATA:  Evaluate tracheostomy tube. EXAM: CT CHEST WITHOUT CONTRAST TECHNIQUE: Multidetector CT imaging of the chest was performed following the standard protocol without IV contrast. COMPARISON:  CT scan January 03, 2017. Chest x-ray from earlier today. CT the abdomen January 08, 2017. FINDINGS: Cardiovascular: Cardiomegaly is identified with coronary artery calcifications. There is a small left pleural effusion which is larger in the interval. A small right pleural effusion is similar in the interval. No pericardial effusion is noted. The thoracic aorta is unchanged with atherosclerotic change. The main pulmonary artery measures 3.8 cm which is stable and enlarged. Mediastinum/Nodes: A tracheostomy tube is identified, entering the trachea anteriorly just below the thyroid. The tracheostomy tube terminates in the trachea with no narrowing of the airway. There is significant mediastinal air adjacent to the trachea and esophagus extending to the level the left mainstem bronchus. There is also extension into the neck extending to the level of the thyroid. A mildly prominent precarinal node may be reactive. No other adenopathy. Lungs/Pleura: Bilateral pleural effusions. Bilateral pulmonary infiltrates. The infiltrate in the right upper lobe has improved. Infiltrate in the right middle and lower lobes has worsened. There is also mild infiltrate in the left upper lobe which is new. Emphysematous changes are identified. A bulla is seen in the left base, unchanged. Upper Abdomen: The left kidney is atrophic and poorly evaluated. Postsurgical changes are seen in the upper abdomen. There is a tubular region of mixed attenuation posterior to the stomach, in the region of the pancreatic tail. The high attenuation suggests the possibility of blood products. The pancreatic tail has been resected. This finding has persisted since January 08, 2017. This abnormality measures  up to 5.5 cm on series 4, image 137. There is a small amount of fluid adjacent to the spleen but the spleen appears to be grossly intact. Musculoskeletal: There is a healed sternal fracture proximally. There is discontinuity of the sternum more inferiorly as seen on sagittal image 64. There is motion in this region which could account for the finding. Recommend clinical correlation to exclude signs of fracture which is considered less likely given the motion. IMPRESSION: 1. Mediastinal  air tracking along the trachea and esophagus. The tracheostomy tube is in place as above. 2. Infiltrates in the left upper lobe on bilateral lower lobes have worsened as well there has been improvement in the right upper lobe. Recommend follow-up to resolution. 3. Tubular mixed attenuation finding in the left upper quadrant is consistent with a fluid collection, probably containing blood products given the high attenuation. This finding has persisted since January 08, 2017 consistent with distal pancreatectomy. 4. Cardiomegaly.  Small bilateral effusions. 5. The main pulmonary artery is enlarged suggesting pulmonary hypertension. 6. Discontinuity of the mid sternum. I suspect this is artifactual given motion in this location. Recommend clinical correlation. The findings will be called to the referring physician. Aortic Atherosclerosis (ICD10-I70.0) and Emphysema (ICD10-J43.9). Electronically Signed: By: Dorise Bullion III M.D On: 01/19/2017 18:37   Dg Chest Port 1 View  Addendum Date: 01/20/2017   ADDENDUM REPORT: 01/20/2017 10:55 ADDENDUM: Reduction in the mediastinum gas identified within the thoracic inlet on comparison exam. No clear in pneumomediastinum identified. Electronically Signed   By: Suzy Bouchard M.D.   On: 01/20/2017 10:55   Result Date: 01/20/2017 CLINICAL DATA:  acute respiratory failure EXAM: PORTABLE CHEST 1 VIEW COMPARISON:  Chest radiograph 01/19/2017, CT 01/19/2017 FINDINGS: Tracheostomy tube in good  position. Stable large cardiac silhouette. Dense LEFT basilar consolidation new with cystic change is similar to comparison CT and radiograph. Patchy airspace disease in the RIGHT lower lobe also unchanged. No pneumothorax. IMPRESSION: 1. Dense bibasilar pneumonia unchanged. 2. Tracheostomy tube in good position. Electronically Signed: By: Suzy Bouchard M.D. On: 01/20/2017 08:25   Dg Chest Port 1 View  Result Date: 01/19/2017 CLINICAL DATA:  Tracheostomy EXAM: PORTABLE CHEST 1 VIEW COMPARISON:  01/19/2017 FINDINGS: Interval placement of tracheostomy. Gas to the left of the tracheostomy may be within the trachea or mediastinum. No definite pneumothorax. Severe diffuse bilateral airspace disease is unchanged. IMPRESSION: Tracheostomy placement. Gas shadow to the left of the tracheostomy may be within the trachea or mediastinum. Attention on follow-up Severe bilateral airspace disease unchanged. Electronically Signed   By: Franchot Gallo M.D.   On: 01/19/2017 15:43   Dg Chest Port 1 View  Result Date: 01/19/2017 CLINICAL DATA:  Shortness of breath. EXAM: PORTABLE CHEST 1 VIEW COMPARISON:  01/19/2017 .  01/18/2017 FINDINGS: Interim extubation and removal of NG tube. Cardiomegaly with diffuse bilateral interstitial prominence and bilateral pleural effusions. Interstitial prominence slightly increased from prior exam. Findings consistent with slightly worsening CHF. Other etiologies of interstitial disease including pneumonitis cannot be excluded. IMPRESSION: Cardiomegaly with diffuse pulmonary interstitial prominence. Slight worsening on today's exam. Bilateral pleural effusions again noted. Findings consistent with slightly worsening CHF. Electronically Signed   By: Marcello Moores  Register   On: 01/19/2017 13:48   Dg Chest Port 1 View  Result Date: 01/19/2017 CLINICAL DATA:  Respiratory failure. EXAM: PORTABLE CHEST 1 VIEW COMPARISON:  01/18/2017 FINDINGS: The heart is enlarged but stable. There is tortuosity  and calcification of the thoracic aorta. Persistent and slight worsening diffuse airspace process, cystic air spaces and left pleural effusion. The endotracheal tube and ET tubes are stable. IMPRESSION: Slight worsening diffuse airspace process. Stable support apparatus. Electronically Signed   By: Marijo Sanes M.D.   On: 01/19/2017 08:04    Cardiac Studies   none  Patient Profile     71 y.o. female admitted with respiratory failure, now with trach tube in place  Assessment & Plan    1. Atrial fib with a RVR - her rates  are reasonably well controlled in the low 100's on IV cardizem and lopressor.  2. Chronic diastolic heart failure - this is part of her problem. She will continue lasix as tolerated IV.  3. Chronic renal failure, s/p renal transplant - as per nephrology service. Her creatinine is slowly improving.  For questions or updates, please contact [place] Please consult [email or web address] for contact info under Cardiology/STEMI.      Signed, Cristopher Peru, MD  01/20/2017, 11:27 AM  Patient ID: Morgan Cooper, female   DOB: April 11, 1945, 71 y.o.   MRN: [id number]

## 2017-01-20 NOTE — Care Management Note (Signed)
Case Management Note  Patient Details  Name: MINDEL FRISCIA MRN: [id number] Date of Birth: [birth year]  Subjective/Objective: Percutaneous Size 6 Cuffed Shiley placed after failed Extubation trial 01/19/2017. Awaiting LTACH referral outcomes.                    Action/Plan:CM will continue to follow for Discharge/Disposition needs.   Expected Discharge Date:                  Expected Discharge Plan:  Long Term Acute Care (LTAC)  In-House Referral:  Clinical Social Work  Discharge planning Services  CM Consult  Post Acute Care Choice:    Choice offered to:     DME Arranged:    DME Agency:     HH Arranged:    [place] Agency:     Status of Service:  In process, will continue to follow  If discussed at Long Length of Stay Meetings, dates discussed:    Additional Comments:  Delrae Sawyers, RN 01/20/2017, 1:20 PM

## 2017-01-20 NOTE — Progress Notes (Signed)
Nutrition Follow-up  DOCUMENTATION CODES: = Severe malnutrition in context of chronic illness  INTERVENTION:  Initiate TF via NGT with Nepro @ 25 cc/hr (600 ml per day) and 30 ml Prostat BID to provide 1280 kcals, 79 gm protein, 436 ml free water daily.  NUTRITION DIAGNOSIS:  Severe Malnutrition related to chronic illness(CHF) as evidenced by severe muscle depletion, severe fat depletion.  GOAL:  Patient will meet greater than or equal to 90% of their needs  MONITOR:  TF tolerance, I & O's, Diet advancement, Vent status  ASSESSMENT:  Pt with PMH of Barrett's espohagus, HTN, GERD, osteoporosis, CHF, hx of pancreatic mass s/p pancreatectomy October 2017, hx of kidney transplant over 40 years ago,  presents with SOB and A.Fib with RVR.  Patient just seen yesterday after extubation, however, pt developed respiratory failure only a few hours later and is now s/p emergent tracheostomy and placed back onto vent support.   Planned cortrak placement following esophogram.   Dry weight this admission is 110.7 lbs.   Her phos is currently over 7, has been rising since admit and since on Vital. Will try nepro to see if impacts. Will help with less fluid overload as well.   Patient is currently intubated on ventilator support MV: 9.3 L/min Temp (24hrs), Avg:98.4 F (36.9 C), Min:97.7 F (36.5 C), Max:99.6 F (37.6 C) Propofol: None  Labs: WBC: 19.8, Phos: 7.2, K: 4.6, Na:142, BUN/Creat:76/2.14, Glu:145-165 Meds: PPi, seroquel, prednisone,  Imuran, Lasix, Insulin, Fentanyl, IV abx, IVF  Recent Labs  Lab 01/15/17 0358 01/16/17 0733  01/18/17 0254 01/19/17 0836 01/20/17 0854  NA 148* 147*   < > 144 140 142  K 4.3 4.3   < > 4.7 4.1 4.6  CL 115* 113*   < > 110 104 104  CO2 25 24   < > 25 25 24   BUN 124* 106*   < > 96* 85* 76*  CREATININE 2.59* 2.35*   < > 2.19* 2.13* 2.14*  CALCIUM 8.5* 8.8*   < > 8.6* 8.5* 9.2  MG 2.4  --   --   --  1.9 2.2  PHOS 4.9* 4.9*  --   --   --  7.2*   GLUCOSE 142* 135*   < > 174* 159* 151*   < > = values in this interval not displayed.   Diet Order:  Diet NPO time specified  EDUCATION NEEDS:  No education needs have been identified at this time  Skin:  Skin Integrity Issues:: DTI DTI: sacrum Stage I: sacrum  Last BM:  Flexiseal  Height:  Ht Readings from Last 1 Encounters:  01/03/17 5\' 5"  (1.651 m)   Weight:  Wt Readings from Last 1 Encounters:  01/20/17 119 lb 7.8 oz (54.2 kg)   Wt Readings from Last 10 Encounters:  01/20/17 119 lb 7.8 oz (54.2 kg)  07/11/16 104 lb 9.6 oz (47.4 kg)  02/11/16 107 lb 12.8 oz (48.9 kg)  02/08/16 107 lb (48.5 kg)  02/01/16 109 lb 4.8 oz (49.6 kg)  01/20/16 110 lb (49.9 kg)  01/06/16 110 lb (49.9 kg)  12/23/15 113 lb 9.6 oz (51.5 kg)  12/07/15 129 lb 13.6 oz (58.9 kg)  11/30/15 122 lb 12.8 oz (55.7 kg)   Ideal Body Weight:  56.8 kg  BMI:  Body mass index is 19.88 kg/m.  Estimated Nutritional Needs:  Kcal:  1330 (PSU 2003b) Protein:  75-90 (1.5-1.8 g/kg bw) Fluid:  Per MD    Burtis Junes RD, LDN, CNSC  Clinical Nutrition Pager: [phone] 01/20/2017 12:14 PM

## 2017-01-20 NOTE — Progress Notes (Signed)
Pharmacy Antibiotic Note  Morgan Cooper is a 71 y.o. female admitted on 01/03/2017 with resp failure. Now restarting abx for HCAP. WBC 19.5, afebrile. See below for all cultures and abx from admission. Planning to resume antibiotics today. eCrCl ~20 ml/min.    Plan: -vancomycin 1 g IV x1 then 750 mg IV q48h -zosyn 2.25 g IV q8h -Monitor renal fx, cultures, duration of therapy   Height: 5\' 5"  (165.1 cm) Weight: 119 lb 7.8 oz (54.2 kg) IBW/kg (Calculated) : 57  Temp (24hrs), Avg:98.3 F (36.8 C), Min:97.7 F (36.5 C), Max:99.6 F (37.6 C)  Recent Labs  Lab 01/15/17 0358 01/16/17 0733 01/17/17 0436 01/18/17 0254 01/19/17 0836  WBC 14.8* 16.9* 16.7* 13.6* 19.5*  CREATININE 2.59* 2.35* 2.15* 2.19* 2.13*      Allergies  Allergen Reactions  . Amoxicillin Diarrhea    Has patient had a PCN reaction causing immediate rash, facial/tongue/throat swelling, SOB or lightheadedness with hypotension: No Has patient had a PCN reaction causing severe rash involving mucus membranes or skin necrosis: No Has patient had a PCN reaction that required hospitalization No Has patient had a PCN reaction occurring within the last 10 years: Yes If all of the above answers are "NO", then may proceed with Cephalosporin use.   . Tape Rash     Antimicrobials this admission: Azith 11/14 >> 11/16 Rocephin 11/14 >> 11/16 Vanc 11/16 >> 11/18; 12/1 > Zosyn 11/16 >> 11/22; 12/1 >   Dose adjustments this admission: N/A   Microbiology results: 11/14 HIV / Strep pneumo / Legionella - negative 11/14 MRSA PCR - negative 11/14 BCx - negative 11/15 resp panel - negative 11/16 TA - normal flora 11/16 pneumocystic smear - negative 11/27 resp cx - pending 12/1 blood cx: 12/1 urine cx:   Morgan Cooper 01/20/2017 8:49 AM

## 2017-01-20 NOTE — Progress Notes (Signed)
71 y.o.year-old with hx of ESRD sp renal transplant (LRD, 1984, pred/ imuran) Presented 11/14 w/SOB/ DOE and some mild CP. Recent new AF on BB and Eliquis. Hx of dCHF, not on diuretics PTA. Hypoxic on adm. BNP was 2000, initial Rx IV diltiazem for afib /RVR,not felt to be vol overloaded. CT angio w/ contrast no PE but RUL air space disease, pulm edema vs infection. Lactic acid 1.45, WBC 9k. Admitted, rx'd with IV diltiazem, po metoprolol, IV abx (rocephin, vanc/ zosyn) for PNA. Intubated 11/15. Admit creat 0.8, 2.2 on 11/16, 3.14 11/18 w/oliguria. Weight up 50->53 kg. CXR diffuse bilat infiltrates, R>L . Hypotension on levo gtt for about 24 hrs.BP's in low 100 range. ECHO showed normal LVEF. Asked to see for AKI.   Impression: 1. AKI -creatininestalled 2. Renal transplant with dysfunction - Creat normal 0.8. Peak creatinine 4.3.Home meds: imuran and prednisone (now at 7.5 mg). 3. Hypernatremia, resolved 4. VDRF - s/p emergency tracheostomy after extubation 11/30 5. Multifocal PNA / hx pulm fibrosis and COPD  6. AF with RVR,recurrent 7. dCHF, getting diuresed and I think she has volume on board still 8. Anemia -transfused, Endo donew/ diffuse gastritis 9.   Diarrhea -resolved  Subjective: Interval History: s/p emergency trach  Objective: Vital signs in last 24 hours: Temp:  [97.7 F (36.5 C)-99.6 F (37.6 C)] 99.6 F (37.6 C) (12/01 0844) Pulse Rate:  [52-123] 118 (12/01 0830) Resp:  [15-30] 27 (12/01 0830) BP: (81-151)/(56-110) 146/90 (12/01 0830) SpO2:  [85 %-100 %] 98 % (12/01 0830) FiO2 (%):  [40 %-100 %] 50 % (12/01 0809) Weight:  [54.2 kg (119 lb 7.8 oz)] 54.2 kg (119 lb 7.8 oz) (12/01 0441) Weight change: -5.9 kg (-0.1 oz)  Intake/Output from previous day: 11/30 0701 - 12/01 0700 In: 916.7 [I.V.:556.7; NG/GT:360] Out: 3415 [Urine:3015; Stool:400] Intake/Output this shift: No intake/output data recorded.  General appearance: sedated Head: Normocephalic,  without obvious abnormality, atraumatic, trach Extremities: edema knee wit 1+ pitting  Lab Results: Recent Labs    01/19/17 0836 01/20/17 0854  WBC 19.5* 19.8*  HGB 9.1* 11.0*  HCT 27.7* 34.2*  PLT 376 608*   BMET:  Recent Labs    01/18/17 0254 01/19/17 0836  NA 144 140  K 4.7 4.1  CL 110 104  CO2 25 25  GLUCOSE 174* 159*  BUN 96* 85*  CREATININE 2.19* 2.13*  CALCIUM 8.6* 8.5*   No results for input(s): PTH in the last 72 hours. Iron Studies: No results for input(s): IRON, TIBC, TRANSFERRIN, FERRITIN in the last 72 hours. Studies/Results: Ct Chest Wo Contrast  Addendum Date: 01/19/2017   ADDENDUM REPORT: 01/19/2017 22:52 ADDENDUM: Findings called to Dr. Charisse Klinefelter. Electronically Signed   By: Dorise Bullion III M.D   On: 01/19/2017 22:52   Result Date: 01/19/2017 CLINICAL DATA:  Evaluate tracheostomy tube. EXAM: CT CHEST WITHOUT CONTRAST TECHNIQUE: Multidetector CT imaging of the chest was performed following the standard protocol without IV contrast. COMPARISON:  CT scan January 03, 2017. Chest x-ray from earlier today. CT the abdomen January 08, 2017. FINDINGS: Cardiovascular: Cardiomegaly is identified with coronary artery calcifications. There is a small left pleural effusion which is larger in the interval. A small right pleural effusion is similar in the interval. No pericardial effusion is noted. The thoracic aorta is unchanged with atherosclerotic change. The main pulmonary artery measures 3.8 cm which is stable and enlarged. Mediastinum/Nodes: A tracheostomy tube is identified, entering the trachea anteriorly just below the thyroid. The tracheostomy tube terminates in  the trachea with no narrowing of the airway. There is significant mediastinal air adjacent to the trachea and esophagus extending to the level the left mainstem bronchus. There is also extension into the neck extending to the level of the thyroid. A mildly prominent precarinal node may be reactive. No other  adenopathy. Lungs/Pleura: Bilateral pleural effusions. Bilateral pulmonary infiltrates. The infiltrate in the right upper lobe has improved. Infiltrate in the right middle and lower lobes has worsened. There is also mild infiltrate in the left upper lobe which is new. Emphysematous changes are identified. A bulla is seen in the left base, unchanged. Upper Abdomen: The left kidney is atrophic and poorly evaluated. Postsurgical changes are seen in the upper abdomen. There is a tubular region of mixed attenuation posterior to the stomach, in the region of the pancreatic tail. The high attenuation suggests the possibility of blood products. The pancreatic tail has been resected. This finding has persisted since January 08, 2017. This abnormality measures up to 5.5 cm on series 4, image 137. There is a small amount of fluid adjacent to the spleen but the spleen appears to be grossly intact. Musculoskeletal: There is a healed sternal fracture proximally. There is discontinuity of the sternum more inferiorly as seen on sagittal image 64. There is motion in this region which could account for the finding. Recommend clinical correlation to exclude signs of fracture which is considered less likely given the motion. IMPRESSION: 1. Mediastinal air tracking along the trachea and esophagus. The tracheostomy tube is in place as above. 2. Infiltrates in the left upper lobe on bilateral lower lobes have worsened as well there has been improvement in the right upper lobe. Recommend follow-up to resolution. 3. Tubular mixed attenuation finding in the left upper quadrant is consistent with a fluid collection, probably containing blood products given the high attenuation. This finding has persisted since January 08, 2017 consistent with distal pancreatectomy. 4. Cardiomegaly.  Small bilateral effusions. 5. The main pulmonary artery is enlarged suggesting pulmonary hypertension. 6. Discontinuity of the mid sternum. I suspect this is  artifactual given motion in this location. Recommend clinical correlation. The findings will be called to the referring physician. Aortic Atherosclerosis (ICD10-I70.0) and Emphysema (ICD10-J43.9). Electronically Signed: By: Dorise Bullion III M.D On: 01/19/2017 18:37   Dg Chest Port 1 View  Result Date: 01/20/2017 CLINICAL DATA:  acute respiratory failure EXAM: PORTABLE CHEST 1 VIEW COMPARISON:  Chest radiograph 01/19/2017, CT 01/19/2017 FINDINGS: Tracheostomy tube in good position. Stable large cardiac silhouette. Dense LEFT basilar consolidation new with cystic change is similar to comparison CT and radiograph. Patchy airspace disease in the RIGHT lower lobe also unchanged. No pneumothorax. IMPRESSION: 1. Dense bibasilar pneumonia unchanged. 2. Tracheostomy tube in good position. Electronically Signed   By: Suzy Bouchard M.D.   On: 01/20/2017 08:25   Dg Chest Port 1 View  Result Date: 01/19/2017 CLINICAL DATA:  Tracheostomy EXAM: PORTABLE CHEST 1 VIEW COMPARISON:  01/19/2017 FINDINGS: Interval placement of tracheostomy. Gas to the left of the tracheostomy may be within the trachea or mediastinum. No definite pneumothorax. Severe diffuse bilateral airspace disease is unchanged. IMPRESSION: Tracheostomy placement. Gas shadow to the left of the tracheostomy may be within the trachea or mediastinum. Attention on follow-up Severe bilateral airspace disease unchanged. Electronically Signed   By: Franchot Gallo M.D.   On: 01/19/2017 15:43   Dg Chest Port 1 View  Result Date: 01/19/2017 CLINICAL DATA:  Shortness of breath. EXAM: PORTABLE CHEST 1 VIEW COMPARISON:  01/19/2017 .  01/18/2017 FINDINGS: Interim extubation and removal of NG tube. Cardiomegaly with diffuse bilateral interstitial prominence and bilateral pleural effusions. Interstitial prominence slightly increased from prior exam. Findings consistent with slightly worsening CHF. Other etiologies of interstitial disease including pneumonitis  cannot be excluded. IMPRESSION: Cardiomegaly with diffuse pulmonary interstitial prominence. Slight worsening on today's exam. Bilateral pleural effusions again noted. Findings consistent with slightly worsening CHF. Electronically Signed   By: Marcello Moores  Register   On: 01/19/2017 13:48   Dg Chest Port 1 View  Result Date: 01/19/2017 CLINICAL DATA:  Respiratory failure. EXAM: PORTABLE CHEST 1 VIEW COMPARISON:  01/18/2017 FINDINGS: The heart is enlarged but stable. There is tortuosity and calcification of the thoracic aorta. Persistent and slight worsening diffuse airspace process, cystic air spaces and left pleural effusion. The endotracheal tube and ET tubes are stable. IMPRESSION: Slight worsening diffuse airspace process. Stable support apparatus. Electronically Signed   By: Marijo Sanes M.D.   On: 01/19/2017 08:04    Scheduled: . atorvastatin  20 mg Per Tube Daily  . azaTHIOprine  50 mg Oral Daily  . chlorhexidine gluconate (MEDLINE KIT)  15 mL Mouth Rinse BID  . Chlorhexidine Gluconate Cloth  6 each Topical Daily  . cycloSPORINE  1 drop Both Eyes BID  . FLUoxetine  60 mg Per Tube Daily  . free water  300 mL Per Tube Q4H  . furosemide  40 mg Intravenous BID  . insulin aspart  0-15 Units Subcutaneous Q4H  . insulin glargine  10 Units Subcutaneous QHS  . mouth rinse  15 mL Mouth Rinse QID  . metoprolol tartrate  100 mg Per Tube BID  . pantoprazole sodium  40 mg Per Tube BID  . predniSONE  7.5 mg Per Tube Q breakfast  . QUEtiapine  100 mg Per Tube QHS  . sodium chloride flush  10-40 mL Intracatheter Q12H     LOS: 17 days   Estanislado Emms 01/20/2017,10:44 AM

## 2017-01-21 ENCOUNTER — Inpatient Hospital Stay (HOSPITAL_COMMUNITY): Payer: Medicare Other

## 2017-01-21 LAB — CBC
HEMATOCRIT: 26.4 % — AB (ref 36.0–46.0)
HEMATOCRIT: 28.3 % — AB (ref 36.0–46.0)
HEMOGLOBIN: 8.3 g/dL — AB (ref 12.0–15.0)
HEMOGLOBIN: 9 g/dL — AB (ref 12.0–15.0)
MCH: 30.1 pg (ref 26.0–34.0)
MCH: 30.7 pg (ref 26.0–34.0)
MCHC: 31.4 g/dL (ref 30.0–36.0)
MCHC: 31.8 g/dL (ref 30.0–36.0)
MCV: 95.7 fL (ref 78.0–100.0)
MCV: 96.6 fL (ref 78.0–100.0)
Platelets: 337 10*3/uL (ref 150–400)
Platelets: 321 10*3/uL (ref 150–400)
RBC: 2.76 MIL/uL — AB (ref 3.87–5.11)
RBC: 2.93 MIL/uL — AB (ref 3.87–5.11)
RDW: 15.8 % — ABNORMAL HIGH (ref 11.5–15.5)
RDW: 15.7 % — ABNORMAL HIGH (ref 11.5–15.5)
WBC: 15.3 10*3/uL — ABNORMAL HIGH (ref 4.0–10.5)
WBC: 13.7 10*3/uL — AB (ref 4.0–10.5)

## 2017-01-21 LAB — GLUCOSE, CAPILLARY
GLUCOSE-CAPILLARY: 170 mg/dL — AB (ref 65–99)
GLUCOSE-CAPILLARY: 230 mg/dL — AB (ref 65–99)
Glucose-Capillary: 106 mg/dL — ABNORMAL HIGH (ref 65–99)
Glucose-Capillary: 165 mg/dL — ABNORMAL HIGH (ref 65–99)
Glucose-Capillary: 179 mg/dL — ABNORMAL HIGH (ref 65–99)
Glucose-Capillary: 194 mg/dL — ABNORMAL HIGH (ref 65–99)

## 2017-01-21 LAB — BASIC METABOLIC PANEL
Anion gap: 10 (ref 5–15)
BUN: 84 mg/dL — ABNORMAL HIGH (ref 6–20)
CALCIUM: 8.2 mg/dL — AB (ref 8.9–10.3)
CHLORIDE: 105 mmol/L (ref 101–111)
CO2: 26 mmol/L (ref 22–32)
CREATININE: 1.98 mg/dL — AB (ref 0.44–1.00)
GFR calc Af Amer: 28 mL/min — ABNORMAL LOW (ref >60)
GFR calc non Af Amer: 24 mL/min — ABNORMAL LOW (ref >60)
GLUCOSE: 155 mg/dL — AB (ref 65–99)
Potassium: 3.4 mmol/L — ABNORMAL LOW (ref 3.5–5.1)
Sodium: 141 mmol/L (ref 135–145)

## 2017-01-21 LAB — PHOSPHORUS: Phosphorus: 5.7 mg/dL — ABNORMAL HIGH (ref 2.5–4.6)

## 2017-01-21 LAB — MAGNESIUM: Magnesium: 1.9 mg/dL (ref 1.7–2.4)

## 2017-01-21 LAB — HEPARIN LEVEL (UNFRACTIONATED): Heparin Unfractionated: 0.64 IU/mL (ref 0.30–0.70)

## 2017-01-21 MED ORDER — METOPROLOL TARTRATE 50 MG PO TABS
50.0000 mg | ORAL_TABLET | Freq: Two times a day (BID) | ORAL | Status: DC
  Administered 2017-01-21 – 2017-01-24 (×7): 50 mg
  Filled 2017-01-21 (×8): qty 1

## 2017-01-21 MED ORDER — SUCRALFATE 1 GM/10ML PO SUSP
1.0000 g | Freq: Two times a day (BID) | ORAL | Status: DC
  Administered 2017-01-21 – 2017-01-24 (×7): 1 g
  Filled 2017-01-21 (×8): qty 10

## 2017-01-21 MED ORDER — SODIUM CHLORIDE 0.9 % IV BOLUS (SEPSIS)
1000.0000 mL | Freq: Once | INTRAVENOUS | Status: AC
  Administered 2017-01-21: 1000 mL via INTRAVENOUS

## 2017-01-21 MED ORDER — MAGNESIUM SULFATE 2 GM/50ML IV SOLN
2.0000 g | Freq: Once | INTRAVENOUS | Status: AC
  Administered 2017-01-21: 2 g via INTRAVENOUS
  Filled 2017-01-21: qty 50

## 2017-01-21 MED ORDER — POTASSIUM CHLORIDE 20 MEQ/15ML (10%) PO SOLN
40.0000 meq | Freq: Two times a day (BID) | ORAL | Status: AC
  Administered 2017-01-21 (×2): 40 meq
  Filled 2017-01-21 (×2): qty 30

## 2017-01-21 MED ORDER — FLUOXETINE HCL 20 MG PO CAPS
80.0000 mg | ORAL_CAPSULE | Freq: Every day | ORAL | Status: DC
  Administered 2017-01-21 – 2017-01-24 (×4): 80 mg
  Filled 2017-01-21 (×4): qty 4

## 2017-01-21 NOTE — Progress Notes (Signed)
Morgan Cooper for heparin Indication: atrial fibrillation  Allergies  Allergen Reactions  . Amoxicillin Diarrhea    Has patient had a PCN reaction causing immediate rash, facial/tongue/throat swelling, SOB or lightheadedness with hypotension: No Has patient had a PCN reaction causing severe rash involving mucus membranes or skin necrosis: No Has patient had a PCN reaction that required hospitalization No Has patient had a PCN reaction occurring within the last 10 years: Yes If all of the above answers are "NO", then may proceed with Cephalosporin use.   . Tape Rash    Patient Measurements: Height: 5\' 5"  (165.1 cm) Weight: 119 lb 14.9 oz (54.4 kg) IBW/kg (Calculated) : 57 Heparin Dosing Weight: 54.2 kg  Vital Signs: Temp: 98.2 F (36.8 C) (12/02 0846) Temp Source: Oral (12/02 0846) BP: 90/65 (12/02 0811) Pulse Rate: 91 (12/02 0811)  Labs: Recent Labs    01/19/17 0836 01/20/17 0854 01/21/17 0524  HGB 9.1* 11.0* 8.3*  HCT 27.7* 34.2* 26.4*  PLT 376 608* 337  LABPROT  --  14.1  --   INR  --  1.10  --   HEPARINUNFRC  --   --  0.64  CREATININE 2.13* 2.14* 1.98*    Medical History: Past Medical History:  Diagnosis Date  . Anxiety   . Arthritis    hips, legs, hand   . Barrett's esophagus   . Bone pain   . Bruises easily   . Cancer ([place])    skin cancer that has been removed- L hand  . CHF (congestive heart failure) ([place])   . Chills   . CVA (cerebral infarction)    [place] Strokes   . Depression   . Disturbed concentration   . Diverticulitis of large intestine with perforation Hx colectomy  . Esophageal stricture   . Fatigue   . Forgetfulness   . Gastroparesis   . Generalized headaches   . GERD (gastroesophageal reflux disease)   . Heart failure, diastolic, chronic ([place])   . History of hiatal hernia   . Hx of cardiovascular stress test    Lexiscan Myoview (06/2013):  No ischemia, EF 72%; Low Risk  . Hyperlipemia   .  Hypertension   . Irritable   . Itch    skin  . Keratosis   . Macular degeneration    left eye  . Nausea    little vomitting  . Osteoporosis   . Pancreatic cyst   . Pancreatic mass   . Panic attacks   . Persistent headaches   . Pulmonary hypertension ([place])   . Rapid heart rate    some times  . Renal failure    transplant - 1983,  followed by Dr. Justin Mend   . Sleep apnea    uses at bedtime- 2liters/min , negative- apnea., last study- 2/017  . Sleep difficulties   . Stroke ([place])   . TIA (transient ischemic attack)      Assessment: Pt is on Eliquis pta for AFib. Last dose was on 11/13. All anticoagulation has been held during admission due to melena (now resolved). The pt had an EGD which found gastritis as likely cause of melena, no overt bleeding. Remains on ppi bid. She received 1 unit of prbc on 11/27. Heparin infusion was resumed yesterday. Her heparin level this morning was therapeutic. Hgb back down to 8.3, from 11 yesterday. Planning to repeat CBC this afternoon.   Goal of Therapy:  Heparin level 0.3-0.7 units/ml Monitor platelets by anticoagulation protocol: Yes  Plan:  Continue heparin at 1100 units/hr Daily HL, CBC   Morgan Cooper, Morgan Cooper 01/21/2017,9:28 AM

## 2017-01-21 NOTE — Progress Notes (Signed)
71 y.o.year-old with hx of ESRD sp renal transplant (LRD, 1984, pred/ imuran) Presented 11/14 w/SOB/ DOE and some mild CP. Recent new AF on BB and Eliquis. Hx of dCHF, not on diuretics PTA. Hypoxic on adm. BNP was 2000, initial Rx IV diltiazem for afib /RVR,not felt to be vol overloaded. CT angio w/ contrast no PE but RUL air space disease, pulm edema vs infection. Lactic acid 1.45, WBC 9k. Admitted, rx'd with IV diltiazem, po metoprolol, IV abx (rocephin, vanc/ zosyn) for PNA. Intubated 11/15. Admit creat 0.8, 2.2 on 11/16, 3.14 11/18 w/oliguria. Weight up 50->53 kg. CXR diffuse bilat infiltrates, R>L . Hypotension on levo gtt for about 24 hrs.BP's in low 100 range. ECHO showed normal LVEF. Asked to see for AKI.   Impression: 1. AKI -creatinineslow improvement 2. Renal transplantwith dysfunction- Creat normal 0.8. Peak creatinine 4.3.Imuran and prednisone. Dr Justin Mend at [facility]. 3. Hypernatremia, resolved 4. VDRF - s/p emergency tracheostomy after extubation 11/30 cx by mediastinal air 5. Multifocal PNA / hx pulm fibrosis and COPD  6. AF with RVR,recurrent 7. dCHF, getting diuresed and I think she has volume on board still 8. Anemia -transfused, Endo donew/ diffuse gastritis ? Worse azotemia due to recurrent GIB 9.   Diarrhea -improved  Subjective: Interval History: Drop in Hgb on hep, now on lod  Objective: Vital signs in last 24 hours: Temp:  [97.9 F (36.6 C)-99 F (37.2 C)] 98.6 F (37 C) (12/02 1208) Pulse Rate:  [41-115] 103 (12/02 1116) Resp:  [11-28] 14 (12/02 1116) BP: (56-137)/(39-96) 91/59 (12/02 1116) SpO2:  [98 %-100 %] 99 % (12/02 1116) FiO2 (%):  [50 %] 50 % (12/02 1116) Weight:  [54.4 kg (119 lb 14.9 oz)] 54.4 kg (119 lb 14.9 oz) (12/02 0400) Weight change: 0.2 kg (7.1 oz)  Intake/Output from previous day: 12/01 0701 - 12/02 0700 In: 1063.5 [I.V.:632.3; NG/GT:331.3; IV Piggyback:100] Out: 1287 [Urine:4355] Intake/Output this shift: No intake/output  data recorded.  Head: Normocephalic, without obvious abnormality, atraumatic Throat: trach Chest wall: no tenderness Extremities: edema trace to 1+ at knees  awake  Lab Results: Recent Labs    01/20/17 0854 01/21/17 0524  WBC 19.8* 15.3*  HGB 11.0* 8.3*  HCT 34.2* 26.4*  PLT 608* 337   BMET:  Recent Labs    01/20/17 0854 01/21/17 0524  NA 142 141  K 4.6 3.4*  CL 104 105  CO2 24 26  GLUCOSE 151* 155*  BUN 76* 84*  CREATININE 2.14* 1.98*  CALCIUM 9.2 8.2*   No results for input(s): PTH in the last 72 hours. Iron Studies: No results for input(s): IRON, TIBC, TRANSFERRIN, FERRITIN in the last 72 hours. Studies/Results: Ct Chest Wo Contrast  Addendum Date: 01/19/2017   ADDENDUM REPORT: 01/19/2017 22:52 ADDENDUM: Findings called to Dr. Charisse Klinefelter. Electronically Signed   By: Dorise Bullion III M.D   On: 01/19/2017 22:52   Result Date: 01/19/2017 CLINICAL DATA:  Evaluate tracheostomy tube. EXAM: CT CHEST WITHOUT CONTRAST TECHNIQUE: Multidetector CT imaging of the chest was performed following the standard protocol without IV contrast. COMPARISON:  CT scan January 03, 2017. Chest x-ray from earlier today. CT the abdomen January 08, 2017. FINDINGS: Cardiovascular: Cardiomegaly is identified with coronary artery calcifications. There is a small left pleural effusion which is larger in the interval. A small right pleural effusion is similar in the interval. No pericardial effusion is noted. The thoracic aorta is unchanged with atherosclerotic change. The main pulmonary artery measures 3.8 cm which is stable and enlarged. Mediastinum/Nodes:  A tracheostomy tube is identified, entering the trachea anteriorly just below the thyroid. The tracheostomy tube terminates in the trachea with no narrowing of the airway. There is significant mediastinal air adjacent to the trachea and esophagus extending to the level the left mainstem bronchus. There is also extension into the neck extending to the  level of the thyroid. A mildly prominent precarinal node may be reactive. No other adenopathy. Lungs/Pleura: Bilateral pleural effusions. Bilateral pulmonary infiltrates. The infiltrate in the right upper lobe has improved. Infiltrate in the right middle and lower lobes has worsened. There is also mild infiltrate in the left upper lobe which is new. Emphysematous changes are identified. A bulla is seen in the left base, unchanged. Upper Abdomen: The left kidney is atrophic and poorly evaluated. Postsurgical changes are seen in the upper abdomen. There is a tubular region of mixed attenuation posterior to the stomach, in the region of the pancreatic tail. The high attenuation suggests the possibility of blood products. The pancreatic tail has been resected. This finding has persisted since January 08, 2017. This abnormality measures up to 5.5 cm on series 4, image 137. There is a small amount of fluid adjacent to the spleen but the spleen appears to be grossly intact. Musculoskeletal: There is a healed sternal fracture proximally. There is discontinuity of the sternum more inferiorly as seen on sagittal image 64. There is motion in this region which could account for the finding. Recommend clinical correlation to exclude signs of fracture which is considered less likely given the motion. IMPRESSION: 1. Mediastinal air tracking along the trachea and esophagus. The tracheostomy tube is in place as above. 2. Infiltrates in the left upper lobe on bilateral lower lobes have worsened as well there has been improvement in the right upper lobe. Recommend follow-up to resolution. 3. Tubular mixed attenuation finding in the left upper quadrant is consistent with a fluid collection, probably containing blood products given the high attenuation. This finding has persisted since January 08, 2017 consistent with distal pancreatectomy. 4. Cardiomegaly.  Small bilateral effusions. 5. The main pulmonary artery is enlarged  suggesting pulmonary hypertension. 6. Discontinuity of the mid sternum. I suspect this is artifactual given motion in this location. Recommend clinical correlation. The findings will be called to the referring physician. Aortic Atherosclerosis (ICD10-I70.0) and Emphysema (ICD10-J43.9). Electronically Signed: By: Dorise Bullion III M.D On: 01/19/2017 18:37   Dg Chest Port 1 View  Result Date: 01/21/2017 CLINICAL DATA:  Acute respiratory failure EXAM: PORTABLE CHEST 1 VIEW COMPARISON:  01/20/2017 FINDINGS: Cardiomegaly. There is hyperinflation of the lungs compatible with COPD. Extensive diffuse bilateral airspace disease again noted, unchanged. Tracheostomy tube is unchanged. Interval placement of feeding tube with the tip in the mid stomach. IMPRESSION: Stable extensive bilateral airspace disease, unchanged. Cardiomegaly. Electronically Signed   By: Rolm Baptise M.D.   On: 01/21/2017 07:33   Dg Chest Port 1 View  Addendum Date: 01/20/2017   ADDENDUM REPORT: 01/20/2017 10:55 ADDENDUM: Reduction in the mediastinum gas identified within the thoracic inlet on comparison exam. No clear in pneumomediastinum identified. Electronically Signed   By: Suzy Bouchard M.D.   On: 01/20/2017 10:55   Result Date: 01/20/2017 CLINICAL DATA:  acute respiratory failure EXAM: PORTABLE CHEST 1 VIEW COMPARISON:  Chest radiograph 01/19/2017, CT 01/19/2017 FINDINGS: Tracheostomy tube in good position. Stable large cardiac silhouette. Dense LEFT basilar consolidation new with cystic change is similar to comparison CT and radiograph. Patchy airspace disease in the RIGHT lower lobe also unchanged. No pneumothorax.  IMPRESSION: 1. Dense bibasilar pneumonia unchanged. 2. Tracheostomy tube in good position. Electronically Signed: By: Suzy Bouchard M.D. On: 01/20/2017 08:25   Dg Chest Port 1 View  Result Date: 01/19/2017 CLINICAL DATA:  Tracheostomy EXAM: PORTABLE CHEST 1 VIEW COMPARISON:  01/19/2017 FINDINGS: Interval  placement of tracheostomy. Gas to the left of the tracheostomy may be within the trachea or mediastinum. No definite pneumothorax. Severe diffuse bilateral airspace disease is unchanged. IMPRESSION: Tracheostomy placement. Gas shadow to the left of the tracheostomy may be within the trachea or mediastinum. Attention on follow-up Severe bilateral airspace disease unchanged. Electronically Signed   By: Franchot Gallo M.D.   On: 01/19/2017 15:43    Scheduled: . atorvastatin  20 mg Per Tube Daily  . azaTHIOprine  50 mg Oral Daily  . chlorhexidine gluconate (MEDLINE KIT)  15 mL Mouth Rinse BID  . Chlorhexidine Gluconate Cloth  6 each Topical Daily  . cycloSPORINE  1 drop Both Eyes BID  . diltiazem  60 mg Per Tube Q6H  . feeding supplement (PRO-STAT SUGAR FREE 64)  30 mL Per Tube BID  . FLUoxetine  80 mg Per Tube Daily  . free water  300 mL Per Tube Q4H  . insulin aspart  0-15 Units Subcutaneous Q4H  . insulin glargine  10 Units Subcutaneous QHS  . mouth rinse  15 mL Mouth Rinse QID  . metoprolol tartrate  50 mg Per Tube BID  . pantoprazole sodium  40 mg Per Tube BID  . potassium chloride  40 mEq Per Tube BID  . predniSONE  7.5 mg Per Tube Q breakfast  . QUEtiapine  100 mg Per Tube QHS  . sodium chloride flush  10-40 mL Intracatheter Q12H  . sucralfate  1 g Per Tube BID    LOS: 18 days   Estanislado Emms 01/21/2017,2:29 PM

## 2017-01-21 NOTE — Progress Notes (Addendum)
PULMONARY / CRITICAL CARE MEDICINE   Name: Morgan Cooper MRN: [id number] DOB: 03/29/1945    ADMISSION DATE:01/03/2017  CONSULTATION DATE:01/04/17  REFERRING YB:[facility] Phillip Heal, NP / TRH  CHIEF COMPLAINT:Acute Hypoxic Respiratory Failure   Presenting HPI:  71 y.o. female with prior history of tobacco use. Presented to hospital with acute hypoxic respiratory failure and atrial fibrillation with rapid ventricular response. Concern for possible healthcare associated pneumonia. 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 Also history of COPD with emphysema & ILD presumably from [place]. Patient has a known history of atrial fibrillation, stroke, sleep apnea, macular degeneration, diastolic congestive heart failure, and gastroparesis.  Subjective:   Hypotensive overnight. Give fluid bolus and lopressor decreased  Review of Systems:  Unable to obtain given intubation.   Vent Mode: PSV;CPAP FiO2 (%):  [50 %] 50 % Set Rate:  [26 bmp] 26 bmp Vt Set:  [500 mL] 500 mL PEEP:  [5 cmH20-8 cmH20] 5 cmH20 Pressure Support:  [10 cmH20] 10 cmH20 Plateau Pressure:  [19 cmH20-25 cmH20] 19 cmH20  Temp:  [97.9 F (36.6 C)-99.6 F (37.6 C)] 98.2 F (36.8 C) (12/02 0341) Pulse Rate:  [41-118] 91 (12/02 0811) Resp:  [11-28] 11 (12/02 0811) BP: (56-146)/(39-96) 90/65 (12/02 0811) SpO2:  [98 %-100 %] 100 % (12/02 0811) FiO2 (%):  [50 %] 50 % (12/02 0811) Weight:  [119 lb 14.9 oz (54.4 kg)] 119 lb 14.9 oz (54.4 kg) (12/02 0400)  Gen:      No acute distress HEENT:  EOMI, sclera anicteric Neck:     No masses; no thyromegaly, trach site is clean, dry. No SQ air Lungs:    Clear to auscultation bilaterally; normal respiratory effort CV:         Regular rate and rhythm; no murmurs Abd:      + bowel sounds; soft, non-tender; no palpable masses, no distension Ext:    No edema; adequate peripheral perfusion Skin:      Warm and dry; no rash Neuro: Sedated,  responds to commands  LINES/TUBES: OETT 11/16 >>> 11/30 Trach 11/30>> R IJ CVL 11/16 >>> 11/29 Foley >>> replace 11/26 >> OGT >>> PIV  CBC Latest Ref Rng & Units 01/21/2017 01/20/2017 01/19/2017  WBC 4.0 - 10.5 K/uL 15.3(H) 19.8(H) 19.5(H)  Hemoglobin 12.0 - 15.0 g/dL 8.3(L) 11.0(L) 9.1(L)  Hematocrit 36.0 - 46.0 % 26.4(L) 34.2(L) 27.7(L)  Platelets 150 - 400 K/uL 337 608(H) 376   BMP Latest Ref Rng & Units 01/21/2017 01/20/2017 01/19/2017  Glucose 65 - 99 mg/dL 155(H) 151(H) 159(H)  BUN 6 - 20 mg/dL 84(H) 76(H) 85(H)  Creatinine 0.44 - 1.00 mg/dL 1.98(H) 2.14(H) 2.13(H)  Sodium 135 - 145 mmol/L 141 142 140  Potassium 3.5 - 5.1 mmol/L 3.4(L) 4.6 4.1  Chloride 101 - 111 mmol/L 105 104 104  CO2 22 - 32 mmol/L 26 24 25   Calcium 8.9 - 10.3 mg/dL 8.2(L) 9.2 8.5(L)   Hepatic Function Latest Ref Rng & Units 01/16/2017 01/15/2017 01/14/2017  Total Protein 6.5 - 8.1 g/dL - - -  Albumin 3.5 - 5.0 g/dL 2.2(L) 2.1(L) 1.9(L)  AST 15 - 41 U/L - - -  ALT 14 - 54 U/L - - -  Alk Phosphatase 38 - 126 U/L - - -  Total Bilirubin 0.3 - 1.2 mg/dL - - -  Bilirubin, Direct <=0.2 mg/dL - - -    IMAGING/STUDIES: CTA CHEST 11/14 > No PE, diffuse airspace disease, emphysema TTE 11/15:  LV normal in size w/  EF 55-60%. LA moderately dilated & RA moderately dilated. RV mildly dilated with preserved systolic function. Trivial aortic regurgitation. Trivial mitral regurgitation without stenosis. No pulmonic regurgitation. Mild tricuspid regurgitation. No pericardial effusion. CT chest 11/30> Air tracking along the trachea, esophagus.  Worsening interstitial opacities, lung fibrosis CXR 12/2 > no mediastinal air noted, trach is good position.  I have reviewed the images personally  MICROBIOLOGY: MRSA PCR 11/14:  Negative Blood Cultures x2 11/14:  Negative Tracheal Aspirate Culture 11/16:  Normal Respiratory Flora  Respiratory Panel PCR 11/15:  Negative  Influenza A/B PCR  11/14:  Negative  Urine  Streptococcal Antigen 11/14:  Negative  Urine Legionella Antigen 11/14:  Negative   ANTIBIOTICS: Azithromycin 11/14 - 11/15 Rocephin 11/14 - 11/15 Vancomycin 11/14 - 11/18 Zosyn 11/15 - 11/22 Vanco 12/1 > Zosyn 12/1 >  SIGNIFICANT EVENTS: 11/14 - Admit 11/16 - Transfer to ICU with worsening respiratory status 11/18 - Changed to pressure control ventilation  11/20 - Didn't tolerate attempt to switch back from Pressure Control to PRVC. Heparin drip stopped w/ anemia.  11/21 - Started Seroquel 32m BID.  11/23 - More calm and awake today. Tolerating PS 5/5 today w/ TV >800cc.  11/25 - Following commands today & more calm. Tolerating PS 5/5 again today. 11/26 - replacing foley, mild abdominal distention 11/28 - upper endoscopy with moderate gastritis  11/30- Failed extubation. Trach done  ASSESSMENT/PLAN:  71y.o.  female with chronic immunosuppression, renal transplant, ILD, afib. Admitted with acute resp failure presumed secondary to HCAP She has had a prolonged hospitalization.  Failed extubation and had a trach placed Noted to have mediastinal air after intubation and trach placement.  Discussed with radiologist and we are unable to do a esophagram in a patient who cannot swallow. There is low suspicion for esophageal injury so we will continue to follow clinically and get repeat CT in 1-2 days  PULMONARY A: Acute on chronic respiratory failure HCAP, aspiration pneumonia Status post trach Mediastinal air > appears resolved on CXR  P:   Follow up CXR. Repeat CT chest in 1-2 days Start vanco, zosyn to treat aspiration Follow cultures, Pct  CARDIOVASCULAR A:  Atrial fibrillation Pulmonary HTN Acute on chronic diastolic congestive heart failure P:  Rate control with cardizem and lopressor. Lopressor dose reduced due to hypotension Restarted heparin gtt Hold lasix today as BP is soft  RENAL A:   Renal failure s/p kidney transplant P:   Follow urine output and  Cr Continue imuran and steroids  GASTROINTESTINAL A:   Melena, GIB Has gastritis on EGD P:   Continue PPI bid. Add sucralfate Hb is lower today. Recheck CBC in afternoon. May need repeat scope by GI if Hb continues to drop.  HEMATOLOGIC A:   Leukocytosis Anemia P:  Follow CBC  INFECTIOUS A:   HCAP, aspiration PNA P:   Continue vanco, zosyn Pct is low. Low threshold to stop antibiotics  ENDOCRINE A:   Adrenal insufficieny, on chronic steroids  P:   Continue prednisone Lantus, SSI coverage  NEUROLOGIC A:   Anxiety, encephalopahty P:   Fentanyl gtt, vesed PRN Continue seroquel, prozac  FAMILY  - Updates: Family updated - Inter-disciplinary family meet or Palliative Care meeting due by:    The patient is critically ill with multiple organ system failure and requires high complexity decision making for assessment and support, frequent evaluation and titration of therapies, advanced monitoring, review of radiographic studies and interpretation of complex data.   Critical Care Time devoted  to patient care services, exclusive of separately billable procedures, described in this note is 35 minutes.   Marshell Garfinkel MD [facility] Pager [phone] If no answer or after 3pm call: [phone] 01/21/2017, 8:14 AM

## 2017-01-21 NOTE — Progress Notes (Signed)
[facility] Progress Note Patient Name: Morgan Cooper DOB: 1945/12/01 MRN: [id number]   Date of Service  01/21/2017  HPI/Events of Note  Hypotension - BP = 56/39 s/p Metoprolol and Seroquel PO. Last LVEF  = 55% to 60%. HR = 65.   eICU Interventions  Will order:  1. Bolus with 0.9 NaCl 1 liter IV over 1 hour now.  2. Decrease the Metoprolol to 50 mg PO Q 12 hours.      Intervention Category Major Interventions: Hypotension - evaluation and management  Sommer,Steven Eugene 01/21/2017, 12:12 AM

## 2017-01-22 ENCOUNTER — Inpatient Hospital Stay (HOSPITAL_COMMUNITY): Payer: Medicare Other

## 2017-01-22 DIAGNOSIS — J189 Pneumonia, unspecified organism: Secondary | ICD-10-CM

## 2017-01-22 LAB — BASIC METABOLIC PANEL
ANION GAP: 7 (ref 5–15)
BUN: 74 mg/dL — AB (ref 6–20)
CHLORIDE: 108 mmol/L (ref 101–111)
CO2: 26 mmol/L (ref 22–32)
Calcium: 7.9 mg/dL — ABNORMAL LOW (ref 8.9–10.3)
Creatinine, Ser: 1.7 mg/dL — ABNORMAL HIGH (ref 0.44–1.00)
GFR calc Af Amer: 34 mL/min — ABNORMAL LOW (ref >60)
GFR, EST NON AFRICAN AMERICAN: 29 mL/min — AB (ref >60)
GLUCOSE: 144 mg/dL — AB (ref 65–99)
POTASSIUM: 4.3 mmol/L (ref 3.5–5.1)
Sodium: 141 mmol/L (ref 135–145)

## 2017-01-22 LAB — CBC
HEMATOCRIT: 25.2 % — AB (ref 36.0–46.0)
HEMOGLOBIN: 8.1 g/dL — AB (ref 12.0–15.0)
MCH: 31 pg (ref 26.0–34.0)
MCHC: 32.1 g/dL (ref 30.0–36.0)
MCV: 96.6 fL (ref 78.0–100.0)
PLATELETS: 248 10*3/uL (ref 150–400)
RBC: 2.61 MIL/uL — AB (ref 3.87–5.11)
RDW: 15.6 % — ABNORMAL HIGH (ref 11.5–15.5)
WBC: 10.2 10*3/uL (ref 4.0–10.5)

## 2017-01-22 LAB — MAGNESIUM: Magnesium: 2.3 mg/dL (ref 1.7–2.4)

## 2017-01-22 LAB — GLUCOSE, CAPILLARY
GLUCOSE-CAPILLARY: 132 mg/dL — AB (ref 65–99)
Glucose-Capillary: 136 mg/dL — ABNORMAL HIGH (ref 65–99)
Glucose-Capillary: 161 mg/dL — ABNORMAL HIGH (ref 65–99)
Glucose-Capillary: 110 mg/dL — ABNORMAL HIGH (ref 65–99)
Glucose-Capillary: 141 mg/dL — ABNORMAL HIGH (ref 65–99)

## 2017-01-22 LAB — URINE CULTURE: Culture: >=100000 — AB

## 2017-01-22 LAB — HEPARIN LEVEL (UNFRACTIONATED): Heparin Unfractionated: 0.64 IU/mL (ref 0.30–0.70)

## 2017-01-22 LAB — PHOSPHORUS: Phosphorus: 3.7 mg/dL (ref 2.5–4.6)

## 2017-01-22 MED ORDER — SODIUM CHLORIDE 0.9 % IV BOLUS (SEPSIS)
1000.0000 mL | Freq: Once | INTRAVENOUS | Status: AC
  Administered 2017-01-22: 1000 mL via INTRAVENOUS

## 2017-01-22 MED ORDER — SODIUM CHLORIDE 0.9 % IV SOLN
1.0000 g | Freq: Two times a day (BID) | INTRAVENOUS | Status: DC
  Administered 2017-01-22 – 2017-01-24 (×5): 1 g via INTRAVENOUS
  Filled 2017-01-22 (×7): qty 1

## 2017-01-22 MED ORDER — CHLORHEXIDINE GLUCONATE 0.12 % MT SOLN
15.0000 mL | Freq: Two times a day (BID) | OROMUCOSAL | Status: DC
  Administered 2017-01-22 – 2017-01-23 (×3): 15 mL via OROMUCOSAL

## 2017-01-22 MED ORDER — DILTIAZEM 12 MG/ML ORAL SUSPENSION
30.0000 mg | Freq: Four times a day (QID) | ORAL | Status: DC
  Administered 2017-01-22 – 2017-01-24 (×11): 30 mg
  Filled 2017-01-22 (×13): qty 3

## 2017-01-22 MED ORDER — PIPERACILLIN-TAZOBACTAM 3.375 G IVPB
3.3750 g | Freq: Three times a day (TID) | INTRAVENOUS | Status: DC
  Filled 2017-01-22: qty 50

## 2017-01-22 MED ORDER — VANCOMYCIN HCL 500 MG IV SOLR
500.0000 mg | INTRAVENOUS | Status: DC
  Administered 2017-01-22: 500 mg via INTRAVENOUS
  Filled 2017-01-22: qty 500

## 2017-01-22 MED ORDER — ORAL CARE MOUTH RINSE
15.0000 mL | Freq: Two times a day (BID) | OROMUCOSAL | Status: DC
  Administered 2017-01-22 – 2017-01-24 (×6): 15 mL via OROMUCOSAL

## 2017-01-22 NOTE — Progress Notes (Signed)
Per RN request, patient's daughter needs temporary housing while patient is in the hospital. CSW made referral to [employer] service for [employer].  Morgan Cooper LCSWA [phone]

## 2017-01-22 NOTE — Procedures (Deleted)
Bronchoscopy Procedure Note Morgan Cooper [id number] Feb 18, 1946  Procedure: Bronchoscopy Indications: Diagnostic evaluation of the airways  Procedure Details Consent: Risks of procedure as well as the alternatives and risks of each were explained to the (patient/caregiver).  Consent for procedure obtained. Time Out: Verified patient identification, verified procedure, site/side was marked, verified correct patient position, special equipment/implants available, medications/allergies/relevent history reviewed, required imaging and test results available.  Performed  In preparation for procedure, patient was given 100% FiO2 and bronchoscope lubricated. Sedation: Benzodiazepines, Muscle relaxants, Etomidate and Fentanyl  Airway entered and the following bronchi were examined: RUL, RML, RLL, LUL, LLL and Bronchi.   Bronchoscope removed.  , Patient placed back on 100% FiO2 at conclusion of procedure.    Procedure was done as a part of tracheostomy placement.  Evaluation Hemodynamic Status: BP stable throughout; O2 sats: stable throughout Patient's Current Condition: stable Specimens:  None Complications: No apparent complications Patient did tolerate procedure well.   Jennet Maduro 01/22/2017

## 2017-01-22 NOTE — Progress Notes (Signed)
Chart reviewed including events that have occurred over the past several weeks. Remains in a-fib with RVR - rate primarily driven by VDRF, anemia and hypotension. Agree with diltiazem and lopressor for rate control. Will be available as needed.  Pixie Casino, MD, [facility], [place] Director of the [facility] Lip[facility] &  Card[facility] Attending Cardiologist  Direct Dial: [phone]  Fax: [phone]  Website:  [email or web address]

## 2017-01-22 NOTE — Progress Notes (Signed)
PULMONARY / CRITICAL CARE MEDICINE   Name: Morgan Cooper MRN: [id number] DOB: [birth year]    ADMISSION DATE:01/03/2017  CONSULTATION DATE:01/04/17  REFERRING PY:KDXIP Phillip Heal, NP / TRH  CHIEF COMPLAINT:Acute Hypoxic Respiratory Failure   Presenting HPI:  71 y.o. female with prior history of tobacco use. Presented to hospital with acute hypoxic respiratory failure and atrial fibrillation with rapid ventricular response. Concern for possible healthcare associated pneumonia. 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 Also history of COPD with emphysema & ILD presumably from [place]. Patient has a known history of atrial fibrillation, stroke, sleep apnea, macular degeneration, diastolic congestive heart failure, and gastroparesis.  Subjective:  RN reports BP improved, pt up to chair.  Weaning on PSV.  Mild bleeding from trach site > RN thinks it is from periods where the patient will thrash head side to side    Vent Mode: PSV;CPAP FiO2 (%):  [40 %-50 %] 40 % Set Rate:  [26 bmp] 26 bmp Vt Set:  [460 mL] 460 mL PEEP:  [8 cmH20] 8 cmH20 Pressure Support:  [10 cmH20] 10 cmH20 Plateau Pressure:  [17 cmH20-23 cmH20] 17 cmH20  Temp:  [97.5 F (36.4 C)-98.9 F (37.2 C)] 97.5 F (36.4 C) (12/03 0815) Pulse Rate:  [73-118] 117 (12/03 0810) Resp:  [11-32] 26 (12/03 0810) BP: (71-148)/(53-112) 125/87 (12/03 0810) SpO2:  [94 %-100 %] 99 % (12/03 1015) FiO2 (%):  [40 %-50 %] 40 % (12/03 1015) Weight:  [122 lb 5.7 oz (55.5 kg)] 122 lb 5.7 oz (55.5 kg) (12/03 0400)  General: frail elderly female in NAD, sitting up in chair  HEENT: MM pink/moist, trach midline with bloody drainage at site, small bore feeding tube Neuro: calm/intermittently drowsy, MAE, generalized weakness  CV: s1s2 rrr, no m/r/g PULM: even/non-labored, lungs bilaterally clear anterior, diminished lower  GI: soft, non-tender, bsx4 active  Extremities: warm/dry, no edema  Skin:  no rashes or lesions   CBC Latest Ref Rng & Units 01/22/2017 01/21/2017 01/21/2017  WBC 4.0 - 10.5 K/uL 10.2 13.7(H) 15.3(H)  Hemoglobin 12.0 - 15.0 g/dL 8.1(L) 9.0(L) 8.3(L)  Hematocrit 36.0 - 46.0 % 25.2(L) 28.3(L) 26.4(L)  Platelets 150 - 400 K/uL 248 321 337   BMP Latest Ref Rng & Units 01/22/2017 01/21/2017 01/20/2017  Glucose 65 - 99 mg/dL 144(H) 155(H) 151(H)  BUN 6 - 20 mg/dL 74(H) 84(H) 76(H)  Creatinine 0.44 - 1.00 mg/dL 1.70(H) 1.98(H) 2.14(H)  Sodium 135 - 145 mmol/L 141 141 142  Potassium 3.5 - 5.1 mmol/L 4.3 3.4(L) 4.6  Chloride 101 - 111 mmol/L 108 105 104  CO2 22 - 32 mmol/L 26 26 24   Calcium 8.9 - 10.3 mg/dL 7.9(L) 8.2(L) 9.2   Hepatic Function Latest Ref Rng & Units 01/16/2017 01/15/2017 01/14/2017  Total Protein 6.5 - 8.1 g/dL - - -  Albumin 3.5 - 5.0 g/dL 2.2(L) 2.1(L) 1.9(L)  AST 15 - 41 U/L - - -  ALT 14 - 54 U/L - - -  Alk Phosphatase 38 - 126 U/L - - -  Total Bilirubin 0.3 - 1.2 mg/dL - - -  Bilirubin, Direct <=0.2 mg/dL - - -    IMAGING/STUDIES: CTA CHEST 11/14 > No PE, diffuse airspace disease, emphysema TTE 11/15:  LV normal in size w/ EF 55-60%. LA moderately dilated & RA moderately dilated. RV mildly dilated with preserved systolic function. Trivial aortic regurgitation. Trivial mitral regurgitation without stenosis. No pulmonic regurgitation. Mild tricuspid regurgitation. No pericardial effusion. CT chest 11/30> Air tracking along the  trachea, esophagus.  Worsening interstitial opacities, lung fibrosis CXR 12/2 > no mediastinal air noted, trach is good position.  I have reviewed the images personally  MICROBIOLOGY: MRSA PCR 11/14:  Negative Blood Cultures x2 11/14:  Negative Tracheal Aspirate Culture 11/16:  Normal Respiratory Flora  Respiratory Panel PCR 11/15:  Negative  Influenza A/B PCR  11/14:  Negative  Urine Streptococcal Antigen 11/14:  Negative  Urine Legionella Antigen 11/14:  Negative  UC 12/1 >> 100k pseudomonas aeruginosa >>  S-imipenem, zosyn (but MIC 32) BC 12/1 >>   ANTIBIOTICS: Azithromycin 11/14 - 11/15 Rocephin 11/14 - 11/15 Vancomycin 11/14 - 11/18 Zosyn 11/15 - 11/22 Vanco 12/1 >> Zosyn 12/1 >> 12/3 Imipenem 12/3 >>   LINES/TUBES: OETT 11/16 >> 11/30 Trach 11/30 >> R IJ CVL 11/16 >> 11/29 Foley >> replace 11/26 >> OGT >>  SIGNIFICANT EVENTS: 11/14 - Admit 11/16 - Transfer to ICU with worsening respiratory status 11/18 - Changed to pressure control ventilation  11/20 - Didn't tolerate attempt to switch back from Pressure Control to PRVC. Heparin drip stopped w/ anemia.  11/21 - Started Seroquel 81m BID.  11/23 - More calm and awake today. Tolerating PS 5/5 today w/ TV >800cc.  11/25 - Following commands today & more calm. Tolerating PS 5/5 again today. 11/26 - replacing foley, mild abdominal distention 11/28 - upper endoscopy with moderate gastritis  11/30 - Failed extubation. Trach done 12/03 - Hypotensive overnight. Give fluid bolus and lopressor decreased  ASSESSMENT/PLAN:  71y.o.  female with chronic immunosuppression, renal transplant, ILD, afib. Admitted with acute resp failure presumed secondary to HCAP.  She has had a prolonged hospitalization.  Failed extubation and had a trach placed Noted to have mediastinal air after intubation and trach placement.  Discussed with radiologist and we are unable to do a esophagram in a patient who cannot swallow. There is low suspicion for esophageal injury so we will continue to follow clinically and get repeat CT 12/4   PULMONARY A: Acute on chronic respiratory failure HCAP, Aspiration pneumonia - concern for aspiration 11/30 with repeat intubation Status post trach Mediastinal air - appears resolved on CXR Tracheal Site Bleeding  P:   PCV as rest mode PSV wean as tolerated  Follow CXR  Repeat CT chest 12/3 pm  ABX as above  CARDIOVASCULAR A:  Atrial fibrillation Pulmonary HTN Acute on chronic diastolic congestive heart  failure P:  Rate control with cardizem + lopressor  Lopressor reduced 12/1 due to hypotension Continue heparin gtt for now, monitor with trach site bleeding  Hold lasix  Continue lipitor   RENAL A:   Renal failure s/p kidney transplant P:   Continue home imuran + steroids  Continue 300 ml free water Q4  Trend BMP / urinary output Replace electrolytes as indicated Avoid nephrotoxic agents, ensure adequate renal perfusion  GASTROINTESTINAL A:   Melena, GIB Gastritis on EGD P:   PPI BID  Continue sucralfate  Trend CBC May need repeat EGD if hgb continues to drop  HEMATOLOGIC A:   Leukocytosis Anemia P:  Trend CBC  Monitor for bleeding  Heparin gtt per Pharmacy, appreciate assistance  INFECTIOUS A:   HCAP, aspiration PNA Pseudomonal UTI - will treat as active infection given immune suppression & 100K growth  P:   Change to meropenem given MIC for zosyn  Trend PCT  Discontinue Vanco   ENDOCRINE A:   Adrenal insufficieny, on chronic steroids  P:   Continue prednisone  Lantus, SSI coverage  NEUROLOGIC  A:   Anxiety, encephalopahty P:   Fentanyl gtt, vesed PRN Continue seroquel, prozac  FAMILY  - Updates: Daughter updated at bedside 12/3 am on NP rounding.    - Global:  Plan for LTAC soon.  Monitor trach site bleeding, repeat CT chest needed before ok to transfer.    CC Time:  49 minutes   Noe Gens, NP-C [facility] Pgr: [phone] or if no answer [phone] 01/22/2017, 11:00 AM  Attending Note:  71 year old female with history of COPD who presents to PCCM with a-fib, pulmonary edema, respiratory failure and concern for HCAP.  Patient was intubated for respiratory failure and failure to protect her airway.  Was extubated on Friday and promptly failed.  She is extremely deconditioned on exam with decreased BS diffusely.  I reviewed CXR myself, trach is in good position.  Will proceed with weaning today.  Trach site bleeding on  heparin, will hold heparin for now and monitor for bleeding.  Will need to decide PEG placement.  PS 10/5.  Hold off TC for now.  PCCM will continue to follow.  The patient is critically ill with multiple organ systems failure and requires high complexity decision making for assessment and support, frequent evaluation and titration of therapies, application of advanced monitoring technologies and extensive interpretation of multiple databases.   Critical Care Time devoted to patient care services described in this note is  35  Minutes. This time reflects time of care of this signee Dr Jennet Maduro. This critical care time does not reflect procedure time, or teaching time or supervisory time of PA/NP/Med student/Med Resident etc but could involve care discussion time.  Rush Farmer, M.D. [facility] Pulmonary/Critical Care Medicine. Pager: [phone]. After hours pager: [phone].

## 2017-01-22 NOTE — Progress Notes (Signed)
175 mcg fentanyl wasted with Jabier Gauss RN

## 2017-01-22 NOTE — Progress Notes (Addendum)
[facility] for heparin Indication: atrial fibrillation  Allergies  Allergen Reactions  . Amoxicillin Diarrhea    Has patient had a PCN reaction causing immediate rash, facial/tongue/throat swelling, SOB or lightheadedness with hypotension: No Has patient had a PCN reaction causing severe rash involving mucus membranes or skin necrosis: No Has patient had a PCN reaction that required hospitalization No Has patient had a PCN reaction occurring within the last 10 years: Yes If all of the above answers are "NO", then may proceed with Cephalosporin use.   . Tape Rash    Patient Measurements: Height: 5\' 5"  (165.1 cm) Weight: 122 lb 5.7 oz (55.5 kg) IBW/kg (Calculated) : 57 Heparin Dosing Weight: 54.2 kg  Vital Signs: Temp: 97.5 F (36.4 C) (12/03 0815) Temp Source: Oral (12/03 0815) BP: 125/87 (12/03 0810) Pulse Rate: 117 (12/03 0810)  Labs: Recent Labs    01/20/17 0854 01/21/17 0524 01/21/17 1704 01/22/17 0344  HGB 11.0* 8.3* 9.0* 8.1*  HCT 34.2* 26.4* 28.3* 25.2*  PLT 608* 337 321 248  LABPROT 14.1  --   --   --   INR 1.10  --   --   --   HEPARINUNFRC  --  0.64  --  0.64  CREATININE 2.14* 1.98*  --  1.70*    Medical History: Past Medical History:  Diagnosis Date  . Anxiety   . Arthritis    hips, legs, hand   . Barrett's esophagus   . Bone pain   . Bruises easily   . Cancer ([place])    skin cancer that has been removed- L hand  . CHF (congestive heart failure) ([place])   . Chills   . CVA (cerebral infarction)    Mini Strokes   . Depression   . Disturbed concentration   . Diverticulitis of large intestine with perforation Hx colectomy  . Esophageal stricture   . Fatigue   . Forgetfulness   . Gastroparesis   . Generalized headaches   . GERD (gastroesophageal reflux disease)   . Heart failure, diastolic, chronic ([place])   . History of hiatal hernia   . Hx of cardiovascular stress test    Lexiscan Myoview (06/2013):  No  ischemia, EF 72%; Low Risk  . Hyperlipemia   . Hypertension   . Irritable   . Itch    skin  . Keratosis   . Macular degeneration    left eye  . Nausea    little vomitting  . Osteoporosis   . Pancreatic cyst   . Pancreatic mass   . Panic attacks   . Persistent headaches   . Pulmonary hypertension ([place])   . Rapid heart rate    some times  . Renal failure    transplant - 1983,  followed by Dr. Justin Mend   . Sleep apnea    uses at bedtime- 2liters/min , negative- apnea., last study- 2/017  . Sleep difficulties   . Stroke ([place])   . TIA (transient ischemic attack)      Assessment: Pt is on Eliquis pta for AFib. Last dose was on 11/13. All anticoagulation has been held during admission due to melena (now resolved). The pt had an EGD which found gastritis as likely cause of melena, no overt bleeding. Remains on ppi bid. She received 1 unit of prbc on 11/27. Her heparin level this morning was therapeutic on 1100 units/hr. Hgb back down to 8.   Goal of Therapy:  Heparin level 0.3-0.7 units/ml Monitor platelets  by anticoagulation protocol: Yes    Plan:  Continue heparin at 1100 units/hr Daily HL, CBC   Albertina Parr, PharmD., BCPS Clinical Pharmacist Pager [phone]  Addendum: Patient's urine cx is now positive for pseudomonas intermediate to cefepime and S to Zosyn (MIC 32). Currently being treated with Zosyn and vancomycin for aspiration pneumonia. Given slow progress and h/o immunosuppression, will consider treating UTI as well. CrCl improved to ~ 25 mL/min. WBC down to 10.2. Hypothermic this AM.   Plan: Stop Zosyn and start meropenem 500 mg IV Q 12 hours. Monitor clinical progress.  Increase vancomycin to 1gm IV Q 24 hours.  VT as needed  Albertina Parr, PharmD., BCPS Clinical Pharmacist Pager [phone]

## 2017-01-22 NOTE — Progress Notes (Signed)
71 y.o.year-old with hx of ESRD sp renal transplant (LRD, 1984, pred/ imuran) Presented 11/14 w/SOB/ DOE and CP. Recent new AF on BB and Eliquis. Hx of dCHF. Hypoxic on adm. BNP was 2000, initial Rx IV diltiazem for afib /RVR,not felt to be vol overloaded. CT angio w/ contrast no PE but RUL air space disease, pulm edema vs infection. Lactic acid 1.45, WBC 9k. Admitted, rx'd with IV diltiazem, po metoprolol, IV abx (rocephin, vanc/ zosyn) for PNA. Intubated 11/15. Admit creat 0.8, AKI due to above- now trending down- no dialytic intervention required    Impression: 1. AKI -creatinineslow improvement- 1.7 today  2. Renal transplantwith dysfunction- Creat normal 0.8. Peak creatinine 4.3.Imuran and prednisone. Dr Justin Mend at [facility]. On home doses 3. Hypernatremia, resolved 4. VDRF - s/p emergency tracheostomy after extubation 11/30 cx by mediastinal air 5. Multifocal PNA / hx pulm fibrosis and COPD - slow recovery - zosyn 6. AF with RVR,recurrent- now rate in the low 100's - dilt and metoprolol 7. dCHF, good UOP- given a liter of NS overnight for hypotension- no diuretics for now 8. Anemia -transfused, Endo donew/ diffuse gastritis ? Worse azotemia due to recurrent GIB- lower today - also on heparin ?? 9.   Diarrhea -improved 10 Hypokalemia- resolved after repletion   Subjective: Interval History: Did have some more hypotension overnight- bolused and diltiazem decreased - 2455 UOP   Objective: Vital signs in last 24 hours: Temp:  [97.6 F (36.4 C)-98.9 F (37.2 C)] 97.6 F (36.4 C) (12/03 0340) Pulse Rate:  [73-118] 115 (12/03 0600) Resp:  [11-32] 16 (12/03 0600) BP: (71-148)/(53-112) 120/81 (12/03 0600) SpO2:  [94 %-100 %] 100 % (12/03 0600) FiO2 (%):  [40 %-50 %] 40 % (12/03 0401) Weight:  [55.5 kg (122 lb 5.7 oz)] 55.5 kg (122 lb 5.7 oz) (12/03 0400) Weight change: 1.1 kg (2 lb 6.8 oz)  Intake/Output from previous day: 12/02 0701 - 12/03 0700 In: 4368 [I.V.:463; NG/GT:2705;  IV Piggyback:1200] Out: 2655 [Urine:2455; Stool:200] Intake/Output this shift: No intake/output data recorded.  Head: Normocephalic, without obvious abnormality, atraumatic Throat: trach Chest wall: no tenderness Extremities: edema trace to 1+ at knees  awake  Lab Results: Recent Labs    01/21/17 1704 01/22/17 0344  WBC 13.7* 10.2  HGB 9.0* 8.1*  HCT 28.3* 25.2*  PLT 321 248   BMET:  Recent Labs    01/21/17 0524 01/22/17 0344  NA 141 141  K 3.4* 4.3  CL 105 108  CO2 26 26  GLUCOSE 155* 144*  BUN 84* 74*  CREATININE 1.98* 1.70*  CALCIUM 8.2* 7.9*   No results for input(s): PTH in the last 72 hours. Iron Studies: No results for input(s): IRON, TIBC, TRANSFERRIN, FERRITIN in the last 72 hours. Studies/Results: Dg Chest Port 1 View  Result Date: 01/22/2017 CLINICAL DATA:  Acute respiratory failure. EXAM: PORTABLE CHEST 1 VIEW COMPARISON:  01/21/2017. FINDINGS: Tracheostomy tube and feeding tube remain in good position. Cardiomegaly is stable. BILATERAL pulmonary opacities are unchanged. There is no pneumothorax. IMPRESSION: Stable chest. Electronically Signed   By: Staci Righter M.D.   On: 01/22/2017 07:08   Dg Chest Port 1 View  Result Date: 01/21/2017 CLINICAL DATA:  Acute respiratory failure EXAM: PORTABLE CHEST 1 VIEW COMPARISON:  01/20/2017 FINDINGS: Cardiomegaly. There is hyperinflation of the lungs compatible with COPD. Extensive diffuse bilateral airspace disease again noted, unchanged. Tracheostomy tube is unchanged. Interval placement of feeding tube with the tip in the mid stomach. IMPRESSION: Stable extensive bilateral airspace disease,  unchanged. Cardiomegaly. Electronically Signed   By: Rolm Baptise M.D.   On: 01/21/2017 07:33   Dg Chest Port 1 View  Addendum Date: 01/20/2017   ADDENDUM REPORT: 01/20/2017 10:55 ADDENDUM: Reduction in the mediastinum gas identified within the thoracic inlet on comparison exam. No clear in pneumomediastinum identified.  Electronically Signed   By: Suzy Bouchard M.D.   On: 01/20/2017 10:55   Result Date: 01/20/2017 CLINICAL DATA:  acute respiratory failure EXAM: PORTABLE CHEST 1 VIEW COMPARISON:  Chest radiograph 01/19/2017, CT 01/19/2017 FINDINGS: Tracheostomy tube in good position. Stable large cardiac silhouette. Dense LEFT basilar consolidation new with cystic change is similar to comparison CT and radiograph. Patchy airspace disease in the RIGHT lower lobe also unchanged. No pneumothorax. IMPRESSION: 1. Dense bibasilar pneumonia unchanged. 2. Tracheostomy tube in good position. Electronically Signed: By: Suzy Bouchard M.D. On: 01/20/2017 08:25    Scheduled: . atorvastatin  20 mg Per Tube Daily  . azaTHIOprine  50 mg Oral Daily  . chlorhexidine gluconate (MEDLINE KIT)  15 mL Mouth Rinse BID  . Chlorhexidine Gluconate Cloth  6 each Topical Daily  . cycloSPORINE  1 drop Both Eyes BID  . diltiazem  30 mg Per Tube Q6H  . feeding supplement (PRO-STAT SUGAR FREE 64)  30 mL Per Tube BID  . FLUoxetine  80 mg Per Tube Daily  . free water  300 mL Per Tube Q4H  . insulin aspart  0-15 Units Subcutaneous Q4H  . insulin glargine  10 Units Subcutaneous QHS  . mouth rinse  15 mL Mouth Rinse QID  . metoprolol tartrate  50 mg Per Tube BID  . pantoprazole sodium  40 mg Per Tube BID  . predniSONE  7.5 mg Per Tube Q breakfast  . QUEtiapine  100 mg Per Tube QHS  . sodium chloride flush  10-40 mL Intracatheter Q12H  . sucralfate  1 g Per Tube BID    LOS: 19 days   Talmage Teaster A 01/22/2017,7:54 AM

## 2017-01-22 NOTE — Progress Notes (Signed)
Patient seen for trach team follow up.  All needed equipment at the bedside.  No education needed at this time.  Will continue to follow for progression.  

## 2017-01-22 NOTE — Progress Notes (Signed)
eLink Physician-Brief Progress Note Patient Name: Morgan Cooper DOB: Sep 12, 1945 MRN: [id number]   Date of Service  01/22/2017  HPI/Events of Note  Hypotension - BP = 71/56. Likely medication related. Patient has received Metoprolol 50 mg PO, Diltiazem 60 mg PO and Seroquel PO tonight. Last LVER = 55% to 60%.  eICU Interventions  Will order: 1. Bolus with 0.9 NaCl 1 liter IV over 1 hour now. 2. Decrease Diltiazem dose to 30 mg PO Q 6 hours.      Intervention Category Major Interventions: Hypotension - evaluation and management  Juliana Boling Eugene 01/22/2017, 1:17 AM

## 2017-01-22 NOTE — Progress Notes (Deleted)
Attempted to place tracheostomy.  Skin incision was done and dissected but was unable to enter airway.  Therefore, assistance from trauma surgery was requested.  Dr. Grandville Silos came to assist and finish the procedure.  No complications noted.  Rush Farmer, M.D. [facility] Pulmonary/Critical Care Medicine. Pager: [phone]. After hours pager: [phone].

## 2017-01-22 NOTE — Care Management Note (Signed)
Case Management Note  Patient Details  Name: Morgan Cooper MRN: [id number] Date of Birth: January 15, 1946  Subjective/Objective:   Pt admitted with SOB and DOE                  Action/Plan:  PTA from home, ESRD pt s/p renal transplant.  Pt remains intubated - may receive trach and at that time will be appropriate for [facility] referral per attending (physician advisor is in agreement)  CM will continue to follow for discharge needs   Expected Discharge Date:                  Expected Discharge Plan:  Long Term Acute Care (LTAC)  In-House Referral:  Clinical Social Work  Discharge planning Services  CM Consult  Post Acute Care Choice:    Choice offered to:     DME Arranged:    DME Agency:     HH Arranged:    [place] Agency:     Status of Service:  In process, will continue to follow  If discussed at Long Length of Stay Meetings, dates discussed:    Additional Comments: 01/22/2017  Daughter and pt chose [facility].  Tentative discharge tomorrow to facility.  [facility] aware of pts choice  01/19/17 Unfortunately pt could not sustain without ventilation and was therefore trached.  Both agencies informed of pts new status and official [facility] referral.  Per PCCM pt will not be stable for transfer to facility until Monday 01/22/17.  CM spoke directly with daughter Danton Clap and informed her of LTACH recommendation, provided choice including both [facility] and [facility].  Both liaisons to follow up with daughter directly and CM will follow back up with daughter on 01/22/17   Pt was extubated today however respiratory status remains tenuous - CM will continue to follow for discharge needs including LTACH if pt requires re-intubation/trach per attending Maryclare Labrador, RN 01/22/2017, 4:14 PM

## 2017-01-23 ENCOUNTER — Inpatient Hospital Stay (HOSPITAL_COMMUNITY): Payer: Medicare Other

## 2017-01-23 DIAGNOSIS — Z930 Tracheostomy status: Secondary | ICD-10-CM

## 2017-01-23 LAB — CBC
HCT: 26.8 % — ABNORMAL LOW (ref 36.0–46.0)
Hemoglobin: 8.8 g/dL — ABNORMAL LOW (ref 12.0–15.0)
MCH: 30.7 pg (ref 26.0–34.0)
MCHC: 32.8 g/dL (ref 30.0–36.0)
MCV: 93.4 fL (ref 78.0–100.0)
PLATELETS: 222 10*3/uL (ref 150–400)
RBC: 2.87 MIL/uL — AB (ref 3.87–5.11)
RDW: 14.8 % (ref 11.5–15.5)
WBC: 10 10*3/uL (ref 4.0–10.5)

## 2017-01-23 LAB — BASIC METABOLIC PANEL
ANION GAP: 10 (ref 5–15)
BUN: 60 mg/dL — ABNORMAL HIGH (ref 6–20)
CALCIUM: 8.5 mg/dL — AB (ref 8.9–10.3)
CO2: 26 mmol/L (ref 22–32)
Chloride: 102 mmol/L (ref 101–111)
Creatinine, Ser: 1.45 mg/dL — ABNORMAL HIGH (ref 0.44–1.00)
GFR, EST AFRICAN AMERICAN: 41 mL/min — AB (ref >60)
GFR, EST NON AFRICAN AMERICAN: 35 mL/min — AB (ref >60)
Glucose, Bld: 118 mg/dL — ABNORMAL HIGH (ref 65–99)
Potassium: 3.4 mmol/L — ABNORMAL LOW (ref 3.5–5.1)
Sodium: 138 mmol/L (ref 135–145)

## 2017-01-23 LAB — GLUCOSE, CAPILLARY
GLUCOSE-CAPILLARY: 113 mg/dL — AB (ref 65–99)
GLUCOSE-CAPILLARY: 136 mg/dL — AB (ref 65–99)
GLUCOSE-CAPILLARY: 137 mg/dL — AB (ref 65–99)
Glucose-Capillary: 148 mg/dL — ABNORMAL HIGH (ref 65–99)
Glucose-Capillary: 202 mg/dL — ABNORMAL HIGH (ref 65–99)
Glucose-Capillary: 132 mg/dL — ABNORMAL HIGH (ref 65–99)

## 2017-01-23 LAB — HEPARIN LEVEL (UNFRACTIONATED): HEPARIN UNFRACTIONATED: 0.44 [IU]/mL (ref 0.30–0.70)

## 2017-01-23 MED ORDER — FENTANYL 50 MCG/HR TD PT72
50.0000 ug | MEDICATED_PATCH | TRANSDERMAL | Status: DC
  Administered 2017-01-23: 50 ug via TRANSDERMAL
  Filled 2017-01-23: qty 1

## 2017-01-23 MED ORDER — POTASSIUM CHLORIDE 20 MEQ PO PACK
40.0000 meq | PACK | Freq: Once | ORAL | Status: DC
  Filled 2017-01-23: qty 2

## 2017-01-23 MED ORDER — FREE WATER
300.0000 mL | Freq: Three times a day (TID) | Status: DC
  Administered 2017-01-23 – 2017-01-24 (×3): 300 mL

## 2017-01-23 MED ORDER — POTASSIUM CHLORIDE 20 MEQ/15ML (10%) PO SOLN
40.0000 meq | Freq: Once | ORAL | Status: AC
  Administered 2017-01-23: 40 meq
  Filled 2017-01-23: qty 30

## 2017-01-23 MED ORDER — FENTANYL CITRATE (PF) 100 MCG/2ML IJ SOLN
50.0000 ug | Freq: Once | INTRAMUSCULAR | Status: AC
  Administered 2017-01-23: 50 ug via INTRAVENOUS
  Filled 2017-01-23: qty 2

## 2017-01-23 MED ORDER — HEPARIN (PORCINE) IN NACL 100-0.45 UNIT/ML-% IJ SOLN
1200.0000 [IU]/h | INTRAMUSCULAR | Status: DC
  Administered 2017-01-24: 1100 [IU]/h via INTRAVENOUS

## 2017-01-23 MED ORDER — POTASSIUM CHLORIDE 20 MEQ/15ML (10%) PO SOLN
20.0000 meq | Freq: Once | ORAL | Status: AC
  Administered 2017-01-23: 20 meq
  Filled 2017-01-23: qty 15

## 2017-01-23 MED ORDER — FUROSEMIDE 10 MG/ML IJ SOLN
20.0000 mg | Freq: Once | INTRAMUSCULAR | Status: AC
  Administered 2017-01-23: 20 mg via INTRAVENOUS
  Filled 2017-01-23: qty 2

## 2017-01-23 NOTE — Progress Notes (Signed)
PULMONARY / CRITICAL CARE MEDICINE   Name: Morgan Cooper MRN: [id number] DOB: [birth year]    ADMISSION DATE:01/03/2017  CONSULTATION DATE:01/04/17  REFERRING ZM:OQHUT Phillip Heal, NP / TRH  CHIEF COMPLAINT:Acute Hypoxic Respiratory Failure   Presenting HPI:  71 y.o. female with prior history of tobacco use. Presented to hospital with acute hypoxic respiratory failure and atrial fibrillation with rapid ventricular response. Concern for possible healthcare associated pneumonia. 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 Also history of COPD with emphysema & ILD presumably from [place]. Patient has a known history of atrial fibrillation, stroke, sleep apnea, macular degeneration, diastolic congestive heart failure, and gastroparesis.  Subjective:  RN reports pt is agitated this am.  Continues to have bleeding from trach site.    Vent Mode: PCV FiO2 (%):  [30 %-40 %] 40 % Set Rate:  [10 bmp-16 bmp] 10 bmp Vt Set:  [460 mL] 460 mL PEEP:  [5 cmH20-8 cmH20] 5 cmH20 Pressure Support:  [10 cmH20] 10 cmH20 Plateau Pressure:  [17 cmH20-26 cmH20] 21 cmH20  Temp:  [98.4 F (36.9 C)-99.5 F (37.5 C)] 98.6 F (37 C) (12/04 0400) Pulse Rate:  [99-131] 112 (12/04 0900) Resp:  [15-35] 21 (12/04 0900) BP: (81-159)/(55-111) 148/103 (12/04 0900) SpO2:  [91 %-100 %] 96 % (12/04 0900) FiO2 (%):  [30 %-40 %] 40 % (12/04 0840) Weight:  [123 lb 0.3 oz (55.8 kg)] 123 lb 0.3 oz (55.8 kg) (12/04 0338)  General:  Chronically ill appearing female in NAD, lying in bed HEENT: MM pink/moist, trach midline, dried bloody secretions at site, small bore feeding tube in place Neuro: intermittently agitated, tearful at times, MAE, easily reoriented  CV: s1s2 irr irr, no m/r/g PULM: even/non-labored, lungs bilaterally coarse, diminished bases  ML:YYTK, non-tender, bsx4 active  Extremities: warm/dry, no edema  Skin: no rashes or lesions   CBC Latest Ref Rng & Units  01/23/2017 01/22/2017 01/21/2017  WBC 4.0 - 10.5 K/uL 10.0 10.2 13.7(H)  Hemoglobin 12.0 - 15.0 g/dL 8.8(L) 8.1(L) 9.0(L)  Hematocrit 36.0 - 46.0 % 26.8(L) 25.2(L) 28.3(L)  Platelets 150 - 400 K/uL 222 248 321   BMP Latest Ref Rng & Units 01/23/2017 01/22/2017 01/21/2017  Glucose 65 - 99 mg/dL 118(H) 144(H) 155(H)  BUN 6 - 20 mg/dL 60(H) 74(H) 84(H)  Creatinine 0.44 - 1.00 mg/dL 1.45(H) 1.70(H) 1.98(H)  Sodium 135 - 145 mmol/L 138 141 141  Potassium 3.5 - 5.1 mmol/L 3.4(L) 4.3 3.4(L)  Chloride 101 - 111 mmol/L 102 108 105  CO2 22 - 32 mmol/L 26 26 26   Calcium 8.9 - 10.3 mg/dL 8.5(L) 7.9(L) 8.2(L)   Hepatic Function Latest Ref Rng & Units 01/16/2017 01/15/2017 01/14/2017  Total Protein 6.5 - 8.1 g/dL - - -  Albumin 3.5 - 5.0 g/dL 2.2(L) 2.1(L) 1.9(L)  AST 15 - 41 U/L - - -  ALT 14 - 54 U/L - - -  Alk Phosphatase 38 - 126 U/L - - -  Total Bilirubin 0.3 - 1.2 mg/dL - - -  Bilirubin, Direct <=0.2 mg/dL - - -    IMAGING/STUDIES: CTA CHEST 11/14 > No PE, diffuse airspace disease, emphysema TTE 11/15:  LV normal in size w/ EF 55-60%. LA moderately dilated & RA moderately dilated. RV mildly dilated with preserved systolic function. Trivial aortic regurgitation. Trivial mitral regurgitation without stenosis. No pulmonic regurgitation. Mild tricuspid regurgitation. No pericardial effusion. CT chest 11/30> Air tracking along the trachea, esophagus.  Worsening interstitial opacities, lung fibrosis CXR 12/2 > no mediastinal  air noted, trach is good position.  CT Chest 12/4 >> resolution of previously seen pneumomediastinum, severe COPD, moderate bilateral pleural effusions, extensive bilateral airspace disease, CAD / aortic atherosclerosis  MICROBIOLOGY: MRSA PCR 11/14:  Negative Blood Cultures x2 11/14:  Negative Tracheal Aspirate Culture 11/16:  Normal Respiratory Flora  Respiratory Panel PCR 11/15:  Negative  Influenza A/B PCR  11/14:  Negative  Urine Streptococcal Antigen 11/14:  Negative   Urine Legionella Antigen 11/14:  Negative  UC 12/1 >> 100k pseudomonas aeruginosa >> S-imipenem, zosyn (but MIC 32) BC 12/1 >>   ANTIBIOTICS: Azithromycin 11/14 - 11/15 Rocephin 11/14 - 11/15 Vancomycin 11/14 - 11/18 Zosyn 11/15 - 11/22 Vanco 12/1 >> Zosyn 12/1 >> 12/3 Imipenem 12/3 >>   LINES/TUBES: OETT 11/16 >> 11/30 Trach 11/30 >> R IJ CVL 11/16 >> 11/29 Foley >> replace 11/26 >> OGT >>  SIGNIFICANT EVENTS: 11/14 - Admit 11/16 - Transfer to ICU with worsening respiratory status 11/18 - Changed to pressure control ventilation  11/20 - Didn't tolerate attempt to switch back from Pressure Control to PRVC. Heparin drip stopped w/ anemia.  11/21 - Started Seroquel 39m BID.  11/23 - More calm and awake today. Tolerating PS 5/5 today w/ TV >800cc.  11/25 - Following commands today & more calm. Tolerating PS 5/5 again today. 11/26 - replacing foley, mild abdominal distention 11/28 - upper endoscopy with moderate gastritis  11/30 - Failed extubation. Trach done 12/03 - Hypotensive overnight. Give fluid bolus and lopressor decreased  ASSESSMENT/PLAN:  71y.o.  female with chronic immunosuppression, renal transplant, ILD, afib. Admitted with acute resp failure presumed secondary to HCAP.  She has had a prolonged hospitalization.  Failed extubation and had a trach placed Noted to have mediastinal air after intubation and trach placement.  Discussed with radiologist and we are unable to do a esophagram in a patient who cannot swallow. There is low suspicion for esophageal injury so we will continue to follow clinically and get repeat CT 12/4   PULMONARY A: Acute on chronic respiratory failure HCAP, Aspiration pneumonia - concern for aspiration 11/30 with repeat intubation Status post trach Mediastinal air - post trach, resolved on CT Tracheal Site Bleeding  Moderate Bilateral Pleural Effusions  P:   PCV as rest mode PSV wean as tolerated with goal of ATC Follow CXR  CT  reviewed with resolution of pneumomediastinum Trach care per protocol  Lasix as below  CARDIOVASCULAR A:  Atrial fibrillation Pulmonary HTN Acute on chronic diastolic congestive heart failure CAD / Aortic Atherosclerosis - seen on CT chest P:  Hold heparin for 6 hours, then restart without bolus Rate control with cardizem + lopressor Lopressor reduced 12/1 due to hypotension  Low dose lasix x1  Continue lipitor   RENAL A:   Renal failure s/p kidney transplant - required CVVHD P:   Continue home imuran + steroids  Free water 300 ml Q4 Trend BMP / urinary output Replace electrolytes as indicated Avoid nephrotoxic agents, ensure adequate renal perfusion Appreciate Nephrology   GASTROINTESTINAL A:   Melena, GIB Gastritis on EGD P:   PPI BID  Sucralfate  May need repeat EGD if further drop in Hgb (stable) TF per Nutrition   HEMATOLOGIC A:   Leukocytosis Anemia P:  Trend CBC  Monitor trach site bleeding  Heparin gtt per pharmacy, appreciate assistance   INFECTIOUS A:   HCAP, aspiration PNA Pseudomonal UTI - will treat as active infection given immune suppression & 100K growth  P:   Continue  meropenem  Trend PCT  Monitor fever curve / WBC trend  Await blood cultures   ENDOCRINE A:   Adrenal insufficieny, on chronic steroids  P:   Continue home prednisone  Lantus + SSI   NEUROLOGIC A:   Anxiety, encephalopahty P:   Fentanyl PRN  Continue seroquel, prozac   FAMILY  - Updates: No family at bedside 12/4 am.  Will update on arrival.    - Global:  Plan for LTAC transfer soon.  Trach site continues to ooze.  Holding heparin, restart late pm and observe.    CC Time:  68 minutes   Noe Gens, NP-C [place] Pulmonary & Critical Care Pgr: [phone] or if no answer [phone] 01/23/2017, 9:36 AM  Attending Note:  71 year old female with history of COPD who presents to PCCM with a-fib and pulmonary edema with respiratory failure.  Patient was trached on  Friday and did not tolerate TC today.  On exam, lungs with coarse BS diffusely.  I reviewed CXR myself, trach is in good position.  Will continue weaning efforts at PS for now.  Hold heparin for trach bleeding.  Will need PEG placement.  Hold in ICU given respiratory demand.  PCCM will follow.  The patient is critically ill with multiple organ systems failure and requires high complexity decision making for assessment and support, frequent evaluation and titration of therapies, application of advanced monitoring technologies and extensive interpretation of multiple databases.   Critical Care Time devoted to patient care services described in this note is  35  Minutes. This time reflects time of care of this signee Dr Jennet Maduro. This critical care time does not reflect procedure time, or teaching time or supervisory time of PA/NP/Med student/Med Resident etc but could involve care discussion time.  Rush Farmer, M.D. [facility] Pulmonary/Critical Care Medicine. Pager: [phone]. After hours pager: [phone].

## 2017-01-23 NOTE — Progress Notes (Signed)
[place] for heparin Indication: atrial fibrillation  Allergies  Allergen Reactions  . Amoxicillin Diarrhea    Has patient had a PCN reaction causing immediate rash, facial/tongue/throat swelling, SOB or lightheadedness with hypotension: No Has patient had a PCN reaction causing severe rash involving mucus membranes or skin necrosis: No Has patient had a PCN reaction that required hospitalization No Has patient had a PCN reaction occurring within the last 10 years: Yes If all of the above answers are "NO", then may proceed with Cephalosporin use.   . Tape Rash    Patient Measurements: Height: 5\' 5"  (165.1 cm) Weight: 123 lb 0.3 oz (55.8 kg) IBW/kg (Calculated) : 57 Heparin Dosing Weight: 54.2 kg  Vital Signs: Temp: 98.6 F (37 C) (12/04 0400) Temp Source: Oral (12/04 0000) BP: 136/92 (12/04 0830) Pulse Rate: 116 (12/04 0830)  Labs: Recent Labs    01/20/17 0854 01/21/17 0524 01/21/17 1704 01/22/17 0344 01/23/17 0321  HGB 11.0* 8.3* 9.0* 8.1* 8.8*  HCT 34.2* 26.4* 28.3* 25.2* 26.8*  PLT 608* 337 321 248 222  LABPROT 14.1  --   --   --   --   INR 1.10  --   --   --   --   HEPARINUNFRC  --  0.64  --  0.64 0.44  CREATININE 2.14* 1.98*  --  1.70* 1.45*    Medical History: Past Medical History:  Diagnosis Date  . Anxiety   . Arthritis    hips, legs, hand   . Barrett's esophagus   . Bone pain   . Bruises easily   . Cancer ([place])    skin cancer that has been removed- L hand  . CHF (congestive heart failure) ([place])   . Chills   . CVA (cerebral infarction)    Mini Strokes   . Depression   . Disturbed concentration   . Diverticulitis of large intestine with perforation Hx colectomy  . Esophageal stricture   . Fatigue   . Forgetfulness   . Gastroparesis   . Generalized headaches   . GERD (gastroesophageal reflux disease)   . Heart failure, diastolic, chronic ([place])   . History of hiatal hernia   . Hx of cardiovascular  stress test    Lexiscan Myoview (06/2013):  No ischemia, EF 72%; Low Risk  . Hyperlipemia   . Hypertension   . Irritable   . Itch    skin  . Keratosis   . Macular degeneration    left eye  . Nausea    little vomitting  . Osteoporosis   . Pancreatic cyst   . Pancreatic mass   . Panic attacks   . Persistent headaches   . Pulmonary hypertension ([place])   . Rapid heart rate    some times  . Renal failure    transplant - 1983,  followed by Dr. Justin Mend   . Sleep apnea    uses at bedtime- 2liters/min , negative- apnea., last study- 2/017  . Sleep difficulties   . Stroke ([place])   . TIA (transient ischemic attack)      Assessment: Pt is on Eliquis pta for AFib. Last dose was on 11/13. All anticoagulation has been held during admission due to melena (now resolved). The pt had an EGD which found gastritis as likely cause of melena. Remains on ppi bid.  Trach site continues to bleed. Currently back on IV heparin at 1100 units/h. Her heparin level this morning was therapeutic. Hgb 8.8. Plt wnl.  SCr down to 1.45  Goal of Therapy:  Heparin level 0.3-0.7 units/ml Monitor platelets by anticoagulation protocol: Yes    Plan:  Hold IV heparin x 6 hours per discussion with team  Resume IV heparin at 1600  Daily HL, CBC   Albertina Parr, PharmD., BCPS Clinical Pharmacist Pager [phone]

## 2017-01-23 NOTE — Progress Notes (Signed)
71 y.o.year-old  hx of ESRD sp renal transplant (LRD, 1984, pred/ imuran) Presented 11/14 w/SOB/ DOE and CP. Recent new AF on BB and Eliquis. Hx of dCHF. Hypoxic on adm. BNP was up, initial Rx IV diltiazem for afib /RVR,not felt to be vol overloaded. CT angio w/ contrast no PE but RUL air space disease, pulm edema vs infection. Lactic acid 1.45, WBC 9k. Admitted, rx'd with IV diltiazem, po metoprolol, IV abx (rocephin, vanc/ zosyn, now meropenam) for PNA. Intubated 11/15. Admit creat 0.8, AKI due to above- now trending down- no dialytic intervention required    Impression: 1. AKI -creatinineslow improvement- 1.45 today  2. Renal transplantwith dysfunction- Creat normal 0.8. Peak creatinine 4.3.Imuran and prednisone. Dr Justin Mend at [facility]. On home doses 3. Hypernatremia, resolved- will dec free water 4. VDRF - s/p emergency tracheostomy after extubation 11/30 cx by mediastinal air 5. Multifocal PNA / hx pulm fibrosis and COPD - slow recovery - meropenam 6. AF with RVR,recurrent- now rate in the low 100's - dilt and metoprolol 7. dCHF, great UOP- - no diuretics for now, is autodiuresing 8. Anemia -transfused, Endo donew/ diffuse gastritis ? Worse azotemia due to recurrent GIB- - also on heparin ?? 9.   Diarrhea -improved 10 Hypokalemia- resolved after repletion- give a little today    Subjective: Interval History: Did have some more transient hypotension overnight- 3500 of UOP Objective: Vital signs in last 24 hours: Temp:  [97.5 F (36.4 C)-99.5 F (37.5 C)] 98.6 F (37 C) (12/04 0400) Pulse Rate:  [99-131] 120 (12/04 0600) Resp:  [15-35] 19 (12/04 0600) BP: (81-159)/(55-111) 149/105 (12/04 0600) SpO2:  [94 %-100 %] 99 % (12/04 0600) FiO2 (%):  [30 %-40 %] 30 % (12/04 0408) Weight:  [55.8 kg (123 lb 0.3 oz)] 55.8 kg (123 lb 0.3 oz) (12/04 0338) Weight change: 0.3 kg (10.6 oz)  Intake/Output from previous day: 12/03 0701 - 12/04 0700 In: 1289.6 [I.V.:334.6; NG/GT:755; IV  Piggyback:200] Out: 3580 [Urine:3580] Intake/Output this shift: No intake/output data recorded.  Head: Normocephalic, without obvious abnormality, atraumatic Throat: trach Chest wall: no tenderness Extremities: edema trace to 1+ at knees  awake  Lab Results: Recent Labs    01/22/17 0344 01/23/17 0321  WBC 10.2 10.0  HGB 8.1* 8.8*  HCT 25.2* 26.8*  PLT 248 222   BMET:  Recent Labs    01/22/17 0344 01/23/17 0321  NA 141 138  K 4.3 3.4*  CL 108 102  CO2 26 26  GLUCOSE 144* 118*  BUN 74* 60*  CREATININE 1.70* 1.45*  CALCIUM 7.9* 8.5*   No results for input(s): PTH in the last 72 hours. Iron Studies: No results for input(s): IRON, TIBC, TRANSFERRIN, FERRITIN in the last 72 hours. Studies/Results: Dg Chest Port 1 View  Result Date: 01/22/2017 CLINICAL DATA:  Acute respiratory failure. EXAM: PORTABLE CHEST 1 VIEW COMPARISON:  01/21/2017. FINDINGS: Tracheostomy tube and feeding tube remain in good position. Cardiomegaly is stable. BILATERAL pulmonary opacities are unchanged. There is no pneumothorax. IMPRESSION: Stable chest. Electronically Signed   By: Staci Righter M.D.   On: 01/22/2017 07:08    Scheduled: . atorvastatin  20 mg Per Tube Daily  . azaTHIOprine  50 mg Oral Daily  . chlorhexidine  15 mL Mouth Rinse BID  . cycloSPORINE  1 drop Both Eyes BID  . diltiazem  30 mg Per Tube Q6H  . feeding supplement (PRO-STAT SUGAR FREE 64)  30 mL Per Tube BID  . FLUoxetine  80 mg Per Tube Daily  .  free water  300 mL Per Tube Q4H  . insulin aspart  0-15 Units Subcutaneous Q4H  . insulin glargine  10 Units Subcutaneous QHS  . mouth rinse  15 mL Mouth Rinse q12n4p  . metoprolol tartrate  50 mg Per Tube BID  . pantoprazole sodium  40 mg Per Tube BID  . predniSONE  7.5 mg Per Tube Q breakfast  . QUEtiapine  100 mg Per Tube QHS  . sucralfate  1 g Per Tube BID    LOS: 20 days   Seraj Dunnam A 01/23/2017,7:14 AM

## 2017-01-23 NOTE — Progress Notes (Signed)
Patient transported to CT and back to 2M14 without any incident.

## 2017-01-24 ENCOUNTER — Other Ambulatory Visit (HOSPITAL_COMMUNITY): Payer: Medicare Other

## 2017-01-24 ENCOUNTER — Inpatient Hospital Stay
Admission: RE | Admit: 2017-01-24 | Discharge: 2017-02-20 | Disposition: E | Payer: Medicare Other | Source: Other Acute Inpatient Hospital | Attending: Urology | Admitting: Urology

## 2017-01-24 ENCOUNTER — Encounter (HOSPITAL_COMMUNITY): Payer: Self-pay | Admitting: Pulmonary Disease

## 2017-01-24 ENCOUNTER — Inpatient Hospital Stay (HOSPITAL_COMMUNITY): Payer: Medicare Other

## 2017-01-24 DIAGNOSIS — I509 Heart failure, unspecified: Secondary | ICD-10-CM

## 2017-01-24 DIAGNOSIS — R140 Abdominal distension (gaseous): Secondary | ICD-10-CM | POA: Diagnosis not present

## 2017-01-24 DIAGNOSIS — J811 Chronic pulmonary edema: Secondary | ICD-10-CM

## 2017-01-24 DIAGNOSIS — E876 Hypokalemia: Secondary | ICD-10-CM | POA: Diagnosis present

## 2017-01-24 DIAGNOSIS — I5033 Acute on chronic diastolic (congestive) heart failure: Secondary | ICD-10-CM | POA: Diagnosis present

## 2017-01-24 DIAGNOSIS — K2970 Gastritis, unspecified, without bleeding: Secondary | ICD-10-CM | POA: Diagnosis present

## 2017-01-24 DIAGNOSIS — E785 Hyperlipidemia, unspecified: Secondary | ICD-10-CM | POA: Diagnosis not present

## 2017-01-24 DIAGNOSIS — R1310 Dysphagia, unspecified: Secondary | ICD-10-CM | POA: Diagnosis present

## 2017-01-24 DIAGNOSIS — Z4659 Encounter for fitting and adjustment of other gastrointestinal appliance and device: Secondary | ICD-10-CM

## 2017-01-24 DIAGNOSIS — Z8673 Personal history of transient ischemic attack (TIA), and cerebral infarction without residual deficits: Secondary | ICD-10-CM | POA: Diagnosis not present

## 2017-01-24 DIAGNOSIS — Z931 Gastrostomy status: Secondary | ICD-10-CM

## 2017-01-24 DIAGNOSIS — J9621 Acute and chronic respiratory failure with hypoxia: Secondary | ICD-10-CM | POA: Diagnosis present

## 2017-01-24 DIAGNOSIS — I4891 Unspecified atrial fibrillation: Secondary | ICD-10-CM

## 2017-01-24 DIAGNOSIS — I482 Chronic atrial fibrillation: Secondary | ICD-10-CM | POA: Diagnosis present

## 2017-01-24 DIAGNOSIS — K567 Ileus, unspecified: Secondary | ICD-10-CM

## 2017-01-24 DIAGNOSIS — Z940 Kidney transplant status: Secondary | ICD-10-CM | POA: Diagnosis not present

## 2017-01-24 DIAGNOSIS — G4733 Obstructive sleep apnea (adult) (pediatric): Secondary | ICD-10-CM | POA: Diagnosis present

## 2017-01-24 DIAGNOSIS — G2581 Restless legs syndrome: Secondary | ICD-10-CM | POA: Diagnosis present

## 2017-01-24 DIAGNOSIS — B965 Pseudomonas (aeruginosa) (mallei) (pseudomallei) as the cause of diseases classified elsewhere: Secondary | ICD-10-CM | POA: Diagnosis present

## 2017-01-24 DIAGNOSIS — Z978 Presence of other specified devices: Secondary | ICD-10-CM

## 2017-01-24 DIAGNOSIS — J810 Acute pulmonary edema: Secondary | ICD-10-CM | POA: Diagnosis not present

## 2017-01-24 DIAGNOSIS — I10 Essential (primary) hypertension: Secondary | ICD-10-CM | POA: Diagnosis not present

## 2017-01-24 DIAGNOSIS — N390 Urinary tract infection, site not specified: Secondary | ICD-10-CM | POA: Diagnosis present

## 2017-01-24 DIAGNOSIS — Z930 Tracheostomy status: Secondary | ICD-10-CM

## 2017-01-24 DIAGNOSIS — N179 Acute kidney failure, unspecified: Secondary | ICD-10-CM | POA: Diagnosis not present

## 2017-01-24 DIAGNOSIS — J449 Chronic obstructive pulmonary disease, unspecified: Secondary | ICD-10-CM | POA: Diagnosis present

## 2017-01-24 DIAGNOSIS — R0603 Acute respiratory distress: Secondary | ICD-10-CM

## 2017-01-24 DIAGNOSIS — E875 Hyperkalemia: Secondary | ICD-10-CM | POA: Diagnosis not present

## 2017-01-24 DIAGNOSIS — Z6820 Body mass index (BMI) 20.0-20.9, adult: Secondary | ICD-10-CM | POA: Diagnosis not present

## 2017-01-24 DIAGNOSIS — N185 Chronic kidney disease, stage 5: Secondary | ICD-10-CM | POA: Diagnosis not present

## 2017-01-24 DIAGNOSIS — F418 Other specified anxiety disorders: Secondary | ICD-10-CM | POA: Diagnosis present

## 2017-01-24 DIAGNOSIS — Z66 Do not resuscitate: Secondary | ICD-10-CM | POA: Diagnosis not present

## 2017-01-24 DIAGNOSIS — Z87891 Personal history of nicotine dependence: Secondary | ICD-10-CM | POA: Diagnosis not present

## 2017-01-24 DIAGNOSIS — J9690 Respiratory failure, unspecified, unspecified whether with hypoxia or hypercapnia: Secondary | ICD-10-CM | POA: Diagnosis not present

## 2017-01-24 DIAGNOSIS — I272 Pulmonary hypertension, unspecified: Secondary | ICD-10-CM | POA: Diagnosis present

## 2017-01-24 DIAGNOSIS — J90 Pleural effusion, not elsewhere classified: Secondary | ICD-10-CM | POA: Diagnosis not present

## 2017-01-24 DIAGNOSIS — G9341 Metabolic encephalopathy: Secondary | ICD-10-CM | POA: Diagnosis present

## 2017-01-24 DIAGNOSIS — J189 Pneumonia, unspecified organism: Secondary | ICD-10-CM | POA: Diagnosis not present

## 2017-01-24 DIAGNOSIS — E440 Moderate protein-calorie malnutrition: Secondary | ICD-10-CM | POA: Diagnosis present

## 2017-01-24 DIAGNOSIS — Z4682 Encounter for fitting and adjustment of non-vascular catheter: Secondary | ICD-10-CM | POA: Diagnosis not present

## 2017-01-24 DIAGNOSIS — J9601 Acute respiratory failure with hypoxia: Secondary | ICD-10-CM | POA: Diagnosis not present

## 2017-01-24 DIAGNOSIS — J95851 Ventilator associated pneumonia: Secondary | ICD-10-CM | POA: Diagnosis not present

## 2017-01-24 LAB — GLUCOSE, CAPILLARY
GLUCOSE-CAPILLARY: 112 mg/dL — AB (ref 65–99)
GLUCOSE-CAPILLARY: 131 mg/dL — AB (ref 65–99)
GLUCOSE-CAPILLARY: 165 mg/dL — AB (ref 65–99)
Glucose-Capillary: 140 mg/dL — ABNORMAL HIGH (ref 65–99)
Glucose-Capillary: 150 mg/dL — ABNORMAL HIGH (ref 65–99)
Glucose-Capillary: 144 mg/dL — ABNORMAL HIGH (ref 65–99)

## 2017-01-24 LAB — RENAL FUNCTION PANEL
ALBUMIN: 2.4 g/dL — AB (ref 3.5–5.0)
Anion gap: 15 (ref 5–15)
BUN: 47 mg/dL — AB (ref 6–20)
CALCIUM: 8.7 mg/dL — AB (ref 8.9–10.3)
CO2: 21 mmol/L — ABNORMAL LOW (ref 22–32)
Chloride: 100 mmol/L — ABNORMAL LOW (ref 101–111)
Creatinine, Ser: 1.34 mg/dL — ABNORMAL HIGH (ref 0.44–1.00)
GFR calc Af Amer: 45 mL/min — ABNORMAL LOW (ref >60)
GFR, EST NON AFRICAN AMERICAN: 39 mL/min — AB (ref >60)
GLUCOSE: 157 mg/dL — AB (ref 65–99)
PHOSPHORUS: 2.3 mg/dL — AB (ref 2.5–4.6)
Potassium: 4.1 mmol/L (ref 3.5–5.1)
SODIUM: 136 mmol/L (ref 135–145)

## 2017-01-24 LAB — CBC
HCT: 27 % — ABNORMAL LOW (ref 36.0–46.0)
HEMOGLOBIN: 8.8 g/dL — AB (ref 12.0–15.0)
MCH: 30.9 pg (ref 26.0–34.0)
MCHC: 32.6 g/dL (ref 30.0–36.0)
MCV: 94.7 fL (ref 78.0–100.0)
Platelets: 210 10*3/uL (ref 150–400)
RBC: 2.85 MIL/uL — ABNORMAL LOW (ref 3.87–5.11)
RDW: 15.4 % (ref 11.5–15.5)
WBC: 11.8 10*3/uL — AB (ref 4.0–10.5)

## 2017-01-24 LAB — HEPARIN LEVEL (UNFRACTIONATED): HEPARIN UNFRACTIONATED: 0.17 [IU]/mL — AB (ref 0.30–0.70)

## 2017-01-24 MED ORDER — FREE WATER: 200.0000 mL | Freq: Three times a day (TID) | Status: AC

## 2017-01-24 MED ORDER — NEPRO/CARBSTEADY PO LIQD: 1000.0000 mL | ORAL | 0 refills | Status: AC

## 2017-01-24 MED ORDER — PRO-STAT SUGAR FREE PO LIQD: 30.0000 mL | Freq: Two times a day (BID) | ORAL | 0 refills | Status: AC

## 2017-01-24 MED ORDER — ORAL CARE MOUTH RINSE: 15.0000 mL | Freq: Two times a day (BID) | OROMUCOSAL | 0 refills | Status: AC

## 2017-01-24 MED ORDER — SODIUM CHLORIDE 0.9 % IV SOLN: 1.0000 g | Freq: Two times a day (BID) | INTRAVENOUS | Status: AC

## 2017-01-24 MED ORDER — METOPROLOL TARTRATE 5 MG/5ML IV SOLN: 5.0000 mg | INTRAVENOUS | Status: AC | PRN

## 2017-01-24 MED ORDER — APIXABAN 5 MG PO TABS
5.0000 mg | ORAL_TABLET | Freq: Two times a day (BID) | ORAL | Status: DC
  Administered 2017-01-24: 5 mg via ORAL
  Filled 2017-01-24 (×2): qty 1

## 2017-01-24 MED ORDER — METOPROLOL TARTRATE 50 MG PO TABS: 50.0000 mg | ORAL_TABLET | Freq: Two times a day (BID) | ORAL | 11 refills | Status: AC

## 2017-01-24 MED ORDER — INSULIN ASPART 100 UNIT/ML ~~LOC~~ SOLN: 0.0000 [IU] | SUBCUTANEOUS | 11 refills | Status: AC

## 2017-01-24 MED ORDER — DARBEPOETIN ALFA 100 MCG/0.5ML IJ SOSY
100.0000 ug | PREFILLED_SYRINGE | Freq: Once | INTRAMUSCULAR | Status: AC
  Administered 2017-01-24: 100 ug via SUBCUTANEOUS
  Filled 2017-01-24: qty 0.5

## 2017-01-24 MED ORDER — ROPINIROLE HCL 0.25 MG PO TABS
0.2500 mg | ORAL_TABLET | Freq: Two times a day (BID) | ORAL | Status: DC
  Administered 2017-01-24: 0.25 mg via ORAL
  Filled 2017-01-24 (×2): qty 1

## 2017-01-24 MED ORDER — INSULIN GLARGINE 100 UNIT/ML ~~LOC~~ SOLN: 10.0000 [IU] | Freq: Every day | SUBCUTANEOUS | 11 refills | Status: AC

## 2017-01-24 MED ORDER — CHLORHEXIDINE GLUCONATE 0.12 % MT SOLN: 15.0000 mL | Freq: Two times a day (BID) | OROMUCOSAL | 0 refills | Status: AC

## 2017-01-24 MED ORDER — SUCRALFATE 1 GM/10ML PO SUSP: 1.0000 g | Freq: Two times a day (BID) | ORAL | 0 refills | Status: AC

## 2017-01-24 MED ORDER — QUETIAPINE FUMARATE 200 MG PO TABS: 100.0000 mg | ORAL_TABLET | Freq: Every day | ORAL | Status: AC

## 2017-01-24 MED ORDER — FREE WATER
200.0000 mL | Freq: Three times a day (TID) | Status: DC
  Administered 2017-01-24: 200 mL

## 2017-01-24 MED ORDER — TRIMETHOPRIM 100 MG PO TABS: 100.0000 mg | ORAL_TABLET | Freq: Every day | ORAL | Status: AC

## 2017-01-24 MED ORDER — DIAZEPAM 2 MG PO TABS: 2.0000 mg | ORAL_TABLET | Freq: Two times a day (BID) | ORAL | 0 refills | Status: AC

## 2017-01-24 MED ORDER — ROPINIROLE HCL 0.25 MG PO TABS: 0.2500 mg | ORAL_TABLET | Freq: Two times a day (BID) | ORAL | Status: AC

## 2017-01-24 MED ORDER — LEVALBUTEROL HCL 0.63 MG/3ML IN NEBU: 0.6300 mg | INHALATION_SOLUTION | RESPIRATORY_TRACT | 12 refills | Status: AC | PRN

## 2017-01-24 MED ORDER — DILTIAZEM 12 MG/ML ORAL SUSPENSION: 30.0000 mg | Freq: Four times a day (QID) | ORAL | Status: AC

## 2017-01-24 MED ORDER — PANTOPRAZOLE SODIUM 40 MG PO PACK: 40.0000 mg | PACK | Freq: Two times a day (BID) | ORAL | Status: AC

## 2017-01-24 MED ORDER — DIAZEPAM 2 MG PO TABS
2.0000 mg | ORAL_TABLET | Freq: Two times a day (BID) | ORAL | Status: DC
  Administered 2017-01-24: 2 mg via ORAL
  Filled 2017-01-24: qty 1

## 2017-01-24 NOTE — Progress Notes (Signed)
PULMONARY / CRITICAL CARE MEDICINE   Name: Morgan Cooper MRN: [id number] DOB: December 17, 1945    ADMISSION DATE:01/03/2017  CONSULTATION DATE:01/04/17  REFERRING EL:FYBOF Phillip Heal, NP / TRH  CHIEF COMPLAINT:Acute Hypoxic Respiratory Failure   Presenting HPI:  71 y.o. female with prior history of tobacco use. Presented to hospital with acute hypoxic respiratory failure and atrial fibrillation with rapid ventricular response. Concern for possible healthcare associated pneumonia. 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 Patient has a known history of atrial fibrillation, stroke, sleep apnea, macular degeneration, diastolic congestive heart failure, and gastroparesis.  Subjective:     Vent Mode: PSV;CPAP FiO2 (%):  [40 %-60 %] 60 % Set Rate:  [16 bmp] 16 bmp PEEP:  [5 cmH20] 5 cmH20 Pressure Support:  [5 cmH20] 5 cmH20 Plateau Pressure:  [15 cmH20-18 cmH20] 16 cmH20  Temp:  [98.4 F (36.9 C)-99.7 F (37.6 C)] 98.5 F (36.9 C) (12/05 0843) Pulse Rate:  [104-136] 122 (12/05 0800) Resp:  [15-26] 22 (12/05 0800) BP: (96-156)/(65-111) 143/95 (12/05 0800) SpO2:  [90 %-100 %] 100 % (12/05 0800) FiO2 (%):  [40 %-60 %] 60 % (12/05 0753) Weight:  [119 lb 0.8 oz (54 kg)] 119 lb 0.8 oz (54 kg) (12/05 0500)  General:  Chronically ill appearing female in NAD HEENT: MM pink/moist, no jvd PSY: anxious  Neuro: awake, alert, MAE, generalized weakness  CV: s1s2 rrr, no m/r/g PULM: even/non-labored, diminished bases  BP:ZWCH, non-tender, bsx4 active  Extremities: warm/dry, no edema  Skin: no rashes or lesions  CBC Latest Ref Rng & Units 02/10/2017 01/23/2017 01/22/2017  WBC 4.0 - 10.5 K/uL 11.8(H) 10.0 10.2  Hemoglobin 12.0 - 15.0 g/dL 8.8(L) 8.8(L) 8.1(L)  Hematocrit 36.0 - 46.0 % 27.0(L) 26.8(L) 25.2(L)  Platelets 150 - 400 K/uL 210 222 248   BMP Latest Ref Rng & Units 01/20/2017  01/23/2017 01/22/2017  Glucose 65 - 99 mg/dL 157(H) 118(H) 144(H)  BUN 6 - 20 mg/dL 47(H) 60(H) 74(H)  Creatinine 0.44 - 1.00 mg/dL 1.34(H) 1.45(H) 1.70(H)  Sodium 135 - 145 mmol/L 136 138 141  Potassium 3.5 - 5.1 mmol/L 4.1 3.4(L) 4.3  Chloride 101 - 111 mmol/L 100(L) 102 108  CO2 22 - 32 mmol/L 21(L) 26 26  Calcium 8.9 - 10.3 mg/dL 8.7(L) 8.5(L) 7.9(L)   Hepatic Function Latest Ref Rng & Units 02/13/2017 01/16/2017 01/15/2017  Total Protein 6.5 - 8.1 g/dL - - -  Albumin 3.5 - 5.0 g/dL 2.4(L) 2.2(L) 2.1(L)  AST 15 - 41 U/L - - -  ALT 14 - 54 U/L - - -  Alk Phosphatase 38 - 126 U/L - - -  Total Bilirubin 0.3 - 1.2 mg/dL - - -  Bilirubin, Direct <=0.2 mg/dL - - -    IMAGING/STUDIES: CTA CHEST 11/14 > No PE, diffuse airspace disease, emphysema TTE 11/15:  LV normal in size w/ EF 55-60%. LA moderately dilated & RA moderately dilated. RV mildly dilated with preserved systolic function. Trivial aortic regurgitation. Trivial mitral regurgitation without stenosis. No pulmonic regurgitation. Mild tricuspid regurgitation. No pericardial effusion. CT chest 11/30> Air tracking along the trachea, esophagus.  Worsening interstitial opacities, lung fibrosis CXR 12/2 > no mediastinal air noted, trach is good position.  CT Chest 12/4 >> resolution of previously seen pneumomediastinum, severe COPD, moderate bilateral pleural effusions, extensive bilateral airspace disease, CAD / aortic atherosclerosis  MICROBIOLOGY: MRSA PCR 11/14:  Negative Blood Cultures x2 11/14:  Negative  Tracheal Aspirate Culture 11/16:  Normal Respiratory Flora  Respiratory Panel PCR 11/15:  Negative  Influenza A/B PCR  11/14:  Negative  Urine Streptococcal Antigen 11/14:  Negative  Urine Legionella Antigen 11/14:  Negative  UC 12/1 >> 100k pseudomonas aeruginosa >> S-imipenem, zosyn (but MIC 32) BC 12/1 >>   ANTIBIOTICS: Azithromycin 11/14 - 11/15 Rocephin 11/14 - 11/15 Vancomycin 11/14 - 11/18 Zosyn 11/15 -  11/22 Vanco 12/1 >> 12/3 Zosyn 12/1 >> 12/3 Meropenem 12/3 >>   LINES/TUBES: OETT 11/16 >> 11/30 Trach 11/30 >> R IJ CVL 11/16 >> 11/29 Foley >> replace 11/26 >> OGT >>  SIGNIFICANT EVENTS: 11/14 - Admit 11/16 - Transfer to ICU with worsening respiratory status 11/18 - Changed to pressure control ventilation  11/20 - Didn't tolerate attempt to switch back from Pressure Control to PRVC. Heparin drip stopped w/ anemia.  11/21 - Started Seroquel 65m BID.  11/23 - More calm and awake today. Tolerating PS 5/5 today w/ TV >800cc.  11/25 - Following commands today & more calm. Tolerating PS 5/5 again today. 11/26 - replacing foley, mild abdominal distention 11/28 - upper endoscopy with moderate gastritis  11/30 - Failed extubation. Trach done 12/03 - Hypotensive overnight. Give fluid bolus and lopressor decreased  ASSESSMENT/PLAN:  71y.o.  female with chronic immunosuppression, renal transplant, ILD, afib. Admitted with acute resp failure presumed secondary to HCAP.  She has had a prolonged hospitalization.  Failed extubation and had a trach placed Noted to have mediastinal air after intubation and trach placement.  Discussed with radiologist and we are unable to do a esophagram in a patient who cannot swallow. There is low suspicion for esophageal injury so we will continue to follow clinically > repeat CT negative for mediastinal air.    PULMONARY A: Acute on chronic respiratory failure HCAP, Aspiration pneumonia - concern for aspiration 11/30 with repeat intubation Status post trach Mediastinal air - post trach, resolved on CT Tracheal Site Bleeding  Moderate Bilateral Pleural Effusions  P:   PCV as rest mode  PSV wean as tolerated with goal of ATC Follow intermittent CXR  Trach care per protocol   CARDIOVASCULAR A:  Atrial fibrillation Pulmonary HTN Acute on chronic diastolic congestive heart failure CAD / Aortic Atherosclerosis - seen on CT chest P:  Transition to  Eliquis  Cardizem + Lopressor for rate control  Lopressor reduced 12/1 with hypotension  Continue lipitor   RENAL A:   Renal failure s/p kidney transplant - required CVVHD P:   Continue home Imuran + steroids Free water 300 ml Q4  Trend BMP / urinary output Replace electrolytes as indicated Avoid nephrotoxic agents, ensure adequate renal perfusion Appreciate Nephrology   GASTROINTESTINAL A:   Melena, GIB Gastritis on EGD P:   PPI BID  Sucralfate Consider EGD if further drop in Hgb (stable) TF per Nutrition  Family wants to hold off on PEG for now > hopeful for weaning off vent / SLP efforts.  They feel she would not do well with a PEG (in regards to acceptance)  HEMATOLOGIC A:   Leukocytosis Anemia P:  Trend CBC  Monitor for bleeding > trach / GI  Heparin gtt per pharmacy, appreciate assistance   INFECTIOUS A:   HCAP, aspiration PNA Pseudomonal UTI - will treat as active infection given immune suppression & 100K growth  P:   Continue meropenem, plan for 10 days total, stop date in place (started 12/1) Trend PCT  Monitor fever curve / WBC trend  ENDOCRINE A:   Adrenal insufficieny, on chronic steroids  P:   Continue home prednisone  Lantus + SSI   NEUROLOGIC A:   Anxiety Encephalopathy - resolving  Restless Leg P:   Fentanyl PRN  Family reports she gets agitated from fentanyl > discontinue patch  Begin Requip BID  Continue seroquel, prozac Start valium 2 mg BID for anxiety   FAMILY  - Updates: Daughter updated at bedside am 12/5  - Global:  Plan for d/c to LTAC when bed available.   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  Patient failed weaning this AM and has a high WOB on exam.  I reviewed CXR myself, trach is in good  position.  Will continue merrem for now then will need bactrim after 10 day course is complete on 12/10.  Will continue steroids for now.  Continue weaning efforts.  Family does not wish for PEG placement.  Will continue aggressive treatment at this time.  Awaiting placement to an LTAC not SNF given respiratory failure.  The patient is critically ill with multiple organ systems failure and requires high complexity decision making for assessment and support, frequent evaluation and titration of therapies, application of advanced monitoring technologies and extensive interpretation of multiple databases.   Critical Care Time devoted to patient care services described in this note is  35  Minutes. This time reflects time of care of this signee Dr Jennet Maduro. This critical care time does not reflect procedure time, or teaching time or supervisory time of PA/NP/Med student/Med Resident etc but could involve care discussion time.  Rush Farmer, M.D. [facility] Pulmonary/Critical Care Medicine. Pager: [phone]. After hours pager: [phone].

## 2017-01-24 NOTE — Addendum Note (Signed)
Addended by: Desmond Dike C on: 01/28/2017 04:45 PM   Modules accepted: Orders

## 2017-01-24 NOTE — Progress Notes (Signed)
71 y.o.year-old  hx of ESRD sp renal transplant (LRD, 1984, pred/ imuran) Presented 11/14 w/SOB/ DOE and CP. Recent new AF on BB and Eliquis. Hx of dCHF. Hypoxic on adm. BNP  up, initial Rx IV diltiazem for afib /RVR,not felt to be vol overloaded. CT angio w/ contrast no PE but RUL air space disease, pulm edema vs infection. Lactate 1.45, WBC 9k. Admitted, rx'd with IV diltiazem, po metoprolol, IV abx (rocephin, vanc/ zosyn, now meropenam) for PNA. Intubated 11/15. Admit creat 0.8, AKI due to above- now trending down- no dialytic intervention required    Impression: 1. AKI -creatinineslow improvement- 1.34 today 2. Renal transplantwith dysfunction- Creat normal 0.8. Peak creatinine 4.3.Imuran and prednisone. Dr Justin Mend at [facility]. On home doses 3. Hypernatremia, resolved- have dec free water 4. VDRF - s/p emergency tracheostomy after extubation 11/30 cx by mediastinal air 5. Multifocal PNA / hx pulm fibrosis and COPD - slow recovery - meropenam 6. AF with RVR,recurrent- now rate in the low 100's - dilt and metoprolol 7. dCHF, great UOP- - no diuretics for now, is autodiuresing- given 20 of lasix times one yest by someone 8. Anemia -transfused, Endo donew/ diffuse gastritis ? - - also on heparin ??- hgb 8.8 yest, stable- will add ESA 9 Hypokalemia- resolved after repletion- gave a little yest- 4.1 today   Subjective: Interval History: No hypotension- 2500 of UOP- given 20 of lasix yest ?   Objective: Vital signs in last 24 hours: Temp:  [98.4 F (36.9 C)-99.7 F (37.6 C)] 99.3 F (37.4 C) (12/05 0345) Pulse Rate:  [104-136] 120 (12/05 0700) Resp:  [15-26] 19 (12/05 0700) BP: (96-156)/(65-111) 135/91 (12/05 0700) SpO2:  [90 %-98 %] 96 % (12/05 0700) FiO2 (%):  [30 %-40 %] 40 % (12/05 0700) Weight:  [54 kg (119 lb 0.8 oz)] 54 kg (119 lb 0.8 oz) (12/05 0500) Weight change: -1.8 kg (-15.5 oz)  Intake/Output from previous day: 12/04 0701 - 12/05 0700 In: 2087.6 [I.V.:242.6;  TO/IZ:1245; IV Piggyback:200] Out: 3020 [Urine:2595; Stool:425] Intake/Output this shift: No intake/output data recorded.  Head: Normocephalic, without obvious abnormality, atraumatic Throat: trach Chest wall: no tenderness Extremities: edema trace to 1+ at knees  Awake- agitated this AM  Lab Results: Recent Labs    01/23/17 0321 01/28/2017 0723  WBC 10.0 11.8*  HGB 8.8* 8.8*  HCT 26.8* 27.0*  PLT 222 210   BMET:  Recent Labs    01/23/17 0321 01/28/2017 0723  NA 138 136  K 3.4* 4.1  CL 102 100*  CO2 26 21*  GLUCOSE 118* 157*  BUN 60* 47*  CREATININE 1.45* 1.34*  CALCIUM 8.5* 8.7*   No results for input(s): PTH in the last 72 hours. Iron Studies: No results for input(s): IRON, TIBC, TRANSFERRIN, FERRITIN in the last 72 hours. Studies/Results: Ct Chest Wo Contrast  Result Date: 01/23/2017 CLINICAL DATA:  Prior imaging concerning for mediastinal air. EXAM: CT CHEST WITHOUT CONTRAST TECHNIQUE: Multidetector CT imaging of the chest was performed following the standard protocol without IV contrast. COMPARISON:  Chest x-ray 01/22/2017.  Chest CT 01/19/2017. FINDINGS: Cardiovascular: Diffuse aortic and coronary artery calcifications. Cardiomegaly. No evidence of aortic aneurysm. Mediastinum/Nodes: Tracheostomy remains in place. Previously seen mediastinal air has resolved. Tracheostomy tip in the mid to lower trachea. Borderline size scattered mediastinal lymph nodes, stable. No axillary adenopathy. Lungs/Pleura: Emphysema. Moderate bilateral pleural effusions and severe bilateral airspace disease, most pronounced in the mid and lower lungs, stable. Upper Abdomen: Previously seen tubular mixed attenuation area in the  left upper quadrant not completely imaged on today's chest CT. There is area anterior and medial to the spleen which measures up to 4.9 cm, likely a component of this previously seen tubular fluid collection, stable. Musculoskeletal: Chest wall soft tissues are unremarkable.  No acute bony abnormality. IMPRESSION: Resolution of the previously seen pneumomediastinum. Severe COPD. Moderate bilateral pleural effusions with extensive bilateral airspace disease, most pronounced in the mid and lower lung zones, stable. Tracheostomy tube in the mid to lower trachea. Extensive coronary artery disease, aortic atherosclerosis. Electronically Signed   By: Rolm Baptise M.D.   On: 01/23/2017 08:37   Dg Chest Port 1 View  Result Date: 02/07/2017 CLINICAL DATA:  Acute respiratory failure with hypoxia EXAM: PORTABLE CHEST 1 VIEW COMPARISON:  05/29/2010 FINDINGS: Cardiac shadow is stable. Tracheostomy tube and feeding catheter are again noted in stable position. Diffuse underlying fibrotic changes are again identified and stable. Some slight increased density is noted in the right lung base which may be related some acute on chronic infiltrate. No pneumothorax is seen. No bony abnormality is noted. IMPRESSION: Slight increase in right basilar density when compared with the prior exam. This may represent some acute on chronic infiltrate. Electronically Signed   By: Inez Catalina M.D.   On: 02/19/2017 07:11   Dg Chest Port 1 View  Result Date: 01/23/2017 CLINICAL DATA:  Hypoxia EXAM: PORTABLE CHEST 1 VIEW COMPARISON:  Chest radiograph January 22, 2017 and chest CT January 23, 2017 FINDINGS: Tracheostomy catheter tip is 4.5 cm above the carina. Enteric tube tip is below the diaphragm. No pneumothorax. There are small pleural effusions bilaterally. There is widespread fibrotic change bilaterally with patchy consolidation in the lung bases, stable. Heart is mildly enlarged with pulmonary vascularity within normal limits. No adenopathy evident. There is aortic atherosclerosis. No appreciable bone lesions. IMPRESSION: Tube positions as described without pneumothorax. Underlying pulmonary fibrosis. Small pleural effusions bilaterally with patchy airspace opacity in the bases. A degree of bibasilar  pneumonia must be of concern. There is aortic atherosclerosis. Aortic Atherosclerosis (ICD10-I70.0). Electronically Signed   By: Lowella Grip III M.D.   On: 01/23/2017 07:46    Scheduled: . atorvastatin  20 mg Per Tube Daily  . azaTHIOprine  50 mg Oral Daily  . chlorhexidine  15 mL Mouth Rinse BID  . cycloSPORINE  1 drop Both Eyes BID  . diltiazem  30 mg Per Tube Q6H  . feeding supplement (PRO-STAT SUGAR FREE 64)  30 mL Per Tube BID  . fentaNYL  50 mcg Transdermal Q72H  . FLUoxetine  80 mg Per Tube Daily  . free water  300 mL Per Tube Q8H  . insulin aspart  0-15 Units Subcutaneous Q4H  . insulin glargine  10 Units Subcutaneous QHS  . mouth rinse  15 mL Mouth Rinse q12n4p  . metoprolol tartrate  50 mg Per Tube BID  . pantoprazole sodium  40 mg Per Tube BID  . predniSONE  7.5 mg Per Tube Q breakfast  . QUEtiapine  100 mg Per Tube QHS  . sucralfate  1 g Per Tube BID    LOS: 21 days   Andrews Tener A 02/08/2017,7:44 AM

## 2017-01-24 NOTE — Progress Notes (Addendum)
[place] for heparin Indication: atrial fibrillation  Allergies  Allergen Reactions  . Amoxicillin Diarrhea    Has patient had a PCN reaction causing immediate rash, facial/tongue/throat swelling, SOB or lightheadedness with hypotension: No Has patient had a PCN reaction causing severe rash involving mucus membranes or skin necrosis: No Has patient had a PCN reaction that required hospitalization No Has patient had a PCN reaction occurring within the last 10 years: Yes If all of the above answers are "NO", then may proceed with Cephalosporin use.   . Tape Rash    Patient Measurements: Height: 5\' 5"  (165.1 cm) Weight: 119 lb 0.8 oz (54 kg) IBW/kg (Calculated) : 57 Heparin Dosing Weight: 54.2 kg  Vital Signs: Temp: 99.3 F (37.4 C) (12/05 0345) Temp Source: Oral (12/05 0345) BP: 143/95 (12/05 0800) Pulse Rate: 122 (12/05 0800)  Labs: Recent Labs    01/22/17 0344 01/23/17 0321 01/30/2017 0723  HGB 8.1* 8.8* 8.8*  HCT 25.2* 26.8* 27.0*  PLT 248 222 210  HEPARINUNFRC 0.64 0.44 0.17*  CREATININE 1.70* 1.45* 1.34*    Assessment: Pt is on Eliquis pta for AFib. Last dose was on 11/13. All anticoagulation has been held during admission due to melena (now resolved). The pt had an EGD which found gastritis as likely cause of melena. Remains on ppi bid.  Trach site was bleeding but now resolved.   Currently back on IV heparin at 1100 units/h - hep lvl now low at 0.17  CBC stable  Goal of Therapy:  Heparin level 0.3-0.7 units/ml Monitor platelets by anticoagulation protocol: Yes  Plan:  Convert to apixaban 5 mg bid  Levester Fresh, PharmD, BCPS, BCCCP Clinical Pharmacist Clinical phone for 02/13/2017 from 7a-3:30p: [phone] If after 3:30p, please call main pharmacy at: [phone] 01/21/2017 8:28 AM

## 2017-01-24 NOTE — Discharge Summary (Signed)
Physician Discharge Summary  Patient ID: Morgan Cooper MRN: [id number] DOB/AGE: September 27, 1945 71 y.o.  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 [facility]   Discharge Plan: Continue PCV as rest mode  Daily PSV weaning  Follow intermittent CXR  Trach care per protocol with inner cannula change  Ok to clip trach sutures on 01/29/17 Mobilize  Atrial Fibrillation  Pulmonary HTN Acute on Chronic Diastolic CHF CAD / Aortic Atherosclerosis - seen on CT chest  Discharge Plan: Transitioned back to Eliquis 12/5  Continue cardizem, lopressor for rate control  Lopressor was reduced 12/1 with hypotension  Continue lipitor   AKI s/p CVVHD  Renal Transplant - sister was living donor transplant ~ 25 years ago  Discharge Plan: Continue home Imuran, steroids  Free water reduced to 200 ml Q8 (sodium lower limit normal) Trend BMP / urinary output Replace electrolytes as indicated Avoid nephrotoxic agents, ensure adequate renal perfusion  Melena / GIB  Gastritis  on EGD   Anemia  Moderate Protein Calorie Malnutrition   Discharge Plan:  No further bleeding.  Continue carafate for gastritis for 1 week, then discontinue. Continue TF, Nepro at 25 ml/hr PPI BID  TF per Nutrition  Family wants to hold off on PEG for now > hopeful for weaning off vent / SLP efforts.  They feel she would not do well with a PEG (in regards to acceptance)   Pseudomonas UTI   Discharge Plan: Meropenem, complete 10 days total (stop date 12/10) Once Meropenem completed, resume trimethoprim for prophylaxis   Adrenal Insufficiency / Chronic Steroids   Discharge Plan: Continue baseline prednisone SSI + Lantus for hyperglycemia   Anxiety / Depression  Metabolic Encephalopathy  Restless Leg   Discharge Plan: Valium added 12/5  Pt has history of agitation / confusion with fentanyl  Requip initiated 12/5 for RLS Continue prozac                     DISCHARGE SUMMARY   71 y.o. female, former smoker, who presented to [facility] via EMS on 01/03/17 with shortness of breath, dull chest pain and lightheadedness.   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  Prior to admit, she had been diagnosed with atrial fibrillation and was wearing a Holter monitor.  The patient was found to have acute hypoxic respiratory failure in the setting of HCAP and AF with RVR.  CTA of the chest ruled out PE but confirmed a RUL pneumonia.  The patient was initially admitted per [facility].  She decompensated overnight 12/15 and PCCM was consulted for evaluation of respiratory failure.  The patient ultimately required intubation.  ECHO demonstrated an LVEF 55-60%.  Pan cultures assessed and negative.  Viral panel negative as well.  She was treated with IV antibiotics for RUL PNA.  The  patient was treated with IV heparin in the setting of AFwRVR.  She developed septic shock requiring vasopressors and AKI.  She was evaluated by Nephrology and required CVVHD.  Course was complicated by GIB.  She was seen by GI and underwent an EGD on 11/28 which confirmed gastritis.  The patient was treated with PPI and carafate.  She made slow progress in regards to weaning.  She was extubated on 11/30 but failed and required tracheostomy placement.  Post placement she had bleeding from trach site.  Heparin gtt was briefly held and restarted with no further bleeding.  She was transitioned to Eliquis.  She has been able to tolerate out of bed to chair and PSV 10/5 weaning.  The patient was medically cleared for discharge 12/5 with plans as above.     IMAGING/STUDIES: CTA CHEST 11/14 >> No PE, diffuse airspace disease, emphysema TTE 11/15 >> LV normal in size w/ EF 55-60%. LA moderately dilated & RA moderately dilated. RV mildly dilated with preserved systolic function. Trivial aortic regurgitation. Trivial mitral regurgitation without stenosis. No pulmonic regurgitation. Mild tricuspid regurgitation. No pericardial effusion. CT chest 11/30 >> Air tracking along the trachea, esophagus.  Worsening interstitial opacities, lung fibrosis CXR 12/2 > no mediastinal air noted, trach is good position.  CT Chest 12/4 >> resolution of previously seen pneumomediastinum, severe COPD, moderate bilateral pleural effusions, extensive bilateral airspace disease, CAD / aortic atherosclerosis  MICROBIOLOGY: MRSA PCR 11/14: Negative Blood Cultures x2 11/14: Negative Tracheal Aspirate Culture 11/16: Normal Respiratory Flora  Respiratory Panel PCR 11/15: Negative  Influenza A/B PCR 11/14: Negative  Urine Streptococcal Antigen 11/14: Negative  Urine Legionella Antigen 11/14: Negative  UC 12/1 >> 100k pseudomonas aeruginosa >> S-imipenem, zosyn (but MIC 32) BC 12/1 >>   ANTIBIOTICS: Azithromycin 11/14 -  11/15 Rocephin 11/14 - 11/15 Vancomycin 11/14 - 11/18 Zosyn 11/15 - 11/22 Vanco 12/1 >> 12/3 Zosyn 12/1 >> 12/3 Meropenem 12/3 >>   LINES/TUBES: OETT 11/16 >> 11/30 Trach 11/30 >> R IJ CVL 11/16 >> 11/29 Foley >> replace 11/26 >> OGT >>  SIGNIFICANT EVENTS: 11/14 - Admit 11/16 - Transfer to ICU with worsening respiratory status 11/18 - Changed to pressure control ventilation  11/20 - Didn't tolerate attempt to switch back from Pressure Control to PRVC. Heparin drip stopped w/ anemia.  11/21 - Started Seroquel 54m BID.  11/23 - More calm and awake today. Tolerating PS 5/5 today w/ TV >800cc.  11/25 - Following commands today & more calm. Tolerating PS 5/5 again today. 11/26 - replacing foley, mild abdominal distention 11/28 - upper endoscopy with moderate gastritis  11/30 - Failed extubation. Trach done 12/03 - Hypotensive overnight. Give fluid bolus and lopressor decreased   Discharge  Exam: General:  Chronically ill appearing female in NAD HEENT: MM pink/moist, no jvd PSY: anxious  Neuro: awake, alert, MAE, generalized weakness  CV: s1s2 rrr, no m/r/g PULM: even/non-labored, diminished bases  YP:PJKD, non-tender, bsx4 active  Extremities: warm/dry, no edema  Skin: no rashes or lesions   Vitals:   01/26/2017 1000 01/22/2017 1100 02/03/2017 1105 01/23/2017 1219  BP: 127/81 97/71    Pulse: (!) 129 (!) 113 (!) 114   Resp: (!) 26 (!) 23 (!) 26   Temp:    100.3 F (37.9 C)  TempSrc:    Axillary  SpO2: 90% 95%    Weight:      Height:         Discharge Labs  BMET Recent Labs  Lab 01/19/17 0836 01/20/17 0854 01/21/17 0524 01/22/17 0344 01/23/17 0321 02/07/2017 0723  NA 140 142 141 141 138 136  K 4.1 4.6 3.4* 4.3 3.4* 4.1  CL 104 104 105 108 102 100*  CO2 25 24 26 26 26  21*  GLUCOSE 159* 151* 155* 144* 118* 157*  BUN 85* 76* 84* 74* 60* 47*  CREATININE 2.13* 2.14* 1.98* 1.70* 1.45* 1.34*  CALCIUM 8.5* 9.2 8.2* 7.9* 8.5* 8.7*  MG 1.9 2.2 1.9 2.3  --   --    PHOS  --  7.2* 5.7* 3.7  --  2.3*    CBC Recent Labs  Lab 01/22/17 0344 01/23/17 0321 01/21/2017 0723  HGB 8.1* 8.8* 8.8*  HCT 25.2* 26.8* 27.0*  WBC 10.2 10.0 11.8*  PLT 248 222 210    Anti-Coagulation Recent Labs  Lab 01/20/17 0854  INR 1.10    Discharge Instructions    Call MD for:  difficulty breathing, headache or visual disturbances   Complete by:  As directed    Call MD for:  extreme fatigue   Complete by:  As directed    Call MD for:  hives   Complete by:  As directed    Call MD for:  persistant dizziness or light-headedness   Complete by:  As directed    Call MD for:  persistant nausea and vomiting   Complete by:  As directed    Call MD for:  redness, tenderness, or signs of infection (pain, swelling, redness, odor or green/yellow discharge around incision site)   Complete by:  As directed    Call MD for:  severe uncontrolled pain   Complete by:  As directed    Call MD for:  temperature >100.4   Complete by:  As directed    Discharge instructions   Complete by:  As directed    Leave all lines / tubes in place for discharge   Increase activity slowly   Complete by:  As directed       Follow-up Information    [name], MD Follow up.   Specialty:  Pulmonary Disease Why:  Call for an appointment once discharged from [facility].  Contact information: [address] [place] [address] [phone]          Allergies as of 02/12/2017      Reactions   Amoxicillin Diarrhea   Has patient had a PCN reaction causing immediate rash, facial/tongue/throat swelling, SOB or lightheadedness with hypotension: No Has patient had a PCN reaction causing severe rash involving mucus membranes or skin necrosis: No Has patient had a PCN reaction that required hospitalization No Has patient had a PCN reaction occurring within the last 10 years: Yes If all of the above  answers are "NO", then may proceed with Cephalosporin use.   Tape Rash      Medication  List    STOP taking these medications   amLODipine 5 MG tablet Commonly known as:  NORVASC   omeprazole 40 MG capsule Commonly known as:  PRILOSEC   ondansetron 4 MG tablet Commonly known as:  ZOFRAN     TAKE these medications   apixaban 5 MG Tabs tablet Commonly known as:  ELIQUIS Take 1 tablet (5 mg total) 2 (two) times daily by mouth.   atorvastatin 20 MG tablet Commonly known as:  LIPITOR Take 20 mg by mouth daily.   azaTHIOprine 50 MG tablet Commonly known as:  IMURAN Take 50 mg by mouth daily.   chlorhexidine 0.12 % solution Commonly known as:  PERIDEX 15 mLs by Mouth Rinse route 2 (two) times daily.   cycloSPORINE 0.05 % ophthalmic emulsion Commonly known as:  RESTASIS Place 1 drop into both eyes 2 (two) times daily.   diazepam 2 MG tablet Commonly known as:  VALIUM Take 1 tablet (2 mg total) by mouth 2 (two) times daily.   diltiazem 10 mg/ml oral suspension Commonly known as:  CARDIZEM Place 3 mLs (30 mg total) into feeding tube every 6 (six) hours.   feeding supplement (NEPRO CARB STEADY) Liqd Place 1,000 mLs into feeding tube continuous. Rate 25 ml/hr   feeding supplement (PRO-STAT SUGAR FREE 64) Liqd Place 30 mLs into feeding tube 2 (two) times daily.   FLUoxetine 20 MG capsule Commonly known as:  PROZAC Take 80 mg by mouth daily.   free water Soln Place 200 mLs into feeding tube every 8 (eight) hours.   HYDROcodone-acetaminophen 10-325 MG tablet Commonly known as:  NORCO Take 1 tablet by mouth every 6 (six) hours as needed (pain).   insulin aspart 100 UNIT/ML injection Commonly known as:  novoLOG Inject 0-15 Units into the skin every 4 (four) hours.   insulin glargine 100 UNIT/ML injection Commonly known as:  LANTUS Inject 0.1 mLs (10 Units total) into the skin at bedtime.   levalbuterol 0.63 MG/3ML nebulizer solution Commonly known as:  XOPENEX Take 3 mLs (0.63 mg total) by nebulization every 4 (four) hours as needed for wheezing or  shortness of breath.   meropenem 1 g in sodium chloride 0.9 % 100 mL Inject 1 g into the vein every 12 (twelve) hours for 5 days.   metoprolol tartrate 50 MG tablet Commonly known as:  LOPRESSOR Take 1 tablet (50 mg total) by mouth 2 (two) times daily. What changed:  how much to take   metoprolol tartrate 5 MG/5ML Soln injection Commonly known as:  LOPRESSOR Inject 5 mLs (5 mg total) into the vein every 4 (four) hours as needed (HR >120). What changed:  You were already taking a medication with the same name, and this prescription was added. Make sure you understand how and when to take each.   mouth rinse Liqd solution 15 mLs by Mouth Rinse route 2 times daily at 12 noon and 4 pm.   OXYGEN Inhale 1 L into the lungs See admin instructions. Use at bedtime and as needed during activity   pantoprazole sodium 40 mg/20 mL Pack Commonly known as:  PROTONIX Place 20 mLs (40 mg total) into feeding tube 2 (two) times daily.   predniSONE 5 MG tablet Commonly known as:  DELTASONE Take 7.5 mg by mouth daily with breakfast.   QUEtiapine 200 MG tablet Commonly known as:  SEROQUEL Take 0.5  tablets (100 mg total) by mouth at bedtime. What changed:  how much to take   rOPINIRole 0.25 MG tablet Commonly known as:  REQUIP Take 1 tablet (0.25 mg total) by mouth 2 (two) times daily.   sucralfate 1 GM/10ML suspension Commonly known as:  CARAFATE Place 10 mLs (1 g total) into feeding tube 2 (two) times daily.   trimethoprim 100 MG tablet Commonly known as:  TRIMPEX Take 1 tablet (100 mg total) by mouth at bedtime. continuous Start taking on:  01/30/2017 What changed:  These instructions start on 01/30/2017. If you are unsure what to do until then, ask your doctor or other care provider.         Disposition:  [place]   Discharged Condition: Morgan Cooper has met maximum benefit of inpatient care and is medically stable and cleared for discharge.  Patient is  pending follow up as above.      Time spent on disposition:  35 Minutes.   Signed: Noe Gens, NP-C [facility] Pgr: [phone] Office: [phone]  Attending Note:  71 year old female with ILD that is being [facility].  >35 minutes spent on discharge related activities.  The patient is critically ill with multiple organ systems failure and requires high complexity decision making for assessment and support, frequent evaluation and titration of therapies, application of advanced monitoring technologies and extensive interpretation of multiple databases.   Critical Care Time devoted to patient care services described in this note is  35  Minutes. This time reflects time of care of this signee Dr Jennet Maduro. This critical care time does not reflect procedure time, or teaching time or supervisory time of PA/NP/Med student/Med Resident etc but could involve care discussion time.  Rush Farmer, M.D. [facility] Pulmonary/Critical Care Medicine. Pager: [phone]. After hours pager: [phone].

## 2017-01-25 LAB — CBC WITH DIFFERENTIAL/PLATELET
BASOS PCT: 0 %
Basophils Absolute: 0 10*3/uL (ref 0.0–0.1)
EOS ABS: 0.4 10*3/uL (ref 0.0–0.7)
EOS PCT: 4 %
HCT: 26.1 % — ABNORMAL LOW (ref 36.0–46.0)
Hemoglobin: 8.4 g/dL — ABNORMAL LOW (ref 12.0–15.0)
LYMPHS ABS: 0.4 10*3/uL — AB (ref 0.7–4.0)
Lymphocytes Relative: 4 %
MCH: 29.8 pg (ref 26.0–34.0)
MCHC: 32.2 g/dL (ref 30.0–36.0)
MCV: 92.6 fL (ref 78.0–100.0)
MONO ABS: 1.1 10*3/uL — AB (ref 0.1–1.0)
MONOS PCT: 10 %
NEUTROS PCT: 82 %
Neutro Abs: 8.8 10*3/uL — ABNORMAL HIGH (ref 1.7–7.7)
PLATELETS: 180 10*3/uL (ref 150–400)
RBC: 2.82 MIL/uL — ABNORMAL LOW (ref 3.87–5.11)
RDW: 15 % (ref 11.5–15.5)
WBC: 10.7 10*3/uL — ABNORMAL HIGH (ref 4.0–10.5)

## 2017-01-25 LAB — CULTURE, BLOOD (ROUTINE X 2)
Culture: NO GROWTH
Culture: NO GROWTH
SPECIAL REQUESTS: ADEQUATE
Special Requests: ADEQUATE

## 2017-01-25 LAB — BASIC METABOLIC PANEL
Anion gap: 7 (ref 5–15)
BUN: 38 mg/dL — AB (ref 6–20)
CALCIUM: 8.7 mg/dL — AB (ref 8.9–10.3)
CO2: 27 mmol/L (ref 22–32)
CREATININE: 1.27 mg/dL — AB (ref 0.44–1.00)
Chloride: 102 mmol/L (ref 101–111)
GFR calc non Af Amer: 41 mL/min — ABNORMAL LOW (ref >60)
GFR, EST AFRICAN AMERICAN: 48 mL/min — AB (ref >60)
GLUCOSE: 140 mg/dL — AB (ref 65–99)
Potassium: 3.2 mmol/L — ABNORMAL LOW (ref 3.5–5.1)
Sodium: 136 mmol/L (ref 135–145)

## 2017-01-26 LAB — BASIC METABOLIC PANEL WITH GFR
Anion gap: 9 (ref 5–15)
BUN: 35 mg/dL — ABNORMAL HIGH (ref 6–20)
CO2: 25 mmol/L (ref 22–32)
Calcium: 8.8 mg/dL — ABNORMAL LOW (ref 8.9–10.3)
Chloride: 101 mmol/L (ref 101–111)
Creatinine, Ser: 1.18 mg/dL — ABNORMAL HIGH (ref 0.44–1.00)
GFR calc Af Amer: 52 mL/min — ABNORMAL LOW
GFR calc non Af Amer: 45 mL/min — ABNORMAL LOW
Glucose, Bld: 149 mg/dL — ABNORMAL HIGH (ref 65–99)
Potassium: 3.7 mmol/L (ref 3.5–5.1)
Sodium: 135 mmol/L (ref 135–145)

## 2017-01-26 LAB — C DIFFICILE QUICK SCREEN W PCR REFLEX
C Diff antigen: NEGATIVE
C Diff interpretation: NOT DETECTED
C Diff toxin: NEGATIVE

## 2017-01-29 ENCOUNTER — Other Ambulatory Visit: Payer: Medicare Other

## 2017-01-29 ENCOUNTER — Ambulatory Visit: Payer: Medicare Other

## 2017-01-29 LAB — CBC
HCT: 23.8 % — ABNORMAL LOW (ref 36.0–46.0)
Hemoglobin: 7.8 g/dL — ABNORMAL LOW (ref 12.0–15.0)
MCH: 30.4 pg (ref 26.0–34.0)
MCHC: 32.8 g/dL (ref 30.0–36.0)
MCV: 92.6 fL (ref 78.0–100.0)
PLATELETS: 140 10*3/uL — AB (ref 150–400)
RBC: 2.57 MIL/uL — AB (ref 3.87–5.11)
RDW: 15.8 % — AB (ref 11.5–15.5)
WBC: 9.8 10*3/uL (ref 4.0–10.5)

## 2017-01-29 LAB — BASIC METABOLIC PANEL
Anion gap: 12 (ref 5–15)
BUN: 47 mg/dL — AB (ref 6–20)
CO2: 18 mmol/L — ABNORMAL LOW (ref 22–32)
CREATININE: 1.02 mg/dL — AB (ref 0.44–1.00)
Calcium: 8.4 mg/dL — ABNORMAL LOW (ref 8.9–10.3)
Chloride: 104 mmol/L (ref 101–111)
GFR calc Af Amer: >60 mL/min (ref >60)
GFR, EST NON AFRICAN AMERICAN: 54 mL/min — AB (ref >60)
GLUCOSE: 136 mg/dL — AB (ref 65–99)
Potassium: 5.3 mmol/L — ABNORMAL HIGH (ref 3.5–5.1)
SODIUM: 134 mmol/L — AB (ref 135–145)

## 2017-01-29 LAB — POTASSIUM: POTASSIUM: 3.3 mmol/L — AB (ref 3.5–5.1)

## 2017-01-30 LAB — POTASSIUM: POTASSIUM: 2.9 mmol/L — AB (ref 3.5–5.1)

## 2017-01-31 LAB — CBC
HCT: 22.1 % — ABNORMAL LOW (ref 36.0–46.0)
Hemoglobin: 7.1 g/dL — ABNORMAL LOW (ref 12.0–15.0)
MCH: 30.1 pg (ref 26.0–34.0)
MCHC: 32.1 g/dL (ref 30.0–36.0)
MCV: 93.6 fL (ref 78.0–100.0)
PLATELETS: 188 10*3/uL (ref 150–400)
RBC: 2.36 MIL/uL — ABNORMAL LOW (ref 3.87–5.11)
RDW: 15.9 % — AB (ref 11.5–15.5)
WBC: 7.8 10*3/uL (ref 4.0–10.5)

## 2017-01-31 LAB — BASIC METABOLIC PANEL
Anion gap: 8 (ref 5–15)
BUN: 37 mg/dL — AB (ref 6–20)
CO2: 29 mmol/L (ref 22–32)
CREATININE: 0.95 mg/dL (ref 0.44–1.00)
Calcium: 8.8 mg/dL — ABNORMAL LOW (ref 8.9–10.3)
Chloride: 100 mmol/L — ABNORMAL LOW (ref 101–111)
GFR calc Af Amer: >60 mL/min (ref >60)
GFR, EST NON AFRICAN AMERICAN: 58 mL/min — AB (ref >60)
Glucose, Bld: 163 mg/dL — ABNORMAL HIGH (ref 65–99)
Potassium: 4 mmol/L (ref 3.5–5.1)
SODIUM: 137 mmol/L (ref 135–145)

## 2017-01-31 LAB — PHOSPHORUS: Phosphorus: 1.8 mg/dL — ABNORMAL LOW (ref 2.5–4.6)

## 2017-01-31 LAB — MAGNESIUM: MAGNESIUM: 2 mg/dL (ref 1.7–2.4)

## 2017-02-01 LAB — CBC
HEMATOCRIT: 23.4 % — AB (ref 36.0–46.0)
HEMOGLOBIN: 7.5 g/dL — AB (ref 12.0–15.0)
MCH: 29.6 pg (ref 26.0–34.0)
MCHC: 32.1 g/dL (ref 30.0–36.0)
MCV: 92.5 fL (ref 78.0–100.0)
Platelets: 248 10*3/uL (ref 150–400)
RBC: 2.53 MIL/uL — AB (ref 3.87–5.11)
RDW: 15.6 % — ABNORMAL HIGH (ref 11.5–15.5)
WBC: 8.3 10*3/uL (ref 4.0–10.5)

## 2017-02-01 LAB — BASIC METABOLIC PANEL
ANION GAP: 7 (ref 5–15)
BUN: 41 mg/dL — AB (ref 6–20)
CHLORIDE: 100 mmol/L — AB (ref 101–111)
CO2: 28 mmol/L (ref 22–32)
Calcium: 8.9 mg/dL (ref 8.9–10.3)
Creatinine, Ser: 1.05 mg/dL — ABNORMAL HIGH (ref 0.44–1.00)
GFR calc Af Amer: 60 mL/min — ABNORMAL LOW (ref >60)
GFR, EST NON AFRICAN AMERICAN: 52 mL/min — AB (ref >60)
GLUCOSE: 174 mg/dL — AB (ref 65–99)
POTASSIUM: 4.2 mmol/L (ref 3.5–5.1)
Sodium: 135 mmol/L (ref 135–145)

## 2017-02-02 ENCOUNTER — Other Ambulatory Visit (HOSPITAL_COMMUNITY): Payer: Medicare Other

## 2017-02-02 DIAGNOSIS — I10 Essential (primary) hypertension: Secondary | ICD-10-CM

## 2017-02-02 DIAGNOSIS — E785 Hyperlipidemia, unspecified: Secondary | ICD-10-CM

## 2017-02-02 DIAGNOSIS — I509 Heart failure, unspecified: Secondary | ICD-10-CM | POA: Diagnosis not present

## 2017-02-02 DIAGNOSIS — Z8673 Personal history of transient ischemic attack (TIA), and cerebral infarction without residual deficits: Secondary | ICD-10-CM

## 2017-02-02 DIAGNOSIS — I4891 Unspecified atrial fibrillation: Secondary | ICD-10-CM

## 2017-02-02 NOTE — Consult Note (Signed)
Cardiology Consultation:   Patient ID: Morgan Cooper; [id number]; 03/31/1945   Admit date: 02/03/2017 Date of Consult: 02/02/2017  Primary Care Provider: Leeroy Cha, MD Primary Cardiologist: Larae Grooms, MD    Patient Profile:   Morgan Cooper is a 71 y.o. 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 Jeb Levering.   Most recently, she called the office complaining of increased HR and palpitations. Dr. Irish Lack ordered a 30 day monitor which revealed new atrial fibrillation. 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   Admitted 01/03/17-01/31/2017 for acute hypoxic respiratory failure, in the setting of PNA and afib w/ RVR. Hospital course complicated by prolonged intubation/mechanical ventilation with encephalopathy/delirium. S/p Trachostomy. She remained in a-fib with RVR despite IV cardizem and BB-> rate primarily driven by VDRF, anemia and hypotension.   History of Present Illness:   Morgan Cooper was admitted to [facility] 01/25/17. She remained on ventilator support with pressor assist. Patient had many episode of anxiety attack. In regards to her atrial fibrillation, Her rate was well controlled to 100s on Cardizem 60mg  q 6 hours and metoprolol 50mg  TID. However yesterday afternoon her rate increased to 140s--> given NS 500cc. Due to persistent elevated rate she is started on IV cardizem with improved rate to 120s.   Past Medical History:  Diagnosis Date  . Anxiety   . Arthritis    hips, legs, hand   . Barrett's esophagus   . Cancer ([place])    skin cancer that has been removed- L hand  . CHF (congestive  heart failure) ([place])   . Depression   . Diverticulitis of large intestine with perforation Hx colectomy  . Esophageal stricture   . Gastroparesis   . GERD (gastroesophageal reflux disease)   . Heart failure, diastolic, chronic ([place])   . History of hiatal hernia   . Hx of cardiovascular stress test    Lexiscan Myoview (06/2013):  No ischemia, EF 72%; Low Risk  . Hyperlipemia   . Hypertension   . Keratosis   . Macular degeneration    left eye  . Osteoporosis   . Pancreatic cyst   . Pancreatic mass   . Panic attacks   . Pulmonary hypertension ([place])   . Renal failure    transplant - 1983,  followed by Dr. Justin Mend   . Sleep apnea    uses at bedtime- 2liters/min , negative- apnea., last study- 2/017  . Stroke ([place])   . TIA (transient ischemic attack)     Past Surgical History:  Procedure Laterality Date  . APPENDECTOMY    . cataract surgery bilateral    . COLON RESECTION  2010   Colostomy  . COLOSTOMY  2011   Reversal   . colostomy reversal    . ESOPHAGEAL DILATION    . ESOPHAGOGASTRODUODENOSCOPY N/A 01/17/2017   Procedure: ESOPHAGOGASTRODUODENOSCOPY (EGD);  Surgeon: Ladene Artist, MD;  Location: [facility] ENDOSCOPY;  Service: Endoscopy;  Laterality: N/A;  . IR GENERIC HISTORICAL  01/06/2016   IR RADIOLOGIST EVAL & MGMT 01/06/2016 Greggory Keen, MD [facility] INTERV RAD  . IR GENERIC HISTORICAL  01/20/2016   IR RADIOLOGIST EVAL & MGMT 01/20/2016  Sandi Mariscal, MD [facility] INTERV RAD  . IR GENERIC HISTORICAL  02/03/2016   IR RADIOLOGIST EVAL & MGMT 02/03/2016 Saverio Danker, PA-C [facility] INTERV RAD  . IR GENERIC HISTORICAL  02/24/2016   IR RADIOLOGIST EVAL & MGMT 02/24/2016 Corrie Mckusick, DO [facility] INTERV RAD  . IR GENERIC HISTORICAL  03/23/2016   IR RADIOLOGIST EVAL & MGMT 03/23/2016 Ardis Rowan, PA-C [facility] INTERV RAD  . IR GENERIC HISTORICAL  04/06/2016   IR RADIOLOGIST EVAL & MGMT 04/06/2016 Darrell K Allred, PA-C [facility] INTERV RAD  . IR GENERIC HISTORICAL  04/20/2016   IR RADIOLOGIST EVAL  & MGMT 04/20/2016 Greggory Keen, MD [facility] INTERV RAD  . IR RADIOLOGIST EVAL & MGMT  05/23/2016  . IR RADIOLOGIST EVAL & MGMT  05/03/2016  . KIDNEY TRANSPLANT  1984  . left arm surgery     due to injury, two plates in place   . PANCREATECTOMY N/A 12/07/2015   Procedure: HAND ASSISTED LAPAROSCOPIC DISTAL PANCREATECTOMY;  Surgeon: Stark Klein, MD;  Location: [place];  Service: General;  Laterality: N/A;  . PARATHYROIDECTOMY  11/04/2010   left inferior parathyroid      Inpatient Medications: Scheduled Meds: Lipitor 20mg  QD Diltiazem 60mg  q 6 hours Metoprolol 50mg  TID Eliquis 5mg  BID  And other non cardiac medication reviewed in paper chart Continuous Infusions:  PRN Meds:   Allergies:    Allergies  Allergen Reactions  . Amoxicillin Diarrhea    Has patient had a PCN reaction causing immediate rash, facial/tongue/throat swelling, SOB or lightheadedness with hypotension: No Has patient had a PCN reaction causing severe rash involving mucus membranes or skin necrosis: No Has patient had a PCN reaction that required hospitalization No Has patient had a PCN reaction occurring within the last 10 years: Yes If all of the above answers are "NO", then may proceed with Cephalosporin use.   . Tape Rash    Social History:   Social History   Socioeconomic History  . Marital status: Widowed    Spouse name: Not on file  . Number of children: 2  . Years of education: 29  . Highest education level: Not on file  Social Needs  . Financial resource strain: Not on file  . Food insecurity - worry: Not on file  . Food insecurity - inability: Not on file  . Transportation needs - medical: Not on file  . Transportation needs - non-medical: Not on file  Occupational History  . Occupation: Retired  Tobacco Use  . Smoking status: Former Smoker    Packs/day: 1.00    Years: 5.00    Pack years: 5.00    Types: Cigarettes    Last attempt to quit: 05/30/1989    Years since quitting: 27.6  .  Smokeless tobacco: Never Used  Substance and Sexual Activity  . Alcohol use: No    Alcohol/week: 0.0 oz  . Drug use: No  . Sexual activity: Not on file  Other Topics Concern  . Not on file  Social History Narrative   Patient is widowed with 2 children   Patient is right handed   Patient has a high school education   Patient drinks 3 cups daily       Family History:    Family History  Problem Relation Age of Onset  . Lung cancer Sister   . Heart disease Mother   . Stroke Father   . Aneurysm Brother   . Colon cancer Maternal Aunt 57  . Cancer Unknown   .  Esophageal cancer Neg Hx   . Rectal cancer Neg Hx   . Stomach cancer Neg Hx   . Breast cancer Neg Hx      ROS:  Please see the history of present illness.  ROS  All other ROS reviewed and negative.     Physical Exam/Data:  Vital sign 16:00 on Dec 14th, 2018 HT 122 BP 113/83 RR 23  General:  Chronically ill appearing female in Venti support HEENT: trachostomy Lymph: no adenopathy Neck: + JVD Endocrine:  No thryomegaly Vascular: No carotid bruits; FA pulses 2+ bilaterally without bruits  Cardiac:  normal S1, S2; Irregular irregular tachycardiac; no murmur  Lungs: faint rales bibasilar  Abd: soft, nontender, no hepatomegaly  Ext: no edema Musculoskeletal:  No deformities, BUE and BLE strength normal and equal Skin: warm and dry  Neuro:  venti support., delirium  Psych:  Delirium   EKG:  The EKG 01/19/17  was personally reviewed and demonstrates:  afib at rate of 109 bpm Telemetry:  Telemetry was personally reviewed and demonstrates:  afib at rapid ventricular rate  Relevant CV Studies:   Echo 01/04/17 Study Conclusions  - Left ventricle: The cavity size was normal. Systolic function was   normal. The estimated ejection fraction was in the range of 55%   to 60%. - Aortic valve: There was trivial regurgitation. - Left atrium: The atrium was moderately dilated. - Right ventricle: The cavity size was  mildly dilated. Wall   thickness was normal. - Right atrium: The atrium was moderately dilated. - Pulmonary arteries: PA peak pressure: 39 mm Hg (S).  Laboratory Data:  Chemistry Recent Labs  Lab 01/29/17 0543  01/30/17 0630 01/31/17 0741 02/01/17 0920  NA 134*  --   --  137 135  K 5.3*   < > 2.9* 4.0 4.2  CL 104  --   --  100* 100*  CO2 18*  --   --  29 28  GLUCOSE 136*  --   --  163* 174*  BUN 47*  --   --  37* 41*  CREATININE 1.02*  --   --  0.95 1.05*  CALCIUM 8.4*  --   --  8.8* 8.9  GFRNONAA 54*  --   --  58* 52*  GFRAA >60  --   --  >60 60*  ANIONGAP 12  --   --  8 7   < > = values in this interval not displayed.    No results for input(s): PROT, ALBUMIN, AST, ALT, ALKPHOS, BILITOT in the last 168 hours. Hematology Recent Labs  Lab 01/29/17 0543 01/31/17 0741 02/01/17 0920  WBC 9.8 7.8 8.3  RBC 2.57* 2.36* 2.53*  HGB 7.8* 7.1* 7.5*  HCT 23.8* 22.1* 23.4*  MCV 92.6 93.6 92.5  MCH 30.4 30.1 29.6  MCHC 32.8 32.1 32.1  RDW 15.8* 15.9* 15.6*  PLT 140* 188 248    Lab Results  Component Value Date   TSH 1.625 01/05/2017   Radiology/Studies:  No results found.  Assessment and Plan:   1. Atrial fibrillation with RVR - She remained in a-fib with RVR despite IV cardizem and BB-> rate primarily driven by VDRF, anemia and hypotension while on floor. - Her rate was well controlled on po medication since yesterday at long term care facility. Now rate improving on IV Cardizem to 120s. Likely respirotory status and anxiety driving elevated heart rate.  -Continue Eliquis for anticoagulation. - BP stable. Continue IV cardizem and PO metoprolol. Transition of  po cardizem when rate stable.   2.Chronic diastolic CHF - Volume status relatively stable. Give PRN lasix.   3. Acute on Chronic Hypoxic Respiratory Failure 2nd to HCAP/Aspiration PN S/pTracheostomy  - Per pulmonary   4. AMS - Per primary team  5. CKD with history of remote kidney transplant - Scr stable.    6. Anemia - Hgb of 7.5. Per primary team  For questions or updates, please contact [place] Please consult [email or web address] for contact info under Cardiology/STEMI.   Jarrett Soho, [place]  02/02/2017 3:53 PM    Patient seen and examined. Agree with assessment and plan.  Morgan Cooper is a 71 year old female who has a history of hypertension, diastolic heart failure, hyperlipidemia, renal transplant with immunosuppressive therapy, remote history of prior TIA/stroke as well .  Has atrial fibrillation.  She recently had been treated with amlodipine and metoprolol 75 mg twice a day for hypertension. .  She had noticed increasing heart rate and palpitations which led to a 30 day event monitor which revealed atrial fibrillation.  Metoprolol dose was subsequently increased to 100 mg twice a day and she was started on eliquis anticoagulation therapy.  One month ago, on 01/03/2017.  She was admitted for acute hypoxic respiratory failure in the setting of pneumonia and developed atrial fibrillation with RVR.  Her hospital course was complicated by prolonged intubation with mechanical ventilation, encephalopathy and delirium.  She ultimately required tracheostomy.  An echo Doppler study on 01/04/2017 showed an ejection fraction at 55-60%.  There was moderate left and right atrial dilatation with mild pulmonary hypertension with a peak pressure 39 mm.  She was admitted to [facility] on 01/25/2017 and has been on Cardizem 60 mg every 6 hours and metoprolol 50 mg every 8 hours.  She developed recurrent rapid ventricular response with rate up to 140s yesterday, treated with normal saline.  She was started on IV Cardizem and received a 10 mg bolus and 10 mg/h drip.  Her ventricular rate is now improved to approximately 105 to 115 range. .  There is no chest pain.  She has been getting oral metoprolol via NG tube.  Blood pressure has been recently on the low side ranging from 44-967 systolic, precluding  further med titration.  She denies any associated chest tightness.  JVD is approximately 78 cm.  There was no wheezing on lung exam.  Her rhythm was irregularly irregular and tachycardic at 105.  Abdomen is soft and nontender.  There was no edema.  Her skin was wondering dry.   Laboratory is notable for potassium of 2.9 on December 11, which has improved to 4.2 yesterday.  Magnesium is 2.0.  Most recent BUN 41, creatinine 1.05.  TSH 1.625 on November 11 .  She is significantly anemic with a hemoglobin of 7.5 and hematocrit of 23.4.  At present, recommend continuation of IV Cardizem drip and oral metoprolol at 50 mg every 8 hours.  If blood pressure allows and pulses still elevated metoprolol can be increased every 6 hours.  Once heart rate becomes controlled, can then transition to oral Cardizem at 60 mg every 6 hours with wean and discontinuance of IV Cardizem.   Troy Sine, MD, [facility] 02/02/2017 5:43 PM

## 2017-02-03 LAB — RENAL FUNCTION PANEL
ANION GAP: 10 (ref 5–15)
Albumin: 2.1 g/dL — ABNORMAL LOW (ref 3.5–5.0)
BUN: 33 mg/dL — ABNORMAL HIGH (ref 6–20)
CHLORIDE: 103 mmol/L (ref 101–111)
CO2: 24 mmol/L (ref 22–32)
Calcium: 8.7 mg/dL — ABNORMAL LOW (ref 8.9–10.3)
Creatinine, Ser: 0.96 mg/dL (ref 0.44–1.00)
GFR calc Af Amer: >60 mL/min (ref >60)
GFR calc non Af Amer: 58 mL/min — ABNORMAL LOW (ref >60)
GLUCOSE: 90 mg/dL (ref 65–99)
POTASSIUM: 4.5 mmol/L (ref 3.5–5.1)
Phosphorus: 3.4 mg/dL (ref 2.5–4.6)
Sodium: 137 mmol/L (ref 135–145)

## 2017-02-03 LAB — CBC
HEMATOCRIT: 22.2 % — AB (ref 36.0–46.0)
Hemoglobin: 7 g/dL — ABNORMAL LOW (ref 12.0–15.0)
MCH: 29.3 pg (ref 26.0–34.0)
MCHC: 31.5 g/dL (ref 30.0–36.0)
MCV: 92.9 fL (ref 78.0–100.0)
Platelets: 251 10*3/uL (ref 150–400)
RBC: 2.39 MIL/uL — ABNORMAL LOW (ref 3.87–5.11)
RDW: 15.5 % (ref 11.5–15.5)
WBC: 7.6 10*3/uL (ref 4.0–10.5)

## 2017-02-03 LAB — MAGNESIUM: Magnesium: 2.1 mg/dL (ref 1.7–2.4)

## 2017-02-03 LAB — C DIFFICILE QUICK SCREEN W PCR REFLEX
C DIFFICLE (CDIFF) ANTIGEN: NEGATIVE
C Diff interpretation: NOT DETECTED
C Diff toxin: NEGATIVE

## 2017-02-03 LAB — PREPARE RBC (CROSSMATCH)

## 2017-02-04 ENCOUNTER — Other Ambulatory Visit (HOSPITAL_COMMUNITY): Payer: Medicare Other

## 2017-02-04 DIAGNOSIS — J810 Acute pulmonary edema: Secondary | ICD-10-CM | POA: Diagnosis not present

## 2017-02-04 LAB — CBC
HEMATOCRIT: 27 % — AB (ref 36.0–46.0)
HEMOGLOBIN: 8.8 g/dL — AB (ref 12.0–15.0)
MCH: 29.6 pg (ref 26.0–34.0)
MCHC: 32.6 g/dL (ref 30.0–36.0)
MCV: 90.9 fL (ref 78.0–100.0)
Platelets: 304 10*3/uL (ref 150–400)
RBC: 2.97 MIL/uL — ABNORMAL LOW (ref 3.87–5.11)
RDW: 15.2 % (ref 11.5–15.5)
WBC: 8.6 10*3/uL (ref 4.0–10.5)

## 2017-02-05 ENCOUNTER — Other Ambulatory Visit (HOSPITAL_COMMUNITY): Payer: Medicare Other

## 2017-02-05 DIAGNOSIS — Z4682 Encounter for fitting and adjustment of non-vascular catheter: Secondary | ICD-10-CM | POA: Diagnosis not present

## 2017-02-05 DIAGNOSIS — R140 Abdominal distension (gaseous): Secondary | ICD-10-CM | POA: Diagnosis not present

## 2017-02-05 DIAGNOSIS — J90 Pleural effusion, not elsewhere classified: Secondary | ICD-10-CM | POA: Diagnosis not present

## 2017-02-05 LAB — URINALYSIS, ROUTINE W REFLEX MICROSCOPIC
BILIRUBIN URINE: UNDETERMINED
Glucose, UA: UNDETERMINED mg/dL
Hgb urine dipstick: UNDETERMINED
KETONES UR: UNDETERMINED mg/dL
Leukocytes, UA: UNDETERMINED
NITRITE: UNDETERMINED
PROTEIN: UNDETERMINED mg/dL
Specific Gravity, Urine: UNDETERMINED (ref 1.005–1.030)
pH: UNDETERMINED (ref 5.0–8.0)

## 2017-02-05 LAB — BLOOD GAS, ARTERIAL
ACID-BASE EXCESS: 3.4 mmol/L — AB (ref 0.0–2.0)
Acid-Base Excess: 3 mmol/L — ABNORMAL HIGH (ref 0.0–2.0)
BICARBONATE: 27.4 mmol/L (ref 20.0–28.0)
Bicarbonate: 27.2 mmol/L (ref 20.0–28.0)
FIO2: 100
FIO2: 100
O2 SAT: 90.8 %
O2 Saturation: 80.1 %
PATIENT TEMPERATURE: 98.6
PATIENT TEMPERATURE: 98.6
PCO2 ART: 42.9 mmHg (ref 32.0–48.0)
PEEP/CPAP: 7 cmH2O
PEEP: 7 cmH2O
PH ART: 7.418 (ref 7.350–7.450)
PO2 ART: 45.5 mmHg — AB (ref 83.0–108.0)
PO2 ART: 61.6 mmHg — AB (ref 83.0–108.0)
PRESSURE CONTROL: 18 cmH2O
Pressure control: 16 cmH2O
RATE: 16 resp/min
RATE: 18 resp/min
pCO2 arterial: 42 mmHg (ref 32.0–48.0)
pH, Arterial: 7.431 (ref 7.350–7.450)

## 2017-02-05 LAB — BASIC METABOLIC PANEL
Anion gap: 9 (ref 5–15)
BUN: 33 mg/dL — AB (ref 6–20)
CHLORIDE: 99 mmol/L — AB (ref 101–111)
CO2: 27 mmol/L (ref 22–32)
Calcium: 9.2 mg/dL (ref 8.9–10.3)
Creatinine, Ser: 0.98 mg/dL (ref 0.44–1.00)
GFR calc Af Amer: >60 mL/min (ref >60)
GFR calc non Af Amer: 56 mL/min — ABNORMAL LOW (ref >60)
GLUCOSE: 155 mg/dL — AB (ref 65–99)
POTASSIUM: 4 mmol/L (ref 3.5–5.1)
Sodium: 135 mmol/L (ref 135–145)

## 2017-02-05 LAB — PHOSPHORUS: PHOSPHORUS: 2.9 mg/dL (ref 2.5–4.6)

## 2017-02-05 LAB — URINALYSIS, MICROSCOPIC (REFLEX): SQUAMOUS EPITHELIAL / LPF: NONE SEEN

## 2017-02-05 LAB — CBC
HEMATOCRIT: 27.1 % — AB (ref 36.0–46.0)
HEMOGLOBIN: 8.6 g/dL — AB (ref 12.0–15.0)
MCH: 28.9 pg (ref 26.0–34.0)
MCHC: 31.7 g/dL (ref 30.0–36.0)
MCV: 90.9 fL (ref 78.0–100.0)
Platelets: 338 10*3/uL (ref 150–400)
RBC: 2.98 MIL/uL — AB (ref 3.87–5.11)
RDW: 15.6 % — ABNORMAL HIGH (ref 11.5–15.5)
WBC: 12.5 10*3/uL — ABNORMAL HIGH (ref 4.0–10.5)

## 2017-02-05 LAB — MAGNESIUM: Magnesium: 1.9 mg/dL (ref 1.7–2.4)

## 2017-02-06 ENCOUNTER — Other Ambulatory Visit (HOSPITAL_COMMUNITY): Payer: Medicare Other

## 2017-02-06 LAB — BPAM RBC
BLOOD PRODUCT EXPIRATION DATE: 201901102359
Blood Product Expiration Date: 201901102359
ISSUE DATE / TIME: 201812151404
Unit Type and Rh: 8400
Unit Type and Rh: 8400

## 2017-02-06 LAB — URINE CULTURE: Culture: >=100000 — AB

## 2017-02-06 LAB — TYPE AND SCREEN
ABO/RH(D): AB POS
Antibody Screen: NEGATIVE
Unit division: 0
Unit division: 0

## 2017-02-07 LAB — CULTURE, RESPIRATORY W GRAM STAIN: Culture: NO GROWTH

## 2017-02-07 LAB — CULTURE, RESPIRATORY

## 2017-02-20 DEATH — deceased

## 2017-06-12 ENCOUNTER — Other Ambulatory Visit: Payer: Medicare Other
